# Patient Record
Sex: Male | Born: 1950 | Race: White | Hispanic: No | Marital: Single | Smoking: Former smoker
Health system: Southern US, Community
[De-identification: ages and names within clinical notes are randomized; demographics above are authoritative.]

## PROBLEM LIST (undated history)

## (undated) DIAGNOSIS — K449 Diaphragmatic hernia without obstruction or gangrene: Secondary | ICD-10-CM

## (undated) DIAGNOSIS — I639 Cerebral infarction, unspecified: Secondary | ICD-10-CM

## (undated) DIAGNOSIS — K219 Gastro-esophageal reflux disease without esophagitis: Secondary | ICD-10-CM

## (undated) HISTORY — PX: OTHER SURGICAL HISTORY: SHX169

## (undated) HISTORY — DX: Cerebral infarction, unspecified: I63.9

---

## 2009-03-26 ENCOUNTER — Emergency Department (HOSPITAL_COMMUNITY): Admission: EM | Admit: 2009-03-26 | Discharge: 2009-03-26 | Payer: Self-pay | Admitting: Emergency Medicine

## 2020-10-17 ENCOUNTER — Other Ambulatory Visit: Payer: Self-pay

## 2020-10-17 ENCOUNTER — Inpatient Hospital Stay: Payer: Medicare Other | Admitting: Anesthesiology

## 2020-10-17 ENCOUNTER — Inpatient Hospital Stay: Payer: Medicare Other

## 2020-10-17 ENCOUNTER — Encounter
Admission: EM | Disposition: A | Discharge status: Skilled Nursing Facility | Payer: Self-pay | Source: Home / Self Care | Attending: Student

## 2020-10-17 ENCOUNTER — Emergency Department: Payer: Medicare Other

## 2020-10-17 ENCOUNTER — Inpatient Hospital Stay
Admission: EM | Admit: 2020-10-17 | Discharge: 2020-10-22 | DRG: 481 | Disposition: A | Discharge status: Skilled Nursing Facility | Payer: Medicare Other | Attending: Student | Admitting: Student

## 2020-10-17 DIAGNOSIS — I248 Other forms of acute ischemic heart disease: Secondary | ICD-10-CM | POA: Diagnosis present

## 2020-10-17 DIAGNOSIS — M25552 Pain in left hip: Secondary | ICD-10-CM | POA: Diagnosis present

## 2020-10-17 DIAGNOSIS — D649 Anemia, unspecified: Secondary | ICD-10-CM | POA: Diagnosis present

## 2020-10-17 DIAGNOSIS — R7989 Other specified abnormal findings of blood chemistry: Secondary | ICD-10-CM | POA: Diagnosis present

## 2020-10-17 DIAGNOSIS — Z6828 Body mass index (BMI) 28.0-28.9, adult: Secondary | ICD-10-CM | POA: Diagnosis not present

## 2020-10-17 DIAGNOSIS — S72002A Fracture of unspecified part of neck of left femur, initial encounter for closed fracture: Secondary | ICD-10-CM | POA: Diagnosis present

## 2020-10-17 DIAGNOSIS — Y92524 Gas station as the place of occurrence of the external cause: Secondary | ICD-10-CM

## 2020-10-17 DIAGNOSIS — R778 Other specified abnormalities of plasma proteins: Secondary | ICD-10-CM | POA: Diagnosis present

## 2020-10-17 DIAGNOSIS — E663 Overweight: Secondary | ICD-10-CM | POA: Diagnosis present

## 2020-10-17 DIAGNOSIS — Z20822 Contact with and (suspected) exposure to covid-19: Secondary | ICD-10-CM | POA: Diagnosis present

## 2020-10-17 DIAGNOSIS — R030 Elevated blood-pressure reading, without diagnosis of hypertension: Secondary | ICD-10-CM | POA: Diagnosis present

## 2020-10-17 DIAGNOSIS — Z87891 Personal history of nicotine dependence: Secondary | ICD-10-CM

## 2020-10-17 DIAGNOSIS — Z419 Encounter for procedure for purposes other than remedying health state, unspecified: Secondary | ICD-10-CM

## 2020-10-17 DIAGNOSIS — R9431 Abnormal electrocardiogram [ECG] [EKG]: Secondary | ICD-10-CM | POA: Diagnosis present

## 2020-10-17 DIAGNOSIS — E559 Vitamin D deficiency, unspecified: Secondary | ICD-10-CM | POA: Diagnosis present

## 2020-10-17 DIAGNOSIS — W010XXA Fall on same level from slipping, tripping and stumbling without subsequent striking against object, initial encounter: Secondary | ICD-10-CM | POA: Diagnosis present

## 2020-10-17 DIAGNOSIS — S72142A Displaced intertrochanteric fracture of left femur, initial encounter for closed fracture: Principal | ICD-10-CM | POA: Diagnosis present

## 2020-10-17 DIAGNOSIS — K449 Diaphragmatic hernia without obstruction or gangrene: Secondary | ICD-10-CM | POA: Diagnosis present

## 2020-10-17 DIAGNOSIS — G4700 Insomnia, unspecified: Secondary | ICD-10-CM | POA: Diagnosis not present

## 2020-10-17 DIAGNOSIS — K219 Gastro-esophageal reflux disease without esophagitis: Secondary | ICD-10-CM | POA: Diagnosis present

## 2020-10-17 DIAGNOSIS — W19XXXA Unspecified fall, initial encounter: Secondary | ICD-10-CM | POA: Diagnosis present

## 2020-10-17 HISTORY — PX: INTRAMEDULLARY (IM) NAIL INTERTROCHANTERIC: SHX5875

## 2020-10-17 HISTORY — DX: Diaphragmatic hernia without obstruction or gangrene: K44.9

## 2020-10-17 HISTORY — DX: Gastro-esophageal reflux disease without esophagitis: K21.9

## 2020-10-17 LAB — COMPREHENSIVE METABOLIC PANEL
ALT: 13 U/L (ref 0–44)
AST: 19 U/L (ref 15–41)
Albumin: 3.8 g/dL (ref 3.5–5.0)
Alkaline Phosphatase: 54 U/L (ref 38–126)
Anion gap: 7 (ref 5–15)
BUN: 18 mg/dL (ref 8–23)
CO2: 23 mmol/L (ref 22–32)
Calcium: 8.8 mg/dL — ABNORMAL LOW (ref 8.9–10.3)
Chloride: 109 mmol/L (ref 98–111)
Creatinine, Ser: 1.02 mg/dL (ref 0.61–1.24)
GFR, Estimated: >60 mL/min (ref >60)
Glucose, Bld: 147 mg/dL — ABNORMAL HIGH (ref 70–99)
Potassium: 3.6 mmol/L (ref 3.5–5.1)
Sodium: 139 mmol/L (ref 135–145)
Total Bilirubin: 0.9 mg/dL (ref 0.3–1.2)
Total Protein: 7.1 g/dL (ref 6.5–8.1)

## 2020-10-17 LAB — CBC WITH DIFFERENTIAL/PLATELET
Abs Immature Granulocytes: 0.02 10*3/uL (ref 0.00–0.07)
Basophils Absolute: 0 10*3/uL (ref 0.0–0.1)
Basophils Relative: 0 %
Eosinophils Absolute: 0 10*3/uL (ref 0.0–0.5)
Eosinophils Relative: 1 %
HCT: 33.7 % — ABNORMAL LOW (ref 39.0–52.0)
Hemoglobin: 10.2 g/dL — ABNORMAL LOW (ref 13.0–17.0)
Immature Granulocytes: 0 %
Lymphocytes Relative: 22 %
Lymphs Abs: 1.4 10*3/uL (ref 0.7–4.0)
MCH: 24.1 pg — ABNORMAL LOW (ref 26.0–34.0)
MCHC: 30.3 g/dL (ref 30.0–36.0)
MCV: 79.7 fL — ABNORMAL LOW (ref 80.0–100.0)
Monocytes Absolute: 0.4 10*3/uL (ref 0.1–1.0)
Monocytes Relative: 5 %
Neutro Abs: 4.8 10*3/uL (ref 1.7–7.7)
Neutrophils Relative %: 72 %
Platelets: 378 10*3/uL (ref 150–400)
RBC: 4.23 MIL/uL (ref 4.22–5.81)
RDW: 16.5 % — ABNORMAL HIGH (ref 11.5–15.5)
WBC: 6.6 10*3/uL (ref 4.0–10.5)
nRBC: 0 % (ref 0.0–0.2)

## 2020-10-17 LAB — URINALYSIS, COMPLETE (UACMP) WITH MICROSCOPIC
Bacteria, UA: NONE SEEN
Bilirubin Urine: NEGATIVE
Glucose, UA: NEGATIVE mg/dL
Hgb urine dipstick: NEGATIVE
Ketones, ur: NEGATIVE mg/dL
Leukocytes,Ua: NEGATIVE
Nitrite: NEGATIVE
Protein, ur: NEGATIVE mg/dL
Specific Gravity, Urine: 1.023 (ref 1.005–1.030)
pH: 5 (ref 5.0–8.0)

## 2020-10-17 LAB — RETICULOCYTES
Immature Retic Fract: 9.9 % (ref 2.3–15.9)
RBC.: 5.1 MIL/uL (ref 4.22–5.81)
Retic Count, Absolute: 78.3 10*3/uL (ref 19.0–186.0)
Retic Ct Pct: 1.5 % (ref 0.4–3.1)

## 2020-10-17 LAB — TROPONIN I (HIGH SENSITIVITY)
Troponin I (High Sensitivity): 32 ng/L — ABNORMAL HIGH (ref <18)
Troponin I (High Sensitivity): 33 ng/L — ABNORMAL HIGH (ref <18)

## 2020-10-17 LAB — IRON AND TIBC
Iron: 45 ug/dL (ref 45–182)
Saturation Ratios: 16 % — ABNORMAL LOW (ref 17.9–39.5)
TIBC: 281 ug/dL (ref 250–450)
UIBC: 236 ug/dL

## 2020-10-17 LAB — FOLATE: Folate: 10.4 ng/mL (ref >5.9)

## 2020-10-17 LAB — RESP PANEL BY RT-PCR (FLU A&B, COVID) ARPGX2
Influenza A by PCR: NEGATIVE
Influenza B by PCR: NEGATIVE
SARS Coronavirus 2 by RT PCR: NEGATIVE

## 2020-10-17 LAB — SAMPLE TO BLOOD BANK

## 2020-10-17 LAB — PROTIME-INR
INR: 1.1 (ref 0.8–1.2)
Prothrombin Time: 13.3 seconds (ref 11.4–15.2)

## 2020-10-17 LAB — CREATININE, SERUM
Creatinine, Ser: 1 mg/dL (ref 0.61–1.24)
GFR, Estimated: >60 mL/min

## 2020-10-17 LAB — FERRITIN: Ferritin: 168 ng/mL (ref 24–336)

## 2020-10-17 LAB — MAGNESIUM: Magnesium: 2.3 mg/dL (ref 1.7–2.4)

## 2020-10-17 IMAGING — CR DG HIP (WITH OR WITHOUT PELVIS) 2-3V*L*
1 series · 3 of 3 positions shown · non-contrast
Comparison: None.

CLINICAL DATA: Fall, left hip pain

EXAM:
DG HIP (WITH OR WITHOUT PELVIS) 2-3V LEFT

[Series 1: dg hip unilat w or w/o pelvis 2-3 views  · non-contrast · 0.14mm/px · 3 of 3 slices shown]
[im 1/3]
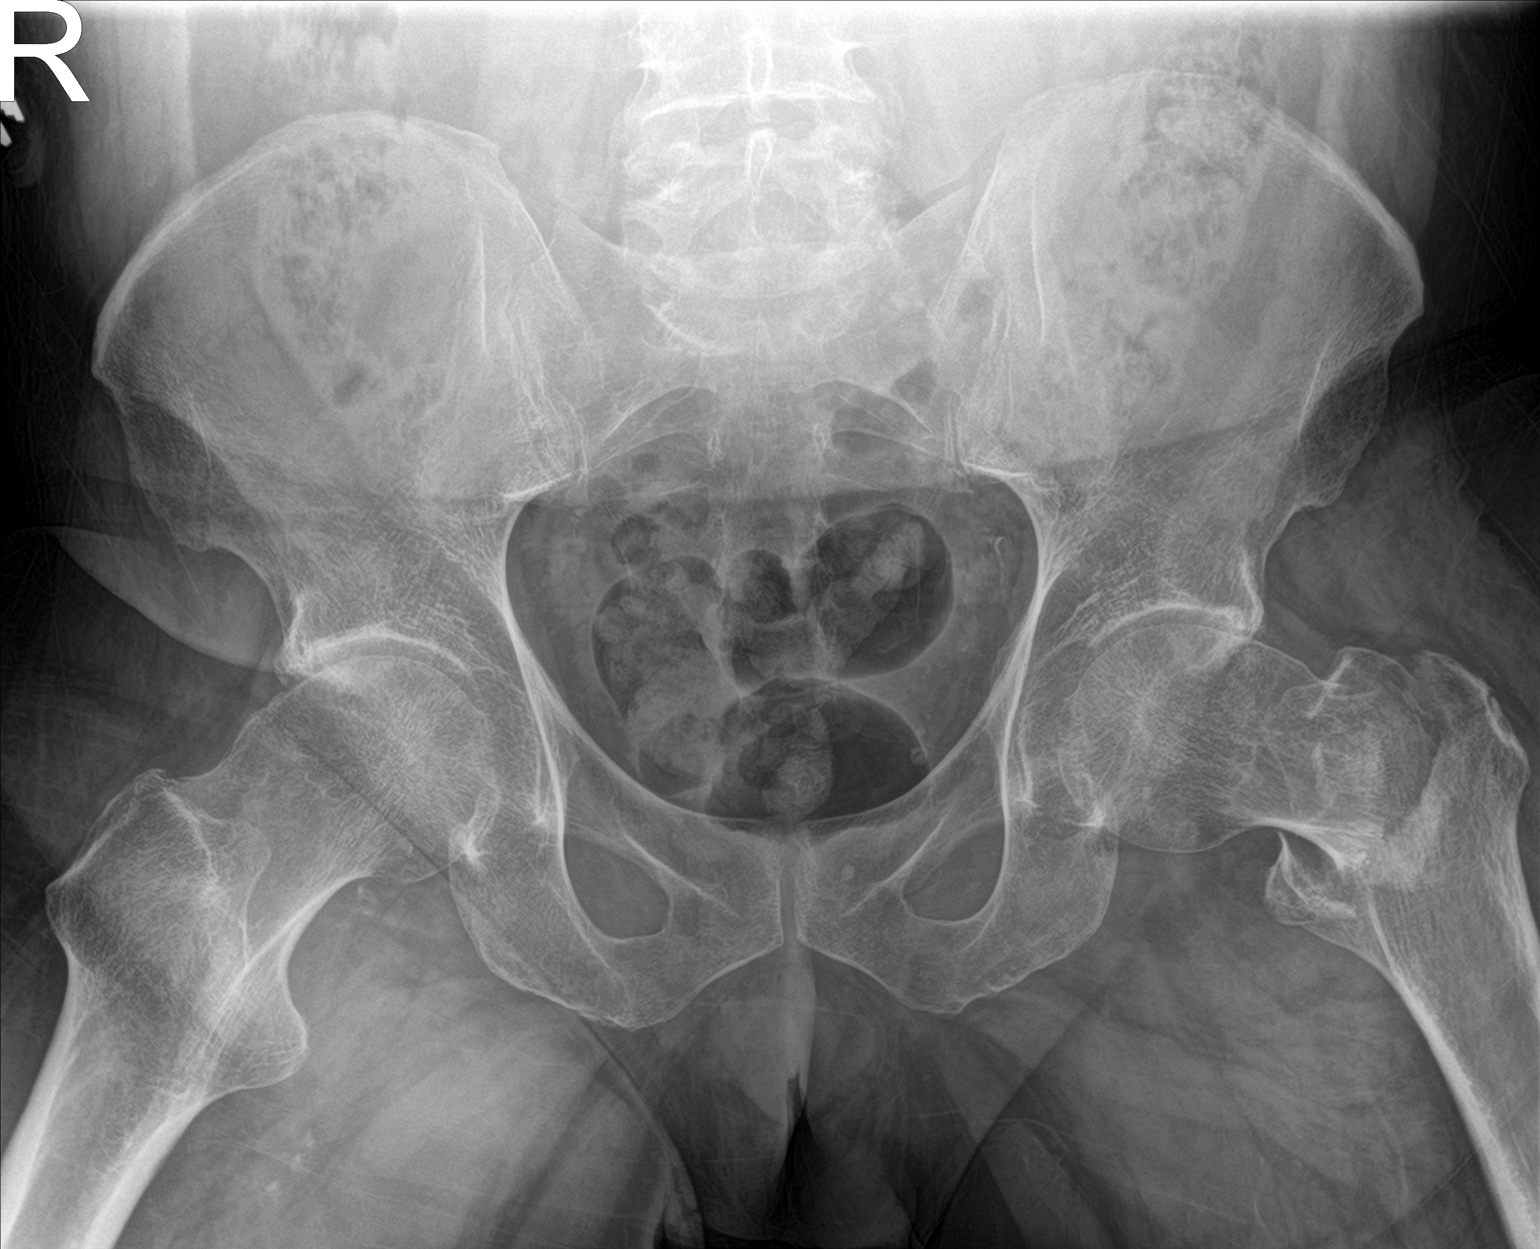
[im 2/3]
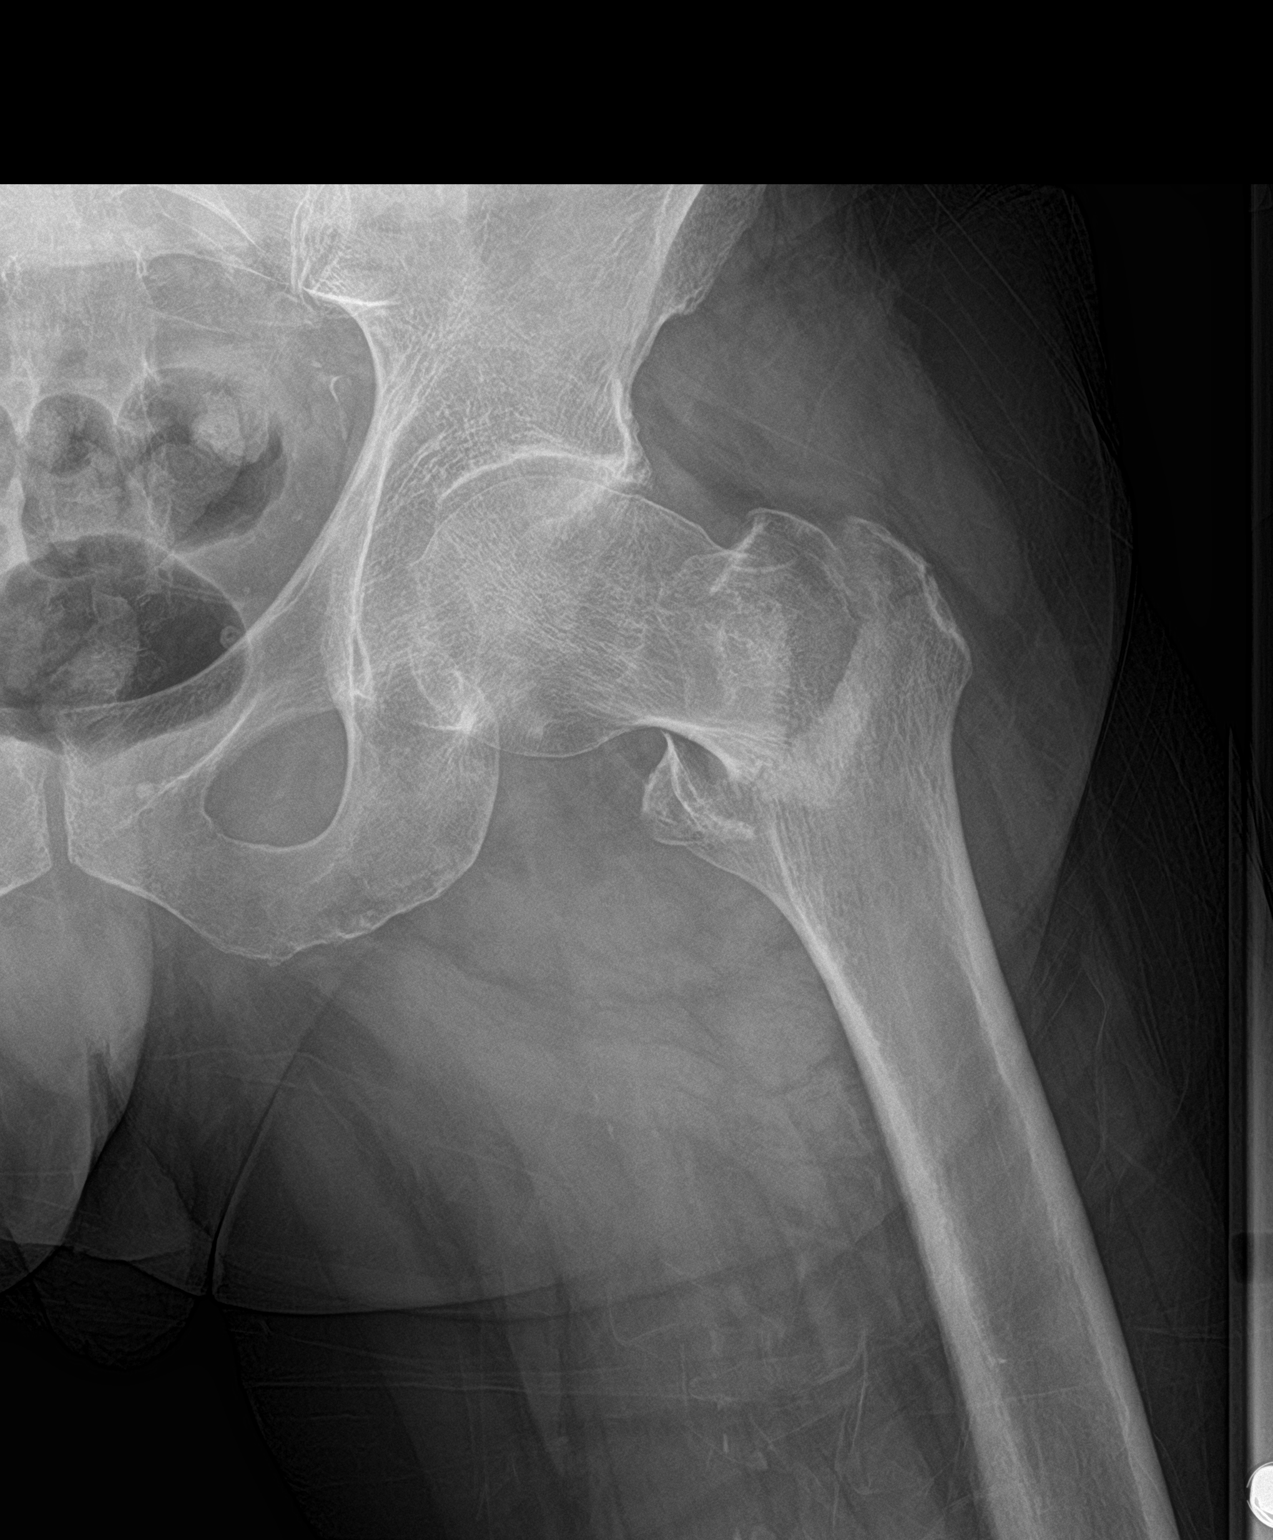
[im 3/3]
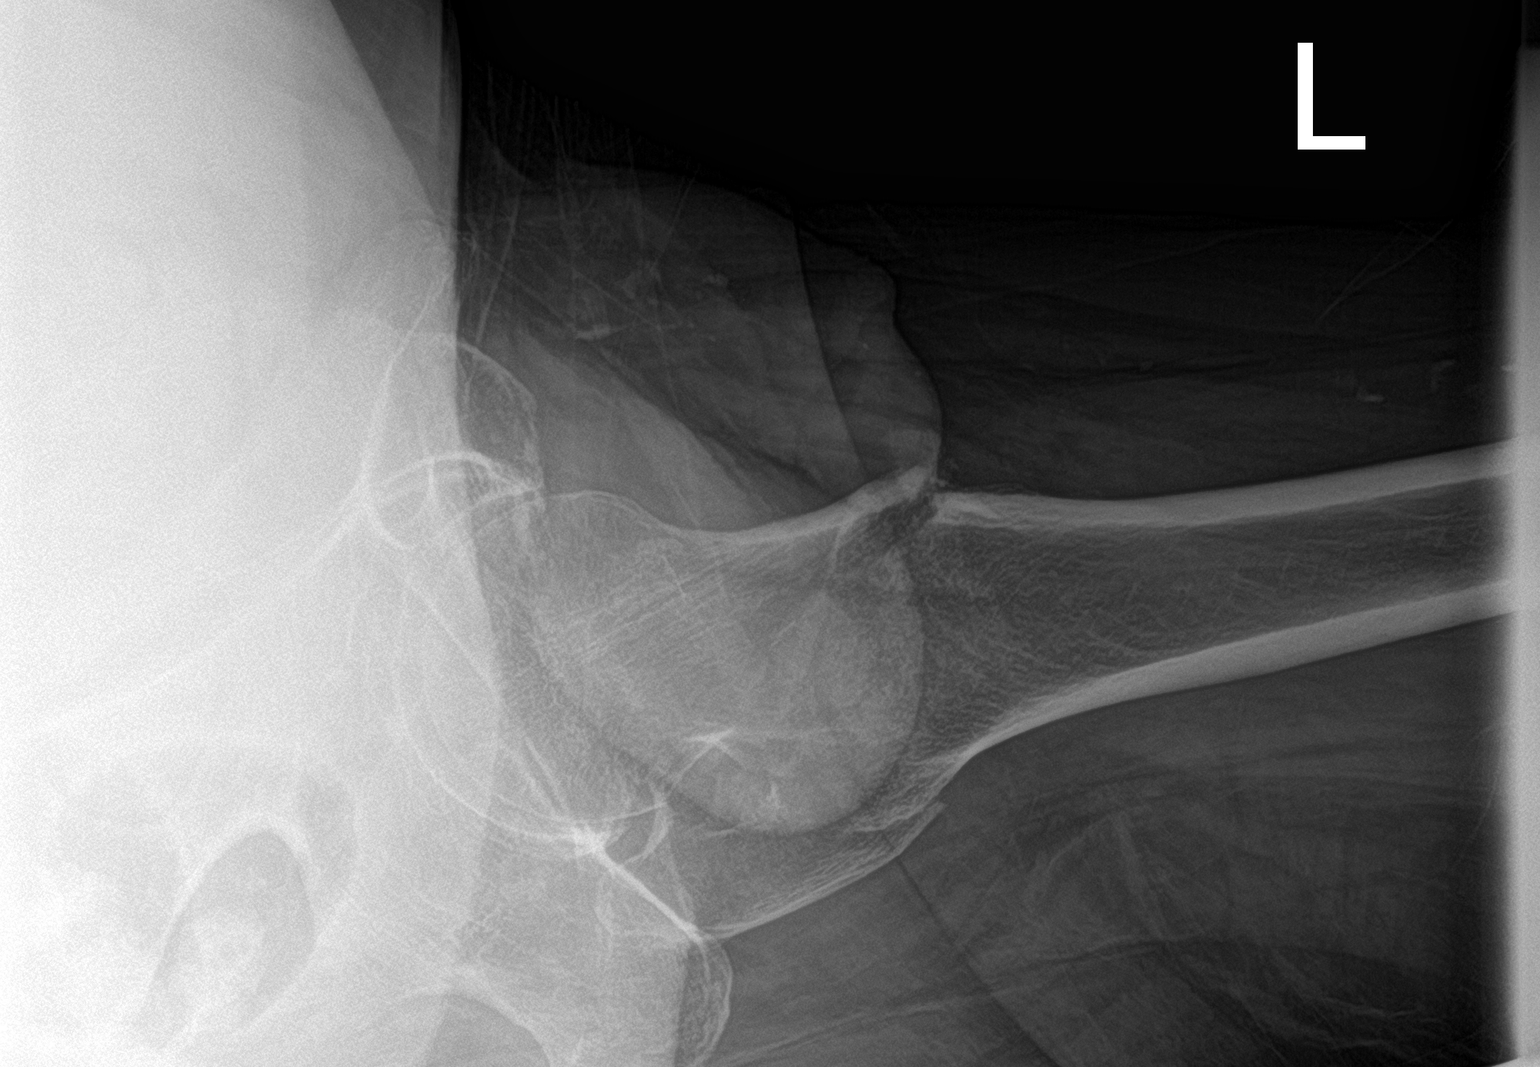

[3 of 3 positions shown; findings below may reference images not displayed]

FINDINGS: There is a left femoral intertrochanteric fracture with varus
angulation. No subluxation or dislocation. Mild degenerative changes
in the hip joints bilaterally.
IMPRESSION: Mildly angulated left femoral intertrochanteric fracture.

## 2020-10-17 IMAGING — RF DG C-ARM 1-60 MIN
1 series · 3 of 3 positions shown · non-contrast
Comparison: None.

CLINICAL DATA: LEFT IM nail

EXAM:
LEFT FEMUR 2 VIEWS; DG C-ARM 1-60 MIN

[Series 1: unknown protocol · 0.14mm/px · 3 of 3 slices shown]
[im 1/3]
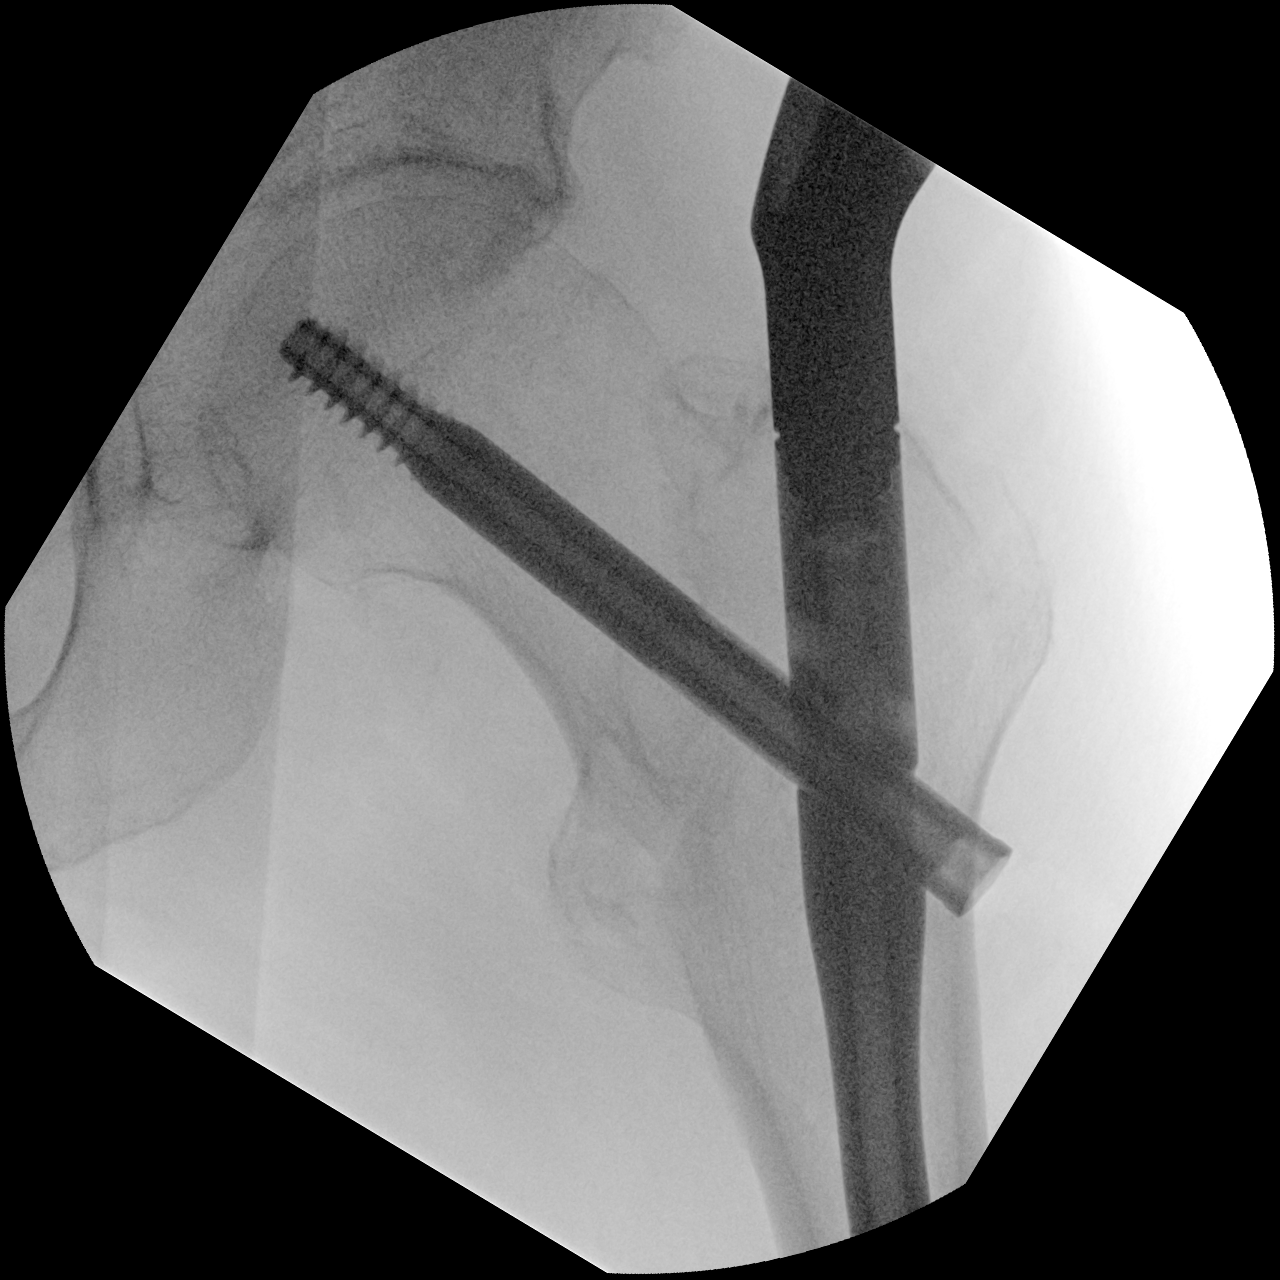
[im 2/3]
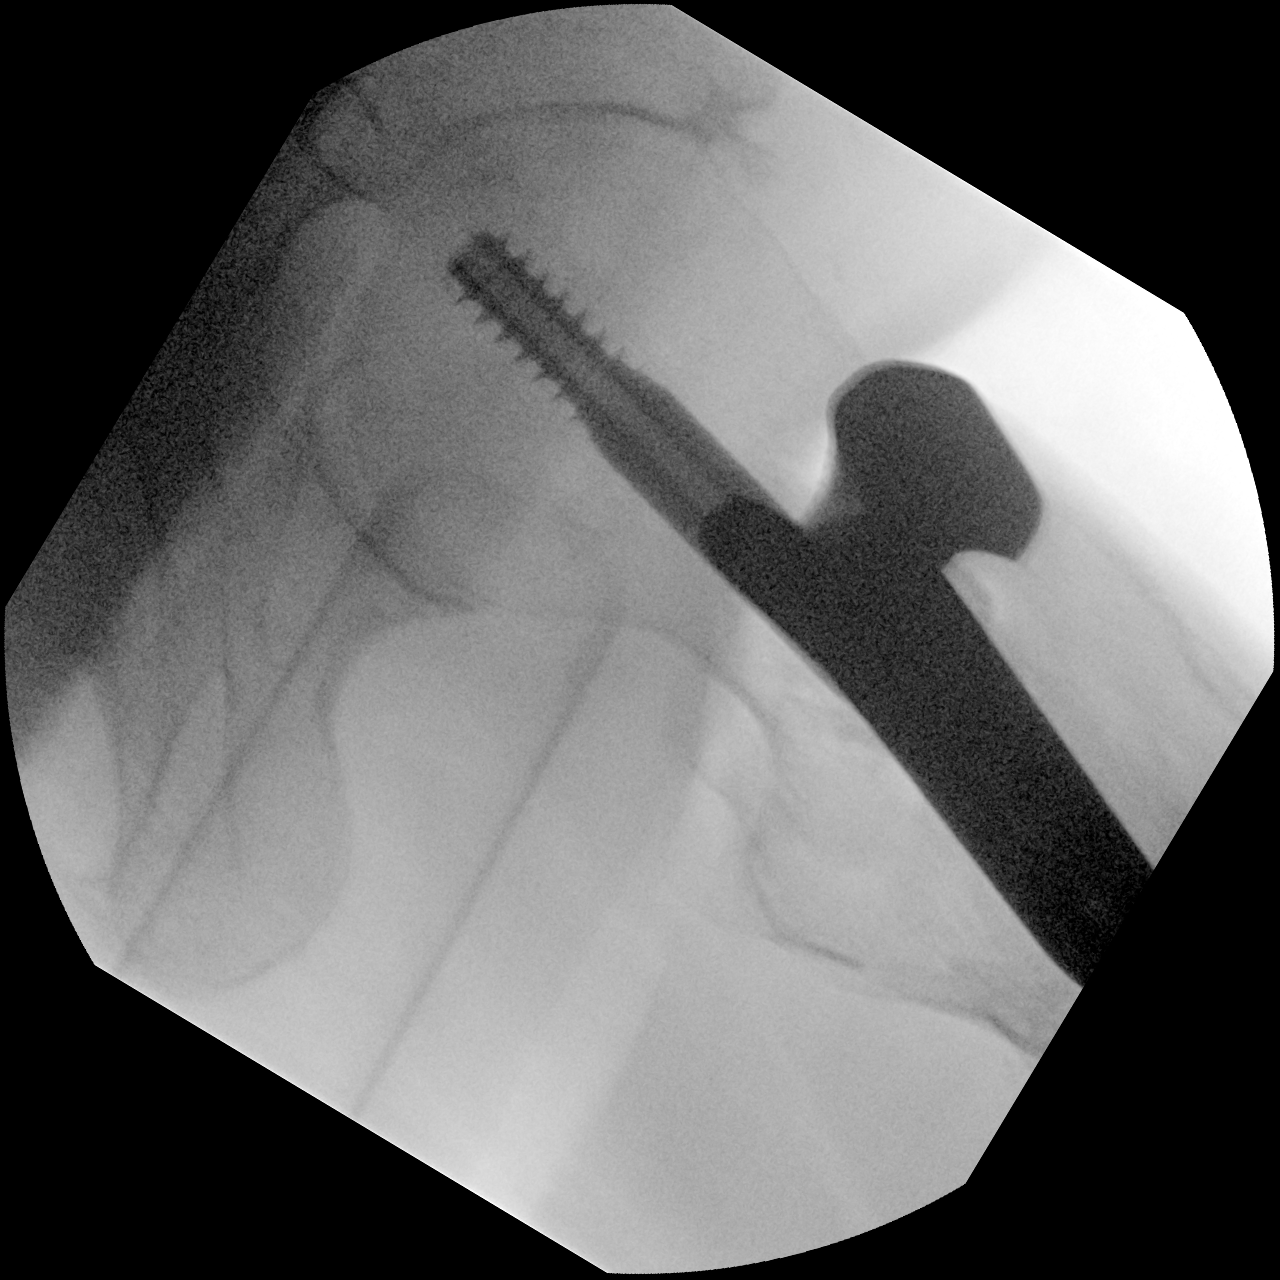
[im 3/3]
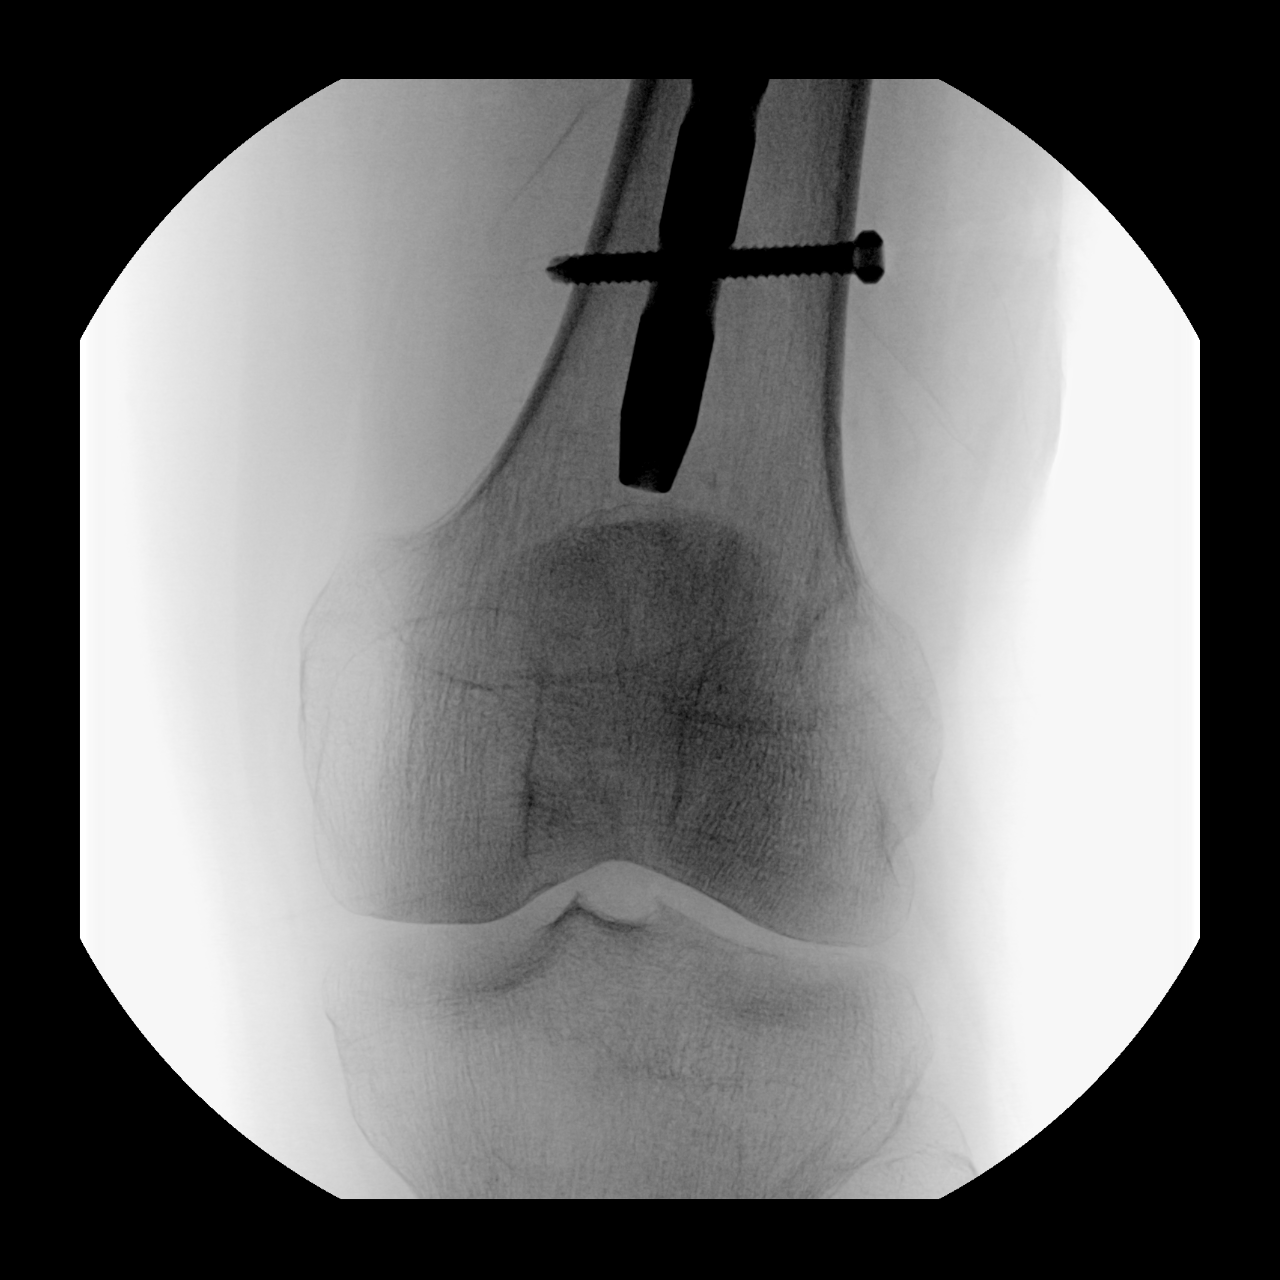

[3 of 3 positions shown; findings below may reference images not displayed]

FINDINGS: Intraoperative fluoroscopic spot images of the LEFT femur are
provided. IM nail appears appropriately positioned. Fluoroscopy
provided for 45 seconds.
IMPRESSION: Intraoperative fluoroscopic spot images demonstrating placement of
an intramedullary nail. No evidence of surgical complicating
feature.

## 2020-10-17 IMAGING — RF DG FEMUR 2+V*L*
1 series · 3 of 3 positions shown · non-contrast
Comparison: None.

CLINICAL DATA: LEFT IM nail

EXAM:
LEFT FEMUR 2 VIEWS; DG C-ARM 1-60 MIN

[Series 1: unknown protocol · 0.14mm/px · 3 of 3 slices shown]
[im 1/3]
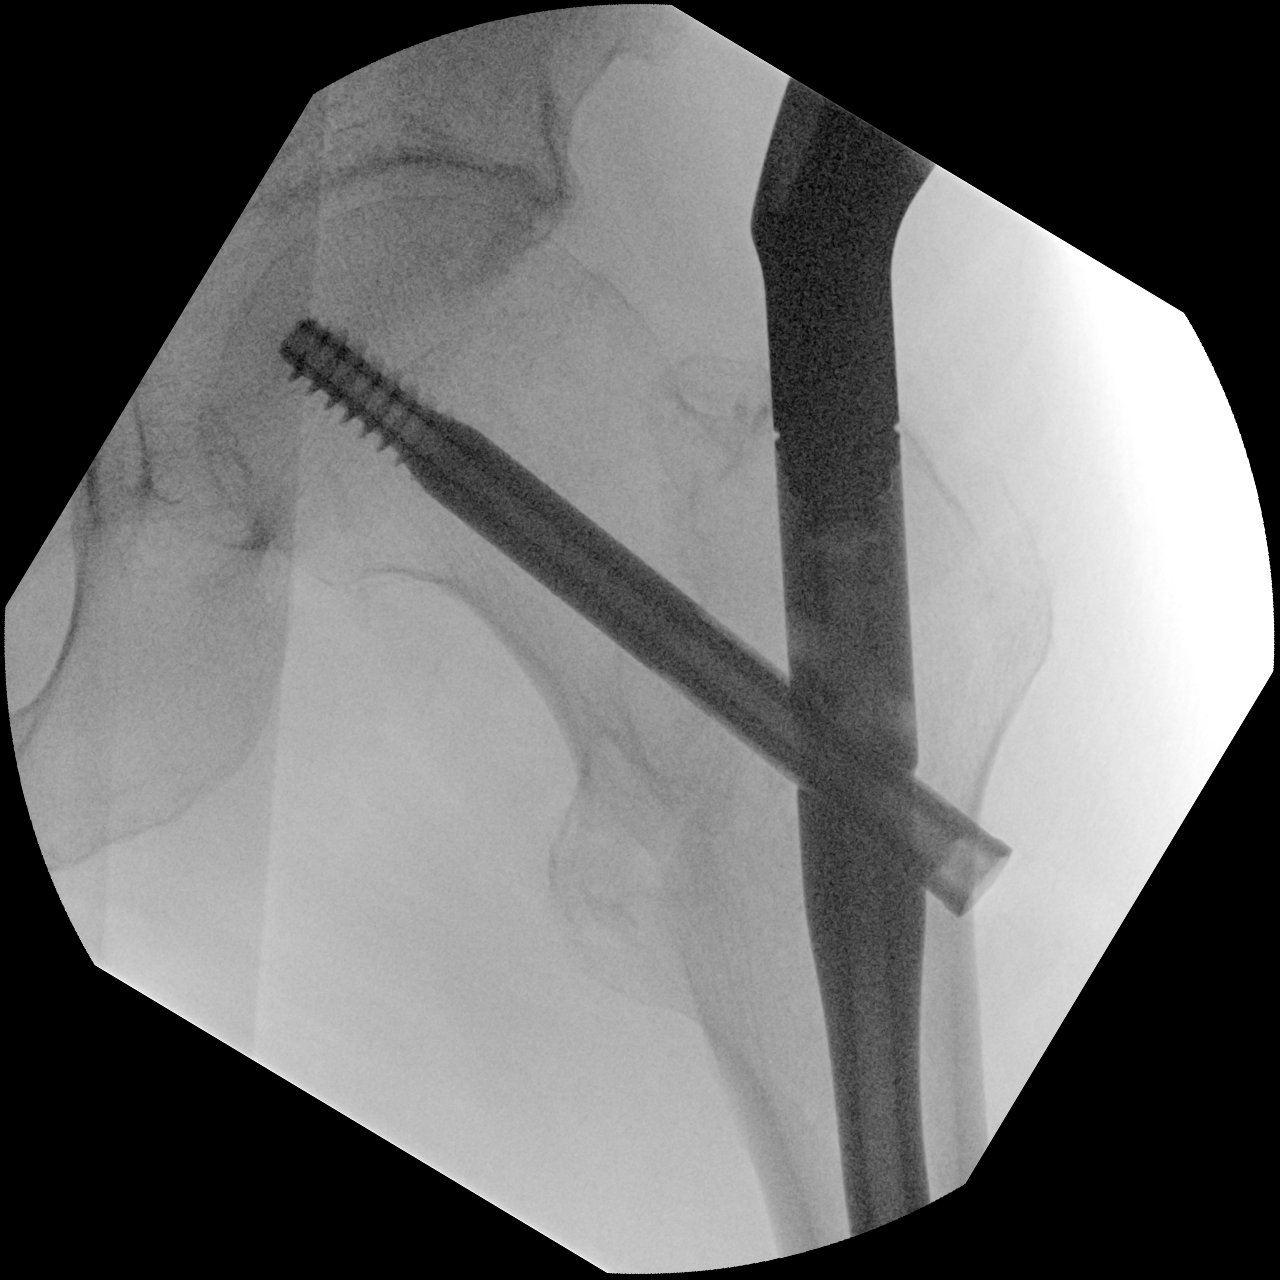
[im 2/3]
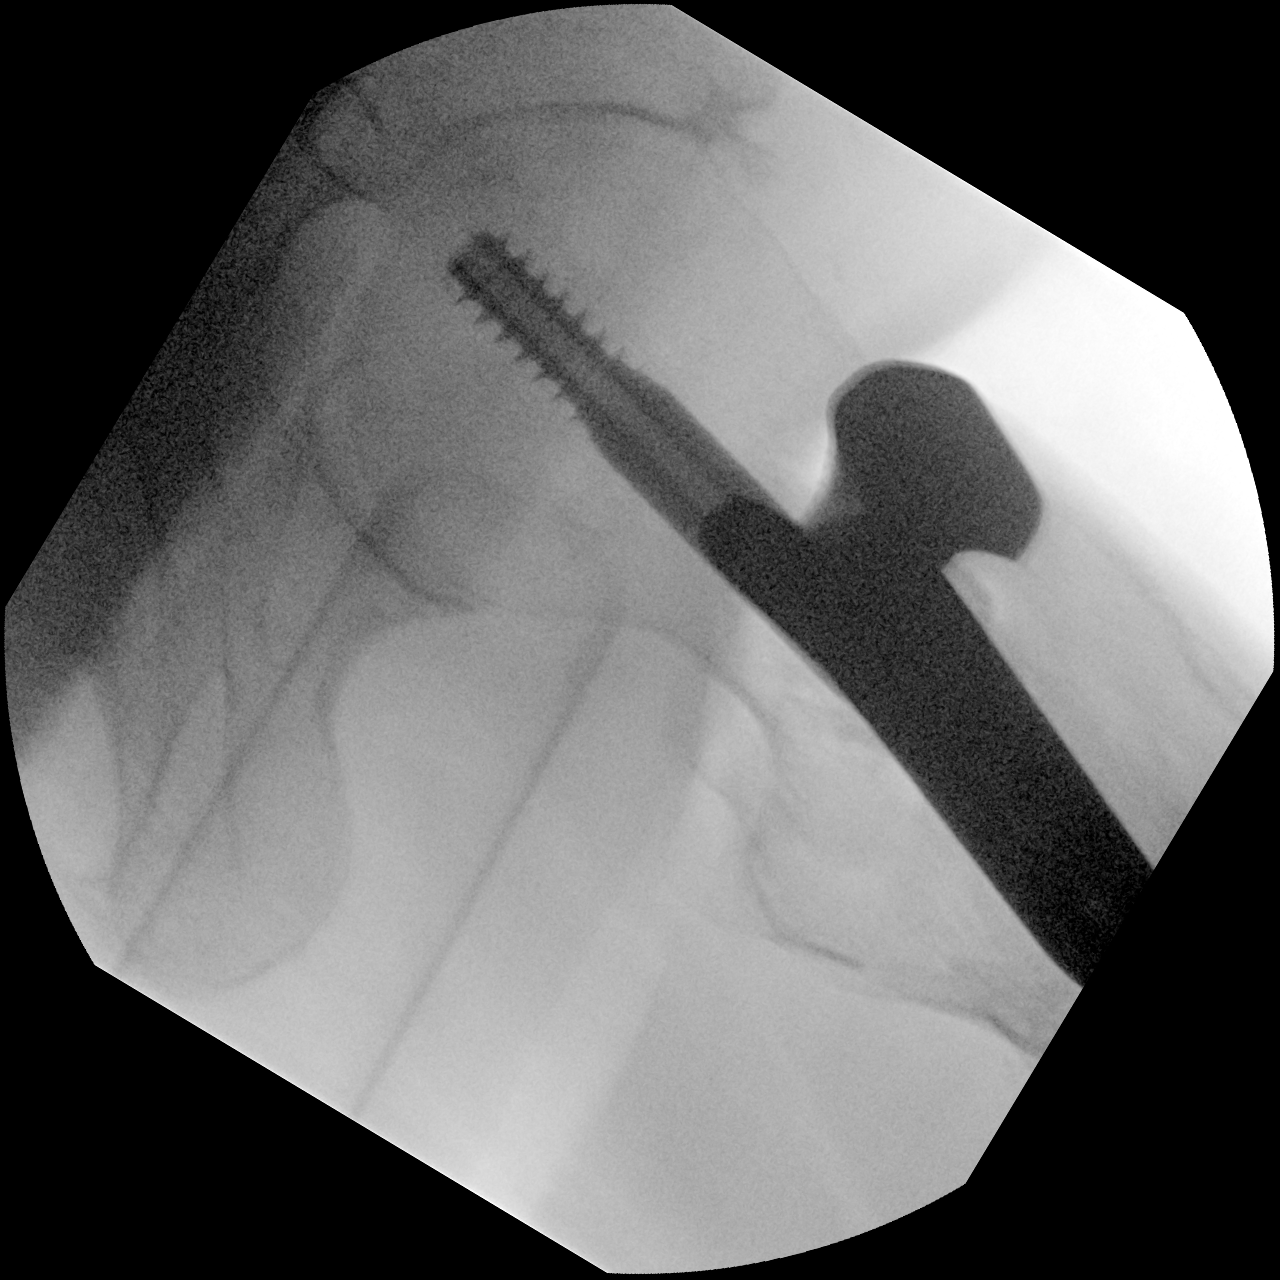
[im 3/3]
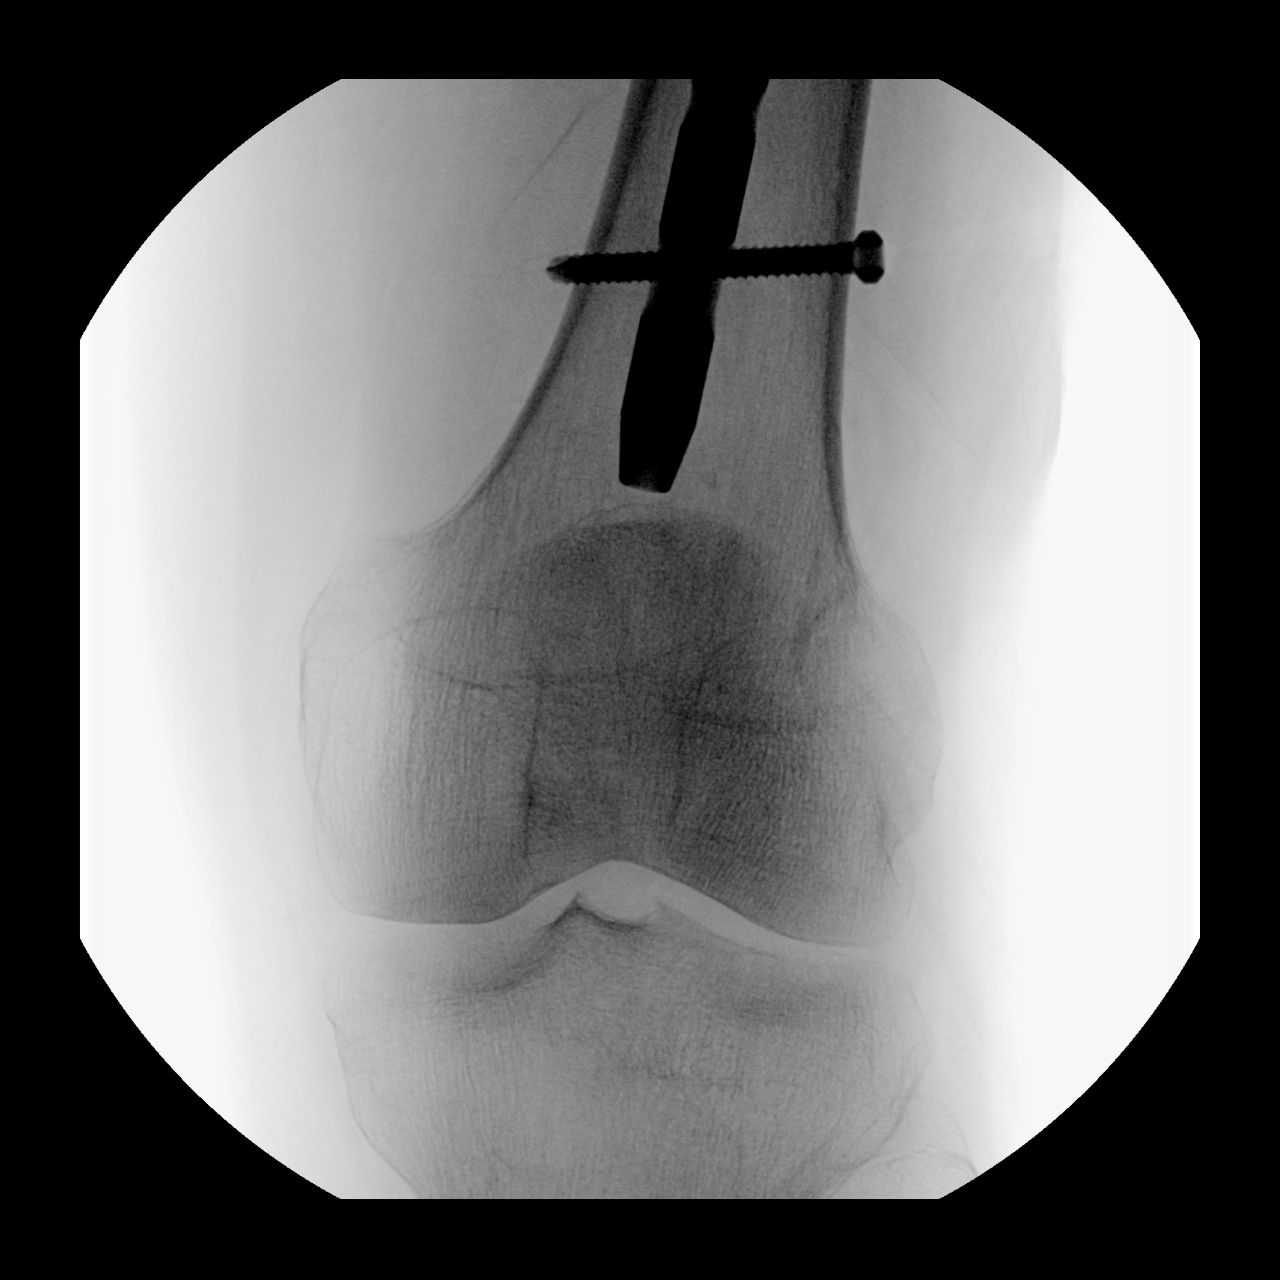

[3 of 3 positions shown; findings below may reference images not displayed]

FINDINGS: Intraoperative fluoroscopic spot images of the LEFT femur are
provided. IM nail appears appropriately positioned. Fluoroscopy
provided for 45 seconds.
IMPRESSION: Intraoperative fluoroscopic spot images demonstrating placement of
an intramedullary nail. No evidence of surgical complicating
feature.

## 2020-10-17 SURGERY — FIXATION, FRACTURE, INTERTROCHANTERIC, WITH INTRAMEDULLARY ROD
Anesthesia: Spinal | Site: Hip | Laterality: Left

## 2020-10-17 MED ORDER — HYDROMORPHONE HCL 1 MG/ML IJ SOLN
0.5000 mg | Freq: Once | INTRAMUSCULAR | Status: AC
  Administered 2020-10-17: 0.5 mg via INTRAVENOUS

## 2020-10-17 MED ORDER — ONDANSETRON HCL 4 MG/2ML IJ SOLN
4.0000 mg | Freq: Once | INTRAMUSCULAR | Status: AC
  Administered 2020-10-17: 4 mg via INTRAVENOUS
  Filled 2020-10-17: qty 2

## 2020-10-17 MED ORDER — ZOLPIDEM TARTRATE 5 MG PO TABS
5.0000 mg | ORAL_TABLET | Freq: Every evening | ORAL | Status: DC | PRN
  Administered 2020-10-18 – 2020-10-21 (×5): 5 mg via ORAL
  Filled 2020-10-17 (×5): qty 1

## 2020-10-17 MED ORDER — METHOCARBAMOL 500 MG PO TABS: 500.0000 mg | ORAL_TABLET | Freq: Four times a day (QID) | ORAL | Status: DC | PRN

## 2020-10-17 MED ORDER — FENTANYL CITRATE (PF) 100 MCG/2ML IJ SOLN: 25.0000 ug | INTRAMUSCULAR | Status: DC | PRN

## 2020-10-17 MED ORDER — PROPOFOL 500 MG/50ML IV EMUL
INTRAVENOUS | Status: DC | PRN
  Administered 2020-10-17: 50 ug/kg/min via INTRAVENOUS

## 2020-10-17 MED ORDER — MAGNESIUM CITRATE PO SOLN
1.0000 | Freq: Once | ORAL | Status: DC | PRN
  Filled 2020-10-17: qty 296

## 2020-10-17 MED ORDER — MIDAZOLAM HCL 2 MG/2ML IJ SOLN
INTRAMUSCULAR | Status: AC
  Filled 2020-10-17: qty 2

## 2020-10-17 MED ORDER — BISACODYL 10 MG RE SUPP: 10.0000 mg | Freq: Every day | RECTAL | Status: DC | PRN

## 2020-10-17 MED ORDER — MENTHOL 3 MG MT LOZG
1.0000 | LOZENGE | OROMUCOSAL | Status: DC | PRN
  Filled 2020-10-17: qty 9

## 2020-10-17 MED ORDER — DOCUSATE SODIUM 100 MG PO CAPS
100.0000 mg | ORAL_CAPSULE | Freq: Two times a day (BID) | ORAL | Status: DC
  Administered 2020-10-17 – 2020-10-20 (×7): 100 mg via ORAL
  Filled 2020-10-17 (×8): qty 1

## 2020-10-17 MED ORDER — MORPHINE SULFATE (PF) 2 MG/ML IV SOLN: 0.5000 mg | INTRAVENOUS | Status: DC | PRN

## 2020-10-17 MED ORDER — BUPIVACAINE HCL (PF) 0.5 % IJ SOLN
INTRAMUSCULAR | Status: DC | PRN
  Administered 2020-10-17: 2.5 mL

## 2020-10-17 MED ORDER — FENTANYL CITRATE (PF) 100 MCG/2ML IJ SOLN
25.0000 ug | INTRAMUSCULAR | Status: DC | PRN
  Administered 2020-10-17: 25 ug via INTRAVENOUS
  Filled 2020-10-17: qty 2

## 2020-10-17 MED ORDER — CEFAZOLIN SODIUM-DEXTROSE 2-4 GM/100ML-% IV SOLN
INTRAVENOUS | Status: AC
  Filled 2020-10-17: qty 100

## 2020-10-17 MED ORDER — ALUM & MAG HYDROXIDE-SIMETH 200-200-20 MG/5ML PO SUSP: 30.0000 mL | ORAL | Status: DC | PRN

## 2020-10-17 MED ORDER — SODIUM CHLORIDE 0.9 % IV SOLN: 12.5000 mg/h | INTRAVENOUS | Status: DC | PRN

## 2020-10-17 MED ORDER — PANTOPRAZOLE SODIUM 40 MG IV SOLR
40.0000 mg | INTRAVENOUS | Status: DC
  Administered 2020-10-18: 40 mg via INTRAVENOUS
  Filled 2020-10-17: qty 40

## 2020-10-17 MED ORDER — HYDROMORPHONE HCL 1 MG/ML IJ SOLN
0.5000 mg | Freq: Once | INTRAMUSCULAR | Status: DC
  Filled 2020-10-17: qty 1

## 2020-10-17 MED ORDER — LACTATED RINGERS IV SOLN: INTRAVENOUS | Status: DC | PRN

## 2020-10-17 MED ORDER — METHOCARBAMOL 1000 MG/10ML IJ SOLN
500.0000 mg | Freq: Four times a day (QID) | INTRAVENOUS | Status: DC | PRN
  Filled 2020-10-17: qty 5

## 2020-10-17 MED ORDER — HYDROCODONE-ACETAMINOPHEN 5-325 MG PO TABS
1.0000 | ORAL_TABLET | ORAL | Status: DC | PRN
  Administered 2020-10-18 (×3): 1 via ORAL
  Filled 2020-10-17 (×4): qty 1

## 2020-10-17 MED ORDER — PHENOL 1.4 % MT LIQD
1.0000 | OROMUCOSAL | Status: DC | PRN
  Filled 2020-10-17: qty 177

## 2020-10-17 MED ORDER — SODIUM CHLORIDE 0.9 % IV SOLN: INTRAVENOUS | Status: DC

## 2020-10-17 MED ORDER — SODIUM CHLORIDE 0.9 % IV SOLN: INTRAVENOUS | Status: AC

## 2020-10-17 MED ORDER — ENOXAPARIN SODIUM 40 MG/0.4ML ~~LOC~~ SOLN
40.0000 mg | SUBCUTANEOUS | Status: DC
  Administered 2020-10-18 – 2020-10-22 (×5): 40 mg via SUBCUTANEOUS
  Filled 2020-10-17 (×5): qty 0.4

## 2020-10-17 MED ORDER — HYDROCODONE-ACETAMINOPHEN 7.5-325 MG PO TABS
1.0000 | ORAL_TABLET | ORAL | Status: DC | PRN
  Administered 2020-10-21 – 2020-10-22 (×2): 1 via ORAL
  Filled 2020-10-17 (×2): qty 1

## 2020-10-17 MED ORDER — NEOMYCIN-POLYMYXIN B GU 40-200000 IR SOLN
Status: DC | PRN
  Administered 2020-10-17: 2 mL

## 2020-10-17 MED ORDER — MAGNESIUM HYDROXIDE 400 MG/5ML PO SUSP: 30.0000 mL | Freq: Every day | ORAL | Status: DC | PRN

## 2020-10-17 MED ORDER — LORAZEPAM 2 MG/ML IJ SOLN: 0.5000 mg | Freq: Four times a day (QID) | INTRAMUSCULAR | Status: DC | PRN

## 2020-10-17 MED ORDER — PROPOFOL 10 MG/ML IV BOLUS
INTRAVENOUS | Status: DC | PRN
  Administered 2020-10-17: 20 mg via INTRAVENOUS

## 2020-10-17 MED ORDER — CEFAZOLIN SODIUM-DEXTROSE 2-4 GM/100ML-% IV SOLN
2.0000 g | Freq: Once | INTRAVENOUS | Status: AC
  Administered 2020-10-17: 2 g via INTRAVENOUS
  Filled 2020-10-17: qty 100

## 2020-10-17 MED ORDER — PROPOFOL 500 MG/50ML IV EMUL
INTRAVENOUS | Status: AC
  Filled 2020-10-17: qty 50

## 2020-10-17 MED ORDER — SODIUM CHLORIDE 0.9 % IV SOLN
INTRAVENOUS | Status: DC | PRN
  Administered 2020-10-17: 50 ug/min via INTRAVENOUS

## 2020-10-17 MED ORDER — CEFAZOLIN SODIUM-DEXTROSE 2-4 GM/100ML-% IV SOLN
2.0000 g | Freq: Four times a day (QID) | INTRAVENOUS | Status: AC
Start: 2020-10-17 — End: 2020-10-18
  Administered 2020-10-17 – 2020-10-18 (×3): 2 g via INTRAVENOUS
  Filled 2020-10-17 (×3): qty 100

## 2020-10-17 MED ORDER — ACETAMINOPHEN 325 MG PO TABS
325.0000 mg | ORAL_TABLET | Freq: Four times a day (QID) | ORAL | Status: DC | PRN
  Administered 2020-10-20: 650 mg via ORAL
  Filled 2020-10-17 (×2): qty 2

## 2020-10-17 MED ORDER — MIDAZOLAM HCL 5 MG/5ML IJ SOLN
INTRAMUSCULAR | Status: DC | PRN
  Administered 2020-10-17: 2 mg via INTRAVENOUS

## 2020-10-17 SURGICAL SUPPLY — 33 items
BIT DRILL 4.3MMS DISTAL GRDTED (BIT) ×1 IMPLANT
BNDG COHESIVE 4X5 TAN STRL (GAUZE/BANDAGES/DRESSINGS) ×4 IMPLANT
CHLORAPREP W/TINT 26 (MISCELLANEOUS) ×2 IMPLANT
COVER WAND RF STERILE (DRAPES) ×2 IMPLANT
DRAPE 3/4 80X56 (DRAPES) ×2 IMPLANT
DRAPE U-SHAPE 47X51 STRL (DRAPES) ×2 IMPLANT
DRILL 4.3MMS DISTAL GRADUATED (BIT) ×2
DRSG OPSITE POSTOP 3X4 (GAUZE/BANDAGES/DRESSINGS) ×4 IMPLANT
DRSG OPSITE POSTOP 4X6 (GAUZE/BANDAGES/DRESSINGS) ×2 IMPLANT
GLOVE SURG SYN 9.0  PF PI (GLOVE) ×1
GLOVE SURG SYN 9.0 PF PI (GLOVE) ×1 IMPLANT
GLOVE SURG UNDER POLY LF SZ9 (GLOVE) ×2 IMPLANT
GOWN SRG 2XL LVL 4 RGLN SLV (GOWNS) ×1 IMPLANT
GOWN STRL NON-REIN 2XL LVL4 (GOWNS) ×1
GOWN STRL REUS W/ TWL LRG LVL3 (GOWN DISPOSABLE) ×1 IMPLANT
GOWN STRL REUS W/TWL LRG LVL3 (GOWN DISPOSABLE) ×1
GUIDEPIN VERSANAIL DSP 3.2X444 (ORTHOPEDIC DISPOSABLE SUPPLIES) ×2 IMPLANT
GUIDEWIRE BALL NOSE 100CM (WIRE) ×2 IMPLANT
HFN LH 130 DEG 11MM X 380MM (Orthopedic Implant) ×2 IMPLANT
KIT TURNOVER KIT A (KITS) ×2 IMPLANT
MANIFOLD NEPTUNE II (INSTRUMENTS) ×2 IMPLANT
MAT ABSORB  FLUID 56X50 GRAY (MISCELLANEOUS) ×1
MAT ABSORB FLUID 56X50 GRAY (MISCELLANEOUS) ×1 IMPLANT
NEEDLE FILTER BLUNT 18X 1/2SAF (NEEDLE) ×1
NEEDLE FILTER BLUNT 18X1 1/2 (NEEDLE) ×1 IMPLANT
NS IRRIG 500ML POUR BTL (IV SOLUTION) ×2 IMPLANT
PACK HIP COMPR (MISCELLANEOUS) ×2 IMPLANT
SCALPEL PROTECTED #15 DISP (BLADE) ×4 IMPLANT
STAPLER SKIN PROX 35W (STAPLE) ×2 IMPLANT
SUT VIC AB 1 CT1 36 (SUTURE) ×2 IMPLANT
SUT VIC AB 2-0 CT1 (SUTURE) ×2 IMPLANT
SYR 10ML LL (SYRINGE) ×2 IMPLANT
SYR BULB IRRIG 60ML STRL (SYRINGE) ×2 IMPLANT

## 2020-10-17 NOTE — Progress Notes (Signed)
Spinal is slow to wear off. Has moved down to L5 with sensation to upper thigh. Has been in recovery over 2 hours. Dr Pernell Dupre notified and is okay with transfer back to floor room.

## 2020-10-17 NOTE — Anesthesia Procedure Notes (Signed)
Spinal  Patient location during procedure: OR Start time: 10/17/2020 5:06 PM End time: 10/17/2020 5:14 PM Staffing Performed: resident/CRNA  Resident/CRNA: Nelda Marseille, CRNA Preanesthetic Checklist Completed: patient identified, IV checked, site marked, risks and benefits discussed, surgical consent, monitors and equipment checked, pre-op evaluation and timeout performed Spinal Block Patient position: sitting Prep: Betadine Patient monitoring: heart rate, continuous pulse ox, blood pressure and cardiac monitor Approach: midline Location: L3-4 Injection technique: single-shot Needle Needle type: Whitacre and Introducer  Needle gauge: 25 G Needle length: 9 cm Assessment Sensory level: T10 Additional Notes Negative paresthesia. Negative blood return. Positive free-flowing CSF. Expiration date of kit checked and confirmed. Patient tolerated procedure well, without complications.

## 2020-10-17 NOTE — ED Provider Notes (Signed)
[facility] Emergency Department Provider Note   ____________________________________________   Event Date/Time   First MD Initiated Contact with Patient 10/17/20 1007     (approximate)  I have reviewed the triage vital signs and the nursing notes.   HISTORY  Chief Complaint Fall    HPI Casey Rangel is a 70 y.o. male patient presents with left hip pain secondary to a trip and fall at [employer].  Patient denies LOC or head injury.  Patient state unable to bear weight on the left lower extremity.  Patient denies loss of sensation.  Rates pain as a 10/10.  No palliative measure prior to arrival.  Patient refused pain medication at this time.         History reviewed. No pertinent past medical history.  There are no problems to display for this patient.   History reviewed. No pertinent surgical history.  Prior to Admission medications   Not on File    Allergies Patient has no allergy information on record.  No family history on file.  Social History    Review of Systems Constitutional: No fever/chills Eyes: No visual changes. ENT: No sore throat. Cardiovascular: Denies chest pain. Respiratory: Denies shortness of breath. Gastrointestinal: No abdominal pain.  No nausea, no vomiting.  No diarrhea.  No constipation. Genitourinary: Negative for dysuria. Musculoskeletal: Left hip pain. Skin: Negative for rash. Neurological: Negative for headaches, focal weakness or numbness.  ____________________________________________   PHYSICAL EXAM:  VITAL SIGNS: ED Triage Vitals  Enc Vitals Group     BP 10/17/20 0957 (!) 146/100     Pulse Rate 10/17/20 0957 85     Resp 10/17/20 0957 18     Temp 10/17/20 0957 98 F (36.7 C)     Temp src --      SpO2 10/17/20 0957 100 %     Weight --      Height --      Head Circumference --      Peak Flow --      Pain Score 10/17/20 0955 10     Pain Loc --      Pain Edu? --      Excl. in GC? --      Constitutional: Alert and oriented. Well appearing and in no acute distress. Eyes: Conjunctivae are normal. PERRL. EOMI. Head: Atraumatic. Nose: No congestion/rhinnorhea. Mouth/Throat: Mucous membranes are moist.  Oropharynx non-erythematous. Neck: No stridor.  No cervical spine tenderness to palpation. Hematological/Lymphatic/Immunilogical: No cervical lymphadenopathy. Cardiovascular: Normal rate, regular rhythm. Grossly normal heart sounds.  Good peripheral circulation.  Abated blood pressure. Respiratory: Normal respiratory effort.  No retractions. Lungs CTAB. Gastrointestinal: Soft and nontender. No distention. No abdominal bruits. No CVA tenderness. Genitourinary: Deferred Musculoskeletal: No obvious deformity.  Moderate guarding palpation of the greater trochanter.  Range of motion deferred secondary to complaint of pain.  Patient is able to move his ankle foot and toes. Neurologic:  Normal speech and language. No gross focal neurologic deficits are appreciated. No gait instability. Skin:  Skin is warm, dry and intact. No rash noted.  No abrasion or ecchymosis. Psychiatric: Mood and affect are normal. Speech and behavior are normal.  ____________________________________________   LABS (all labs ordered are listed, but only abnormal results are displayed)  Labs Reviewed  COMPREHENSIVE METABOLIC PANEL - Abnormal; Notable for the following components:      Result Value   Glucose, Bld 147 (*)    Calcium 8.8 (*)    All other components  within normal limits  CBC WITH DIFFERENTIAL/PLATELET - Abnormal; Notable for the following components:   Hemoglobin 10.2 (*)    HCT 33.7 (*)    MCV 79.7 (*)    MCH 24.1 (*)    RDW 16.5 (*)    All other components within normal limits  TROPONIN I (HIGH SENSITIVITY) - Abnormal; Notable for the following components:   Troponin I (High Sensitivity) 32 (*)    All other components within normal limits  PROTIME-INR  URINALYSIS, COMPLETE (UACMP)  WITH MICROSCOPIC  POC SARS CORONAVIRUS 2 AG -  ED  SAMPLE TO BLOOD BANK   ____________________________________________  EKG  Read by heart station Dr. ____________________________________________  RADIOLOGY I, Joni Reining, personally viewed and evaluated these images (plain radiographs) as part of my medical decision making, as well as reviewing the written report by the radiologist.  ED MD interpretation: Left intertrochanter fracture with mild displacement.  Official radiology report(s): DG Chest 1 View  Result Date: 10/17/2020 CLINICAL DATA:  Fall.  Left hip pain EXAM: CHEST  1 VIEW COMPARISON:  None. FINDINGS: The heart size and mediastinal contours are within normal limits. Both lungs are clear. The visualized skeletal structures are unremarkable. IMPRESSION: No active disease. Electronically Signed   By: Marlan Palau M.D.   On: 10/17/2020 11:04   DG Hip Unilat With Pelvis 2-3 Views Left  Result Date: 10/17/2020 CLINICAL DATA:  Fall, left hip pain EXAM: DG HIP (WITH OR WITHOUT PELVIS) 2-3V LEFT COMPARISON:  None. FINDINGS: There is a left femoral intertrochanteric fracture with varus angulation. No subluxation or dislocation. Mild degenerative changes in the hip joints bilaterally. IMPRESSION: Mildly angulated left femoral intertrochanteric fracture. Electronically Signed   By: Charlett Nose M.D.   On: 10/17/2020 11:03    ____________________________________________   PROCEDURES  Procedure(s) performed (including Critical Care):  Procedures   ____________________________________________   INITIAL IMPRESSION / ASSESSMENT AND PLAN / ED COURSE  As part of my medical decision making, I reviewed the following data within the electronic MEDICAL RECORD NUMBER         Patient presents with left hip pain and nonweightbearing secondary to a trip and fall.  Discussed x-ray findings with patient showing at trochanter fracture of the left hip.  Discussed patient with on-call  orthopedic who will admit and assume care.   ____________________________________________   FINAL CLINICAL IMPRESSION(S) / ED DIAGNOSES  Final diagnoses:  Closed left hip fracture, initial encounter [facility])     ED Discharge Orders    None      *Please note:  Casey Mabe was evaluated in Emergency Department on 10/17/2020 for the symptoms described in the history of present illness. He was evaluated in the context of the global COVID-19 pandemic, which necessitated consideration that the patient might be at risk for infection with the SARS-CoV-2 virus that causes COVID-19. Institutional protocols and algorithms that pertain to the evaluation of patients at risk for COVID-19 are in a state of rapid change based on information released by regulatory bodies including the CDC and federal and state organizations. These policies and algorithms were followed during the patient's care in the ED.  Some ED evaluations and interventions may be delayed as a result of limited staffing during and the pandemic.*   Note:  This document was prepared using Dragon voice recognition software and may include unintentional dictation errors.    Joni Reining, PA-C 10/17/20 1140    Willy Eddy, MD 10/17/20 [phone]

## 2020-10-17 NOTE — H&P (Signed)
History and Physical    PLEASE NOTE THAT DRAGON DICTATION SOFTWARE WAS USED IN THE CONSTRUCTION OF THIS NOTE.   Casey Rangel ZOX:[id number] DOB: [birth year] DOA: 10/17/2020  PCP: Patient, No Pcp Per Patient coming from: home   I have personally briefly reviewed patient's old medical records in [facility]  Chief Complaint: left hip pain  HPI: Casey Rangel is a 70 y.o. male with medical history significant for GERD, hiatal hernia, who is admitted to [facility] on 10/17/2020 with acute left intertrochanteric hip fracture after presenting to [facility] ED via EMS complaining of left pain.   The following history is obtained via my discussions with the patient, my discussions with the ED physician, and via chart review.   The patient reports that he tripped as he attempted to step off the curb at the [employer] gas station early today, resulting in a fall of approximately 8 inches to the adjoining concrete surface, to which his left hip was the principal point of contact. He reports immediate onset of sharp left hip pain, with radiation into the left groin.  States that this pain has been constant since onset with exacerbation when attempting to move his left lower extremity.  As a consequence of the associated intensity of his discomfort, he reports that he is unable to bear weight on the LLE at this time.  This is relative to his baseline ambulatory status in which she reports is ability to independently ambulate without any need for support devices.  Otherwise, he denies any acute arthralgias or myalgias as a result of the above fall.  Denies any associated numbness or paresthesias in bilateral lower extremities. Patient confirms that left hip representations a native joint for him.  Did not hit head as a component of this fall, and denies any associated loss of consciousness.  Denies any preceding or associated chest pain, shortness of breath, diaphoresis, palpitations, nausea,  vomiting, dizziness, presyncope, or syncope.  Denies any subsequent headache, neck pain, blurry vision, or diplopia.  Not on any blood thinners as an outpatient, including no aspirin.  Denies any known history of coronary artery disease or CHF. He denies any recent orthopnea, PND, or peripheral edema.   The patient's fall was reportedly witnessed by a patron at the gas station, who reportedly contacted EMS, and the patient was subsequently brought to [facility] ED for further evaluation of acute left hip pain in the setting of the above fall.    Denies any recent subjective fever, chills, rigors, or generalized myalgias. Denies any recent neck stiffness, rhinitis, rhinorrhea, sore throat, sob, wheezing, cough, abdominal pain, diarrhea, or rash. No recent traveling or known COVID-19 exposures. Denies dysuria, gross hematuria, or change in urinary urgency/frequency.      ED Course:  Vital signs in the ED were notable for the following: Temperature max 98.0, heart rate 85-94; initial blood pressure to be 146/100, which decreased to 129/81 following administration single dose of IV Dilaudid, as further described below; respiratory rate 18, oxygen saturation 91 to 100% room air.  Labs were notable for the following: CMP was notable for the following: Sodium 139, bicarbonate 23, BUN 18, creatinine 1.02, glucose 147.  High-sensitivity troponin I x1 was found to be 32, with no prior troponin data point available for point comparison.  Repeat high-sensitivity troponin I has been ordered, with result currently pending.  Additionally, urinalysis was ordered, with result currently pending.  CBC notable for the following: White blood cell count 6600, hemoglobin 10.2, with  no prior hemoglobin to 8 point available for point comparison, and associated with MCV 79.2, normochromic finding, RDW 16.5, while platelet count was noted to be 378.  INR 1.1.  Screening COVID-19 antigen was performed in the ED today, and found to be  negative.  Plain films of the left hip and pelvis showed mildly angulated left femoral intertrochanteric fracture without evidence of subluxation or dislocation.  Chest x-ray showed no evidence of acute cardiopulmonary process.  EKG showed sinus rhythm with heart rate 95, QTc 490 ms, nonspecific T wave inversion in leads I and aVL, with no prior EKG available for point comparison, and no evidence of ST changes, including no evidence of ST elevation.  The patient's case was discussed with the on-call orthopedic surgeon, Dr. Rosita Kea, who will consult regarding the patient's presenting acute left intertrochanteric hip fracture, with plan for definitive surgical intervention in the form of ORIF to occur either later today or tomorrow (10/18/20). He requested that patient be kept n.p.o. for now until more specific timeframe for gross left hip surgery is determined.  While in the ED, the following were administered: Dilaudid 0.5 mg IV x1, Zofran 4 mg IV x1.     Review of Systems: As per HPI otherwise 10 point review of systems negative.   Past Medical History:  Diagnosis Date  . GERD (gastroesophageal reflux disease)   . Hiatal hernia     Past Surgical History:  Procedure Laterality Date  . T9-T11 fusion in 1990      Social History:  reports that he quit smoking about 21 years ago. His smoking use included cigarettes. He has a 45.00 pack-year smoking history. He has never used smokeless tobacco. He reports previous alcohol use. He reports that he does not use drugs.   History reviewed. No pertinent family history.    Prior to Admission medications   Medication Sig Start Date End Date Taking? Authorizing Provider  omeprazole (PRILOSEC OTC) 20 MG tablet Take 20 mg by mouth daily.   Yes [provider]     Objective    Physical Exam: Vitals:   10/17/20 0957 10/17/20 1104 10/17/20 1119  BP: (!) 146/100 129/81   Pulse: 85 94 89  Resp: 18    Temp: 98 F (36.7 C)    SpO2:  100% (!) 85% 91%    General: appears to be stated age; alert, oriented Skin: warm, dry; small abrasion over anterior surface of left knee without evidence of active bleeding Head:  AT/[place] Mouth:  Oral mucosa membranes appear moist, normal dentition Neck: supple; trachea midline Heart:  RRR; did not appreciate any M/R/G Lungs: CTAB, did not appreciate any wheezes, rales, or rhonchi Abdomen: + BS; soft, ND, NT Vascular: 2+ pedal pulses b/l; 2+ radial pulses b/l Extremities: no peripheral edema, no muscle wasting; more abrasion over anterior surface of left knee, as above; LLE appears externally rotated and slightly shorter than RLE.  Neuro: sensation intact in upper and lower extremities b/l; strength intact in RLE; unable to fully assess current strength associated with left lower extremity in context of current degree of pain control.    Labs on Admission: I have personally reviewed following labs and imaging studies  CBC: Recent Labs  Lab 10/17/20 1049  WBC 6.6  NEUTROABS 4.8  HGB 10.2*  HCT 33.7*  MCV 79.7*  PLT 378   Basic Metabolic Panel: Recent Labs  Lab 10/17/20 1049  NA 139  K 3.6  CL 109  CO2 23  GLUCOSE 147*  BUN 18  CREATININE 1.02  CALCIUM 8.8*   GFR: CrCl cannot be calculated (Unknown ideal weight.). Liver Function Tests: Recent Labs  Lab 10/17/20 1049  AST 19  ALT 13  ALKPHOS 54  BILITOT 0.9  PROT 7.1  ALBUMIN 3.8   No results for input(s): LIPASE, AMYLASE in the last 168 hours. No results for input(s): AMMONIA in the last 168 hours. Coagulation Profile: Recent Labs  Lab 10/17/20 1049  INR 1.1   Cardiac Enzymes: No results for input(s): CKTOTAL, CKMB, CKMBINDEX, TROPONINI in the last 168 hours. BNP (last 3 results) No results for input(s): PROBNP in the last 8760 hours. HbA1C: No results for input(s): HGBA1C in the last 72 hours. CBG: No results for input(s): GLUCAP in the last 168 hours. Lipid Profile: No results for input(s):  CHOL, HDL, LDLCALC, TRIG, CHOLHDL, LDLDIRECT in the last 72 hours. Thyroid Function Tests: No results for input(s): TSH, T4TOTAL, FREET4, T3FREE, THYROIDAB in the last 72 hours. Anemia Panel: No results for input(s): VITAMINB12, FOLATE, FERRITIN, TIBC, IRON, RETICCTPCT in the last 72 hours. Urine analysis: No results found for: COLORURINE, APPEARANCEUR, LABSPEC, PHURINE, GLUCOSEU, HGBUR, BILIRUBINUR, KETONESUR, PROTEINUR, UROBILINOGEN, NITRITE, LEUKOCYTESUR  Radiological Exams on Admission: DG Chest 1 View  Result Date: 10/17/2020 CLINICAL DATA:  Fall.  Left hip pain EXAM: CHEST  1 VIEW COMPARISON:  None. FINDINGS: The heart size and mediastinal contours are within normal limits. Both lungs are clear. The visualized skeletal structures are unremarkable. IMPRESSION: No active disease. Electronically Signed   By: Marlan Palau M.D.   On: 10/17/2020 11:04   DG Hip Unilat With Pelvis 2-3 Views Left  Result Date: 10/17/2020 CLINICAL DATA:  Fall, left hip pain EXAM: DG HIP (WITH OR WITHOUT PELVIS) 2-3V LEFT COMPARISON:  None. FINDINGS: There is a left femoral intertrochanteric fracture with varus angulation. No subluxation or dislocation. Mild degenerative changes in the hip joints bilaterally. IMPRESSION: Mildly angulated left femoral intertrochanteric fracture. Electronically Signed   By: Charlett Nose M.D.   On: 10/17/2020 11:03     EKG: Independently reviewed, with result as described above.    Assessment/Plan   Casey Rangel is a 70 y.o. male with medical history significant for GERD, hiatal hernia, who is admitted to [facility] on 10/17/2020 with acute left intertrochanteric hip fracture after presenting to [facility] ED via EMS complaining of left pain.    Principal Problem:   Closed left hip fracture (HCC) Active Problems:   Left hip pain   GERD (gastroesophageal reflux disease)   Prolonged QT interval   Elevated troponin   Anemia   Fall     #) Acute left  intertrochanteric hip fracture: confirmed via presenting plain films of the left hip and stemming from a ground level mechanical fall without associated loss of consciousness that occurred earlier on the day of admission, as further described above, resulting in immediate development of acute left hip pain.  The patient's case/imaging were discussed with the on-call orthopedic surgeon, Dr. Rosita Kea,  who recommended admission to the hospitalist service for further evaluation and management of acute left hip fracture, including preoperative medical optimization, and plan to take pt to the OR for efinitive surgical management either later today or tomorrow (10/18/20). Until further clarification regarding timing of surgical intervention to occur later today vs tomorrow, orthopedic surgery requests that patient be kept NPO. At this time, the left lower extremity is neurovascularly intact, and the patient reports adequate pain control. Not on any blood thinners at home,  including no aspirin.   Gupta Score for this patient in the context of anticipated aforementioned orthopedic surgery conveys a 0.17% perioperative risk for significant cardiac event. No evidence to suggest acutely decompensated heart failure or acute MI. Consequently, no absolute contraindications to proceeding with proposed orthopedic surgery at this time. INR non-elevated at 1.1.    Plan: Formal orthopedic surgery consult for definitive surgical management, with surgery to occur later today or tomorrow, as above. NPO. No pharmacologic anticoagulation leading up to this anticipated surgery. SCD's. Prn IV fentanyl. Prn Ativan for nausea, chosen as a result of mildly prolonged QTc at presentation, as further noted below. Anticipate postoperative PT consult. Check 25-hydroxy vit D level.       #) Ground level mechanical fall: The patient reports a ground level mechanical fall earlier today in which he tripped while stepping off curb at gas station  without any associated loss of consciousness.  Does not appear to have hit head as component of this fall. presentation does not appear to be associated any acute neurologic deficits, although evaluation strength of the left lower extremity is currently limited by the patient current degree of pain control. While this fall appears to be purely mechanical in nature, will also check urinalysis to rule out any underlying infectious contribution.   Plan: Check urinalysis, as above.  Repeat BMP and CBC with differential in the morning.       #) Microcytic Anemia: Labs performed in the ED today reflected hemoglobin of 10.2 associated with mildly microcytic, normochromic findings as well as a mildly elevated RDW, all the context no prior available hemoglobin data point for point of comparison to establish degree of chronicity.  No obvious associated evidence of acute bleed, although the patient certainly at risk for development of such, including at the site of the presenting acute left hip fracture, although no clinical evidence to suggest hematoma at this time.  No clinical evidence to suggest acute gastrointestinal bleeding, the patient denies any recent melena or hematochezia.  Not on any blood thinners as an outpatient, including no aspirin.  He appears asymptomatic as relates to this finding of anemia, including no recent shortness of breath, chest pain, dizziness, presyncope, or syncope.  Additionally, the patient appears hemodynamically stable. Will further evaluate with additional laboratory studies, including iron studies, with the caveat that ferritin is an acute phase reactant, may be artificially elevated in the setting of presenting, mechanical fall with associated acute left hip fracture.  Presenting INR nonelevated. No indication for pre-op transfusion at this time.   Plan: will add on the following labs: Total iron, TIBC, ferritin, MMA, folic acid, and reticulocyte count.  SCDs.  Repeat CBC in  the morning. Type and screen. Will further evaluate for any prior screening colonoscopy results. Monitor on tele. Follow-up on UA.      #) prolonged QTc: Presenting QT mildly prolonged QTC of 490 ms, with no prior EKG available for body comparison, also showing no evidence of over acute ischemic change, including no evidence of ST elevation.  Does not appear to be on any medications associated with QTC prolongation.  In context of borderline QTC prolongation, will select Ativan as prn anti-emetic.   Plan: Add on a serum magnesium level.  Monitor on telemetry.  Ordered a EKG for tomorrow morning to trend interval degree of QTC prolongation.  Attempt avoid agents that are associated with QTC prolongation.  Correspondingly, have elected to proceed with Ativan as prn anti-emetic.  Evaluation and management of presenting  mildly elevated troponin, as further described below.         #) Elevated troponin: mildly elevated initial troponin of 32, with no prior high sensitivity troponin I value available for point of comparison.  Suspect that this mildly elevated troponin is on the basis of supply demand mismatch in the setting of presenting microcytic anemia as a consequence of associated diminished oxygen carrying capacity and correspondingly diminished oxygen delivery capacity as opposed to representing a type I process due to acute plaque rupture.  EKG without evidence of acute ischemic changes, including no evidence of STEMI, chest x-ray shows no evidence of acute cardiopulmonary process, including no evidence of pneumothorax.  Additionally, presentation is not associated with any CP.  Overall, ACS is felt to be less likely relative to type 2 supply demand mismatch, as above, but will closely monitor on telemetry overnight while further evaluating suspected contributory presenting anemia, as further described above. Presentation is clinically less suggestive of acute PE at this time. No known h/o CAD.  Will refrain from aspirin at this time given plan for definitive surgical intention from an orthopedic standpoint in the setting of presenting acute left hip fracture, as above.  Repeat high-sensitivity troponin I has been ordered, with result currently pending.   Plan: will follow for result of repeat troponin, as above. Monitor on telemetry.  Add on serum magnesium level.  Further evaluation and management of anemia noted at presentation, including repeat CBC in the morning, as further described above.      #) GERD: On daily omeprazole as an outpatient.  In the setting of current n.p.o. status in the context of anticipated orthopedic surgery over the next day, will hold home omeprazole for now and proceed with daily IV Protonix.   Plan: Hold home oral PPI for now.  Protonix 40 mg IV daily while n.p.o., as above.     DVT prophylaxis: scd's   Code Status: Full code Family Communication: none Disposition Plan: Per Rounding Team Consults called: Dr. Rosita Kea of orthopedic surgery formally consulted with plan for definitive surgical intervention regarding presentation acute left hip fx, as further described above.   Admission status: inpatient    Of note, this patient was added by me to the following Admit List/Treatment Team: armcadmits.      PLEASE NOTE THAT DRAGON DICTATION SOFTWARE WAS USED IN THE CONSTRUCTION OF THIS NOTE.   Angie Fava DO [facility] Pager [phone] From 12PM - 12AM  Otherwise, please contact night-coverage  [email or web address] Password [email or web address]   10/17/2020, 1:30 PM

## 2020-10-17 NOTE — Anesthesia Preprocedure Evaluation (Signed)
Anesthesia Evaluation  Patient identified by MRN, date of birth, ID band Patient awake    Reviewed: Allergy & Precautions, H&P , NPO status , Patient's Chart, lab work & pertinent test results, reviewed documented beta blocker date and time   Airway Mallampati: II   Neck ROM: full    Dental  (+) Teeth Intact   Pulmonary neg pulmonary ROS, former smoker,    Pulmonary exam normal        Cardiovascular Exercise Tolerance: Poor (-) anginanegative cardio ROS Normal cardiovascular exam Rhythm:regular Rate:Normal     Neuro/Psych negative neurological ROS  negative psych ROS   GI/Hepatic Neg liver ROS, hiatal hernia, GERD  Medicated,  Endo/Other  negative endocrine ROS  Renal/GU negative Renal ROS  negative genitourinary   Musculoskeletal   Abdominal   Peds  Hematology  (+) Blood dyscrasia, anemia ,   Anesthesia Other Findings Past Medical History: No date: GERD (gastroesophageal reflux disease) No date: Hiatal hernia Past Surgical History: No date: T9-T11 fusion in 1990   Reproductive/Obstetrics negative OB ROS                            Anesthesia Physical Anesthesia Plan  ASA: III and emergent  Anesthesia Plan: Spinal   Post-op Pain Management:    Induction:   PONV Risk Score and Plan: 2  Airway Management Planned:   Additional Equipment:   Intra-op Plan:   Post-operative Plan:   Informed Consent: I have reviewed the patients History and Physical, chart, labs and discussed the procedure including the risks, benefits and alternatives for the proposed anesthesia with the patient or authorized representative who has indicated his/her understanding and acceptance.     Dental Advisory Given  Plan Discussed with: CRNA  Anesthesia Plan Comments:        Anesthesia Quick Evaluation

## 2020-10-17 NOTE — Transfer of Care (Signed)
Immediate Anesthesia Transfer of Care Note  Patient: Casey Rangel  Procedure(s) Performed: INTRAMEDULLARY (IM) NAIL INTERTROCHANTRIC (Left Hip)  Patient Location: PACU  Anesthesia Type:Spinal  Level of Consciousness: sedated  Airway & Oxygen Therapy: Patient Spontanous Breathing and Patient connected to face mask oxygen  Post-op Assessment: Report given to RN and Post -op Vital signs reviewed and stable  Post vital signs: Reviewed and stable  Last Vitals:  Vitals Value Taken Time  BP 108/80 10/17/20 1815  Temp 36.7 C 10/17/20 1812  Pulse 92 10/17/20 1818  Resp 20 10/17/20 1818  SpO2 99 % 10/17/20 1818  Vitals shown include unvalidated device data.  Last Pain:  Vitals:   10/17/20 1650  TempSrc: Tympanic  PainSc:          Complications: No complications documented.

## 2020-10-17 NOTE — Anesthesia Procedure Notes (Signed)
Date/Time: 10/17/2020 5:40 PM Performed by: Junious Silk, CRNA Pre-anesthesia Checklist: Patient identified, Emergency Drugs available, Suction available, Patient being monitored and Timeout performed Oxygen Delivery Method: Simple face mask

## 2020-10-17 NOTE — Op Note (Signed)
10/17/2020  6:14 PM  PATIENT:  Casey Rangel  70 y.o. male  PRE-OPERATIVE DIAGNOSIS:  Left Hip Fracture intertrochanteric  POST-OPERATIVE DIAGNOSIS:  Left Hip Fracture intertrochanteric  PROCEDURE:  Procedure(s): INTRAMEDULLARY (IM) NAIL INTERTROCHANTRIC (Left)  SURGEON: Leitha Schuller, MD  ASSISTANTS: None  ANESTHESIA:   spinal  EBL:  Total I/O In: 800 [I.V.:700; IV Piggyback:100] Out: 300 [Urine:300]  BLOOD ADMINISTERED:none  DRAINS: none   LOCAL MEDICATIONS USED:  NONE  SPECIMEN:  No Specimen  DISPOSITION OF SPECIMEN:  N/A  COUNTS:  YES  TOURNIQUET:  * No tourniquets in log *  IMPLANTS: Biomet affixes left 130 degrees 180 x 11 mm with a 115 mm leg screw 48 mm distal interlocking screw  DICTATION: .Dragon Dictation patient was brought to the operating room and with adequate anesthesia obtained the patient was placed on the fracture table with the right leg in the well-leg holder left foot in the traction boot.  C arm was brought in and good visualization of the reduced fracture was obtained with traction having been applied.  After prepping and draping using the barrier drape method appropriate patient identification and timeout procedures were completed.  A small proximal incision was made and the trochanter was identified and a guidewire inserted into the tip followed by proximal reaming and placement of the long guidewire.  Measurements were made off of this based on x-ray of the knee rod length determined.  Reaming was carried out to 13 mm and 11 x 180 rod was inserted down the canal to the appropriate depth.  A lateral incision made in the IT band and quad split to allow for passage of a guidewire up into the center of the femoral head and AP and lateral projections.  Measurements made off of this and drilling carried out followed by placement of the lag screw.  Traction was released at this point and the compression device applied to get good compression at the fracture  site.  Next the setscrew was placed proximally tightening and a quarter turn loosening to allow for any further complete impaction.  Next a perfect circle technique was used distally through the oblique screw hole with a single screw drilling measuring and placing the 5.0 screw with permanent C arm view obtained.  Following this the wounds were irrigated and closed with #1 Vicryl for the deep fascia and the proximal incision 2-0 Vicryl subcutaneously and skin staples followed by Xeroform and honeycomb dressings.  PLAN OF CARE: Admit to inpatient   PATIENT DISPOSITION:  PACU - hemodynamically stable.

## 2020-10-17 NOTE — Consult Note (Signed)
Reason for Consult: Left intertrochanteric hip fracture Referring Physician: Emergency room  Casey Rangel is an 70 y.o. male.  HPI: Patient is a 70 year old who was pumping gas and stepped off the curb and fell onto his left side and had immediate pain to the left side and was unable to bear weight.  Is brought to the emergency room was found to have a hip fracture.  He denies prodromal symptoms loss of consciousness.  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  He lives with his sister.  History reviewed. No pertinent past medical history.  History reviewed. No pertinent surgical history.  No family history on file.  Social History:  has no history on file for tobacco use, alcohol use, and drug use.  Allergies: Not on File  Medications: I have reviewed the patient's current medications.  Results for orders placed or performed during the hospital encounter of 10/17/20 (from the past 48 hour(s))  Comprehensive metabolic panel     Status: Abnormal   Collection Time: 10/17/20 10:49 AM  Result Value Ref Range   Sodium 139 135 - 145 mmol/L   Potassium 3.6 3.5 - 5.1 mmol/L   Chloride 109 98 - 111 mmol/L   CO2 23 22 - 32 mmol/L   Glucose, Bld 147 (H) 70 - 99 mg/dL    Comment: Glucose reference range applies only to samples taken after fasting for at least 8 hours.   BUN 18 8 - 23 mg/dL   Creatinine, Ser 7.84 0.61 - 1.24 mg/dL   Calcium 8.8 (L) 8.9 - 10.3 mg/dL   Total Protein 7.1 6.5 - 8.1 g/dL   Albumin 3.8 3.5 - 5.0 g/dL   AST 19 15 - 41 U/L   ALT 13 0 - 44 U/L   Alkaline Phosphatase 54 38 - 126 U/L   Total Bilirubin 0.9 0.3 - 1.2 mg/dL   GFR, Estimated >69 >62 mL/min    Comment: (NOTE) Calculated using the CKD-EPI Creatinine Equation (2021)    Anion gap 7 5 - 15    Comment: Performed at [facility], [address] [place], [place] [address]  CBC with Differential     Status: Abnormal   Collection Time: 10/17/20 10:49 AM  Result Value Ref Range    WBC 6.6 4.0 - 10.5 K/uL   RBC 4.23 4.22 - 5.81 MIL/uL   Hemoglobin 10.2 (L) 13.0 - 17.0 g/dL   HCT 13.2 (L) 44.0 - 10.2 %   MCV 79.7 (L) 80.0 - 100.0 fL   MCH 24.1 (L) 26.0 - 34.0 pg   MCHC 30.3 30.0 - 36.0 g/dL   RDW 72.5 (H) 36.6 - 44.0 %   Platelets 378 150 - 400 K/uL   nRBC 0.0 0.0 - 0.2 %   Neutrophils Relative % 72 %   Neutro Abs 4.8 1.7 - 7.7 K/uL   Lymphocytes Relative 22 %   Lymphs Abs 1.4 0.7 - 4.0 K/uL   Monocytes Relative 5 %   Monocytes Absolute 0.4 0.1 - 1.0 K/uL   Eosinophils Relative 1 %   Eosinophils Absolute 0.0 0.0 - 0.5 K/uL   Basophils Relative 0 %   Basophils Absolute 0.0 0.0 - 0.1 K/uL   Immature Granulocytes 0 %   Abs Immature Granulocytes 0.02 0.00 - 0.07 K/uL    Comment: Performed at [facility], [address] [place], [place] [address]  Protime-INR     Status: None   Collection Time: 10/17/20 10:49 AM  Result Value Ref  Range   Prothrombin Time 13.3 11.4 - 15.2 seconds   INR 1.1 0.8 - 1.2    Comment: (NOTE) INR goal varies based on device and disease states. Performed at [facility], [address] [place], [place] [address]   Troponin I (High Sensitivity)     Status: Abnormal   Collection Time: 10/17/20 10:49 AM  Result Value Ref Range   Troponin I (High Sensitivity) 32 (H) <18 ng/L    Comment: (NOTE) Elevated high sensitivity troponin I (hsTnI) values and significant  changes across serial measurements may suggest ACS but many other  chronic and acute conditions are known to elevate hsTnI results.  Refer to the "Links" section for chest pain algorithms and additional  guidance. Performed at [facility], [address], [place], [place] [address]   Sample to Blood Bank     Status: None   Collection Time: 10/17/20 10:49 AM  Result Value Ref Range   Blood Bank Specimen SAMPLE HEMOLYZED    Sample Expiration      10/20/2020,2359 Performed at [facility], [address] [place] [place], [place]  [address]     DG Chest 1 View  Result Date: 10/17/2020 CLINICAL DATA:  Fall.  Left hip pain EXAM: CHEST  1 VIEW COMPARISON:  None. FINDINGS: The heart size and mediastinal contours are within normal limits. Both lungs are clear. The visualized skeletal structures are unremarkable. IMPRESSION: No active disease. Electronically Signed   By: Marlan Palau M.D.   On: 10/17/2020 11:04   DG Hip Unilat With Pelvis 2-3 Views Left  Result Date: 10/17/2020 CLINICAL DATA:  Fall, left hip pain EXAM: DG HIP (WITH OR WITHOUT PELVIS) 2-3V LEFT COMPARISON:  None. FINDINGS: There is a left femoral intertrochanteric fracture with varus angulation. No subluxation or dislocation. Mild degenerative changes in the hip joints bilaterally. IMPRESSION: Mildly angulated left femoral intertrochanteric fracture. Electronically Signed   By: Charlett Nose M.D.   On: 10/17/2020 11:03    Review of Systems Blood pressure 129/81, pulse 89, temperature 98 F (36.7 C), resp. rate 18, SpO2 91 %. Physical Exam The left leg is flexed externally rotated and shortened.  There is trace dorsalis pedis posterior tib pulses stable flex extend the toes.  He has mild swelling to the proximal thigh with skin intact. Assessment/Plan: Left intertrochanteric hip fracture Plan is for open reduction internal fixation, possibly later today  Kennedy Bucker 10/17/2020, 12:51 PM

## 2020-10-17 NOTE — Progress Notes (Signed)
Moderate amount of bleeding on l hip dressing. Dr Rosita Kea notified and orders for ABD pads and tape for reinforcement.

## 2020-10-17 NOTE — ED Triage Notes (Signed)
Pt comes via [facility] from [employer] store getting gas. Pt states he tripped over the curb and fell. EMS reports no LOC or blood thinners. VSS  Pt states pain to left hip. Pt unable to bear weight.

## 2020-10-17 NOTE — ED Notes (Signed)
Patients pulse ox was 88-90 after dilaudid, so I put him on 1 liter which has kept it above 90%.  He is alert and says pain is better except when he moves at all.

## 2020-10-18 ENCOUNTER — Encounter: Payer: Self-pay | Admitting: Orthopedic Surgery

## 2020-10-18 DIAGNOSIS — W19XXXA Unspecified fall, initial encounter: Secondary | ICD-10-CM

## 2020-10-18 DIAGNOSIS — R030 Elevated blood-pressure reading, without diagnosis of hypertension: Secondary | ICD-10-CM

## 2020-10-18 DIAGNOSIS — R778 Other specified abnormalities of plasma proteins: Secondary | ICD-10-CM

## 2020-10-18 DIAGNOSIS — S72002A Fracture of unspecified part of neck of left femur, initial encounter for closed fracture: Secondary | ICD-10-CM

## 2020-10-18 DIAGNOSIS — K219 Gastro-esophageal reflux disease without esophagitis: Secondary | ICD-10-CM

## 2020-10-18 LAB — BASIC METABOLIC PANEL
Anion gap: 7 (ref 5–15)
BUN: 14 mg/dL (ref 8–23)
CO2: 24 mmol/L (ref 22–32)
Calcium: 8 mg/dL — ABNORMAL LOW (ref 8.9–10.3)
Chloride: 105 mmol/L (ref 98–111)
Creatinine, Ser: 0.94 mg/dL (ref 0.61–1.24)
GFR, Estimated: >60 mL/min (ref >60)
Glucose, Bld: 121 mg/dL — ABNORMAL HIGH (ref 70–99)
Potassium: 3.7 mmol/L (ref 3.5–5.1)
Sodium: 136 mmol/L (ref 135–145)

## 2020-10-18 LAB — MAGNESIUM: Magnesium: 1.9 mg/dL (ref 1.7–2.4)

## 2020-10-18 LAB — CBC
HCT: 43.1 % (ref 39.0–52.0)
HCT: 40.1 % (ref 39.0–52.0)
Hemoglobin: 14.9 g/dL (ref 13.0–17.0)
Hemoglobin: 13.7 g/dL (ref 13.0–17.0)
MCH: 31.2 pg (ref 26.0–34.0)
MCH: 30.7 pg (ref 26.0–34.0)
MCHC: 34.6 g/dL (ref 30.0–36.0)
MCHC: 34.2 g/dL (ref 30.0–36.0)
MCV: 90.2 fL (ref 80.0–100.0)
MCV: 89.9 fL (ref 80.0–100.0)
Platelets: 315 10*3/uL (ref 150–400)
Platelets: 287 10*3/uL (ref 150–400)
RBC: 4.78 MIL/uL (ref 4.22–5.81)
RBC: 4.46 MIL/uL (ref 4.22–5.81)
RDW: 12.7 % (ref 11.5–15.5)
RDW: 12.7 % (ref 11.5–15.5)
WBC: 13.1 10*3/uL — ABNORMAL HIGH (ref 4.0–10.5)
WBC: 10.6 10*3/uL — ABNORMAL HIGH (ref 4.0–10.5)
nRBC: 0 % (ref 0.0–0.2)
nRBC: 0 % (ref 0.0–0.2)

## 2020-10-18 LAB — TROPONIN I (HIGH SENSITIVITY): Troponin I (High Sensitivity): 47 ng/L — ABNORMAL HIGH (ref <18)

## 2020-10-18 LAB — HIV ANTIBODY (ROUTINE TESTING W REFLEX): HIV Screen 4th Generation wRfx: NONREACTIVE

## 2020-10-18 LAB — VITAMIN D 25 HYDROXY (VIT D DEFICIENCY, FRACTURES): Vit D, 25-Hydroxy: 15.6 ng/mL — ABNORMAL LOW (ref 30–100)

## 2020-10-18 MED ORDER — PANTOPRAZOLE SODIUM 40 MG PO TBEC
40.0000 mg | DELAYED_RELEASE_TABLET | Freq: Every day | ORAL | Status: DC
  Administered 2020-10-19 – 2020-10-22 (×4): 40 mg via ORAL
  Filled 2020-10-18 (×4): qty 1

## 2020-10-18 MED ORDER — HYDRALAZINE HCL 25 MG PO TABS: 25.0000 mg | ORAL_TABLET | Freq: Four times a day (QID) | ORAL | Status: DC | PRN

## 2020-10-18 NOTE — Progress Notes (Signed)
PROGRESS NOTE  Casey Rangel XLK:[id number] DOB: 1951/08/01   PCP: Patient, No Pcp Per  Patient is from: Home.  Independently ambulates at baseline.  DOA: 10/17/2020 LOS: 1  Chief complaints: Left hip pain  Brief Narrative / Interim history: 70 year old M with no significant PMH other than GERD and hiatal hernia brought to ED by EMS after he had accidental fall and left hip fracture.  He underwent intramedullary nailing by Dr. Rosita Kea on 10/18/2020.   Subjective: Seen and examined earlier this morning.  No major events overnight or this morning.  No complaints.  Pain fairly controlled.  He rates his pain as "a little sore".  Denies chest pain, dyspnea, GI or UTI symptoms. Objective: Vitals:   10/18/20 0324 10/18/20 0443 10/18/20 0811 10/18/20 1106  BP: (!) 147/91 (!) 146/86 (!) 165/108 (!) 122/94  Pulse: 93 93 90 97  Resp: 16 20 20 20   Temp: 98 F (36.7 C) 98.7 F (37.1 C) (!) 97.3 F (36.3 C) 97.6 F (36.4 C)  TempSrc: Oral Oral Oral Oral  SpO2: 96% 95% 96% 94%  Weight: 74.8 kg     Height:        Intake/Output Summary (Last 24 hours) at 10/18/2020 1112 Last data filed at 10/18/2020 1013 Gross per 24 hour  Intake 2284.12 ml  Output 725 ml  Net 1559.12 ml   Filed Weights   10/17/20 1700 10/18/20 0324  Weight: 74.8 kg 74.8 kg    Examination:  GENERAL: No apparent distress.  Nontoxic. HEENT: MMM.  Vision and hearing grossly intact.  NECK: Supple.  No apparent JVD.  RESP:  No IWOB.  Fair aeration bilaterally. CVS:  RRR. Heart sounds normal.  ABD/GI/GU: BS+. Abd soft, NTND.  MSK/EXT:  Moves extremities. No apparent deformity. No edema.  SKIN: Dressing over left hip and DCI. NEURO: Awake, alert and oriented appropriately.  No apparent focal neuro deficit. PSYCH: Calm. Normal affect.   Procedures:  10/18/2020-intramedullary nailing of left intertrochanteric fracture  Microbiology summarized: COVID-19 and influenza PCR nonreactive.  Assessment & Plan: Accidental fall at  home-stepped off the curb and fell on concrete surface Acute left intertrochanteric hip fracture-s/p intramedullary nailing by Dr. Rosita Kea on 10/18/2020 Vitamin D insufficiency -Pain control and VTE prophylaxis per surgery -Vitamin D supplementation -PT/OT  Prolonged QT-490 on initial EKG and improved to 469. -Avoid QT prolonging drugs  Elevated blood pressure: No history of HTN. -Discontinue IV fluids -As needed hydralazine with parameters  Elevated troponin-likely demand ischemia.  Noted some T wave flattening on his EKG but patient without chest pain or cardiopulmonary symptoms.  No risk factors other than his age.  GERD/hiatal hernia -PPI     Body mass index is 23.67 kg/m.         DVT prophylaxis:  enoxaparin (LOVENOX) injection 40 mg Start: 10/18/20 0800 SCDs Start: 10/17/20 1836 Place TED hose Start: 10/17/20 1836 SCDs Start: 10/17/20 1341  Code Status: Full code Family Communication: Patient and/or RN. Available if any question.  Level of care: Med-Surg Status is: Inpatient  Remains inpatient appropriate because:Unsafe d/c plan and Inpatient level of care appropriate due to severity of illness   Dispo: The patient is from: Home              Anticipated d/c is to: Home              Patient currently is not medically stable to d/c.   Difficult to place patient No       Consultants:  Orthopedic surgery  Sch Meds:  Scheduled Meds:  docusate sodium  100 mg Oral BID   enoxaparin (LOVENOX) injection  40 mg Subcutaneous Q24H   pantoprazole (PROTONIX) IV  40 mg Intravenous Q24H   Continuous Infusions:  sodium chloride Stopped (10/18/20 0620)    ceFAZolin (ANCEF) IV 2 g (10/18/20 1048)   methocarbamol (ROBAXIN) IV     PRN Meds:.acetaminophen, alum & mag hydroxide-simeth, bisacodyl, fentaNYL (SUBLIMAZE) injection, HYDROcodone-acetaminophen, HYDROcodone-acetaminophen, LORazepam, magnesium citrate, magnesium hydroxide, menthol-cetylpyridinium **OR**  phenol, methocarbamol **OR** methocarbamol (ROBAXIN) IV, morphine injection, zolpidem  Antimicrobials: Anti-infectives (From admission, onward)   Start     Dose/Rate Route Frequency Ordered Stop   10/17/20 2300  ceFAZolin (ANCEF) IVPB 2g/100 mL premix        2 g 200 mL/hr over 30 Minutes Intravenous Every 6 hours 10/17/20 2104 10/18/20 1659   10/17/20 1800  ceFAZolin (ANCEF) IVPB 2g/100 mL premix        2 g 200 mL/hr over 30 Minutes Intravenous  Once 10/17/20 1250 10/17/20 1722   10/17/20 1654  ceFAZolin (ANCEF) 2-4 GM/100ML-% IVPB       Note to Pharmacy: Register, Karen   : cabinet override      10/17/20 1654 10/17/20 1742       I have personally reviewed the following labs and images: CBC: Recent Labs  Lab 10/17/20 1049 10/17/20 2114 10/18/20 0609  WBC 6.6 13.1* 10.6*  NEUTROABS 4.8  --   --   HGB 10.2* 14.9 13.7  HCT 33.7* 43.1 40.1  MCV 79.7* 90.2 89.9  PLT 378 315 287   BMP &GFR Recent Labs  Lab 10/17/20 1049 10/17/20 1333 10/17/20 2114 10/18/20 0609  NA 139  --   --  136  K 3.6  --   --  3.7  CL 109  --   --  105  CO2 23  --   --  24  GLUCOSE 147*  --   --  121*  BUN 18  --   --  14  CREATININE 1.02  --  1.00 0.94  CALCIUM 8.8*  --   --  8.0*  MG  --  2.3  --  1.9   Estimated Creatinine Clearance: 76.6 mL/min (by C-G formula based on SCr of 0.94 mg/dL). Liver & Pancreas: Recent Labs  Lab 10/17/20 1049  AST 19  ALT 13  ALKPHOS 54  BILITOT 0.9  PROT 7.1  ALBUMIN 3.8   No results for input(s): LIPASE, AMYLASE in the last 168 hours. No results for input(s): AMMONIA in the last 168 hours. Diabetic: No results for input(s): HGBA1C in the last 72 hours. No results for input(s): GLUCAP in the last 168 hours. Cardiac Enzymes: No results for input(s): CKTOTAL, CKMB, CKMBINDEX, TROPONINI in the last 168 hours. No results for input(s): PROBNP in the last 8760 hours. Coagulation Profile: Recent Labs  Lab 10/17/20 1049  INR 1.1   Thyroid Function  Tests: No results for input(s): TSH, T4TOTAL, FREET4, T3FREE, THYROIDAB in the last 72 hours. Lipid Profile: No results for input(s): CHOL, HDL, LDLCALC, TRIG, CHOLHDL, LDLDIRECT in the last 72 hours. Anemia Panel: Recent Labs    10/17/20 1049  FOLATE 10.4  FERRITIN 168  TIBC 281  IRON 45  RETICCTPCT 1.5   Urine analysis:    Component Value Date/Time   COLORURINE YELLOW (A) 10/17/2020 1134   APPEARANCEUR HAZY (A) 10/17/2020 1134   LABSPEC 1.023 10/17/2020 1134   PHURINE 5.0 10/17/2020 1134   GLUCOSEU NEGATIVE 10/17/2020 1134  HGBUR NEGATIVE 10/17/2020 1134   BILIRUBINUR NEGATIVE 10/17/2020 1134   KETONESUR NEGATIVE 10/17/2020 1134   PROTEINUR NEGATIVE 10/17/2020 1134   NITRITE NEGATIVE 10/17/2020 1134   LEUKOCYTESUR NEGATIVE 10/17/2020 1134   Sepsis Labs: Invalid input(s): PROCALCITONIN, LACTICIDVEN  Microbiology: Recent Results (from the past 240 hour(s))  Resp Panel by RT-PCR (Flu A&B, Covid) Nasopharyngeal Swab     Status: None   Collection Time: 10/17/20  3:39 PM   Specimen: Nasopharyngeal Swab; Nasopharyngeal(NP) swabs in vial transport medium  Result Value Ref Range Status   SARS Coronavirus 2 by RT PCR NEGATIVE NEGATIVE Final    Comment: (NOTE) SARS-CoV-2 target nucleic acids are NOT DETECTED.  The SARS-CoV-2 RNA is generally detectable in upper respiratory specimens during the acute phase of infection. The lowest concentration of SARS-CoV-2 viral copies this assay can detect is 138 copies/mL. A negative result does not preclude SARS-Cov-2 infection and should not be used as the sole basis for treatment or other patient management decisions. A negative result may occur with  improper specimen collection/handling, submission of specimen other than nasopharyngeal swab, presence of viral mutation(s) within the areas targeted by this assay, and inadequate number of viral copies(<138 copies/mL). A negative result must be combined with clinical observations,  patient history, and epidemiological information. The expected result is Negative.  Fact Sheet for Patients:  BloggerCourse.com  Fact Sheet for Healthcare Providers:  SeriousBroker.it  This test is no t yet approved or cleared by the [place] FDA and  has been authorized for detection and/or diagnosis of SARS-CoV-2 by FDA under an Emergency Use Authorization (EUA). This EUA will remain  in effect (meaning this test can be used) for the duration of the COVID-19 declaration under Section 564(b)(1) of the Act, 21 U.S.C.section 360bbb-3(b)(1), unless the authorization is terminated  or revoked sooner.       Influenza A by PCR NEGATIVE NEGATIVE Final   Influenza B by PCR NEGATIVE NEGATIVE Final    Comment: (NOTE) The Xpert Xpress SARS-CoV-2/FLU/RSV plus assay is intended as an aid in the diagnosis of influenza from Nasopharyngeal swab specimens and should not be used as a sole basis for treatment. Nasal washings and aspirates are unacceptable for Xpert Xpress SARS-CoV-2/FLU/RSV testing.  Fact Sheet for Patients: BloggerCourse.com  Fact Sheet for Healthcare Providers: SeriousBroker.it  This test is not yet approved or cleared by the [place] FDA and has been authorized for detection and/or diagnosis of SARS-CoV-2 by FDA under an Emergency Use Authorization (EUA). This EUA will remain in effect (meaning this test can be used) for the duration of the COVID-19 declaration under Section 564(b)(1) of the Act, 21 U.S.C. section 360bbb-3(b)(1), unless the authorization is terminated or revoked.  Performed at [facility], [address] [place], [place] [address]     Radiology Studies: DG C-Arm 1-60 Min  Result Date: 10/17/2020 CLINICAL DATA:  LEFT IM nail EXAM: LEFT FEMUR 2 VIEWS; DG C-ARM 1-60 MIN COMPARISON:  None. FINDINGS: Intraoperative fluoroscopic spot  images of the LEFT femur are provided. IM nail appears appropriately positioned. Fluoroscopy provided for 45 seconds. IMPRESSION: Intraoperative fluoroscopic spot images demonstrating placement of an intramedullary nail. No evidence of surgical complicating feature. Electronically Signed   By: Bary Richard M.D.   On: 10/17/2020 18:47   DG FEMUR MIN 2 VIEWS LEFT  Result Date: 10/17/2020 CLINICAL DATA:  LEFT IM nail EXAM: LEFT FEMUR 2 VIEWS; DG C-ARM 1-60 MIN COMPARISON:  None. FINDINGS: Intraoperative fluoroscopic spot images of the LEFT femur are  provided. IM nail appears appropriately positioned. Fluoroscopy provided for 45 seconds. IMPRESSION: Intraoperative fluoroscopic spot images demonstrating placement of an intramedullary nail. No evidence of surgical complicating feature. Electronically Signed   By: Bary Richard M.D.   On: 10/17/2020 18:47      Laurianne Floresca T. Almee Pelphrey [facility]  If 7PM-7AM, please contact night-coverage [email or web address] 10/18/2020, 11:12 AM

## 2020-10-18 NOTE — Progress Notes (Signed)
   Subjective: 1 Day Post-Op Procedure(s) (LRB): INTRAMEDULLARY (IM) NAIL INTERTROCHANTRIC (Left) Patient reports pain as 4 on 0-10 scale.   Patient is well, and has had no acute complaints or problems Denies any CP, SOB, ABD pain. We will continue therapy today.   Objective: Vital signs in last 24 hours: Temp:  [97.3 F (36.3 C)-99.3 F (37.4 C)] 97.6 F (36.4 C) (03/03 1106) Pulse Rate:  [87-111] 97 (03/03 1106) Resp:  [16-24] 20 (03/03 1106) BP: (85-165)/(59-108) 122/94 (03/03 1106) SpO2:  [93 %-100 %] 94 % (03/03 1106) Weight:  [74.8 kg] 74.8 kg (03/03 0324)  Intake/Output from previous day: 03/02 0701 - 03/03 0700 In: 2044.1 [I.V.:1744.1; IV Piggyback:300] Out: 725 [Urine:625; Blood:100] Intake/Output this shift: Total I/O In: 240 [P.O.:240] Out: -   Recent Labs    10/17/20 1049 10/17/20 2114 10/18/20 0609  HGB 10.2* 14.9 13.7   Recent Labs    10/17/20 2114 10/18/20 0609  WBC 13.1* 10.6*  RBC 4.78 4.46  HCT 43.1 40.1  PLT 315 287   Recent Labs    10/17/20 1049 10/17/20 2114 10/18/20 0609  NA 139  --  136  K 3.6  --  3.7  CL 109  --  105  CO2 23  --  24  BUN 18  --  14  CREATININE 1.02 1.00 0.94  GLUCOSE 147*  --  121*  CALCIUM 8.8*  --  8.0*   Recent Labs    10/17/20 1049  INR 1.1    EXAM General - Patient is Alert, Appropriate and Oriented Extremity - Neurovascular intact Sensation intact distally Intact pulses distally Dorsiflexion/Plantar flexion intact No cellulitis present Compartment soft Dressing - dressing C/D/I and no drainage Motor Function - intact, moving foot and toes well on exam.   Past Medical History:  Diagnosis Date  . GERD (gastroesophageal reflux disease)   . Hiatal hernia     Assessment/Plan:   1 Day Post-Op Procedure(s) (LRB): INTRAMEDULLARY (IM) NAIL INTERTROCHANTRIC (Left) Principal Problem:   Closed left hip fracture (HCC) Active Problems:   Left hip pain   GERD (gastroesophageal reflux disease)    Prolonged QT interval   Elevated troponin   Anemia   Fall  Estimated body mass index is 23.67 kg/m as calculated from the following:   Height as of this encounter: 5\' 10"  (1.778 m).   Weight as of this encounter: 74.8 kg. Advance diet Up with therapy, WBAT LLE Work on BM VSS Labs stable Pain well controlled CM To assist with discharge  DVT Prophylaxis - Lovenox, TED hose and SCDs Weight-Bearing as tolerated to left leg   T. , PA-C [facility] 10/18/2020, 12:18 PM

## 2020-10-18 NOTE — TOC Initial Note (Signed)
Transition of Care [facility]) - Initial/Assessment Note    Patient Details  Name: Casey Rangel MRN: [id number] Date of Birth: Dec 06, 1950  [facility] CM/SW Contact:    Liliana Cline, LCSW Phone Number: 10/18/2020, 2:59 PM  Clinical Narrative:                CSW met with patient at bedside. Patient lives with his sister who is with him 24/7. Patient does not have a PCP and said he doesn't need/want one. Patient orders over the counter meds from [employer]. Patient has a cane. No HH or SNF history. Patient typically drives himself to appointments. Patient has not had his COVID vaccines. Patient declines SNF, but is agreeable to [facility] and has no agency preference. Referral made to Advanced Representative Barbara Cower. Patient agreeable to RW recommendation, ordered RW through [employer]. Patient denied additional needs at this time.   Expected Discharge Plan: Home w Home Health Services Barriers to Discharge: Continued Medical Work up   Patient Goals and CMS Choice Patient states their goals for this hospitalization and ongoing recovery are:: home with home health CMS Medicare.gov Compare Post Acute Care list provided to:: Patient Choice offered to / list presented to : Patient  Expected Discharge Plan and Services Expected Discharge Plan: Home w Home Health Services       Living arrangements for the past 2 months: Single Family Home                 DME Arranged: Walker rolling DME Agency: [employer] Date DME Agency Contacted: 10/18/20   Representative spoke with at DME Agency: Santina Evans HH Arranged: PT,OT HH Agency: [facility] Date HH Agency Contacted: 10/18/20   Representative spoke with at [facility] Agency: Feliberto Gottron  Prior Living Arrangements/Services Living arrangements for the past 2 months: Single Family Home Lives with:: Siblings Patient language and need for interpreter reviewed:: Yes Do you feel safe going back to  the place where you live?: Yes      Need for Family Participation in Patient Care: Yes (Comment) Care giver support system in place?: Yes (comment)   Criminal Activity/Legal Involvement Pertinent to Current Situation/Hospitalization: No - Comment as needed  Activities of Daily Living Home Assistive Devices/Equipment: None ADL Screening (condition at time of admission) Patient's cognitive ability adequate to safely complete daily activities?: Yes Is the patient deaf or have difficulty hearing?: No Does the patient have difficulty seeing, even when wearing glasses/contacts?: No Does the patient have difficulty concentrating, remembering, or making decisions?: No Patient able to express need for assistance with ADLs?: Yes Does the patient have difficulty dressing or bathing?: No Independently performs ADLs?: Yes (appropriate for developmental age) Does the patient have difficulty walking or climbing stairs?: No Weakness of Legs: Left Weakness of Arms/Hands: None  Permission Sought/Granted Permission sought to share information with : Facility Industrial/product designer granted to share information with : Yes, Verbal Permission Granted     Permission granted to share info w AGENCY: HH, DME agencies        Emotional Assessment       Orientation: : Oriented to Self,Oriented to Place,Oriented to  Time,Oriented to Situation Alcohol / Substance Use: Not Applicable Psych Involvement: No (comment)  Admission diagnosis:  Fall [W19.XXXA] Closed left hip fracture (HCC) [S72.002A] Closed left hip fracture, initial encounter [facility]) [S72.002A] Patient Active Problem List   Diagnosis Date Noted  . Closed left hip fracture (HCC) 10/17/2020  . Left hip pain 10/17/2020  .  GERD (gastroesophageal reflux disease)   . Prolonged QT interval   . Elevated troponin   . Anemia   . Fall    PCP:  Patient, No Pcp Per Pharmacy:  No Pharmacies Listed    Social Determinants of Health (SDOH)  Interventions    Readmission Risk Interventions No flowsheet data found.

## 2020-10-18 NOTE — Care Management Important Message (Signed)
Important Message  Patient Details  Name: Casey Rangel MRN: [id number] Date of Birth: 09-Dec-1950   Medicare Important Message Given:  N/A - LOS <3 / Initial given by admissions     Johnell Comings 10/18/2020, 7:16 PM

## 2020-10-18 NOTE — Progress Notes (Signed)
Physical Therapy Treatment Patient Details Name: Casey Rangel MRN: [id number] DOB: Apr 25, 1951 Today's Date: 10/18/2020    History of Present Illness 70 y.o. male with history significant for GERD, hiatal hernia, who is admitted to [facility] on 10/17/2020 with acute left intertrochanteric hip fracture after fall.  IM nailing 10/17/20.    PT Comments    Pt still reporting minimal pain at rest, but with even minimal range light exercises has significant grimacing and pain. He was much more tolerant of WBing during standing/gait training and did mange to go ~5 ft, but remains very reliant on UEs and quick to unweight L LE.    Follow Up Recommendations  SNF     Equipment Recommendations  Rolling walker with 5" wheels (TBD at next venue of care)    Recommendations for Other Services       Precautions / Restrictions Precautions Precautions: Fall Restrictions Weight Bearing Restrictions: Yes LLE Weight Bearing: Weight bearing as tolerated    Mobility  Bed Mobility Overal bed mobility: Needs Assistance Bed Mobility: Sit to Supine     Supine to sit: Mod assist Sit to supine: Mod assist   General bed mobility comments: Pt able to give some light assist, but ultimately PT did bulk of the work to/from supine    Transfers Overall transfer level: Needs assistance Equipment used: Rolling walker (2 wheeled) Transfers: Sit to/from Stand Sit to Stand: From elevated surface;Min assist Stand pivot transfers: Mod assist       General transfer comment: Pt showed effort but still needing physical assist to attain standing, elevated surface did help  Ambulation/Gait Ambulation/Gait assistance: Mod assist Gait Distance (Feet): 5 Feet Assistive device: Rolling walker (2 wheeled)       General Gait Details: Pt still very jesitant with WBing on the L but did manage to take multiple side and forward steps with much better safety than this AM.  Still extremely reliant  on UEs during stance phase but did not need PT to assist with body weight assistance to allow weight shift.   Stairs             Wheelchair Mobility    Modified Rankin (Stroke Patients Only)       Balance Overall balance assessment: Needs assistance Sitting-balance support: Bilateral upper extremity supported Sitting balance-Leahy Scale: Fair     Standing balance support: Bilateral upper extremity supported Standing balance-Leahy Scale: Fair Standing balance comment: highly reliant on walker, hesitant to weigth shift L and lacking confidence t/o standing                            Cognition Arousal/Alertness: Awake/alert Behavior During Therapy: WFL for tasks assessed/performed Overall Cognitive Status: No family/caregiver present to determine baseline cognitive functioning                                 General Comments: Cues t/o and poor insight into deficits      Exercises General Exercises - Lower Extremity Ankle Circles/Pumps: AROM;10 reps Quad Sets: Strengthening;10 reps Short Arc Quad: AAROM;AROM;10 reps Heel Slides: AAROM;10 reps Hip ABduction/ADduction: AAROM;AROM;10 reps (improved tolerance this afternoon) Straight Leg Raises: AAROM;5 reps Other Exercises Other Exercises: Pt educated re: OT role, DME recs, d/c recs, falls prevention, ECS, HEP (IS) Other Exercises: LBD, toileting, sit<>stand, sitting/standing balance/tolerance, SPT, sit>sup    General Comments  Pertinent Vitals/Pain Pain Assessment: 0-10 Pain Score: 6  Faces Pain Scale: Hurts little more Pain Location: L hip Pain Descriptors / Indicators: Dull;Discomfort;Grimacing;Operative site guarding Pain Intervention(s): Limited activity within patient's tolerance;Repositioned    Home Living Family/patient expects to be discharged to:: Skilled nursing facility Living Arrangements: Other relatives (sister)                  Prior Function Level of  Independence: Independent      Comments: Pt reports that he was able to be out driving, running errands and generally staying active   PT Goals (current goals can now be found in the care plan section) Acute Rehab PT Goals Patient Stated Goal: get back to walking PT Goal Formulation: With patient Time For Goal Achievement: 11/01/20 Potential to Achieve Goals: Fair Progress towards PT goals: Progressing toward goals    Frequency    BID      PT Plan Current plan remains appropriate    Co-evaluation              AM-PAC PT "6 Clicks" Mobility   Outcome Measure  Help needed turning from your back to your side while in a flat bed without using bedrails?: A Lot Help needed moving from lying on your back to sitting on the side of a flat bed without using bedrails?: A Lot Help needed moving to and from a bed to a chair (including a wheelchair)?: A Lot Help needed standing up from a chair using your arms (e.g., wheelchair or bedside chair)?: A Lot Help needed to walk in hospital room?: Total Help needed climbing 3-5 steps with a railing? : Total 6 Click Score: 10    End of Session Equipment Utilized During Treatment: Gait belt Activity Tolerance: Patient limited by pain Patient left: with chair alarm set;with call bell/phone within reach Nurse Communication: Mobility status PT Visit Diagnosis: Muscle weakness (generalized) (M62.81);Difficulty in walking, not elsewhere classified (R26.2)     Time: 1610-9604 PT Time Calculation (min) (ACUTE ONLY): 28 min  Charges:  $Gait Training: 8-22 mins $Therapeutic Exercise: 8-22 mins $Therapeutic Activity: 8-22 mins                     Malachi Pro, DPT 10/18/2020, 3:15 PM

## 2020-10-18 NOTE — Evaluation (Signed)
Physical Therapy Evaluation Patient Details Name: Casey Rangel MRN: [id number] DOB: [birth year] Today's Date: 10/18/2020   History of Present Illness  70 y.o. male with history significant for GERD, hiatal hernia, who is admitted to [facility] on 10/17/2020 with acute left intertrochanteric hip fracture after fall.  IM nailing 10/17/20.  Clinical Impression  Pt was willing to work with PT and did show good effort but he was pain limited t/o the session (despite reporting minimal pain, "more soreness").  He struggled with exercises involving all L hip movement, showed little ability to initiate transitions to sitting or standing and tolerated only very little standing/WBing/ambulation before reporting he can't do more and needed to sit.  O2 in the 90s on room air t/o session, HR generally staying below 100 with activity.    Follow Up Recommendations SNF    Equipment Recommendations  Rolling walker with 5" wheels (TBD at next venue of care)    Recommendations for Other Services       Precautions / Restrictions Precautions Precautions: Fall Restrictions Weight Bearing Restrictions: Yes LLE Weight Bearing: Weight bearing as tolerated      Mobility  Bed Mobility Overal bed mobility: Needs Assistance Bed Mobility: Supine to Sit     Supine to sit: Mod assist     General bed mobility comments: Pt able to initiate some movement toward EOB, but ultimately needed considerable assist and effort to attain sitting    Transfers Overall transfer level: Needs assistance Equipment used: Rolling walker (2 wheeled) Transfers: Sit to/from Stand Sit to Stand: From elevated surface;Mod assist         General transfer comment: Pt unable to get hips off of bed at all, even from elevated surface.  He did show good effort but needed heavy assist to initiate and complete the transition to standing  Ambulation/Gait Ambulation/Gait assistance: Mod assist Gait Distance (Feet):  3 Feet Assistive device: Rolling walker (2 wheeled)       General Gait Details: Pt very hesitant to take much weight through L and even with some assist from PT to [facility] during L stance phase he only tolerated a few very guarded small steps before needing to sit down.  Stairs            Wheelchair Mobility    Modified Rankin (Stroke Patients Only)       Balance Overall balance assessment: Needs assistance Sitting-balance support: Bilateral upper extremity supported Sitting balance-Leahy Scale: Fair     Standing balance support: Bilateral upper extremity supported Standing balance-Leahy Scale: Poor Standing balance comment: highly reliant on walker, hesitant to weigth shift L and lacking confidence t/o standing                             Pertinent Vitals/Pain Pain Assessment: 0-10 Pain Score: 4  (reports increased to 7-8/10 with activity)    Home Living Family/patient expects to be discharged to:: Skilled nursing facility Living Arrangements: Other relatives (sister)                    Prior Function Level of Independence: Independent         Comments: Pt reports that he was able to be out driving, running errands and generally staying active     Hand Dominance        Extremity/Trunk Assessment   Upper Extremity Assessment Upper Extremity Assessment: Overall WFL for tasks assessed    Lower Extremity  Assessment Lower Extremity Assessment:  (expected L LE post-op weakness, unable to SLR)       Communication   Communication: No difficulties  Cognition Arousal/Alertness: Awake/alert Behavior During Therapy: WFL for tasks assessed/performed Overall Cognitive Status: Difficult to assess                                 General Comments: Pt oriented to situation, etc but struggled to follow some simple instructions or needing extra cuing to stay on task      General Comments      Exercises General Exercises -  Lower Extremity Ankle Circles/Pumps: AROM;10 reps Quad Sets: Strengthening;10 reps Short Arc Quad: AAROM;AROM;10 reps Heel Slides: AAROM;10 reps (unable to initiate movement w/o assist) Hip ABduction/ADduction: AAROM;AROM;10 reps Straight Leg Raises: AAROM;5 reps   Assessment/Plan    PT Assessment Patient needs continued PT services  PT Problem List Decreased strength;Decreased range of motion;Decreased activity tolerance;Decreased balance;Decreased mobility;Decreased cognition;Decreased knowledge of use of DME;Decreased safety awareness;Pain       PT Treatment Interventions DME instruction;Gait training;Functional mobility training;Therapeutic activities;Neuromuscular re-education;Therapeutic exercise;Balance training;Cognitive remediation;Patient/family education    PT Goals (Current goals can be found in the Care Plan section)  Acute Rehab PT Goals Patient Stated Goal: get back to walking PT Goal Formulation: With patient Time For Goal Achievement: 11/01/20 Potential to Achieve Goals: Fair    Frequency BID   Barriers to discharge        Co-evaluation               AM-PAC PT "6 Clicks" Mobility  Outcome Measure Help needed turning from your back to your side while in a flat bed without using bedrails?: A Lot Help needed moving from lying on your back to sitting on the side of a flat bed without using bedrails?: A Lot Help needed moving to and from a bed to a chair (including a wheelchair)?: A Lot Help needed standing up from a chair using your arms (e.g., wheelchair or bedside chair)?: A Lot Help needed to walk in hospital room?: Total Help needed climbing 3-5 steps with a railing? : Total 6 Click Score: 10    End of Session Equipment Utilized During Treatment: Gait belt Activity Tolerance: Patient limited by pain Patient left: with chair alarm set;with call bell/phone within reach Nurse Communication: Mobility status PT Visit Diagnosis: Muscle weakness  (generalized) (M62.81);Difficulty in walking, not elsewhere classified (R26.2)    Time: 1005-1040 PT Time Calculation (min) (ACUTE ONLY): 35 min   Charges:   PT Evaluation $PT Eval Low Complexity: 1 Low PT Treatments $Therapeutic Exercise: 8-22 mins $Therapeutic Activity: 8-22 mins        Malachi Pro, DPT 10/18/2020, 1:46 PM

## 2020-10-18 NOTE — Evaluation (Signed)
Occupational Therapy Evaluation Patient Details Name: Casey Rangel MRN: [id number] DOB: Dec 25, 1950 Today's Date: 10/18/2020    History of Present Illness 70 y.o. male with history significant for GERD, hiatal hernia, who is admitted to [facility] on 10/17/2020 with acute left intertrochanteric hip fracture after fall.  IM nailing 10/17/20.   Clinical Impression   Casey Rangel was seen for OT evaluation this date. Prior to hospital admission, Casey Rangel was Independent for mobility and ADLs including driving. Casey Rangel lives c sister who is available PRN. Casey Rangel presents to acute OT demonstrating impaired ADL performance and functional mobility 2/2 decreased activity tolerance, poor insight into deficits, and functional strength/ROM/balance deficits. Casey Rangel currently requires SUPERVISION urinal use seated EOC. MAX A for LBD reclined in chair. MOD A + RW for BSC t/f. Casey Rangel would benefit from skilled OT to address noted impairments and functional limitations (see below for any additional details) in order to maximize safety and independence while minimizing falls risk and caregiver burden. Upon hospital discharge, recommend STR to maximize Casey Rangel safety and return to PLOF.     Follow Up Recommendations  SNF    Equipment Recommendations  Other (comment) (TBD)    Recommendations for Other Services       Precautions / Restrictions Precautions Precautions: Fall Restrictions Weight Bearing Restrictions: Yes LLE Weight Bearing: Weight bearing as tolerated      Mobility Bed Mobility Overal bed mobility: Needs Assistance Bed Mobility: Sit to Supine     Sit to supine: Mod assist   General bed mobility comments: assist for LLE mgmt    Transfers Overall transfer level: Needs assistance Equipment used: Rolling walker (2 wheeled) Transfers: Sit to/from [employer] Sit to Stand: Mod assist Stand pivot transfers: Mod assist          Balance Overall balance assessment: Needs  assistance Sitting-balance support: Bilateral upper extremity supported Sitting balance-Leahy Scale: Fair     Standing balance support: Bilateral upper extremity supported Standing balance-Leahy Scale: Poor Standing balance comment: highly reliant on walker, hesitant to weigth shift L and lacking confidence t/o standing                           ADL either performed or assessed with clinical judgement   ADL Overall ADL's : Needs assistance/impaired                                       General ADL Comments: SUPERVISION urinal use seated EOC. MAX A for LBD reclined in chair. MOD A + RW for BSC t/f                  Pertinent Vitals/Pain Pain Assessment: Faces Faces Pain Scale: Hurts little more Pain Location: L hip Pain Descriptors / Indicators: Dull;Discomfort;Grimacing;Operative site guarding Pain Intervention(s): Limited activity within patient's tolerance;Repositioned     Hand Dominance     Extremity/Trunk Assessment Upper Extremity Assessment Upper Extremity Assessment: Overall WFL for tasks assessed   Lower Extremity Assessment Lower Extremity Assessment: Generalized weakness       Communication Communication Communication: No difficulties   Cognition Arousal/Alertness: Awake/alert Behavior During Therapy: WFL for tasks assessed/performed Overall Cognitive Status: No family/caregiver present to determine baseline cognitive functioning  General Comments: Cues t/o and poor insight into deficits   General Comments       Exercises Exercises: Other exercises Other Exercises Other Exercises: Casey Rangel educated re: OT role, DME recs, d/c recs, falls prevention, ECS, HEP (IS) Other Exercises: LBD, toileting, sit<>stand, sitting/standing balance/tolerance, SPT, sit>sup   Shoulder Instructions      Home Living Family/patient expects to be discharged to:: Skilled nursing facility Living  Arrangements: Other relatives (sister)                                      Prior Functioning/Environment Level of Independence: Independent        Comments: Casey Rangel reports that he was able to be out driving, running errands and generally staying active        OT Problem List: Decreased strength;Decreased range of motion;Decreased activity tolerance;Impaired balance (sitting and/or standing);Decreased safety awareness;Pain      OT Treatment/Interventions: Self-care/ADL training;Therapeutic exercise;Energy conservation;DME and/or AE instruction;Therapeutic activities;Balance training;Patient/family education    OT Goals(Current goals can be found in the care plan section) Acute Rehab OT Goals Patient Stated Goal: get back to walking OT Goal Formulation: With patient Time For Goal Achievement: 11/01/20 Potential to Achieve Goals: Good ADL Goals Casey Rangel Will Perform Grooming: with supervision;standing (c LRAD PRN) Casey Rangel Will Perform Lower Body Dressing: with min assist;sitting/lateral leans Casey Rangel Will Transfer to Toilet: with min guard assist;ambulating;bedside commode (c LRAD PRN)  OT Frequency: Min 1X/week   Barriers to D/C: Inaccessible home environment;Decreased caregiver support             AM-PAC OT "6 Clicks" Daily Activity     Outcome Measure Help from another person eating meals?: None Help from another person taking care of personal grooming?: A Little Help from another person toileting, which includes using toliet, bedpan, or urinal?: A Lot Help from another person bathing (including washing, rinsing, drying)?: A Little Help from another person to put on and taking off regular upper body clothing?: A Little Help from another person to put on and taking off regular lower body clothing?: A Lot 6 Click Score: 17   End of Session Equipment Utilized During Treatment: Rolling walker  Activity Tolerance: Patient tolerated treatment well Patient left: in bed;with call  bell/phone within reach;with bed alarm set  OT Visit Diagnosis: Unsteadiness on feet (R26.81);Muscle weakness (generalized) (M62.81)                Time: 1610-9604 OT Time Calculation (min): 30 min Charges:  OT General Charges $OT Visit: 1 Visit OT Evaluation $OT Eval Low Complexity: 1 Low OT Treatments $Self Care/Home Management : 23-37 mins  Kathie Dike, M.S. OTR/L  10/18/20, 2:16 PM  ascom [phone]

## 2020-10-18 NOTE — Plan of Care (Signed)

## 2020-10-19 DIAGNOSIS — Z2821 Immunization not carried out because of patient refusal: Secondary | ICD-10-CM

## 2020-10-19 LAB — CBC
HCT: 38.5 % — ABNORMAL LOW (ref 39.0–52.0)
Hemoglobin: 13.6 g/dL (ref 13.0–17.0)
MCH: 31.3 pg (ref 26.0–34.0)
MCHC: 35.3 g/dL (ref 30.0–36.0)
MCV: 88.5 fL (ref 80.0–100.0)
Platelets: 269 10*3/uL (ref 150–400)
RBC: 4.35 MIL/uL (ref 4.22–5.81)
RDW: 12.8 % (ref 11.5–15.5)
WBC: 12.1 10*3/uL — ABNORMAL HIGH (ref 4.0–10.5)
nRBC: 0 % (ref 0.0–0.2)

## 2020-10-19 LAB — RENAL FUNCTION PANEL
Albumin: 3.2 g/dL — ABNORMAL LOW (ref 3.5–5.0)
Anion gap: 8 (ref 5–15)
BUN: 11 mg/dL (ref 8–23)
CO2: 25 mmol/L (ref 22–32)
Calcium: 8.1 mg/dL — ABNORMAL LOW (ref 8.9–10.3)
Chloride: 104 mmol/L (ref 98–111)
Creatinine, Ser: 0.8 mg/dL (ref 0.61–1.24)
GFR, Estimated: >60 mL/min (ref >60)
Glucose, Bld: 111 mg/dL — ABNORMAL HIGH (ref 70–99)
Phosphorus: 2.5 mg/dL (ref 2.5–4.6)
Potassium: 3.5 mmol/L (ref 3.5–5.1)
Sodium: 137 mmol/L (ref 135–145)

## 2020-10-19 LAB — MAGNESIUM: Magnesium: 2 mg/dL (ref 1.7–2.4)

## 2020-10-19 MED ORDER — DOCUSATE SODIUM 100 MG PO CAPS: 100.0000 mg | ORAL_CAPSULE | Freq: Two times a day (BID) | ORAL | 0 refills | Status: DC

## 2020-10-19 MED ORDER — HYDROCODONE-ACETAMINOPHEN 5-325 MG PO TABS: 1.0000 | ORAL_TABLET | ORAL | 0 refills | Status: DC | PRN

## 2020-10-19 MED ORDER — COVID-19 MRNA VAC-TRIS(PFIZER) 30 MCG/0.3ML IM SUSP
0.3000 mL | Freq: Once | INTRAMUSCULAR | Status: DC
  Filled 2020-10-19: qty 0.3

## 2020-10-19 MED ORDER — ENOXAPARIN SODIUM 40 MG/0.4ML ~~LOC~~ SOLN: 40.0000 mg | SUBCUTANEOUS | 0 refills | Status: DC

## 2020-10-19 MED ORDER — VITAMIN D (ERGOCALCIFEROL) 1.25 MG (50000 UNIT) PO CAPS
50000.0000 [IU] | ORAL_CAPSULE | ORAL | Status: DC
  Administered 2020-10-19: 50000 [IU] via ORAL
  Filled 2020-10-19: qty 1

## 2020-10-19 NOTE — Anesthesia Postprocedure Evaluation (Signed)
Anesthesia Post Note  Patient: Casey Rangel  Procedure(s) Performed: INTRAMEDULLARY (IM) NAIL INTERTROCHANTRIC (Left Hip)  Patient location during evaluation: Nursing Unit Anesthesia Type: Spinal Level of consciousness: awake and alert Pain management: pain level controlled Vital Signs Assessment: post-procedure vital signs reviewed and stable Respiratory status: spontaneous breathing and respiratory function stable Cardiovascular status: blood pressure returned to baseline and stable Postop Assessment: no headache, no backache, no apparent nausea or vomiting and patient able to bend at knees Anesthetic complications: no   No complications documented.   Last Vitals:  Vitals:   10/18/20 2326 10/19/20 0429  BP: (!) 156/93 (!) 147/96  Pulse: 99 95  Resp: 18 17  Temp: (!) 36.3 C 36.4 C  SpO2: 94% 95%    Last Pain:  Vitals:   10/18/20 2049  TempSrc:   PainSc: 0-No pain                 Lynden Oxford

## 2020-10-19 NOTE — NC FL2 (Signed)
[place] MEDICAID FL2 LEVEL OF CARE SCREENING TOOL     IDENTIFICATION  Patient Name: Casey Rangel Birthdate: Sep 02, 1950 Sex: male Admission Date (Current Location): 10/17/2020  [place] and [place] Number:  Chiropodist and Address:  [facility] [address], [address], [place], [place] [address]      Provider Number: [id number]  Attending Physician Name and Address:  Almon Hercules, MD  Relative Name and Phone Number:  Hazle Quant   [phone]    Current Level of Care: Hospital Recommended Level of Care: Skilled Nursing Facility Prior Approval Number:    Date Approved/Denied:   PASRR Number: [id number]  Discharge Plan:      Current Diagnoses: Patient Active Problem List   Diagnosis Date Noted  . Closed left hip fracture (HCC) 10/17/2020  . Left hip pain 10/17/2020  . GERD (gastroesophageal reflux disease)   . Prolonged QT interval   . Elevated troponin   . Anemia   . Fall     Orientation RESPIRATION BLADDER Height & Weight     Self,Time,Situation,Place  Normal Continent Weight: 201 lb 14.4 oz (91.6 kg) Height:  5\' 10"  (177.8 cm)  BEHAVIORAL SYMPTOMS/MOOD NEUROLOGICAL BOWEL NUTRITION STATUS      Continent Diet (regular diet, thin liquids)  AMBULATORY STATUS COMMUNICATION OF NEEDS Skin   Extensive Assist Verbally  (closed incisions L leg, hip, and knee)                       Personal Care Assistance Level of Assistance  Bathing,Feeding,Dressing Bathing Assistance: Maximum assistance Feeding assistance: Limited assistance Dressing Assistance: Maximum assistance     Functional Limitations Info             SPECIAL CARE FACTORS FREQUENCY  PT (By licensed PT),OT (By licensed OT)     PT Frequency: 5 x/week OT Frequency: 5 x/week            Contractures      Additional Factors Info  Code Status,Allergies Code Status Info: full code Allergies Info: penicillins           Current Medications (10/19/2020):   This is the current hospital active medication list Current Facility-Administered Medications  Medication Dose Route Frequency Provider Last Rate Last Admin  . acetaminophen (TYLENOL) tablet 325-650 mg  325-650 mg Oral Q6H PRN Kennedy Bucker, MD      . alum & mag hydroxide-simeth (MAALOX/MYLANTA) 200-200-20 MG/5ML suspension 30 mL  30 mL Oral Q4H PRN Kennedy Bucker, MD      . bisacodyl (DULCOLAX) suppository 10 mg  10 mg Rectal Daily PRN Kennedy Bucker, MD      . COVID-19 mRNA Vac-TriS ([employer]) injection 0.3 mL  0.3 mL Intramuscular Once Candelaria Stagers T, MD      . docusate sodium (COLACE) capsule 100 mg  100 mg Oral BID Kennedy Bucker, MD   100 mg at 10/19/20 6433  . enoxaparin (LOVENOX) injection 40 mg  40 mg Subcutaneous Q24H Kennedy Bucker, MD   40 mg at 10/19/20 2951  . fentaNYL (SUBLIMAZE) injection 25 mcg  25 mcg Intravenous Q2H PRN Howerter, Justin B, DO   25 mcg at 10/17/20 1633  . hydrALAZINE (APRESOLINE) tablet 25 mg  25 mg Oral Q6H PRN Almon Hercules, MD      . HYDROcodone-acetaminophen (NORCO) 7.5-325 MG per tablet 1-2 tablet  1-2 tablet Oral Q4H PRN Kennedy Bucker, MD      . HYDROcodone-acetaminophen (NORCO/VICODIN) 5-325 MG per tablet 1-2 tablet  1-2 tablet Oral Q4H PRN Kennedy Bucker, MD   1 tablet at 10/18/20 1046  . LORazepam (ATIVAN) injection 0.5 mg  0.5 mg Intravenous Q6H PRN Howerter, Justin B, DO      . magnesium citrate solution 1 Bottle  1 Bottle Oral Once PRN Kennedy Bucker, MD      . magnesium hydroxide (MILK OF MAGNESIA) suspension 30 mL  30 mL Oral Daily PRN Kennedy Bucker, MD      . menthol-cetylpyridinium (CEPACOL) lozenge 3 mg  1 lozenge Oral PRN Kennedy Bucker, MD       Or  . phenol (CHLORASEPTIC) mouth spray 1 spray  1 spray Mouth/Throat PRN Kennedy Bucker, MD      . methocarbamol (ROBAXIN) tablet 500 mg  500 mg Oral Q6H PRN Kennedy Bucker, MD       Or  . methocarbamol (ROBAXIN) 500 mg in dextrose 5 % 50 mL IVPB  500 mg Intravenous Q6H PRN Kennedy Bucker, MD      . morphine 2  MG/ML injection 0.5-1 mg  0.5-1 mg Intravenous Q2H PRN Kennedy Bucker, MD      . pantoprazole (PROTONIX) EC tablet 40 mg  40 mg Oral Daily Candelaria Stagers T, MD   40 mg at 10/19/20 0952  . Vitamin D (Ergocalciferol) (DRISDOL) capsule 50,000 Units  50,000 Units Oral Q7 days Candelaria Stagers T, MD      . zolpidem (AMBIEN) tablet 5 mg  5 mg Oral QHS PRN Kennedy Bucker, MD   5 mg at 10/18/20 2048     Discharge Medications: Please see discharge summary for a list of discharge medications.  Relevant Imaging Results:  Relevant Lab Results:   Additional Information SS #: [id number]  Meagan E Hagwood, LCSW

## 2020-10-19 NOTE — Progress Notes (Signed)
PROGRESS NOTE  Carlson Phoebus ONG:[id number] DOB: 1950-12-09   PCP: Patient, No Pcp Per  Patient is from: Home.  Independently ambulates at baseline.  DOA: 10/17/2020 LOS: 2  Chief complaints: Left hip pain  Brief Narrative / Interim history: 70 year old M with no significant PMH other than GERD and hiatal hernia brought to ED by EMS after he had accidental fall and left hip fracture.  He underwent intramedullary nailing by Dr. Rosita Kea on 10/18/2020.  Therapy recommended SNF.  Subjective: Seen and examined earlier this morning.  No major events overnight of this morning.  Feels sore after working with therapy this morning.  Denies chest pain, dyspnea, GI or UTI symptoms.  He is not vaccinated against COVID-19 but willing to have one here.  Objective: Vitals:   10/19/20 0429 10/19/20 0512 10/19/20 0748 10/19/20 1213  BP: (!) 147/96  (!) 140/96 (!) 125/93  Pulse: 95  96 (!) 110  Resp: 17  16 16   Temp: 97.6 F (36.4 C)  98.1 F (36.7 C) 97.7 F (36.5 C)  TempSrc:    Oral  SpO2: 95%  95% 100%  Weight:  91.6 kg    Height:        Intake/Output Summary (Last 24 hours) at 10/19/2020 1342 Last data filed at 10/19/2020 0515 Gross per 24 hour  Intake --  Output 950 ml  Net -950 ml   Filed Weights   10/17/20 1700 10/18/20 0324 10/19/20 0512  Weight: 74.8 kg 74.8 kg 91.6 kg    Examination:  GENERAL: No apparent distress.  Nontoxic. HEENT: MMM.  Vision and hearing grossly intact.  NECK: Supple.  No apparent JVD.  RESP: On RA.  No IWOB.  Fair aeration bilaterally. CVS:  RRR. Heart sounds normal.  ABD/GI/GU: BS+. Abd soft, NTND.  MSK/EXT:  Moves extremities. No apparent deformity. No edema.  SKIN: Dressing over left hip and DCI. NEURO: Awake, alert and oriented appropriately.  No apparent focal neuro deficit. PSYCH: Calm. Normal affect.   Procedures:  10/18/2020-intramedullary nailing of left intertrochanteric fracture  Microbiology summarized: COVID-19 and influenza PCR  nonreactive.  Assessment & Plan: Accidental fall at home-stepped off the curb and fell on concrete surface Acute left intertrochanteric hip fracture-s/p intramedullary nailing by Dr. Rosita Kea on 10/18/2020 Vitamin D insufficiency -Pain control per orthopedic surgery. -Ortho recommends subcu Lovenox for 2 weeks on discharge for VTE prophylaxis. -Vitamin D supplementation -PT/OT-recommended SNF.  Prolonged QT-490 on initial EKG and improved to 469. -Avoid or minimize QT prolonging drugs  Elevated blood pressure: No history of HTN.  Could be due to pain.  Improved. -As needed hydralazine with parameters  Elevated troponin-likely demand ischemia.  Noted some T wave flattening on his EKG but patient without chest pain or cardiopulmonary symptoms.  No risk factors other than his age.  GERD/hiatal hernia -PPI  COVID-19 vaccination-unvaccinated, and will to have one while here -Ordered first vaccine.  Needs repeat in 3-4 weeks   Body mass index is 28.97 kg/m.         DVT prophylaxis:  enoxaparin (LOVENOX) injection 40 mg Start: 10/18/20 0800 SCDs Start: 10/17/20 1836 Place TED hose Start: 10/17/20 1836 SCDs Start: 10/17/20 1341  Code Status: Full code Family Communication: Patient and/or RN. Available if any question.  Level of care: Med-Surg Status is: Inpatient  Remains inpatient appropriate because:Unsafe d/c plan and Inpatient level of care appropriate due to severity of illness   Dispo: The patient is from: Home  Anticipated d/c is to: SNF              Patient currently is not medically stable to d/c.   Difficult to place patient No       Consultants:  Orthopedic surgery   Sch Meds:  Scheduled Meds: . COVID-19 mRNA Vac-TriS (Pfizer)  0.3 mL Intramuscular Once  . docusate sodium  100 mg Oral BID  . enoxaparin (LOVENOX) injection  40 mg Subcutaneous Q24H  . pantoprazole  40 mg Oral Daily  . Vitamin D (Ergocalciferol)  50,000 Units Oral Q7 days    Continuous Infusions: . methocarbamol (ROBAXIN) IV     PRN Meds:.acetaminophen, alum & mag hydroxide-simeth, bisacodyl, fentaNYL (SUBLIMAZE) injection, hydrALAZINE, HYDROcodone-acetaminophen, HYDROcodone-acetaminophen, LORazepam, magnesium citrate, magnesium hydroxide, menthol-cetylpyridinium **OR** phenol, methocarbamol **OR** methocarbamol (ROBAXIN) IV, morphine injection, zolpidem  Antimicrobials: Anti-infectives (From admission, onward)   Start     Dose/Rate Route Frequency Ordered Stop   10/17/20 2300  ceFAZolin (ANCEF) IVPB 2g/100 mL premix        2 g 200 mL/hr over 30 Minutes Intravenous Every 6 hours 10/17/20 2104 10/18/20 1118   10/17/20 1800  ceFAZolin (ANCEF) IVPB 2g/100 mL premix        2 g 200 mL/hr over 30 Minutes Intravenous  Once 10/17/20 1250 10/17/20 1722   10/17/20 1654  ceFAZolin (ANCEF) 2-4 GM/100ML-% IVPB       Note to Pharmacy: Register, Karen   : cabinet override      10/17/20 1654 10/17/20 1742       I have personally reviewed the following labs and images: CBC: Recent Labs  Lab 10/17/20 1049 10/17/20 2114 10/18/20 0609 10/19/20 0728  WBC 6.6 13.1* 10.6* 12.1*  NEUTROABS 4.8  --   --   --   HGB 10.2* 14.9 13.7 13.6  HCT 33.7* 43.1 40.1 38.5*  MCV 79.7* 90.2 89.9 88.5  PLT 378 315 287 269   BMP &GFR Recent Labs  Lab 10/17/20 1049 10/17/20 1333 10/17/20 2114 10/18/20 0609 10/19/20 0728  NA 139  --   --  136 137  K 3.6  --   --  3.7 3.5  CL 109  --   --  105 104  CO2 23  --   --  24 25  GLUCOSE 147*  --   --  121* 111*  BUN 18  --   --  14 11  CREATININE 1.02  --  1.00 0.94 0.80  CALCIUM 8.8*  --   --  8.0* 8.1*  MG  --  2.3  --  1.9 2.0  PHOS  --   --   --   --  2.5   Estimated Creatinine Clearance: 99.1 mL/min (by C-G formula based on SCr of 0.8 mg/dL). Liver & Pancreas: Recent Labs  Lab 10/17/20 1049 10/19/20 0728  AST 19  --   ALT 13  --   ALKPHOS 54  --   BILITOT 0.9  --   PROT 7.1  --   ALBUMIN 3.8 3.2*   No results  for input(s): LIPASE, AMYLASE in the last 168 hours. No results for input(s): AMMONIA in the last 168 hours. Diabetic: No results for input(s): HGBA1C in the last 72 hours. No results for input(s): GLUCAP in the last 168 hours. Cardiac Enzymes: No results for input(s): CKTOTAL, CKMB, CKMBINDEX, TROPONINI in the last 168 hours. No results for input(s): PROBNP in the last 8760 hours. Coagulation Profile: Recent Labs  Lab 10/17/20 1049  INR 1.1  Thyroid Function Tests: No results for input(s): TSH, T4TOTAL, FREET4, T3FREE, THYROIDAB in the last 72 hours. Lipid Profile: No results for input(s): CHOL, HDL, LDLCALC, TRIG, CHOLHDL, LDLDIRECT in the last 72 hours. Anemia Panel: Recent Labs    10/17/20 1049  FOLATE 10.4  FERRITIN 168  TIBC 281  IRON 45  RETICCTPCT 1.5   Urine analysis:    Component Value Date/Time   COLORURINE YELLOW (A) 10/17/2020 1134   APPEARANCEUR HAZY (A) 10/17/2020 1134   LABSPEC 1.023 10/17/2020 1134   PHURINE 5.0 10/17/2020 1134   GLUCOSEU NEGATIVE 10/17/2020 1134   HGBUR NEGATIVE 10/17/2020 1134   BILIRUBINUR NEGATIVE 10/17/2020 1134   KETONESUR NEGATIVE 10/17/2020 1134   PROTEINUR NEGATIVE 10/17/2020 1134   NITRITE NEGATIVE 10/17/2020 1134   LEUKOCYTESUR NEGATIVE 10/17/2020 1134   Sepsis Labs: Invalid input(s): PROCALCITONIN, LACTICIDVEN  Microbiology: Recent Results (from the past 240 hour(s))  Resp Panel by RT-PCR (Flu A&B, Covid) Nasopharyngeal Swab     Status: None   Collection Time: 10/17/20  3:39 PM   Specimen: Nasopharyngeal Swab; Nasopharyngeal(NP) swabs in vial transport medium  Result Value Ref Range Status   SARS Coronavirus 2 by RT PCR NEGATIVE NEGATIVE Final    Comment: (NOTE) SARS-CoV-2 target nucleic acids are NOT DETECTED.  The SARS-CoV-2 RNA is generally detectable in upper respiratory specimens during the acute phase of infection. The lowest concentration of SARS-CoV-2 viral copies this assay can detect is 138 copies/mL.  A negative result does not preclude SARS-Cov-2 infection and should not be used as the sole basis for treatment or other patient management decisions. A negative result may occur with  improper specimen collection/handling, submission of specimen other than nasopharyngeal swab, presence of viral mutation(s) within the areas targeted by this assay, and inadequate number of viral copies(<138 copies/mL). A negative result must be combined with clinical observations, patient history, and epidemiological information. The expected result is Negative.  Fact Sheet for Patients:  BloggerCourse.com  Fact Sheet for Healthcare Providers:  SeriousBroker.it  This test is no t yet approved or cleared by the [place] FDA and  has been authorized for detection and/or diagnosis of SARS-CoV-2 by FDA under an Emergency Use Authorization (EUA). This EUA will remain  in effect (meaning this test can be used) for the duration of the COVID-19 declaration under Section 564(b)(1) of the Act, 21 U.S.C.section 360bbb-3(b)(1), unless the authorization is terminated  or revoked sooner.       Influenza A by PCR NEGATIVE NEGATIVE Final   Influenza B by PCR NEGATIVE NEGATIVE Final    Comment: (NOTE) The Xpert Xpress SARS-CoV-2/FLU/RSV plus assay is intended as an aid in the diagnosis of influenza from Nasopharyngeal swab specimens and should not be used as a sole basis for treatment. Nasal washings and aspirates are unacceptable for Xpert Xpress SARS-CoV-2/FLU/RSV testing.  Fact Sheet for Patients: BloggerCourse.com  Fact Sheet for Healthcare Providers: SeriousBroker.it  This test is not yet approved or cleared by the [place] FDA and has been authorized for detection and/or diagnosis of SARS-CoV-2 by FDA under an Emergency Use Authorization (EUA). This EUA will remain in effect (meaning this test  can be used) for the duration of the COVID-19 declaration under Section 564(b)(1) of the Act, 21 U.S.C. section 360bbb-3(b)(1), unless the authorization is terminated or revoked.  Performed at [facility], [address], [place], [place] [address]     Radiology Studies: No results found.    Johni Narine T. Saron Tweed [facility]  If 7PM-7AM, please contact  night-coverage [email or web address] 10/19/2020, 1:42 PM

## 2020-10-19 NOTE — Progress Notes (Signed)
Physical Therapy Treatment Patient Details Name: Casey Rangel MRN: [id number] DOB: Oct 16, 1950 Today's Date: 10/19/2020    History of Present Illness 70 y.o. male with history significant for GERD, hiatal hernia, who is admitted to [facility] on 10/17/2020 with acute left intertrochanteric hip fracture after fall.  IM nailing 10/17/20.    PT Comments    Pt continues to require significant assistance for all mobility, and is only able to ambulate 3 ft x 2 trials with RW & min assist but max cuing for sequencing & PT following along with recliner. Pt with pain in LLE & requires AAROM to complete LAQ. Continue to recommend STR upon d/c to maximize independence with functional mobility & reduce fall risk prior to return home.    Follow Up Recommendations  SNF     Equipment Recommendations   (TBD in next venue)    Recommendations for Other Services       Precautions / Restrictions Precautions Precautions: Fall Restrictions Weight Bearing Restrictions: Yes LLE Weight Bearing: Weight bearing as tolerated    Mobility  Bed Mobility Overal bed mobility: Needs Assistance Bed Mobility: Supine to Sit     Supine to sit: HOB elevated;Max assist     General bed mobility comments: Pt able to initiate supine>sit but unable to sit trunk fully upright. Despite PT providing cuing for technique & use of bed rails with HOB elevated pt still requries max assist to complete movement.    Transfers Overall transfer level: Needs assistance Equipment used: Rolling walker (2 wheeled) Transfers: Sit to/from [employer] Sit to Stand: From elevated surface;Mod assist;Min assist Stand pivot transfers: Min assist       General transfer comment: Max cuing for hand placement, extra time to complete transitional movement.  Ambulation/Gait Ambulation/Gait assistance: Min assist Gait Distance (Feet):  (3 ft to recliner then 3 ft forwards) Assistive device: Rolling  walker (2 wheeled) Gait Pattern/deviations: Decreased step length - right;Decreased step length - left;Step-to pattern;Decreased stance time - left;Decreased stride length;Decreased dorsiflexion - left;Decreased dorsiflexion - right Gait velocity: significantly decreased   General Gait Details: Relies heavily on BUE/RLE with decreased weight shift & weight bearing through LLE. Minimal RLE foot clearance when stepping forwards.   Stairs             Wheelchair Mobility    Modified Rankin (Stroke Patients Only)       Balance Overall balance assessment: Needs assistance Sitting-balance support: Bilateral upper extremity supported Sitting balance-Leahy Scale: Fair     Standing balance support: Bilateral upper extremity supported Standing balance-Leahy Scale: Poor Standing balance comment: UE support on RW                            Cognition Arousal/Alertness: Awake/alert Behavior During Therapy: WFL for tasks assessed/performed Overall Cognitive Status: No family/caregiver present to determine baseline cognitive functioning                                 General Comments: Cues t/o and poor insight into deficits, appears HOH but pt denies (pt asks PT to repeat herself multiple times)      Exercises General Exercises - Lower Extremity Long Arc Quad: AAROM;Strengthening;Left;10 reps;Seated    General Comments General comments (skin integrity, edema, etc.): HR 91-104  bpm during session      Pertinent Vitals/Pain Pain Assessment: Faces Faces Pain Scale: Hurts even  more Pain Location: L hip Pain Descriptors / Indicators: Grimacing;Operative site guarding Pain Intervention(s): Monitored during session;Repositioned    Home Living                      Prior Function            PT Goals (current goals can now be found in the care plan section) Acute Rehab PT Goals Patient Stated Goal: get back to walking PT Goal Formulation: With  patient Time For Goal Achievement: 11/01/20 Potential to Achieve Goals: Fair Progress towards PT goals: Progressing toward goals    Frequency    BID      PT Plan Current plan remains appropriate    Co-evaluation              AM-PAC PT "6 Clicks" Mobility   Outcome Measure  Help needed turning from your back to your side while in a flat bed without using bedrails?: A Lot Help needed moving from lying on your back to sitting on the side of a flat bed without using bedrails?: A Lot Help needed moving to and from a bed to a chair (including a wheelchair)?: A Lot Help needed standing up from a chair using your arms (e.g., wheelchair or bedside chair)?: A Lot Help needed to walk in hospital room?: A Lot Help needed climbing 3-5 steps with a railing? : Total 6 Click Score: 11    End of Session Equipment Utilized During Treatment: Gait belt Activity Tolerance: Patient limited by pain;Patient limited by fatigue Patient left: with chair alarm set;with call bell/phone within reach;in chair;with SCD's reapplied Nurse Communication: Mobility status PT Visit Diagnosis: Muscle weakness (generalized) (M62.81);Difficulty in walking, not elsewhere classified (R26.2);Pain Pain - Right/Left: Left Pain - part of body: Hip     Time: 0911-0935 PT Time Calculation (min) (ACUTE ONLY): 24 min  Charges:  $Therapeutic Activity: 23-37 mins                     [name], PT, DPT 10/19/20, 10:36 AM    [name] 10/19/2020, 10:35 AM

## 2020-10-19 NOTE — TOC Progression Note (Signed)
Transition of Care [facility]) - Progression Note    Patient Details  Name: [name] MRN: [id number] Date of Birth: 11/29/1950  Transition of Care [employer]) CM/SW Contact  Liliana Cline, LCSW Phone Number: 10/19/2020, 2:07 PM  Clinical Narrative:   Patient now agreeable to SNF which is covered by Medicare Part A. CSW started SNF work up.    Expected Discharge Plan: Home w Home Health Services Barriers to Discharge: Continued Medical Work up  Expected Discharge Plan and Services Expected Discharge Plan: Home w Home Health Services       Living arrangements for the past 2 months: Single Family Home                 DME Arranged: Walker rolling DME Agency: [employer] Date DME Agency Contacted: 10/18/20   Representative spoke with at DME Agency: Santina Evans HH Arranged: PT,OT HH Agency: [facility] Date HH Agency Contacted: 10/18/20   Representative spoke with at [facility] Agency: Feliberto Gottron   Social Determinants of Health (SDOH) Interventions    Readmission Risk Interventions No flowsheet data found.

## 2020-10-19 NOTE — Progress Notes (Signed)
Physical Therapy Treatment Patient Details Name: Casey Rangel MRN: [id number] DOB: [birth year] Today's Date: 10/19/2020    History of Present Illness 70 y.o. male with history significant for GERD, hiatal hernia, who is admitted to [facility] on 10/17/2020 with acute left intertrochanteric hip fracture after fall.  IM nailing 10/17/20.    PT Comments    Pt received in recliner, reporting attempts to get back to bed without assistance, demonstrating impaired safety awareness. Pt ultimately requires +2 assist for stand pivot recliner>bed as pt with significantly decreased ability to advance BLE nor weight bear through LLE & requires max cuing for sequencing stand pivot transfer. Pt performs LLE strengthening exercises as noted below with instructional cuing for technique. At this time pt is not safe to d/c home and would benefit from STR upon d/c to maximize independence with functional mobility & reduce fall risk prior to return home.   Follow Up Recommendations  SNF     Equipment Recommendations  None recommended by PT    Recommendations for Other Services       Precautions / Restrictions Precautions Precautions: Fall Restrictions Weight Bearing Restrictions: Yes LLE Weight Bearing: Weight bearing as tolerated    Mobility  Bed Mobility Overal bed mobility: Needs Assistance Bed Mobility: Sit to Supine     Supine to sit: HOB elevated;Max assist Sit to supine: Min assist;HOB elevated   General bed mobility comments: assistance to elevate LLE onto bed, max cuing to position himself in center of bed & scoot to [facility] with use of bed rails    Transfers Overall transfer level: Needs assistance Equipment used: Rolling walker (2 wheeled) Transfers: Sit to/from Stand Sit to Stand: Min assist (significantly extra time to complete movement, cuing for hand placement) Stand pivot transfers: Min assist;+2 physical assistance (Pt with poor ability to weight bear through  LLE to advance RLE, even with great difficulty scooting R foot across floor, required +2 assist for safety as pt at times just stands and doesn't attempt to advance LE)       General transfer comment: Max cuing for hand placement, extra time to complete transitional movement.  Ambulation/Gait Ambulation/Gait assistance: Min assist Gait Distance (Feet):  (3 ft to recliner then 3 ft forwards) Assistive device: Rolling walker (2 wheeled) Gait Pattern/deviations: Decreased step length - right;Decreased step length - left;Step-to pattern;Decreased stance time - left;Decreased stride length;Decreased dorsiflexion - left;Decreased dorsiflexion - right Gait velocity: significantly decreased   General Gait Details: Relies heavily on BUE/RLE with decreased weight shift & weight bearing through LLE. Minimal RLE foot clearance when stepping forwards.   Stairs             Wheelchair Mobility    Modified Rankin (Stroke Patients Only)       Balance Overall balance assessment: Needs assistance Sitting-balance support: Bilateral upper extremity supported Sitting balance-Leahy Scale: Fair     Standing balance support: Bilateral upper extremity supported Standing balance-Leahy Scale: Poor Standing balance comment: UE support on RW                            Cognition Arousal/Alertness: Awake/alert Behavior During Therapy: WFL for tasks assessed/performed Overall Cognitive Status: No family/caregiver present to determine baseline cognitive functioning                                 General Comments: Slow processing, follows one step  commands with extra time/cuing; poor safety awareness as pt received in room reporting he was attempting to get out of recliner to bed without assistance (still with BLE SCDs donned)      Exercises General Exercises - Lower Extremity Short Arc Quad: AROM;Strengthening;Left;10 reps;Supine Long Arc Quad:  AAROM;Strengthening;Left;10 reps;Seated Heel Slides: AAROM;Strengthening;Left;10 reps;Supine Hip ABduction/ADduction: AAROM;Strengthening;Left;10 reps;Supine (hip abduction slides)    General Comments General comments (skin integrity, edema, etc.): SpO2 >90% on room air during session but pt breathing heavily at end of session noting fatigue      Pertinent Vitals/Pain Pain Assessment: Faces Faces Pain Scale: Hurts whole lot Pain Location: L hip Pain Descriptors / Indicators: Grimacing;Operative site guarding Pain Intervention(s): Monitored during session    Home Living                      Prior Function            PT Goals (current goals can now be found in the care plan section) Acute Rehab PT Goals Patient Stated Goal: get back to walking PT Goal Formulation: With patient Time For Goal Achievement: 11/01/20 Potential to Achieve Goals: Fair Progress towards PT goals: PT to reassess next treatment    Frequency    BID      PT Plan Current plan remains appropriate    Co-evaluation              AM-PAC PT "6 Clicks" Mobility   Outcome Measure  Help needed turning from your back to your side while in a flat bed without using bedrails?: A Lot Help needed moving from lying on your back to sitting on the side of a flat bed without using bedrails?: A Lot Help needed moving to and from a bed to a chair (including a wheelchair)?: A Lot Help needed standing up from a chair using your arms (e.g., wheelchair or bedside chair)?: A Little Help needed to walk in hospital room?: Total Help needed climbing 3-5 steps with a railing? : Total 6 Click Score: 11    End of Session Equipment Utilized During Treatment: Gait belt Activity Tolerance: Patient limited by pain;Patient limited by fatigue Patient left: in bed;with call bell/phone within reach;with bed alarm set;with SCD's reapplied Nurse Communication: Other (comment) (attempts to get out of recliner without  assistance) PT Visit Diagnosis: Muscle weakness (generalized) (M62.81);Difficulty in walking, not elsewhere classified (R26.2);Pain Pain - Right/Left: Left Pain - part of body: Hip     Time: 1201-1225 PT Time Calculation (min) (ACUTE ONLY): 24 min  Charges:  $Therapeutic Exercise: 8-22 mins $Therapeutic Activity: 8-22 mins                     [name], PT, DPT 10/19/20, 12:34 PM    [name] 10/19/2020, 12:32 PM

## 2020-10-19 NOTE — Progress Notes (Signed)
   Subjective: 2 Days Post-Op Procedure(s) (LRB): INTRAMEDULLARY (IM) NAIL INTERTROCHANTRIC (Left) Patient reports pain as mild.   Patient is well, and has had no acute complaints or problems Denies any CP, SOB, ABD pain. We will continue therapy today.   Objective: Vital signs in last 24 hours: Temp:  [97.3 F (36.3 C)-98.6 F (37 C)] 98.1 F (36.7 C) (03/04 0748) Pulse Rate:  [90-101] 96 (03/04 0748) Resp:  [16-20] 16 (03/04 0748) BP: (122-165)/(80-108) 140/96 (03/04 0748) SpO2:  [94 %-97 %] 95 % (03/04 0748) Weight:  [91.6 kg] 91.6 kg (03/04 0512)  Intake/Output from previous day: 03/03 0701 - 03/04 0700 In: 240 [P.O.:240] Out: 950 [Urine:950] Intake/Output this shift: No intake/output data recorded.  Recent Labs    10/17/20 1049 10/17/20 2114 10/18/20 0609  HGB 10.2* 14.9 13.7   Recent Labs    10/17/20 2114 10/18/20 0609  WBC 13.1* 10.6*  RBC 4.78 4.46  HCT 43.1 40.1  PLT 315 287   Recent Labs    10/17/20 1049 10/17/20 2114 10/18/20 0609  NA 139  --  136  K 3.6  --  3.7  CL 109  --  105  CO2 23  --  24  BUN 18  --  14  CREATININE 1.02 1.00 0.94  GLUCOSE 147*  --  121*  CALCIUM 8.8*  --  8.0*   Recent Labs    10/17/20 1049  INR 1.1    EXAM General - Patient is Alert, Appropriate and Oriented Extremity - Neurovascular intact Sensation intact distally Intact pulses distally Dorsiflexion/Plantar flexion intact No cellulitis present Compartment soft Dressing - dressing C/D/I and no drainage Motor Function - intact, moving foot and toes well on exam.   Past Medical History:  Diagnosis Date  . GERD (gastroesophageal reflux disease)   . Hiatal hernia     Assessment/Plan:   2 Days Post-Op Procedure(s) (LRB): INTRAMEDULLARY (IM) NAIL INTERTROCHANTRIC (Left) Principal Problem:   Closed left hip fracture (HCC) Active Problems:   Left hip pain   GERD (gastroesophageal reflux disease)   Prolonged QT interval   Elevated troponin    Anemia   Fall  Estimated body mass index is 28.97 kg/m as calculated from the following:   Height as of this encounter: 5\' 10"  (1.778 m).   Weight as of this encounter: 91.6 kg. Advance diet Up with therapy, WBAT LLE VSS Labs stable Pain well controlled CM To assist with discharge to SNF  Lovenox daily x 14 days at discharge Follow up with [facility] in 2 weeks for staple removal   DVT Prophylaxis - Lovenox, TED hose and SCDs Weight-Bearing as tolerated to left leg   T. , PA-C [facility] 10/19/2020, 8:07 AM

## 2020-10-20 MED ORDER — POTASSIUM CHLORIDE CRYS ER 20 MEQ PO TBCR
40.0000 meq | EXTENDED_RELEASE_TABLET | Freq: Once | ORAL | Status: AC
  Administered 2020-10-20: 40 meq via ORAL
  Filled 2020-10-20: qty 2

## 2020-10-20 MED ORDER — BISACODYL 10 MG RE SUPP
10.0000 mg | Freq: Once | RECTAL | Status: AC
  Administered 2020-10-20: 10 mg via RECTAL
  Filled 2020-10-20: qty 1

## 2020-10-20 NOTE — Progress Notes (Signed)
   10/20/20 0230  Clinical Encounter Type  Visited With Patient  Visit Type Initial;Psychological support;Social support  Referral From Nurse  Consult/Referral To Chaplain   ?Chaplain received a spiritual consult from nurse, stating the prater requested prayer. When Chaplain arrived at PT's room, he said he never did request prayer, and does not believe in God or prayer. He also stated he did not want a Chaplain at this time

## 2020-10-20 NOTE — Progress Notes (Signed)
Physical Therapy Treatment Patient Details Name: Casey Rangel MRN: [id number] DOB: 01-22-1951 Today's Date: 10/20/2020    History of Present Illness 70 y.o. male with history significant for GERD, hiatal hernia, who is admitted to [facility] on 10/17/2020 with acute left intertrochanteric hip fracture after fall.  IM nailing 10/17/20.    PT Comments    Pt was sitting in recliner upon arriving. He agrees to PT session with some encouragement. " It hurts pretty bad." Rated pain 8/10 however was agreeable to session. Stood from recliner with min assist + moderate vcs for technique. Ambulated 20 ft without LOB however has slow antalgic step to gait pattern. Pt did not want to ambulate into hallway. Once return to bed, pt performed HEP handout with assistance. Overall tolerated session well but will benefit from SNF at DC to address deficits prior to returning home. He was in bed at conclusion of session with call bell in reach,bed alarm in place and ice pack applied.    Follow Up Recommendations  SNF     Equipment Recommendations  None recommended by PT    Recommendations for Other Services       Precautions / Restrictions Precautions Precautions: Fall Restrictions Weight Bearing Restrictions: Yes LLE Weight Bearing: Weight bearing as tolerated    Mobility  Bed Mobility Overal bed mobility: Needs Assistance Bed Mobility: Sit to Supine     Supine to sit: HOB elevated;Max assist Sit to supine: Mod assist;HOB elevated   General bed mobility comments: Pt required mod assist to progress BLEs into bed. vcs for improved technique. limited by pain    Transfers Overall transfer level: Needs assistance Equipment used: Rolling walker (2 wheeled) Transfers: Sit to/from Stand Sit to Stand: Min assist         General transfer comment: MIn assist to stand from recliner height. Vcs for tehcnique improvements and improved fwd wt  shift.  Ambulation/Gait Ambulation/Gait assistance: Min guard;Supervision Gait Distance (Feet): 20 Feet Assistive device: Rolling walker (2 wheeled) Gait Pattern/deviations: Antalgic;Step-to pattern Gait velocity: significantly decreased   General Gait Details: Relies heavily on BUE/RLE with decreased weight shift & weight bearing through LLE. Minimal RLE foot clearance when stepping forwards.      Balance Overall balance assessment: Needs assistance Sitting-balance support: Bilateral upper extremity supported Sitting balance-Leahy Scale: Good Sitting balance - Comments: no LOB in sitting once EOB short sitting   Standing balance support: Bilateral upper extremity supported Standing balance-Leahy Scale: Fair Standing balance comment: reliant on BUE support       Cognition Arousal/Alertness: Awake/alert Behavior During Therapy: WFL for tasks assessed/performed Overall Cognitive Status: No family/caregiver present to determine baseline cognitive functioning        General Comments: Pt was alert but flat. Able to follow commands and is cooperative but limited by pain      Exercises General Exercises - Lower Extremity Ankle Circles/Pumps: AROM;10 reps Quad Sets: Strengthening;10 reps Gluteal Sets: 10 reps Short Arc Quad: AROM;Strengthening;Left;10 reps;Supine Heel Slides: AAROM;Strengthening;Left;10 reps;Supine Hip ABduction/ADduction: AAROM;Strengthening;Left;10 reps;Supine Straight Leg Raises: AAROM;5 reps        Pertinent Vitals/Pain Pain Assessment: 0-10 Pain Score: 8  Faces Pain Scale: Hurts whole lot Pain Location: L hip Pain Descriptors / Indicators: Grimacing;Operative site guarding Pain Intervention(s): Limited activity within patient's tolerance;Monitored during session;Repositioned;Patient requesting pain meds-RN notified           PT Goals (current goals can now be found in the care plan section) Acute Rehab PT Goals Patient Stated Goal: rehab  then  home Progress towards PT goals: Progressing toward goals (slowed progress 2/2 to pain)    Frequency    BID      PT Plan Current plan remains appropriate       AM-PAC PT "6 Clicks" Mobility   Outcome Measure  Help needed turning from your back to your side while in a flat bed without using bedrails?: A Lot Help needed moving from lying on your back to sitting on the side of a flat bed without using bedrails?: A Lot Help needed moving to and from a bed to a chair (including a wheelchair)?: A Lot Help needed standing up from a chair using your arms (e.g., wheelchair or bedside chair)?: A Little Help needed to walk in hospital room?: A Little Help needed climbing 3-5 steps with a railing? : A Lot 6 Click Score: 14    End of Session Equipment Utilized During Treatment: Gait belt Activity Tolerance: Patient limited by pain;Patient tolerated treatment well Patient left: in bed;with call bell/phone within reach;with bed alarm set Nurse Communication: Mobility status PT Visit Diagnosis: Muscle weakness (generalized) (M62.81);Difficulty in walking, not elsewhere classified (R26.2);Pain Pain - Right/Left: Left Pain - part of body: Hip     Time: 7829-5621 PT Time Calculation (min) (ACUTE ONLY): 18 min  Charges:  $Gait Training: 8-22 mins $Therapeutic Activity: 8-22 mins                     Jetta Lout PTA 10/20/20, 2:30 PM

## 2020-10-20 NOTE — Progress Notes (Signed)
   Subjective: 3 Days Post-Op Procedure(s) (LRB): INTRAMEDULLARY (IM) NAIL INTERTROCHANTRIC (Left) Patient reports pain as mild.   Patient is well, and has had no acute complaints or problems Denies any CP, SOB, ABD pain. We will continue therapy today. Slow progress. Plan to dc to SNF at discharge  Objective: Vital signs in last 24 hours: Temp:  [97.5 F (36.4 C)-98.9 F (37.2 C)] 97.7 F (36.5 C) (03/05 0335) Pulse Rate:  [88-110] 88 (03/05 0335) Resp:  [16-20] 20 (03/05 0335) BP: (123-149)/(83-95) 123/95 (03/05 0335) SpO2:  [95 %-100 %] 96 % (03/05 0335) Weight:  [91 kg] 91 kg (03/05 0500)  Intake/Output from previous day: 03/04 0701 - 03/05 0700 In: 240 [P.O.:240] Out: 100 [Urine:100] Intake/Output this shift: Total I/O In: -  Out: 400 [Urine:400]  Recent Labs    10/17/20 1049 10/17/20 2114 10/18/20 0609 10/19/20 0728  HGB 10.2* 14.9 13.7 13.6   Recent Labs    10/18/20 0609 10/19/20 0728  WBC 10.6* 12.1*  RBC 4.46 4.35  HCT 40.1 38.5*  PLT 287 269   Recent Labs    10/18/20 0609 10/19/20 0728  NA 136 137  K 3.7 3.5  CL 105 104  CO2 24 25  BUN 14 11  CREATININE 0.94 0.80  GLUCOSE 121* 111*  CALCIUM 8.0* 8.1*   Recent Labs    10/17/20 1049  INR 1.1    EXAM General - Patient is Alert, Appropriate and Oriented Extremity - Neurovascular intact Sensation intact distally Intact pulses distally Dorsiflexion/Plantar flexion intact No cellulitis present Compartment soft Dressing - dressing C/D/I and no drainage, new dressing applied today. No active drainage Motor Function - intact, moving foot and toes well on exam.   Past Medical History:  Diagnosis Date  . GERD (gastroesophageal reflux disease)   . Hiatal hernia     Assessment/Plan:   3 Days Post-Op Procedure(s) (LRB): INTRAMEDULLARY (IM) NAIL INTERTROCHANTRIC (Left) Principal Problem:   Closed left hip fracture (HCC) Active Problems:   Left hip pain   GERD (gastroesophageal reflux  disease)   Prolonged QT interval   Elevated troponin   Anemia   Fall  Estimated body mass index is 28.8 kg/m as calculated from the following:   Height as of this encounter: 5\' 10"  (1.778 m).   Weight as of this encounter: 91 kg. Advance diet Up with therapy, WBAT LLE Work on BM, suppository ordered VSS Pain well controlled Slow progress with PT CM To assist with discharge to SNF. Patient ready for discharge.  Lovenox daily x 14 days at discharge Follow up with [facility] in 2 weeks for staple removal Keep incision site clean and dry Pain RX printed/signed and in patients chart   DVT Prophylaxis - Lovenox, TED hose and SCDs Weight-Bearing as tolerated to left leg   T. , PA-C [facility] 10/20/2020, 8:15 AM

## 2020-10-20 NOTE — Progress Notes (Signed)
Physical Therapy Treatment Patient Details Name: Casey Rangel MRN: [id number] DOB: 02/25/1951 Today's Date: 10/20/2020    History of Present Illness 70 y.o. male with history significant for GERD, hiatal hernia, who is admitted to [facility] on 10/17/2020 with acute left intertrochanteric hip fracture after fall.  IM nailing 10/17/20.    PT Comments    Pt was long sitting in bed upon arriving. He agrees to PT session with encouragement. Does endorse pain but did not give rating. He required max assist to achieve EOB short sit however once in sitting was able to stand to RW with Min assist. Ambulated in room ~ 12 ft with RW + CGA. Pt has slow, antalgic gait but no LOB. Limited distance due to pain. Pt is planning to DC to SNF. He will benefit form continued skilled PT to address deficits while assisting pt to PLOF. Pt was in recliner with chair alarm in place, call bell in reach, and RN aware of pt's abilities.    Follow Up Recommendations  SNF     Equipment Recommendations  None recommended by PT    Recommendations for Other Services       Precautions / Restrictions Precautions Precautions: Fall Restrictions Weight Bearing Restrictions: Yes    Mobility  Bed Mobility Overal bed mobility: Needs Assistance Bed Mobility: Supine to Sit     Supine to sit: HOB elevated;Max assist     General bed mobility comments: Max assist 2/2 to pain. pt was able to progress BLEs to EOB but needs extensive assistance to fully achieve  EOB short sit    Transfers Overall transfer level: Needs assistance Equipment used: Rolling walker (2 wheeled) Transfers: Sit to/from Stand Sit to Stand: Min assist         General transfer comment: Min assist to safely STS from slightly elevated bed height to RW. vcs for technique and sequencing  Ambulation/Gait Ambulation/Gait assistance: Min guard Gait Distance (Feet): 12 Feet Assistive device: Rolling walker (2 wheeled) Gait  Pattern/deviations: Antalgic;Step-to pattern Gait velocity: significantly decreased   General Gait Details: Relies heavily on BUE/RLE with decreased weight shift & weight bearing through LLE. Minimal RLE foot clearance when stepping forwards.      Balance Overall balance assessment: Needs assistance Sitting-balance support: Bilateral upper extremity supported Sitting balance-Leahy Scale: Good Sitting balance - Comments: no LOB in sitting once EOB short sitting   Standing balance support: Bilateral upper extremity supported Standing balance-Leahy Scale: Fair Standing balance comment: reliant on BUE support       Cognition Arousal/Alertness: Awake/alert Behavior During Therapy: WFL for tasks assessed/performed Overall Cognitive Status: No family/caregiver present to determine baseline cognitive functioning        General Comments: Pt was alert but flat. Able to follow commands and is cooperative but limited by pain             Pertinent Vitals/Pain Pain Assessment: 0-10 Pain Score: 7  Faces Pain Scale: Hurts even more Pain Location: L hip Pain Descriptors / Indicators: Grimacing;Operative site guarding Pain Intervention(s): Limited activity within patient's tolerance;Monitored during session;Premedicated before session;Repositioned           PT Goals (current goals can now be found in the care plan section) Acute Rehab PT Goals Patient Stated Goal: rehab then home    Frequency    BID      PT Plan Current plan remains appropriate       AM-PAC PT "6 Clicks" Mobility   Outcome Measure  Help needed  turning from your back to your side while in a flat bed without using bedrails?: A Lot Help needed moving from lying on your back to sitting on the side of a flat bed without using bedrails?: A Lot Help needed moving to and from a bed to a chair (including a wheelchair)?: A Lot Help needed standing up from a chair using your arms (e.g., wheelchair or bedside  chair)?: A Little Help needed to walk in hospital room?: A Little Help needed climbing 3-5 steps with a railing? : A Lot 6 Click Score: 14    End of Session Equipment Utilized During Treatment: Gait belt Activity Tolerance: Patient limited by pain;Patient tolerated treatment well Patient left: in chair;with call bell/phone within reach;with chair alarm set Nurse Communication: Mobility status PT Visit Diagnosis: Muscle weakness (generalized) (M62.81);Difficulty in walking, not elsewhere classified (R26.2);Pain Pain - Right/Left: Left Pain - part of body: Hip     Time: 1050-1106 PT Time Calculation (min) (ACUTE ONLY): 16 min  Charges:  $Therapeutic Activity: 8-22 mins                     Jetta Lout PTA 10/20/20, 11:17 AM

## 2020-10-21 DIAGNOSIS — F5101 Primary insomnia: Secondary | ICD-10-CM

## 2020-10-21 MED ORDER — TRAZODONE HCL 50 MG PO TABS
50.0000 mg | ORAL_TABLET | Freq: Every day | ORAL | Status: DC
  Administered 2020-10-21: 50 mg via ORAL
  Filled 2020-10-21: qty 1

## 2020-10-21 MED ORDER — MELATONIN 5 MG PO TABS
10.0000 mg | ORAL_TABLET | Freq: Every day | ORAL | Status: DC
  Administered 2020-10-21: 10 mg via ORAL
  Filled 2020-10-21: qty 2

## 2020-10-21 NOTE — Progress Notes (Signed)
   Subjective: 4 Days Post-Op Procedure(s) (LRB): INTRAMEDULLARY (IM) NAIL INTERTROCHANTRIC (Left) Patient reports pain as mild.   Patient is well, and has had no acute complaints or problems Denies any CP, SOB, ABD pain. + BM We will continue therapy today. Making progress. Plan to dc to SNF at discharge  Objective: Vital signs in last 24 hours: Temp:  [97.6 F (36.4 C)-98.9 F (37.2 C)] 97.8 F (36.6 C) (03/06 0846) Pulse Rate:  [85-96] 85 (03/06 0846) Resp:  [16-20] 16 (03/06 0846) BP: (125-149)/(80-97) 143/97 (03/06 0846) SpO2:  [94 %-97 %] 94 % (03/06 0846)  Intake/Output from previous day: 03/05 0701 - 03/06 0700 In: 0  Out: 600 [Urine:600] Intake/Output this shift: No intake/output data recorded.  Recent Labs    10/19/20 0728  HGB 13.6   Recent Labs    10/19/20 0728  WBC 12.1*  RBC 4.35  HCT 38.5*  PLT 269   Recent Labs    10/19/20 0728  NA 137  K 3.5  CL 104  CO2 25  BUN 11  CREATININE 0.80  GLUCOSE 111*  CALCIUM 8.1*   No results for input(s): LABPT, INR in the last 72 hours.  EXAM General - Patient is Alert, Appropriate and Oriented Extremity - Neurovascular intact Sensation intact distally Intact pulses distally Dorsiflexion/Plantar flexion intact No cellulitis present Compartment soft Dressing - dressing C/D/I and no drainage Motor Function - intact, moving foot and toes well on exam.   Past Medical History:  Diagnosis Date  . GERD (gastroesophageal reflux disease)   . Hiatal hernia     Assessment/Plan:   4 Days Post-Op Procedure(s) (LRB): INTRAMEDULLARY (IM) NAIL INTERTROCHANTRIC (Left) Principal Problem:   Closed left hip fracture (HCC) Active Problems:   Left hip pain   GERD (gastroesophageal reflux disease)   Prolonged QT interval   Elevated troponin   Anemia   Fall  Estimated body mass index is 28.8 kg/m as calculated from the following:   Height as of this encounter: 5\' 10"  (1.778 m).   Weight as of this  encounter: 91 kg. Advance diet Up with therapy, WBAT LLE VSS Pain well controlled CM To assist with discharge to SNF. Stable from ortho standpoint for discharge to SNF  Lovenox daily x 14 days at discharge Follow up with [facility] in 2 weeks for staple removal Keep incision site clean and dry Pain RX printed/signed and in patients chart   DVT Prophylaxis - Lovenox, TED hose and SCDs Weight-Bearing as tolerated to left leg   T. , PA-C [facility] 10/21/2020, 8:49 AM

## 2020-10-21 NOTE — Progress Notes (Signed)
PROGRESS NOTE  Casey Rangel ZOX:[id number] DOB: 1950-08-30   PCP: Patient, No Pcp Per  Patient is from: Home.  Independently ambulates at baseline.  DOA: 10/17/2020 LOS: 4  Chief complaints: Left hip pain  Brief Narrative / Interim history: 70 year old M with no significant PMH other than GERD and hiatal hernia brought to ED by EMS after he had accidental fall and left hip fracture.  He underwent intramedullary nailing by Dr. Rosita Kea on 10/18/2020.  Therapy recommended SNF.  Subjective: Seen and examined earlier this morning.  No major events overnight of this morning.  He says he had only 2 hours of sleep after Ambien last night.  Otherwise, no complaints.  Pain fairly controlled.  Denies chest pain, problems breathing, GI or UTI symptoms.  Objective: Vitals:   10/20/20 2045 10/20/20 2352 10/21/20 0545 10/21/20 0846  BP: 125/86 (!) 149/88 (!) 146/93 (!) 143/97  Pulse: 96 87 89 85  Resp: 17 17 17 16   Temp: 97.6 F (36.4 C) 98.9 F (37.2 C) 98 F (36.7 C) 97.8 F (36.6 C)  TempSrc:      SpO2: 96% 96% 96% 94%  Weight:      Height:        Intake/Output Summary (Last 24 hours) at 10/21/2020 1036 Last data filed at 10/21/2020 1005 Gross per 24 hour  Intake 240 ml  Output 200 ml  Net 40 ml   Filed Weights   10/18/20 0324 10/19/20 0512 10/20/20 0500  Weight: 74.8 kg 91.6 kg 91 kg    Examination: GENERAL: No apparent distress.  Nontoxic.  Sitting on the edge of the bed getting ready to work with therapy. HEENT: MMM.  Vision and hearing grossly intact.  NECK: Supple.  No apparent JVD.  RESP:  No IWOB.  Fair aeration bilaterally. CVS:  RRR. Heart sounds normal.  ABD/GI/GU: BS+. Abd soft, NTND.  MSK/EXT:  Moves extremities. No apparent deformity. No edema.  SKIN: Honeycomb dressing over left hip and DCI. NEURO: Awake, alert and oriented appropriately.  No apparent focal neuro deficit. PSYCH: Calm. Normal affect.  Procedures:  10/18/2020-intramedullary nailing of left  intertrochanteric fracture  Microbiology summarized: COVID-19 and influenza PCR nonreactive.  Assessment & Plan: Accidental fall at home-stepped off the curb and fell on concrete surface Acute left intertrochanteric hip fracture-s/p intramedullary nailing by Dr. Rosita Kea on 10/18/2020 Vitamin D insufficiency -Pain control per orthopedic surgery. -Ortho recs:  WBAT Lovenox daily x 14 days at discharge Follow up with [facility] in 2 weeks for staple removal Keep incision site clean and dry Pain RX printed/signed and in patients chart -Vitamin D supplementation -PT/OT-recommended SNF.  Prolonged QT-490 on initial EKG and improved to 469. -Avoid or minimize QT prolonging drugs  Elevated blood pressure: No history of HTN.  Could be due to pain.  Improved. -As needed hydralazine with parameters  Elevated troponin-likely demand ischemia.  Noted some T wave flattening on his EKG but patient without chest pain or cardiopulmonary symptoms.  No risk factors other than his age.  GERD/hiatal hernia -PPI  Insomnia -Continue Ambien 5 mg nightly -Add trazodone and melatonin  COVID-19 vaccination-refused  Body mass index is 28.8 kg/m.         DVT prophylaxis:  enoxaparin (LOVENOX) injection 40 mg Start: 10/18/20 0800 SCDs Start: 10/17/20 1836 Place TED hose Start: 10/17/20 1836 SCDs Start: 10/17/20 1341  Code Status: Full code Family Communication: Patient and/or RN. Available if any question.  Level of care: Med-Surg Status is: Inpatient  Remains inpatient appropriate because:Unsafe  d/c plan and Inpatient level of care appropriate due to severity of illness   Dispo: The patient is from: Home              Anticipated d/c is to: SNF              Patient currently is not medically stable to d/c.   Difficult to place patient No       Consultants:  Orthopedic surgery   Sch Meds:  Scheduled Meds: . COVID-19 mRNA Vac-TriS (Pfizer)  0.3 mL Intramuscular Once  . docusate  sodium  100 mg Oral BID  . enoxaparin (LOVENOX) injection  40 mg Subcutaneous Q24H  . melatonin  10 mg Oral QHS  . pantoprazole  40 mg Oral Daily  . traZODone  50 mg Oral QHS  . Vitamin D (Ergocalciferol)  50,000 Units Oral Q7 days   Continuous Infusions: . methocarbamol (ROBAXIN) IV     PRN Meds:.acetaminophen, alum & mag hydroxide-simeth, bisacodyl, fentaNYL (SUBLIMAZE) injection, hydrALAZINE, HYDROcodone-acetaminophen, HYDROcodone-acetaminophen, LORazepam, magnesium citrate, magnesium hydroxide, menthol-cetylpyridinium **OR** phenol, methocarbamol **OR** methocarbamol (ROBAXIN) IV, morphine injection, zolpidem  Antimicrobials: Anti-infectives (From admission, onward)   Start     Dose/Rate Route Frequency Ordered Stop   10/17/20 2300  ceFAZolin (ANCEF) IVPB 2g/100 mL premix        2 g 200 mL/hr over 30 Minutes Intravenous Every 6 hours 10/17/20 2104 10/18/20 1118   10/17/20 1800  ceFAZolin (ANCEF) IVPB 2g/100 mL premix        2 g 200 mL/hr over 30 Minutes Intravenous  Once 10/17/20 1250 10/17/20 1722   10/17/20 1654  ceFAZolin (ANCEF) 2-4 GM/100ML-% IVPB       Note to Pharmacy: Register, Karen   : cabinet override      10/17/20 1654 10/17/20 1742       I have personally reviewed the following labs and images: CBC: Recent Labs  Lab 10/17/20 1049 10/17/20 2114 10/18/20 0609 10/19/20 0728  WBC 6.6 13.1* 10.6* 12.1*  NEUTROABS 4.8  --   --   --   HGB 10.2* 14.9 13.7 13.6  HCT 33.7* 43.1 40.1 38.5*  MCV 79.7* 90.2 89.9 88.5  PLT 378 315 287 269   BMP &GFR Recent Labs  Lab 10/17/20 1049 10/17/20 1333 10/17/20 2114 10/18/20 0609 10/19/20 0728  NA 139  --   --  136 137  K 3.6  --   --  3.7 3.5  CL 109  --   --  105 104  CO2 23  --   --  24 25  GLUCOSE 147*  --   --  121* 111*  BUN 18  --   --  14 11  CREATININE 1.02  --  1.00 0.94 0.80  CALCIUM 8.8*  --   --  8.0* 8.1*  MG  --  2.3  --  1.9 2.0  PHOS  --   --   --   --  2.5   Estimated Creatinine Clearance:  98.9 mL/min (by C-G formula based on SCr of 0.8 mg/dL). Liver & Pancreas: Recent Labs  Lab 10/17/20 1049 10/19/20 0728  AST 19  --   ALT 13  --   ALKPHOS 54  --   BILITOT 0.9  --   PROT 7.1  --   ALBUMIN 3.8 3.2*   No results for input(s): LIPASE, AMYLASE in the last 168 hours. No results for input(s): AMMONIA in the last 168 hours. Diabetic: No results for input(s): HGBA1C in  the last 72 hours. No results for input(s): GLUCAP in the last 168 hours. Cardiac Enzymes: No results for input(s): CKTOTAL, CKMB, CKMBINDEX, TROPONINI in the last 168 hours. No results for input(s): PROBNP in the last 8760 hours. Coagulation Profile: Recent Labs  Lab 10/17/20 1049  INR 1.1   Thyroid Function Tests: No results for input(s): TSH, T4TOTAL, FREET4, T3FREE, THYROIDAB in the last 72 hours. Lipid Profile: No results for input(s): CHOL, HDL, LDLCALC, TRIG, CHOLHDL, LDLDIRECT in the last 72 hours. Anemia Panel: No results for input(s): VITAMINB12, FOLATE, FERRITIN, TIBC, IRON, RETICCTPCT in the last 72 hours. Urine analysis:    Component Value Date/Time   COLORURINE YELLOW (A) 10/17/2020 1134   APPEARANCEUR HAZY (A) 10/17/2020 1134   LABSPEC 1.023 10/17/2020 1134   PHURINE 5.0 10/17/2020 1134   GLUCOSEU NEGATIVE 10/17/2020 1134   HGBUR NEGATIVE 10/17/2020 1134   BILIRUBINUR NEGATIVE 10/17/2020 1134   KETONESUR NEGATIVE 10/17/2020 1134   PROTEINUR NEGATIVE 10/17/2020 1134   NITRITE NEGATIVE 10/17/2020 1134   LEUKOCYTESUR NEGATIVE 10/17/2020 1134   Sepsis Labs: Invalid input(s): PROCALCITONIN, LACTICIDVEN  Microbiology: Recent Results (from the past 240 hour(s))  Resp Panel by RT-PCR (Flu A&B, Covid) Nasopharyngeal Swab     Status: None   Collection Time: 10/17/20  3:39 PM   Specimen: Nasopharyngeal Swab; Nasopharyngeal(NP) swabs in vial transport medium  Result Value Ref Range Status   SARS Coronavirus 2 by RT PCR NEGATIVE NEGATIVE Final    Comment: (NOTE) SARS-CoV-2 target  nucleic acids are NOT DETECTED.  The SARS-CoV-2 RNA is generally detectable in upper respiratory specimens during the acute phase of infection. The lowest concentration of SARS-CoV-2 viral copies this assay can detect is 138 copies/mL. A negative result does not preclude SARS-Cov-2 infection and should not be used as the sole basis for treatment or other patient management decisions. A negative result may occur with  improper specimen collection/handling, submission of specimen other than nasopharyngeal swab, presence of viral mutation(s) within the areas targeted by this assay, and inadequate number of viral copies(<138 copies/mL). A negative result must be combined with clinical observations, patient history, and epidemiological information. The expected result is Negative.  Fact Sheet for Patients:  BloggerCourse.com  Fact Sheet for Healthcare Providers:  SeriousBroker.it  This test is no t yet approved or cleared by the [place] FDA and  has been authorized for detection and/or diagnosis of SARS-CoV-2 by FDA under an Emergency Use Authorization (EUA). This EUA will remain  in effect (meaning this test can be used) for the duration of the COVID-19 declaration under Section 564(b)(1) of the Act, 21 U.S.C.section 360bbb-3(b)(1), unless the authorization is terminated  or revoked sooner.       Influenza A by PCR NEGATIVE NEGATIVE Final   Influenza B by PCR NEGATIVE NEGATIVE Final    Comment: (NOTE) The Xpert Xpress SARS-CoV-2/FLU/RSV plus assay is intended as an aid in the diagnosis of influenza from Nasopharyngeal swab specimens and should not be used as a sole basis for treatment. Nasal washings and aspirates are unacceptable for Xpert Xpress SARS-CoV-2/FLU/RSV testing.  Fact Sheet for Patients: BloggerCourse.com  Fact Sheet for Healthcare  Providers: SeriousBroker.it  This test is not yet approved or cleared by the [place] FDA and has been authorized for detection and/or diagnosis of SARS-CoV-2 by FDA under an Emergency Use Authorization (EUA). This EUA will remain in effect (meaning this test can be used) for the duration of the COVID-19 declaration under Section 564(b)(1) of the Act, 21 U.S.C. section  360bbb-3(b)(1), unless the authorization is terminated or revoked.  Performed at [facility], [address], [place], [place] [address]     Radiology Studies: No results found.    Taye T. Gonfa [facility]  If 7PM-7AM, please contact night-coverage [email or web address] 10/21/2020, 10:36 AM

## 2020-10-21 NOTE — Progress Notes (Signed)
Physical Therapy Treatment Patient Details Name: Casey Rangel MRN: [id number] DOB: [birth year] Today's Date: 10/21/2020    History of Present Illness 70 y.o. male with history significant for GERD, hiatal hernia, who is admitted to [facility] on 10/17/2020 with acute left intertrochanteric hip fracture after fall.  IM nailing 10/17/20.    PT Comments    Pt seen for PT tx with noted improvement in ability to complete supine>sit with only min assist on this date. Pt is able to ambulate increased distances, but still short household distances, with RW & CGA with step-to pattern. Pt continues to deny pain but has behaviors demonstrating significant pain in LLE with movement. Pt would benefit from STR upon d/c to maximize independence with functional mobility & reduce fall risk prior to return home.    Follow Up Recommendations  SNF     Equipment Recommendations  None recommended by PT    Recommendations for Other Services       Precautions / Restrictions Precautions Precautions: Fall Restrictions Weight Bearing Restrictions: Yes LLE Weight Bearing: Weight bearing as tolerated    Mobility  Bed Mobility Overal bed mobility: Needs Assistance Bed Mobility: Supine to Sit     Supine to sit: Min assist;HOB elevated (assistance to move LLE to EOB)          Transfers Overall transfer level: Needs assistance Equipment used: Rolling walker (2 wheeled) Transfers: Sit to/from Stand Sit to Stand: Min assist         General transfer comment: cuing for safe hand placement to push to standing  Ambulation/Gait Ambulation/Gait assistance: Min guard Gait Distance (Feet): 32 Feet Assistive device: Rolling walker (2 wheeled) Gait Pattern/deviations: Decreased step length - left;Decreased step length - right;Step-to pattern;Decreased stride length;Decreased dorsiflexion - left;Decreased stance time - left;Decreased weight shift to left Gait velocity: significantly  decreased   General Gait Details: Improved ability to weight bear through LLE compared to last time this PT saw him but still with decreased ability to weight bear to allow step through RLE.   Stairs             Wheelchair Mobility    Modified Rankin (Stroke Patients Only)       Balance Overall balance assessment: Needs assistance Sitting-balance support: Bilateral upper extremity supported Sitting balance-Leahy Scale: Good Sitting balance - Comments: no LOB in sitting once EOB short sitting   Standing balance support: Bilateral upper extremity supported Standing balance-Leahy Scale: Poor Standing balance comment: BUE suport on RW during standing                            Cognition   Behavior During Therapy: WFL for tasks assessed/performed;Flat affect Overall Cognitive Status: No family/caregiver present to determine baseline cognitive functioning                                 General Comments: appears HOH with decreased awareness      Exercises General Exercises - Lower Extremity Long Arc Quad: AROM;Strengthening;Left;10 reps;Seated (x 2 sets)    General Comments        Pertinent Vitals/Pain Pain Assessment: Faces Faces Pain Scale: Hurts whole lot Pain Location: L hip Pain Descriptors / Indicators: Grimacing;Operative site guarding Pain Intervention(s): Monitored during session;Limited activity within patient's tolerance (notified MD)    Home Living  Prior Function            PT Goals (current goals can now be found in the care plan section) Acute Rehab PT Goals Patient Stated Goal: rehab then home PT Goal Formulation: With patient Time For Goal Achievement: 11/01/20 Potential to Achieve Goals: Fair Progress towards PT goals: Progressing toward goals    Frequency    BID      PT Plan Current plan remains appropriate    Co-evaluation              AM-PAC PT "6 Clicks"  Mobility   Outcome Measure  Help needed turning from your back to your side while in a flat bed without using bedrails?: A Lot Help needed moving from lying on your back to sitting on the side of a flat bed without using bedrails?: A Lot Help needed moving to and from a bed to a chair (including a wheelchair)?: A Lot Help needed standing up from a chair using your arms (e.g., wheelchair or bedside chair)?: A Little Help needed to walk in hospital room?: A Little Help needed climbing 3-5 steps with a railing? : A Lot 6 Click Score: 14    End of Session Equipment Utilized During Treatment: Gait belt Activity Tolerance: Patient limited by pain;Patient tolerated treatment well Patient left: in chair;with call bell/phone within reach;with chair alarm set;with family/visitor present;with SCD's reapplied Nurse Communication: Mobility status PT Visit Diagnosis: Muscle weakness (generalized) (M62.81);Difficulty in walking, not elsewhere classified (R26.2);Pain Pain - Right/Left: Left Pain - part of body: Hip     Time: 1308-6578 PT Time Calculation (min) (ACUTE ONLY): 24 min  Charges:  $Therapeutic Activity: 23-37 mins                     [name], PT, DPT 10/21/20, 10:01 AM    [name] 10/21/2020, 9:59 AM

## 2020-10-22 DIAGNOSIS — E559 Vitamin D deficiency, unspecified: Secondary | ICD-10-CM

## 2020-10-22 LAB — METHYLMALONIC ACID, SERUM: Methylmalonic Acid, Quantitative: 160 nmol/L (ref 0–378)

## 2020-10-22 LAB — RESP PANEL BY RT-PCR (FLU A&B, COVID) ARPGX2
Influenza A by PCR: NEGATIVE
Influenza B by PCR: NEGATIVE
SARS Coronavirus 2 by RT PCR: NEGATIVE

## 2020-10-22 MED ORDER — TRAZODONE HCL 50 MG PO TABS: 50.0000 mg | ORAL_TABLET | Freq: Every day | ORAL | Status: DC

## 2020-10-22 MED ORDER — SENNOSIDES-DOCUSATE SODIUM 8.6-50 MG PO TABS: 1.0000 | ORAL_TABLET | Freq: Two times a day (BID) | ORAL | 0 refills | Status: DC | PRN

## 2020-10-22 MED ORDER — VITAMIN D (ERGOCALCIFEROL) 1.25 MG (50000 UNIT) PO CAPS: 50000.0000 [IU] | ORAL_CAPSULE | ORAL | 0 refills | Status: DC

## 2020-10-22 MED ORDER — COVID-19 MRNA VAC-TRIS(PFIZER) 30 MCG/0.3ML IM SUSP
0.3000 mL | Freq: Once | INTRAMUSCULAR | Status: DC
  Filled 2020-10-22: qty 0.3

## 2020-10-22 NOTE — Progress Notes (Signed)
   Subjective: 5 Days Post-Op Procedure(s) (LRB): INTRAMEDULLARY (IM) NAIL INTERTROCHANTRIC (Left) Patient reports mild soreness left hip.   Patient is well, and has had no acute complaints or problems Denies any CP, SOB, ABD pain. + BM We will continue therapy today. Making progress. Plan to dc to SNF at discharge  Objective: Vital signs in last 24 hours: Temp:  [97.3 F (36.3 C)-98.6 F (37 C)] 98.6 F (37 C) (03/07 0805) Pulse Rate:  [79-93] 85 (03/07 0805) Resp:  [16-20] 17 (03/07 0805) BP: (121-152)/(74-97) 133/86 (03/07 0805) SpO2:  [93 %-96 %] 95 % (03/07 0805)  Intake/Output from previous day: 03/06 0701 - 03/07 0700 In: 240 [P.O.:240] Out: 950 [Urine:950] Intake/Output this shift: No intake/output data recorded.  No results for input(s): HGB in the last 72 hours. No results for input(s): WBC, RBC, HCT, PLT in the last 72 hours. No results for input(s): NA, K, CL, CO2, BUN, CREATININE, GLUCOSE, CALCIUM in the last 72 hours. No results for input(s): LABPT, INR in the last 72 hours.  EXAM General - Patient is Alert, Appropriate and Oriented Extremity - Neurovascular intact Sensation intact distally Intact pulses distally Dorsiflexion/Plantar flexion intact No cellulitis present Compartment soft Dressing - dressing C/D/I and no drainage Motor Function - intact, moving foot and toes well on exam.   Past Medical History:  Diagnosis Date  . GERD (gastroesophageal reflux disease)   . Hiatal hernia     Assessment/Plan:   5 Days Post-Op Procedure(s) (LRB): INTRAMEDULLARY (IM) NAIL INTERTROCHANTRIC (Left) Principal Problem:   Closed left hip fracture (HCC) Active Problems:   Left hip pain   GERD (gastroesophageal reflux disease)   Prolonged QT interval   Elevated troponin   Anemia   Fall  Estimated body mass index is 28.8 kg/m as calculated from the following:   Height as of this encounter: 5\' 10"  (1.778 m).   Weight as of this encounter: 91 kg. Advance  diet Up with therapy, WBAT LLE VSS Pain well controlled CM To assist with discharge to SNF. Stable from ortho standpoint for discharge to SNF  Lovenox daily x 14 days at discharge Follow up with [facility] in 2 weeks for staple removal Keep incision site clean and dry Pain RX printed/signed and in patients chart   DVT Prophylaxis - Lovenox, TED hose and SCDs Weight-Bearing as tolerated to left leg   T. , PA-C [facility] 10/22/2020, 8:11 AM

## 2020-10-22 NOTE — TOC Transition Note (Signed)
Transition of Care [facility]) - CM/SW Discharge Note   Patient Details  Name: Casey Rangel MRN: [id number] Date of Birth: [birth year]  [facility]) CM/SW Contact:  Liliana Cline, LCSW Phone Number: 10/22/2020, 11:53 AM   Clinical Narrative:   Patient to discharge to [facility] today, Room 16A. Confirmed with Kenney Houseman at [employer]. CSW updated MD, RN, patient's sister. Asked RN to call report and MD to submit DC Summary. Medical Necessity Form and Face Sheet placed in Discharge Packet by patient chart. EMS transport arranged for 3:30 with [facility] (next available time). No other needs identified prior to discharge.     Final next level of care: Skilled Nursing Facility Barriers to Discharge: Barriers Resolved   Patient Goals and CMS Choice Patient states their goals for this hospitalization and ongoing recovery are:: SNF rehab CMS Medicare.gov Compare Post Acute Care list provided to:: Patient Choice offered to / list presented to : Patient,Sibling  Discharge Placement              Patient chooses bed at: [facility] Patient to be transferred to facility by: EMS Name of family member notified: [name]- sister Patient and family notified of of transfer: 10/22/20  Discharge Plan and Services                DME Arranged: Dan Humphreys rolling DME Agency: [employer] Date DME Agency Contacted: 10/18/20   Representative spoke with at DME Agency: Santina Evans HH Arranged: PT,OT HH Agency: [facility] Date HH Agency Contacted: 10/18/20   Representative spoke with at [facility] Agency: Feliberto Gottron  Social Determinants of Health (SDOH) Interventions     Readmission Risk Interventions No flowsheet data found.

## 2020-10-22 NOTE — Progress Notes (Signed)
Physical Therapy Treatment Patient Details Name: Casey Rangel Eye MRN: [id number] DOB: 12-26-1950 Today's Date: 10/22/2020    History of Present Illness 70 y.o. male with history significant for GERD, hiatal hernia, who is admitted to [facility] on 10/17/2020 with acute left intertrochanteric hip fracture after fall.  IM nailing 10/17/20.    PT Comments    Pt seen for PT evaluation with pt still requiring min assist for bed mobility but is able to increase ambulation distances to 50 ft on this date. Pt also instructed in LLE strengthening exercises with MAX cuing for standing hip flexion with significantly weak muscles also impaired by pain. Will continue to follow pt acutely to progress ambulation, balance, activity tolerance, strengthening & independence with bed mobility.     Follow Up Recommendations  SNF     Equipment Recommendations  None recommended by PT    Recommendations for Other Services       Precautions / Restrictions Precautions Precautions: Fall Restrictions Weight Bearing Restrictions: Yes LLE Weight Bearing: Weight bearing as tolerated    Mobility  Bed Mobility Overal bed mobility: Needs Assistance Bed Mobility: Supine to Sit;Sit to Supine     Supine to sit: Min assist;HOB elevated Sit to supine: HOB elevated;Min assist   General bed mobility comments: assistance with moving LLE to EOB then elevating LLE onto bed, extra time & cuing, use of bed rails    Transfers Overall transfer level: Needs assistance Equipment used: Rolling walker (2 wheeled) Transfers: Sit to/from Stand Sit to Stand: Min assist         General transfer comment: cuing for safe hand placement to push to standing  Ambulation/Gait Ambulation/Gait assistance: Min guard Gait Distance (Feet): 50 Feet Assistive device: Rolling walker (2 wheeled) Gait Pattern/deviations: Decreased step length - left;Decreased step length - right;Step-to pattern;Decreased stride  length;Decreased dorsiflexion - left;Decreased stance time - left;Decreased weight shift to left Gait velocity: decreased       Stairs             Wheelchair Mobility    Modified Rankin (Stroke Patients Only)       Balance Overall balance assessment: Needs assistance Sitting-balance support: Bilateral upper extremity supported Sitting balance-Leahy Scale: Good Sitting balance - Comments: no LOB in sitting once EOB short sitting   Standing balance support: No upper extremity supported Standing balance-Leahy Scale: Fair Standing balance comment: able to maintain static standing without UE support while opening door with 1UE then adjusting mask with other                            Cognition Arousal/Alertness: Awake/alert Behavior During Therapy: WFL for tasks assessed/performed;Flat affect Overall Cognitive Status: No family/caregiver present to determine baseline cognitive functioning                                 General Comments: HOH, decreased safety awareness      Exercises General Exercises - Lower Extremity Hip Flexion/Marching: AROM;Strengthening;Left;10 reps;Standing (UE support on RW)    General Comments        Pertinent Vitals/Pain Pain Assessment: Faces Faces Pain Scale: Hurts whole lot Pain Location: L hip Pain Descriptors / Indicators: Grimacing;Operative site guarding Pain Intervention(s): Limited activity within patient's tolerance;Monitored during session    Home Living  Prior Function            PT Goals (current goals can now be found in the care plan section) Acute Rehab PT Goals Patient Stated Goal: rehab then home PT Goal Formulation: With patient Time For Goal Achievement: 11/01/20 Potential to Achieve Goals: Fair Progress towards PT goals: Progressing toward goals    Frequency    BID      PT Plan Current plan remains appropriate    Co-evaluation               AM-PAC PT "6 Clicks" Mobility   Outcome Measure  Help needed turning from your back to your side while in a flat bed without using bedrails?: A Little Help needed moving from lying on your back to sitting on the side of a flat bed without using bedrails?: A Lot Help needed moving to and from a bed to a chair (including a wheelchair)?: A Little Help needed standing up from a chair using your arms (e.g., wheelchair or bedside chair)?: A Little Help needed to walk in hospital room?: A Little Help needed climbing 3-5 steps with a railing? : A Lot 6 Click Score: 16    End of Session Equipment Utilized During Treatment: Gait belt Activity Tolerance: Patient limited by pain;Patient tolerated treatment well Patient left: in bed;with bed alarm set;with call bell/phone within reach;with SCD's reapplied   PT Visit Diagnosis: Muscle weakness (generalized) (M62.81);Difficulty in walking, not elsewhere classified (R26.2);Pain Pain - Right/Left: Left Pain - part of body: Hip     Time: 1138-1202 PT Time Calculation (min) (ACUTE ONLY): 24 min  Charges:  $Therapeutic Exercise: 8-22 mins $Therapeutic Activity: 23-37 mins                     [name], PT, DPT 10/22/20, 1:37 PM    [name] 10/22/2020, 1:36 PM

## 2020-10-22 NOTE — Care Management Important Message (Signed)
Important Message  Patient Details  Name: Casey Rangel MRN: [id number] Date of Birth: 1951-01-17   Medicare Important Message Given:  Yes     Olegario Messier A Keundra Petrucelli 10/22/2020, 1:20 PM

## 2020-10-22 NOTE — Discharge Summary (Addendum)
Physician Discharge Summary  Casey Rangel WUJ:[id number] DOB: 27-Nov-1950 DOA: 10/17/2020  PCP: Patient, No Pcp Per  Admit date: 10/17/2020 Discharge date: 10/22/2020  Admitted From: Home Disposition: SNF  Recommendations for Outpatient Follow-up:  1. Follow ups as below. 2. Please obtain CBC/BMP/Mag at follow up 3. Please follow up on the following pending results: None   Discharge Condition: Stable CODE STATUS: Full code   Contact information for follow-up providers    Evon Slack, PA-C Follow up in 2 week(s).   Specialties: Orthopedic Surgery, Emergency Medicine Contact information: [address] [place] [address] [phone]            Contact information for after-discharge care    Destination    [facility] Preferred SNF .   Service: Skilled Nursing Contact information: [address] [place] [address] [phone]                   Hospital Course: 70 year old M with no significant PMH other than GERD and hiatal hernia brought to ED by EMS after he had accidental fall and left hip fracture.  He underwent intramedullary nailing by Dr. Rosita Kea on 10/18/2020.  Therapy recommended SNF.   See individual pulmonary as below for more on hospital course.  Discharge Diagnoses:  Accidental fall at home-stepped off the curb and fell on concrete surface Acute left intertrochanteric hip fracture-s/p intramedullary nailing by Dr. Rosita Kea on 10/18/2020 Vitamin D insufficiency -Pain control per orthopedic surgery. -Ortho recs:             WBAT Lovenox daily x 14 days at discharge Follow up with [facility] in 2 weeks for staple removal Keep incision site clean and dry Pain RX printed/signed and in patients chart -Vitamin D 50,000 international unit weekly for 7 weeks -PT/OT-recommended SNF.  Prolonged QT-490 on initial EKG and improved to 469. -Avoid or minimize QT prolonging drugs  Elevated blood pressure: No history of  HTN.  Normotensive.  Could be from pain. -Pain control as above  Elevated troponin-likely demand ischemia.  Noted some T wave flattening on his EKG but patient without chest pain or cardiopulmonary symptoms.  No risk factors other than his age.  GERD/hiatal hernia -Continue home Prilosec  Insomnia -Sleep hygiene -Continue trazodone 50 mg nightly  COVID-19 vaccination-refused twice.  Overweight Body mass index is 28.8 kg/m.  -Encourage lifestyle change to lose weight.          Discharge Exam: Vitals:   10/22/20 0553 10/22/20 0805  BP: 122/84 133/86  Pulse: 79 85  Resp: 17 17  Temp: (!) 97.5 F (36.4 C) 98.6 F (37 C)  SpO2: 95% 95%    GENERAL: No apparent distress.  Nontoxic. HEENT: MMM.  Vision and hearing grossly intact.  NECK: Supple.  No apparent JVD.  RESP: On room air.  No IWOB.  Fair aeration bilaterally. CVS:  RRR. Heart sounds normal.  ABD/GI/GU: Bowel sounds present. Soft. Non tender.  MSK/EXT:  Moves extremities. No apparent deformity. No edema.  SKIN: Dressing over left hip DCI. NEURO: Awake, alert and oriented appropriately.  No apparent focal neuro deficit. PSYCH: Calm. Normal affect.  Discharge Instructions  Discharge Instructions    Diet general   Complete by: As directed    Discharge wound care:   Complete by: As directed    Keep incision site clean and dry   Increase activity slowly   Complete by: As directed    Keep incision site clean and dry  Allergies as of 10/22/2020      Reactions   Penicillins       Medication List    TAKE these medications   docusate sodium 100 MG capsule Commonly known as: COLACE Take 1 capsule (100 mg total) by mouth 2 (two) times daily.   enoxaparin 40 MG/0.4ML injection Commonly known as: LOVENOX Inject 0.4 mLs (40 mg total) into the skin daily for 14 days.   HYDROcodone-acetaminophen 5-325 MG tablet Commonly known as: NORCO/VICODIN Take 1 tablet by mouth every 4 (four) hours as needed for  moderate pain (pain score 4-6).   omeprazole 20 MG tablet Commonly known as: PRILOSEC OTC Take 20 mg by mouth daily.   senna-docusate 8.6-50 MG tablet Commonly known as: Senokot-S Take 1 tablet by mouth 2 (two) times daily between meals as needed for mild constipation.   traZODone 50 MG tablet Commonly known as: DESYREL Take 1 tablet (50 mg total) by mouth at bedtime.   Vitamin D (Ergocalciferol) 1.25 MG (50000 UNIT) Caps capsule Commonly known as: DRISDOL Take 1 capsule (50,000 Units total) by mouth every 7 (seven) days. Start taking on: October 26, 2020            Durable Medical Equipment  (From admission, onward)         Start     Ordered   10/18/20 1500  For home use only DME Walker rolling  Once       Question Answer Comment  Walker: With 5 Inch Wheels   Patient needs a walker to treat with the following condition Unsteady gait      10/18/20 1500           Discharge Care Instructions  (From admission, onward)         Start     Ordered   10/22/20 0000  Discharge wound care:       Comments: Keep incision site clean and dry   10/22/20 1136          Consultations:  Orthopedic surgery  Procedures/Studies:  10/18/2020-intramedullary nailing of left intertrochanteric fracture   DG Chest 1 View  Result Date: 10/17/2020 CLINICAL DATA:  Fall.  Left hip pain EXAM: CHEST  1 VIEW COMPARISON:  None. FINDINGS: The heart size and mediastinal contours are within normal limits. Both lungs are clear. The visualized skeletal structures are unremarkable. IMPRESSION: No active disease. Electronically Signed   By: Marlan Palau M.D.   On: 10/17/2020 11:04   DG C-Arm 1-60 Min  Result Date: 10/17/2020 CLINICAL DATA:  LEFT IM nail EXAM: LEFT FEMUR 2 VIEWS; DG C-ARM 1-60 MIN COMPARISON:  None. FINDINGS: Intraoperative fluoroscopic spot images of the LEFT femur are provided. IM nail appears appropriately positioned. Fluoroscopy provided for 45 seconds. IMPRESSION:  Intraoperative fluoroscopic spot images demonstrating placement of an intramedullary nail. No evidence of surgical complicating feature. Electronically Signed   By: Bary Richard M.D.   On: 10/17/2020 18:47   DG Hip Unilat With Pelvis 2-3 Views Left  Result Date: 10/17/2020 CLINICAL DATA:  Fall, left hip pain EXAM: DG HIP (WITH OR WITHOUT PELVIS) 2-3V LEFT COMPARISON:  None. FINDINGS: There is a left femoral intertrochanteric fracture with varus angulation. No subluxation or dislocation. Mild degenerative changes in the hip joints bilaterally. IMPRESSION: Mildly angulated left femoral intertrochanteric fracture. Electronically Signed   By: Charlett Nose M.D.   On: 10/17/2020 11:03   DG FEMUR MIN 2 VIEWS LEFT  Result Date: 10/17/2020 CLINICAL DATA:  LEFT IM nail EXAM: LEFT  FEMUR 2 VIEWS; DG C-ARM 1-60 MIN COMPARISON:  None. FINDINGS: Intraoperative fluoroscopic spot images of the LEFT femur are provided. IM nail appears appropriately positioned. Fluoroscopy provided for 45 seconds. IMPRESSION: Intraoperative fluoroscopic spot images demonstrating placement of an intramedullary nail. No evidence of surgical complicating feature. Electronically Signed   By: Bary Richard M.D.   On: 10/17/2020 18:47        The results of significant diagnostics from this hospitalization (including imaging, microbiology, ancillary and laboratory) are listed below for reference.     Microbiology: Recent Results (from the past 240 hour(s))  Resp Panel by RT-PCR (Flu A&B, Covid) Nasopharyngeal Swab     Status: None   Collection Time: 10/17/20  3:39 PM   Specimen: Nasopharyngeal Swab; Nasopharyngeal(NP) swabs in vial transport medium  Result Value Ref Range Status   SARS Coronavirus 2 by RT PCR NEGATIVE NEGATIVE Final    Comment: (NOTE) SARS-CoV-2 target nucleic acids are NOT DETECTED.  The SARS-CoV-2 RNA is generally detectable in upper respiratory specimens during the acute phase of infection. The  lowest concentration of SARS-CoV-2 viral copies this assay can detect is 138 copies/mL. A negative result does not preclude SARS-Cov-2 infection and should not be used as the sole basis for treatment or other patient management decisions. A negative result may occur with  improper specimen collection/handling, submission of specimen other than nasopharyngeal swab, presence of viral mutation(s) within the areas targeted by this assay, and inadequate number of viral copies(<138 copies/mL). A negative result must be combined with clinical observations, patient history, and epidemiological information. The expected result is Negative.  Fact Sheet for Patients:  BloggerCourse.com  Fact Sheet for Healthcare Providers:  SeriousBroker.it  This test is no t yet approved or cleared by the [place] FDA and  has been authorized for detection and/or diagnosis of SARS-CoV-2 by FDA under an Emergency Use Authorization (EUA). This EUA will remain  in effect (meaning this test can be used) for the duration of the COVID-19 declaration under Section 564(b)(1) of the Act, 21 U.S.C.section 360bbb-3(b)(1), unless the authorization is terminated  or revoked sooner.       Influenza A by PCR NEGATIVE NEGATIVE Final   Influenza B by PCR NEGATIVE NEGATIVE Final    Comment: (NOTE) The Xpert Xpress SARS-CoV-2/FLU/RSV plus assay is intended as an aid in the diagnosis of influenza from Nasopharyngeal swab specimens and should not be used as a sole basis for treatment. Nasal washings and aspirates are unacceptable for Xpert Xpress SARS-CoV-2/FLU/RSV testing.  Fact Sheet for Patients: BloggerCourse.com  Fact Sheet for Healthcare Providers: SeriousBroker.it  This test is not yet approved or cleared by the [place] FDA and has been authorized for detection and/or diagnosis of SARS-CoV-2 by FDA under  an Emergency Use Authorization (EUA). This EUA will remain in effect (meaning this test can be used) for the duration of the COVID-19 declaration under Section 564(b)(1) of the Act, 21 U.S.C. section 360bbb-3(b)(1), unless the authorization is terminated or revoked.  Performed at [facility], [address] [place], [place] [address]      Labs:  CBC: Recent Labs  Lab 10/17/20 1049 10/17/20 2114 10/18/20 0609 10/19/20 0728  WBC 6.6 13.1* 10.6* 12.1*  NEUTROABS 4.8  --   --   --   HGB 10.2* 14.9 13.7 13.6  HCT 33.7* 43.1 40.1 38.5*  MCV 79.7* 90.2 89.9 88.5  PLT 378 315 287 269   BMP &GFR Recent Labs  Lab 10/17/20 1049 10/17/20 1333 10/17/20  2114 10/18/20 0609 10/19/20 0728  NA 139  --   --  136 137  K 3.6  --   --  3.7 3.5  CL 109  --   --  105 104  CO2 23  --   --  24 25  GLUCOSE 147*  --   --  121* 111*  BUN 18  --   --  14 11  CREATININE 1.02  --  1.00 0.94 0.80  CALCIUM 8.8*  --   --  8.0* 8.1*  MG  --  2.3  --  1.9 2.0  PHOS  --   --   --   --  2.5   Estimated Creatinine Clearance: 98.9 mL/min (by C-G formula based on SCr of 0.8 mg/dL). Liver & Pancreas: Recent Labs  Lab 10/17/20 1049 10/19/20 0728  AST 19  --   ALT 13  --   ALKPHOS 54  --   BILITOT 0.9  --   PROT 7.1  --   ALBUMIN 3.8 3.2*   No results for input(s): LIPASE, AMYLASE in the last 168 hours. No results for input(s): AMMONIA in the last 168 hours. Diabetic: No results for input(s): HGBA1C in the last 72 hours. No results for input(s): GLUCAP in the last 168 hours. Cardiac Enzymes: No results for input(s): CKTOTAL, CKMB, CKMBINDEX, TROPONINI in the last 168 hours. No results for input(s): PROBNP in the last 8760 hours. Coagulation Profile: Recent Labs  Lab 10/17/20 1049  INR 1.1   Thyroid Function Tests: No results for input(s): TSH, T4TOTAL, FREET4, T3FREE, THYROIDAB in the last 72 hours. Lipid Profile: No results for input(s): CHOL, HDL, LDLCALC, TRIG, CHOLHDL,  LDLDIRECT in the last 72 hours. Anemia Panel: No results for input(s): VITAMINB12, FOLATE, FERRITIN, TIBC, IRON, RETICCTPCT in the last 72 hours. Urine analysis:    Component Value Date/Time   COLORURINE YELLOW (A) 10/17/2020 1134   APPEARANCEUR HAZY (A) 10/17/2020 1134   LABSPEC 1.023 10/17/2020 1134   PHURINE 5.0 10/17/2020 1134   GLUCOSEU NEGATIVE 10/17/2020 1134   HGBUR NEGATIVE 10/17/2020 1134   BILIRUBINUR NEGATIVE 10/17/2020 1134   KETONESUR NEGATIVE 10/17/2020 1134   PROTEINUR NEGATIVE 10/17/2020 1134   NITRITE NEGATIVE 10/17/2020 1134   LEUKOCYTESUR NEGATIVE 10/17/2020 1134   Sepsis Labs: Invalid input(s): PROCALCITONIN, LACTICIDVEN   Time coordinating discharge: 35 minutes  SIGNED:  Almon Hercules, MD  [facility] 10/22/2020, 11:36 AM  If 7PM-7AM, please contact night-coverage [email or web address]

## 2020-10-22 NOTE — Progress Notes (Signed)
PT has no IV, report called at 1355. Pt assisted with getting dressed and collecting belongings. Dressings all c,d,i. Pt being transported by ems.

## 2020-10-22 NOTE — Progress Notes (Addendum)
Physical Therapy Treatment Patient Details Name: Casey Rangel MRN: [id number] DOB: [birth year] Today's Date: 10/22/2020    History of Present Illness 70 y.o. male with history significant for GERD, hiatal hernia, who is admitted to [facility] on 10/17/2020 with acute left intertrochanteric hip fracture after fall.  IM nailing 10/17/20.    PT Comments    Pt seen for PT treatment with pt reporting significant LLE pain when assisted with supine>sit but states "it's just sore". Pt is only able to ambulate 10 ft with RW & min assist but performs LLE strengthening exercises with instructional cuing for technique. PT also educates pt on use of incentive spirometer but poor return demo by pt & pt would benefit from ongoing education. Continue to recommend STR upon d/c to maximize independence with functional mobility & reduce fall risk prior to return home.    Follow Up Recommendations  SNF     Equipment Recommendations  None recommended by PT    Recommendations for Other Services       Precautions / Restrictions Precautions Precautions: Fall Restrictions Weight Bearing Restrictions: Yes LLE Weight Bearing: Weight bearing as tolerated    Mobility  Bed Mobility Overal bed mobility: Needs Assistance Bed Mobility: Supine to Sit     Supine to sit: Min assist;HOB elevated     General bed mobility comments: assistance with transferring LLE EOB & pt with c/o increased, significant pain in L hip with movement but pt notes "it's just sore", used of bed rails & extra time to complete overall movement    Transfers Overall transfer level: Needs assistance Equipment used: Rolling walker (2 wheeled) Transfers: Sit to/from Stand Sit to Stand: Min assist         General transfer comment: cuing for safe hand placement to push to standing  Ambulation/Gait Ambulation/Gait assistance: Min guard Gait Distance (Feet): 10 Feet Assistive device: Rolling walker (2  wheeled) Gait Pattern/deviations: Decreased step length - left;Decreased step length - right;Step-to pattern;Decreased stride length;Decreased dorsiflexion - left;Decreased stance time - left;Decreased weight shift to left Gait velocity: decreased       Stairs             Wheelchair Mobility    Modified Rankin (Stroke Patients Only)       Balance Overall balance assessment: Needs assistance Sitting-balance support: Bilateral upper extremity supported Sitting balance-Leahy Scale: Good Sitting balance - Comments: no LOB in sitting once EOB short sitting   Standing balance support: No upper extremity supported Standing balance-Leahy Scale: Fair                             Cognition Arousal/Alertness: Awake/alert Behavior During Therapy: WFL for tasks assessed/performed;Flat affect Overall Cognitive Status: No family/caregiver present to determine baseline cognitive functioning                                 General Comments: HOH, decreased safety awareness      Exercises General Exercises - Lower Extremity Ankle Circles/Pumps: AROM;Left;10 reps Short Arc Quad: AROM;Strengthening;Left;10 reps (long sitting in recliner) Long Arc Quad: AROM;Strengthening;Left;10 reps;Seated Heel Slides: AAROM;Strengthening;Left;10 reps (long sitting in recliner) Hip ABduction/ADduction: AAROM;Strengthening;Left;10 reps (long sitting in recliner, hip abduction slides)    General Comments        Pertinent Vitals/Pain Pain Assessment: Faces Faces Pain Scale: Hurts even more Pain Location: L hip Pain Descriptors / Indicators: Grimacing;Operative  site guarding Pain Intervention(s): Monitored during session;Limited activity within patient's tolerance    Home Living                      Prior Function            PT Goals (current goals can now be found in the care plan section) Acute Rehab PT Goals Patient Stated Goal: rehab then home PT Goal  Formulation: With patient Time For Goal Achievement: 11/01/20 Potential to Achieve Goals: Fair Progress towards PT goals: Progressing toward goals    Frequency    BID      PT Plan Current plan remains appropriate    Co-evaluation              AM-PAC PT "6 Clicks" Mobility   Outcome Measure  Help needed turning from your back to your side while in a flat bed without using bedrails?: A Little Help needed moving from lying on your back to sitting on the side of a flat bed without using bedrails?: A Lot Help needed moving to and from a bed to a chair (including a wheelchair)?: A Little Help needed standing up from a chair using your arms (e.g., wheelchair or bedside chair)?: A Little Help needed to walk in hospital room?: A Little Help needed climbing 3-5 steps with a railing? : A Lot 6 Click Score: 16    End of Session Equipment Utilized During Treatment: Gait belt Activity Tolerance: Patient limited by pain;Patient tolerated treatment well Patient left: in chair;with call bell/phone within reach;with chair alarm set;with SCD's reapplied   PT Visit Diagnosis: Muscle weakness (generalized) (M62.81);Difficulty in walking, not elsewhere classified (R26.2);Pain Pain - Right/Left: Left Pain - part of body: Hip     Time: 2130-8657 PT Time Calculation (min) (ACUTE ONLY): 23 min  Charges:  $Therapeutic Exercise: 8-22 mins $Therapeutic Activity: 8-22 mins                     Casey Rangel, PT, DPT 10/22/20, 1:34 PM    Casey Rangel 10/22/2020, 1:30 PM

## 2020-11-14 ENCOUNTER — Encounter: Payer: Self-pay | Admitting: Internal Medicine

## 2020-11-14 ENCOUNTER — Ambulatory Visit (INDEPENDENT_AMBULATORY_CARE_PROVIDER_SITE_OTHER): Payer: Self-pay | Admitting: Internal Medicine

## 2020-11-14 ENCOUNTER — Other Ambulatory Visit: Payer: Self-pay

## 2020-11-14 VITALS — BP 140/80 | HR 85 | Ht 70.0 in | Wt 192.5 lb

## 2020-11-14 DIAGNOSIS — S72002A Fracture of unspecified part of neck of left femur, initial encounter for closed fracture: Secondary | ICD-10-CM

## 2020-11-14 DIAGNOSIS — I10 Essential (primary) hypertension: Secondary | ICD-10-CM | POA: Insufficient documentation

## 2020-11-14 DIAGNOSIS — D649 Anemia, unspecified: Secondary | ICD-10-CM

## 2020-11-14 DIAGNOSIS — K219 Gastro-esophageal reflux disease without esophagitis: Secondary | ICD-10-CM

## 2020-11-14 NOTE — Assessment & Plan Note (Signed)
Anemia due to blood loss.

## 2020-11-14 NOTE — Assessment & Plan Note (Addendum)
Patient blood pressure is normal patient denies any chest pain or shortness of breath there is no history of palpitation paroxysmal nocturnal dyspnea patient can walkioo yards without any problem patient was advised to follow low-salt low-cholesterol diet   follow-up in 3 months, patient will call me back for any change in the cardiovascular symptoms

## 2020-11-14 NOTE — Assessment & Plan Note (Signed)
Incision in the left hip is healing well.  He is able to walk with the help of a walker.

## 2020-11-14 NOTE — Progress Notes (Signed)
New Patient Office Visit  Subjective:  Patient ID: Casey Rangel, male    DOB: 1950/10/03  Age: 70 y.o. MRN: [id number]  CC:  Chief Complaint  Patient presents with  . New Patient (Initial Visit)    HPI Patient presents for  New pt visit  Past Medical History:  Diagnosis Date  . GERD (gastroesophageal reflux disease)   . Hiatal hernia      Current Outpatient Medications:  .  docusate sodium (COLACE) 100 MG capsule, Take 1 capsule (100 mg total) by mouth 2 (two) times daily. (Patient not taking: Reported on 11/14/2020), Disp: 10 capsule, Rfl: 0 .  enoxaparin (LOVENOX) 40 MG/0.4ML injection, Inject 0.4 mLs (40 mg total) into the skin daily for 14 days., Disp: 5.6 mL, Rfl: 0 .  HYDROcodone-acetaminophen (NORCO/VICODIN) 5-325 MG tablet, Take 1 tablet by mouth every 4 (four) hours as needed for moderate pain (pain score 4-6). (Patient not taking: Reported on 11/14/2020), Disp: 30 tablet, Rfl: 0 .  omeprazole (PRILOSEC OTC) 20 MG tablet, Take 20 mg by mouth daily. (Patient not taking: Reported on 11/14/2020), Disp: , Rfl:  .  senna-docusate (SENOKOT-S) 8.6-50 MG tablet, Take 1 tablet by mouth 2 (two) times daily between meals as needed for mild constipation. (Patient not taking: Reported on 11/14/2020), Disp: 60 tablet, Rfl: 0 .  traZODone (DESYREL) 50 MG tablet, Take 1 tablet (50 mg total) by mouth at bedtime. (Patient not taking: Reported on 11/14/2020), Disp: , Rfl:  .  Vitamin D, Ergocalciferol, (DRISDOL) 1.25 MG (50000 UNIT) CAPS capsule, Take 1 capsule (50,000 Units total) by mouth every 7 (seven) days. (Patient not taking: Reported on 11/14/2020), Disp: 5 capsule, Rfl: 0   Past Surgical History:  Procedure Laterality Date  . INTRAMEDULLARY (IM) NAIL INTERTROCHANTERIC Left 10/17/2020   Procedure: INTRAMEDULLARY (IM) NAIL INTERTROCHANTRIC;  Surgeon: Kennedy Bucker, MD;  Location: [facility] ORS;  Service: Orthopedics;  Laterality: Left;  . T9-T11 fusion in 1990      History reviewed. No  pertinent family history.  Social History   Socioeconomic History  . Marital status: Single    Spouse name: Not on file  . Number of children: Not on file  . Years of education: Not on file  . Highest education level: Not on file  Occupational History  . Not on file  Tobacco Use  . Smoking status: Former Smoker    Packs/day: 1.50    Years: 30.00    Pack years: 45.00    Types: Cigarettes    Quit date: 2001    Years since quitting: 21.2  . Smokeless tobacco: Never Used  Substance and Sexual Activity  . Alcohol use: Not Currently  . Drug use: Never  . Sexual activity: Not on file  Other Topics Concern  . Not on file  Social History Narrative  . Not on file   Social Determinants of Health   Financial Resource Strain: Not on file  Food Insecurity: Not on file  Transportation Needs: Not on file  Physical Activity: Not on file  Stress: Not on file  Social Connections: Not on file  Intimate Partner Violence: Not on file    ROS Review of Systems  Constitutional: Negative for chills and fatigue.  HENT: Negative for ear pain, postnasal drip and sinus pain.   Respiratory: Negative.  Negative for cough.   Cardiovascular: Negative for chest pain.  Gastrointestinal: Negative.   Endocrine: Negative.   Genitourinary: Negative.   Musculoskeletal: Negative for back pain.  In 1991 patient broke his back.   In [place]   Broke lt hip  , now  Better  Walking  With walker  Skin: Negative for rash.  Neurological: Negative for syncope.  Psychiatric/Behavioral: Positive for behavioral problems. Negative for agitation and suicidal ideas. The patient is nervous/anxious and is hyperactive.        Pt was committed   In 1990    Objective:   Today's Vitals: BP 140/80   Pulse 85   Ht 5\' 10"  (1.778 m)   Wt 192 lb 8 oz (87.3 kg)   BMI 27.62 kg/m   Physical Exam Constitutional:      Appearance: He is normal weight.  HENT:     Head: Normocephalic.     Nose: Nose normal.      Mouth/Throat:     Mouth: Mucous membranes are moist.  Eyes:     Pupils: Pupils are equal, round, and reactive to light.  Cardiovascular:     Heart sounds: No murmur heard.   Pulmonary:     Breath sounds: No rales.  Abdominal:     General: Bowel sounds are normal.  Musculoskeletal:        General: No swelling.     Cervical back: No rigidity.  Skin:    General: Skin is warm.  Neurological:     General: No focal deficit present.     Mental Status: He is alert.  Psychiatric:        Mood and Affect: Mood normal.        Thought Content: Thought content normal.     Assessment & Plan:   Problem List Items Addressed This Visit      Cardiovascular and Mediastinum   Primary hypertension    Patient blood pressure is normal patient denies any chest pain or shortness of breath there is no history of palpitation paroxysmal nocturnal dyspnea patient can walkioo yards without any problem patient was advised to follow low-salt low-cholesterol diet   follow-up in 3 months, patient will call me back for any change in the cardiovascular symptoms           Digestive   GERD (gastroesophageal reflux disease) - Primary    - The patient's GERD is stable on medication.  - Instructed the patient to avoid eating spicy and acidic foods, as well as foods high in fat. - Instructed the patient to avoid eating large meals or meals 2-3 hours prior to sleeping.        Musculoskeletal and Integument   Closed left hip fracture (HCC)    Incision in the left hip is healing well.  He is able to walk with the help of a walker.        Other   Anemia    Anemia due to blood loss.         Outpatient Encounter Medications as of 11/14/2020  Medication Sig  . docusate sodium (COLACE) 100 MG capsule Take 1 capsule (100 mg total) by mouth 2 (two) times daily. (Patient not taking: Reported on 11/14/2020)  . enoxaparin (LOVENOX) 40 MG/0.4ML injection Inject 0.4 mLs (40 mg total) into the skin daily for 14  days.  Marland Kitchen HYDROcodone-acetaminophen (NORCO/VICODIN) 5-325 MG tablet Take 1 tablet by mouth every 4 (four) hours as needed for moderate pain (pain score 4-6). (Patient not taking: Reported on 11/14/2020)  . omeprazole (PRILOSEC OTC) 20 MG tablet Take 20 mg by mouth daily. (Patient not taking: Reported on 11/14/2020)  . senna-docusate (SENOKOT-S) 8.6-50  MG tablet Take 1 tablet by mouth 2 (two) times daily between meals as needed for mild constipation. (Patient not taking: Reported on 11/14/2020)  . traZODone (DESYREL) 50 MG tablet Take 1 tablet (50 mg total) by mouth at bedtime. (Patient not taking: Reported on 11/14/2020)  . Vitamin D, Ergocalciferol, (DRISDOL) 1.25 MG (50000 UNIT) CAPS capsule Take 1 capsule (50,000 Units total) by mouth every 7 (seven) days. (Patient not taking: Reported on 11/14/2020)   No facility-administered encounter medications on file as of 11/14/2020.  Patient was seen in the office after discharge from the hospital.  He was treated in the hospital for left intertrochanteric fracture, Hip surgery was done by Dr. Rosita Kea, patient is feeling good.  Patient blood pressure is mildly elevated he denies any chest pain or shortness of breath and is able to walk with the help of a walker.  He is known to have reflux with hiatal hernia but he stopped his Prilosec right now.  He has some problems with insomnia and is taking trazodone for that he refuses Covid vaccination.  He is borderline overweight and encouraged to lose weight.  Follow-up: No follow-ups on file.   Corky Downs, MD

## 2020-11-14 NOTE — Assessment & Plan Note (Signed)
-   The patient's GERD is stable on medication.  - Instructed the patient to avoid eating spicy and acidic foods, as well as foods high in fat. - Instructed the patient to avoid eating large meals or meals 2-3 hours prior to sleeping. 

## 2020-12-13 ENCOUNTER — Ambulatory Visit: Payer: Self-pay | Admitting: Family Medicine

## 2020-12-18 ENCOUNTER — Ambulatory Visit: Payer: Self-pay | Admitting: Internal Medicine

## 2021-02-06 ENCOUNTER — Ambulatory Visit: Payer: Self-pay | Admitting: Family Medicine

## 2022-01-15 ENCOUNTER — Emergency Department (EMERGENCY_DEPARTMENT_HOSPITAL): Payer: Medicare Other | Admitting: Anesthesiology

## 2022-01-15 ENCOUNTER — Inpatient Hospital Stay (HOSPITAL_COMMUNITY)
Admission: EM | Admit: 2022-01-15 | Discharge: 2022-01-21 | DRG: 023 | Disposition: A | Discharge status: Skilled Nursing Facility | Payer: Medicare Other | Attending: Neurology | Admitting: Neurology

## 2022-01-15 ENCOUNTER — Emergency Department (HOSPITAL_COMMUNITY): Payer: Medicare Other | Admitting: Anesthesiology

## 2022-01-15 ENCOUNTER — Encounter (HOSPITAL_COMMUNITY)
Admission: EM | Disposition: A | Discharge status: Skilled Nursing Facility | Payer: Self-pay | Source: Home / Self Care | Attending: Neurology

## 2022-01-15 ENCOUNTER — Emergency Department (HOSPITAL_COMMUNITY): Payer: Medicare Other

## 2022-01-15 DIAGNOSIS — G8314 Monoplegia of lower limb affecting left nondominant side: Secondary | ICD-10-CM | POA: Diagnosis present

## 2022-01-15 DIAGNOSIS — I63522 Cerebral infarction due to unspecified occlusion or stenosis of left anterior cerebral artery: Secondary | ICD-10-CM

## 2022-01-15 DIAGNOSIS — I639 Cerebral infarction, unspecified: Principal | ICD-10-CM

## 2022-01-15 DIAGNOSIS — Z96642 Presence of left artificial hip joint: Secondary | ICD-10-CM | POA: Diagnosis present

## 2022-01-15 DIAGNOSIS — Z880 Allergy status to penicillin: Secondary | ICD-10-CM | POA: Diagnosis not present

## 2022-01-15 DIAGNOSIS — G8191 Hemiplegia, unspecified affecting right dominant side: Secondary | ICD-10-CM | POA: Diagnosis present

## 2022-01-15 DIAGNOSIS — R29726 NIHSS score 26: Secondary | ICD-10-CM | POA: Diagnosis present

## 2022-01-15 DIAGNOSIS — I10 Essential (primary) hypertension: Secondary | ICD-10-CM

## 2022-01-15 DIAGNOSIS — Z87891 Personal history of nicotine dependence: Secondary | ICD-10-CM

## 2022-01-15 DIAGNOSIS — I110 Hypertensive heart disease with heart failure: Secondary | ICD-10-CM | POA: Diagnosis present

## 2022-01-15 DIAGNOSIS — I63512 Cerebral infarction due to unspecified occlusion or stenosis of left middle cerebral artery: Secondary | ICD-10-CM | POA: Diagnosis present

## 2022-01-15 DIAGNOSIS — E785 Hyperlipidemia, unspecified: Secondary | ICD-10-CM | POA: Diagnosis present

## 2022-01-15 DIAGNOSIS — R29810 Facial weakness: Secondary | ICD-10-CM | POA: Diagnosis present

## 2022-01-15 DIAGNOSIS — R4701 Aphasia: Secondary | ICD-10-CM | POA: Diagnosis present

## 2022-01-15 DIAGNOSIS — Z20822 Contact with and (suspected) exposure to covid-19: Secondary | ICD-10-CM | POA: Diagnosis present

## 2022-01-15 DIAGNOSIS — F05 Delirium due to known physiological condition: Secondary | ICD-10-CM | POA: Diagnosis present

## 2022-01-15 DIAGNOSIS — H5347 Heteronymous bilateral field defects: Secondary | ICD-10-CM | POA: Diagnosis present

## 2022-01-15 DIAGNOSIS — I7121 Aneurysm of the ascending aorta, without rupture: Secondary | ICD-10-CM | POA: Diagnosis present

## 2022-01-15 DIAGNOSIS — R1312 Dysphagia, oropharyngeal phase: Secondary | ICD-10-CM | POA: Diagnosis present

## 2022-01-15 DIAGNOSIS — I63311 Cerebral infarction due to thrombosis of right middle cerebral artery: Secondary | ICD-10-CM | POA: Diagnosis present

## 2022-01-15 DIAGNOSIS — R471 Dysarthria and anarthria: Secondary | ICD-10-CM | POA: Diagnosis present

## 2022-01-15 DIAGNOSIS — I63232 Cerebral infarction due to unspecified occlusion or stenosis of left carotid arteries: Secondary | ICD-10-CM

## 2022-01-15 DIAGNOSIS — Z6826 Body mass index (BMI) 26.0-26.9, adult: Secondary | ICD-10-CM | POA: Diagnosis not present

## 2022-01-15 DIAGNOSIS — R0902 Hypoxemia: Secondary | ICD-10-CM | POA: Diagnosis not present

## 2022-01-15 DIAGNOSIS — E440 Moderate protein-calorie malnutrition: Secondary | ICD-10-CM | POA: Insufficient documentation

## 2022-01-15 DIAGNOSIS — I429 Cardiomyopathy, unspecified: Secondary | ICD-10-CM | POA: Diagnosis present

## 2022-01-15 DIAGNOSIS — I5041 Acute combined systolic (congestive) and diastolic (congestive) heart failure: Secondary | ICD-10-CM | POA: Diagnosis present

## 2022-01-15 HISTORY — PX: RADIOLOGY WITH ANESTHESIA: SHX6223

## 2022-01-15 HISTORY — PX: IR US GUIDE VASC ACCESS RIGHT: IMG2390

## 2022-01-15 HISTORY — PX: IR CT HEAD LTD: IMG2386

## 2022-01-15 HISTORY — PX: IR PTA INTRACRANIAL: IMG2344

## 2022-01-15 HISTORY — PX: IR PERCUTANEOUS ART THROMBECTOMY/INFUSION INTRACRANIAL INC DIAG ANGIO: IMG6087

## 2022-01-15 LAB — URINALYSIS, ROUTINE W REFLEX MICROSCOPIC
Bilirubin Urine: NEGATIVE
Glucose, UA: NEGATIVE mg/dL
Ketones, ur: 80 mg/dL — AB
Leukocytes,Ua: NEGATIVE
Nitrite: NEGATIVE
Protein, ur: NEGATIVE mg/dL
Specific Gravity, Urine: 1.039 — ABNORMAL HIGH (ref 1.005–1.030)
pH: 5 (ref 5.0–8.0)

## 2022-01-15 LAB — CBC
HCT: 49.3 % (ref 39.0–52.0)
Hemoglobin: 16.4 g/dL (ref 13.0–17.0)
MCH: 31.5 pg (ref 26.0–34.0)
MCHC: 33.3 g/dL (ref 30.0–36.0)
MCV: 94.6 fL (ref 80.0–100.0)
Platelets: 277 10*3/uL (ref 150–400)
RBC: 5.21 MIL/uL (ref 4.22–5.81)
RDW: 13.1 % (ref 11.5–15.5)
WBC: 16.3 10*3/uL — ABNORMAL HIGH (ref 4.0–10.5)
nRBC: 0 % (ref 0.0–0.2)

## 2022-01-15 LAB — DIFFERENTIAL
Abs Immature Granulocytes: 0.08 10*3/uL — ABNORMAL HIGH (ref 0.00–0.07)
Basophils Absolute: 0.1 10*3/uL (ref 0.0–0.1)
Basophils Relative: 1 %
Eosinophils Absolute: 0 10*3/uL (ref 0.0–0.5)
Eosinophils Relative: 0 %
Immature Granulocytes: 1 %
Lymphocytes Relative: 6 %
Lymphs Abs: 1 10*3/uL (ref 0.7–4.0)
Monocytes Absolute: 1.1 10*3/uL — ABNORMAL HIGH (ref 0.1–1.0)
Monocytes Relative: 7 %
Neutro Abs: 13.9 10*3/uL — ABNORMAL HIGH (ref 1.7–7.7)
Neutrophils Relative %: 85 %

## 2022-01-15 LAB — COMPREHENSIVE METABOLIC PANEL
ALT: 15 U/L (ref 0–44)
AST: 28 U/L (ref 15–41)
Albumin: 3.9 g/dL (ref 3.5–5.0)
Alkaline Phosphatase: 56 U/L (ref 38–126)
Anion gap: 13 (ref 5–15)
BUN: 28 mg/dL — ABNORMAL HIGH (ref 8–23)
CO2: 16 mmol/L — ABNORMAL LOW (ref 22–32)
Calcium: 9.1 mg/dL (ref 8.9–10.3)
Chloride: 112 mmol/L — ABNORMAL HIGH (ref 98–111)
Creatinine, Ser: 0.98 mg/dL (ref 0.61–1.24)
GFR, Estimated: >60 mL/min (ref >60)
Glucose, Bld: 102 mg/dL — ABNORMAL HIGH (ref 70–99)
Potassium: 4.1 mmol/L (ref 3.5–5.1)
Sodium: 141 mmol/L (ref 135–145)
Total Bilirubin: 1.7 mg/dL — ABNORMAL HIGH (ref 0.3–1.2)
Total Protein: 6.7 g/dL (ref 6.5–8.1)

## 2022-01-15 LAB — I-STAT CHEM 8, ED
BUN: 34 mg/dL — ABNORMAL HIGH (ref 8–23)
Calcium, Ion: 1.06 mmol/L — ABNORMAL LOW (ref 1.15–1.40)
Chloride: 112 mmol/L — ABNORMAL HIGH (ref 98–111)
Creatinine, Ser: 0.8 mg/dL (ref 0.61–1.24)
Glucose, Bld: 106 mg/dL — ABNORMAL HIGH (ref 70–99)
HCT: 48 % (ref 39.0–52.0)
Hemoglobin: 16.3 g/dL (ref 13.0–17.0)
Potassium: 3.8 mmol/L (ref 3.5–5.1)
Sodium: 143 mmol/L (ref 135–145)
TCO2: 18 mmol/L — ABNORMAL LOW (ref 22–32)

## 2022-01-15 LAB — RESP PANEL BY RT-PCR (FLU A&B, COVID) ARPGX2
Influenza A by PCR: NEGATIVE
Influenza B by PCR: NEGATIVE
SARS Coronavirus 2 by RT PCR: NEGATIVE

## 2022-01-15 LAB — APTT: aPTT: 24 seconds (ref 24–36)

## 2022-01-15 LAB — RAPID URINE DRUG SCREEN, HOSP PERFORMED
Amphetamines: NOT DETECTED
Barbiturates: NOT DETECTED
Benzodiazepines: NOT DETECTED
Cocaine: NOT DETECTED
Opiates: NOT DETECTED
Tetrahydrocannabinol: NOT DETECTED

## 2022-01-15 LAB — PROTIME-INR
INR: 1.1 (ref 0.8–1.2)
Prothrombin Time: 14.1 seconds (ref 11.4–15.2)

## 2022-01-15 LAB — ETHANOL: Alcohol, Ethyl (B): <10 mg/dL (ref <10)

## 2022-01-15 LAB — CBG MONITORING, ED: Glucose-Capillary: 102 mg/dL — ABNORMAL HIGH (ref 70–99)

## 2022-01-15 LAB — GLUCOSE, CAPILLARY: Glucose-Capillary: 130 mg/dL — ABNORMAL HIGH (ref 70–99)

## 2022-01-15 IMAGING — CT CT HEAD CODE STROKE
3 series · 15 of 47 positions shown, 18 images · non-contrast
Comparison: None available.

CLINICAL DATA: Code stroke.



[Series 2: head 5.0 st · axial · 0.43mm/px · z∈[-78,+87]mm · 9 of 39 slices shown, 12 images]
[im 3/39  brain]
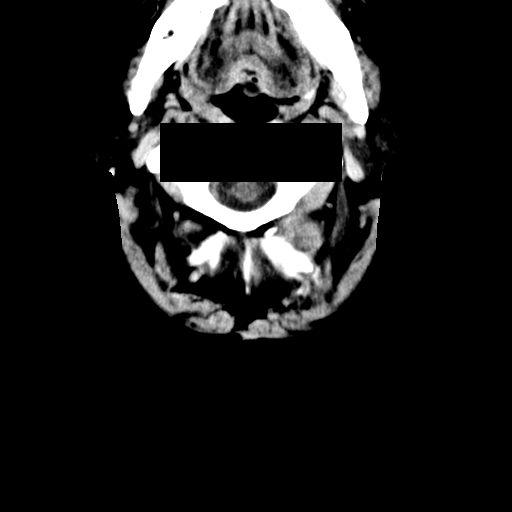
[im 3/39  bone]
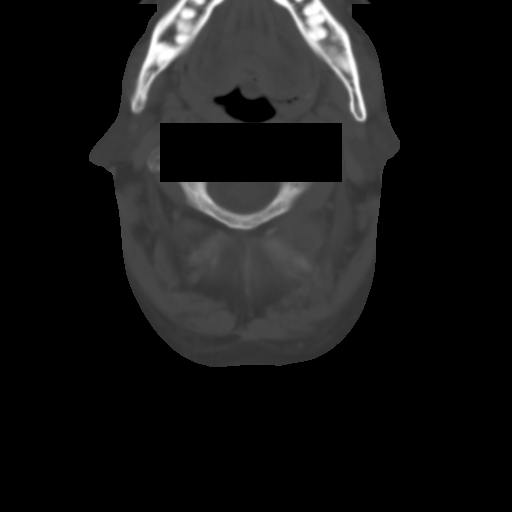
[im 7/39  brain]
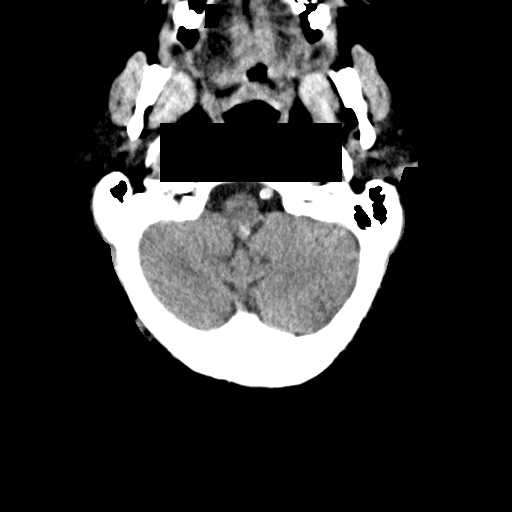
[im 11/39  brain]
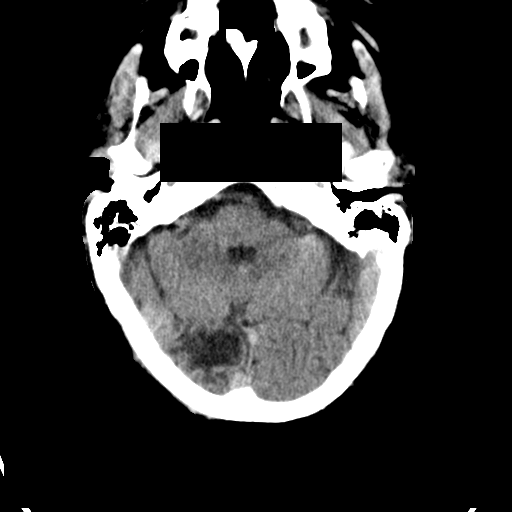
[im 15/39  brain]
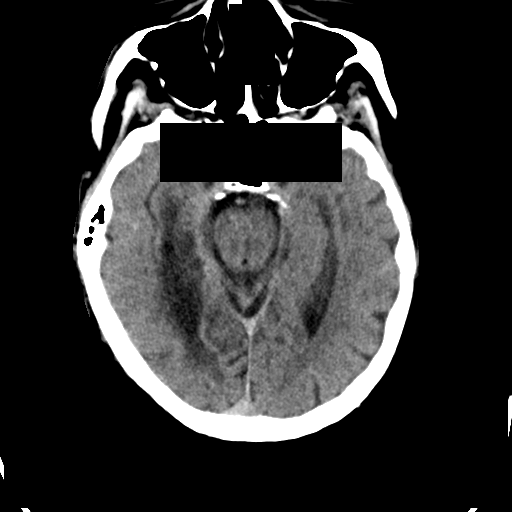
[im 20/39  brain]
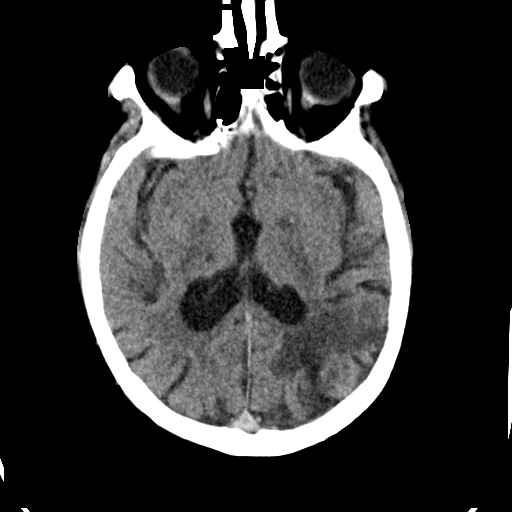
[im 20/39  bone]
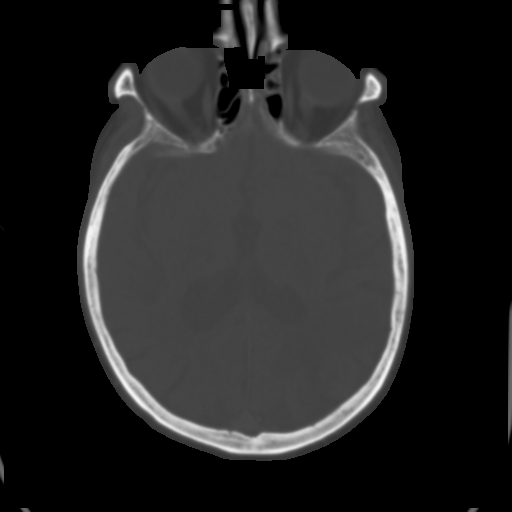
[im 24/39  brain]
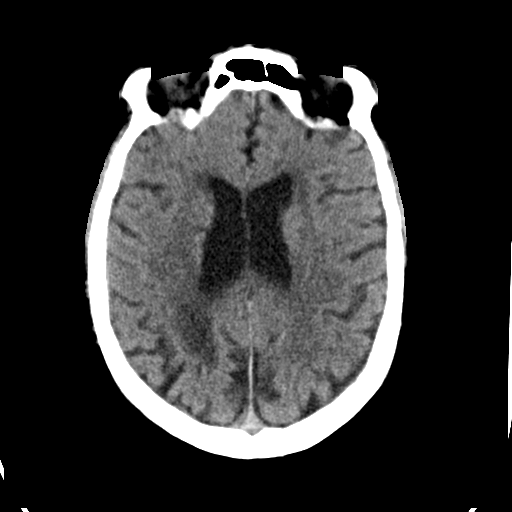
[im 28/39  brain]
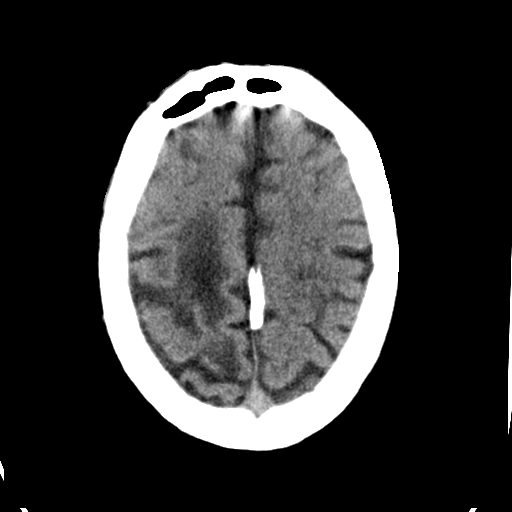
[im 32/39  brain]
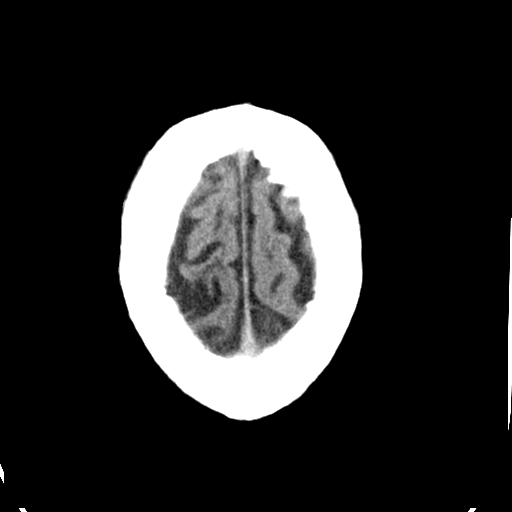
[im 36/39  brain]
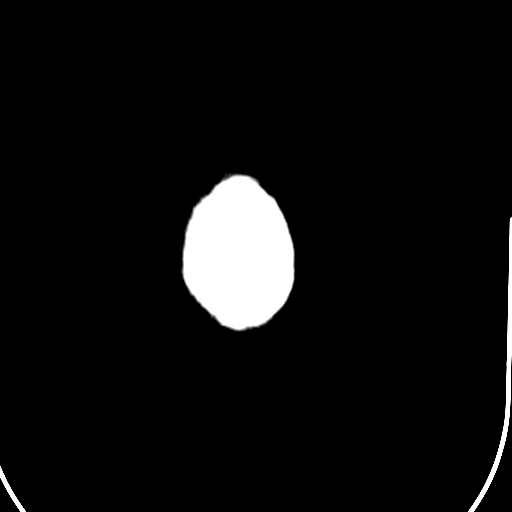
[im 36/39  bone]
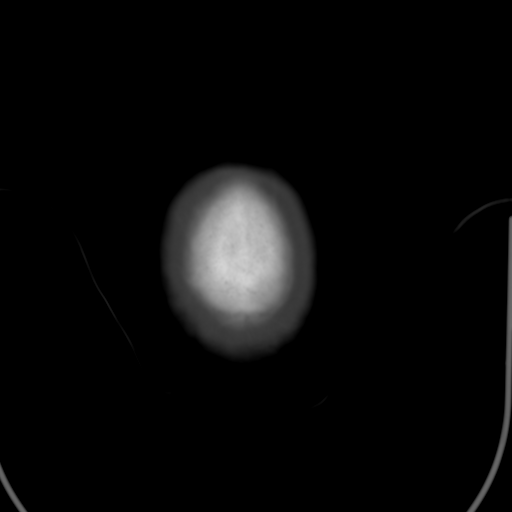

[Series 4: head 3.0 cor st · coronal · 0.35mm/px · 3 of 70 slices shown]
[im 27/70  brain]
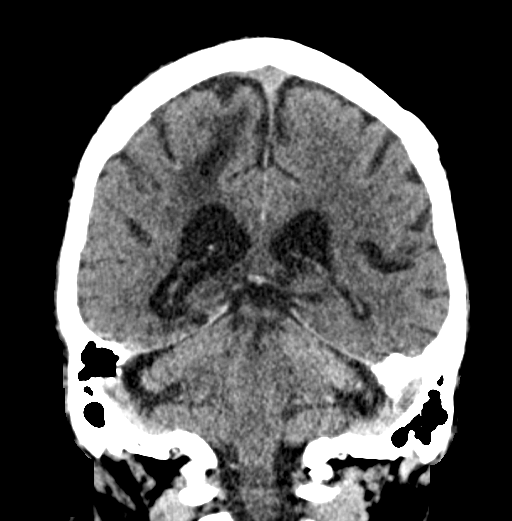
[im 32/70  brain]
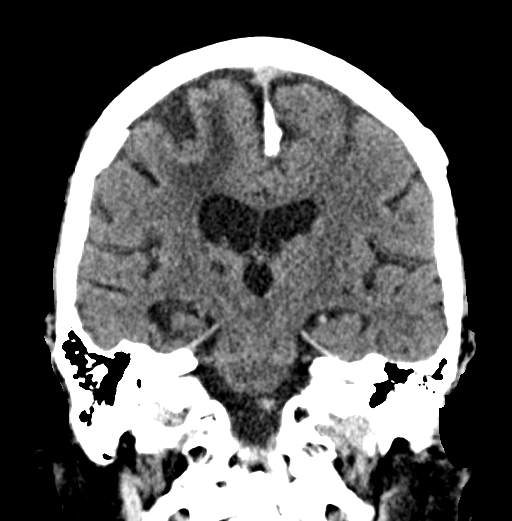
[im 38/70  brain]
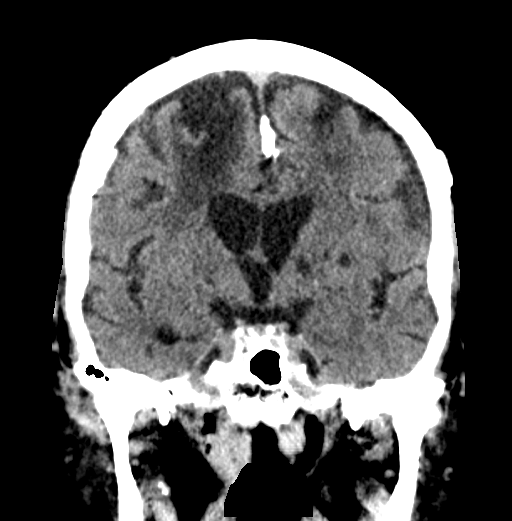

[Series 6: head 3.0 sag st · sagittal · 0.35mm/px · 3 of 62 slices shown]
[im 21/62  brain]
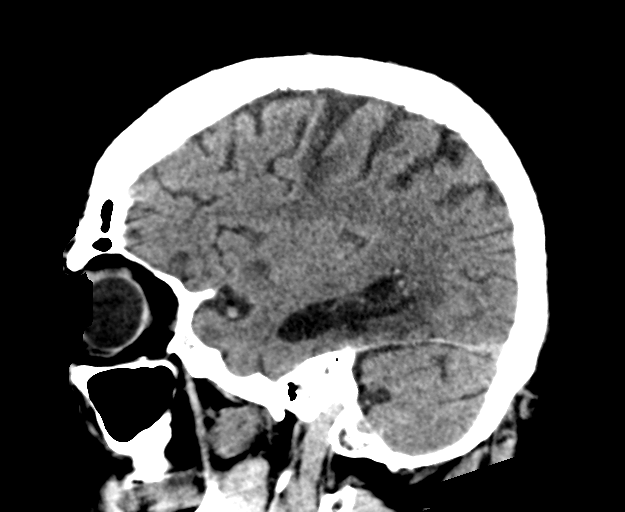
[im 31/62  brain]
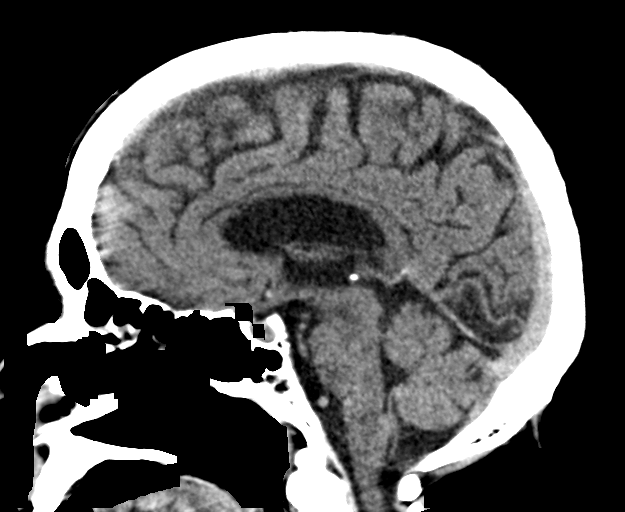
[im 41/62  brain]
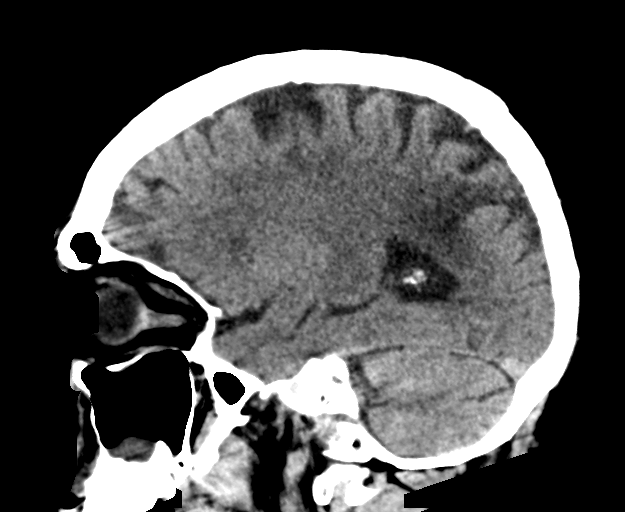

[15 of 47 positions shown; findings below may reference images not displayed]

FINDINGS: Brain: Generalized age-related cerebral atrophy with moderately
advanced chronic microvascular ischemic disease. Several remote
lacunar infarcts present about the bilateral basal ganglia and
thalami. Multifocal areas of encephalomalacia involving the right
parietal and occipital lobes as well as the left temporal occipital
region, consistent with chronic ischemic infarcts. Small remote
right cerebellar infarct.

No acute intracranial hemorrhage. Note made of a 7 mm partially
calcified lesion at the fourth ventricular outflow tract (series 2,
image 7), indeterminate, but favored to be vascular in nature,
possibly reflecting aneurysm. No other mass lesion, mass effect or
midline shift. No hydrocephalus or extra-axial fluid collection.

Vascular: No hyperdense vessel. 7 mm fourth ventricular lesion as
above. Calcified atherosclerosis present at skull base.

Skull: Scalp soft tissues and calvarium within normal limits.

Sinuses/Orbits: Left gaze noted. Scattered mucosal thickening noted
about the ethmoidal air cells and maxillary sinuses. Mastoid air
cells are clear.

Other: None.

ASPECTS (Alberta Stroke Program Early CT Score)

- Ganglionic level infarction (caudate, lentiform nuclei, internal
capsule, insula, M1-M3 cortex): 7

- Supraganglionic infarction (M4-M6 cortex): 3

Total score (0-10 with 10 being normal): 10
IMPRESSION: 1. No acute intracranial abnormality.
2. ASPECTS is 10.
3. Atrophy with moderate chronic small vessel ischemic disease with
multiple remote ischemic infarcts as above.
4. Approximate 7 mm lesion at the level of the fourth ventricular
outflow tract, indeterminate, but favored to be vascular in nature,
potentially reflecting aneurysm.

These results were communicated to Dr. TIGER at [DATE] on
[DATE] by text page via the [employer] messaging system.

## 2022-01-15 IMAGING — MR MR HEAD W/O CM
6 of 10 series · 27 of 48 positions shown · non-contrast
Comparison: Prior CTs from earlier the same day.

CLINICAL DATA: Initial evaluation for acute neuro deficit, stroke.

EXAM:
MRI HEAD WITHOUT CONTRAST
TECHNIQUE: Multiplanar, multiecho pulse sequences of the brain and surrounding
structures were obtained without intravenous contrast.

[Series 2: DWI · axial · 3.0mm · 0.94mm/px · z∈[-9,+132]mm · 8 of 102 slices shown (1 of 2)]
[im 1/102]
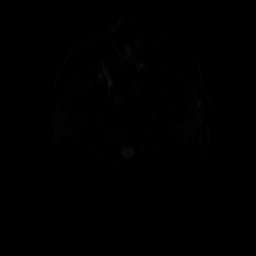
[im 12/102]
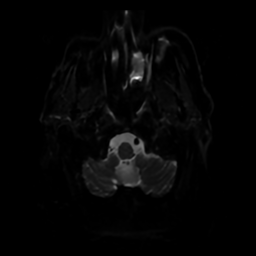
[im 34/102]
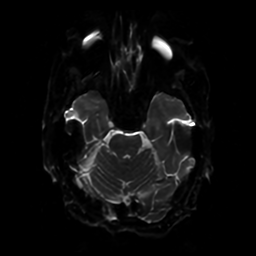
[im 45/102]
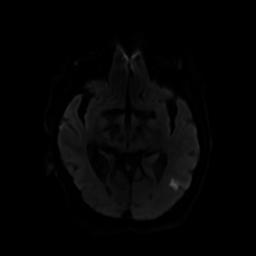
[im 57/102]
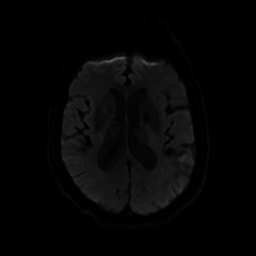
[im 68/102]
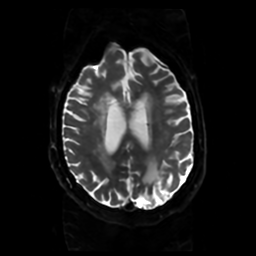
[im 90/102]
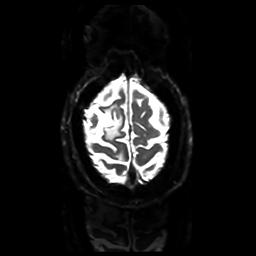
[im 102/102]
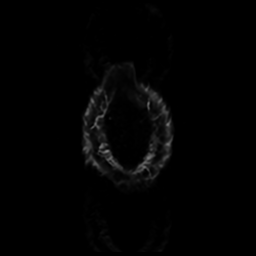

[Series 3: DWI · coronal · 4.0mm · 0.94mm/px · 6 of 69 slices shown (2 of 2)]
[im 1/69]
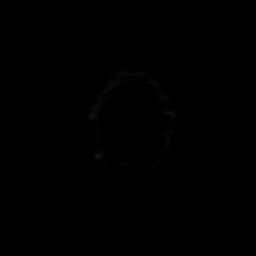
[im 14/69]
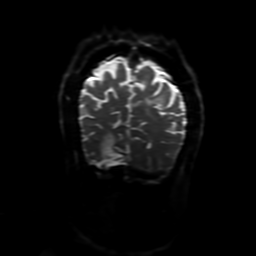
[im 28/69]
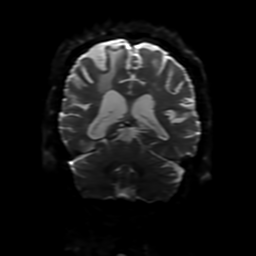
[im 41/69]
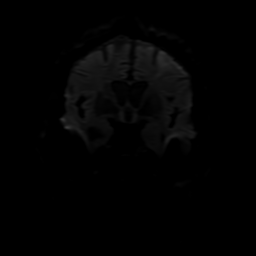
[im 55/69]
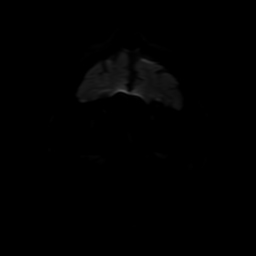
[im 69/69]
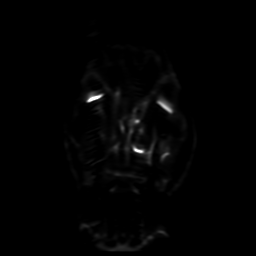

[Series 4: FLAIR · axial · 4.0mm · 0.45mm/px · z∈[+5,+140]mm · 3 of 36 slices shown (1 of 2)]
[im 1/36]
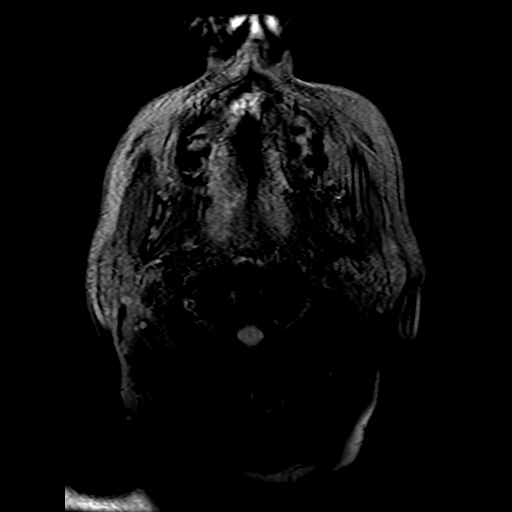
[im 18/36]
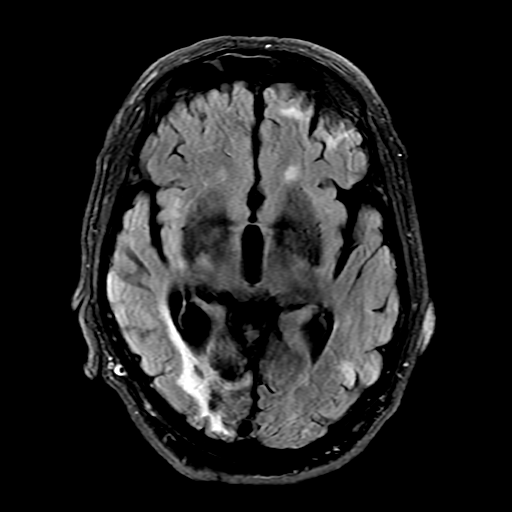
[im 36/36]
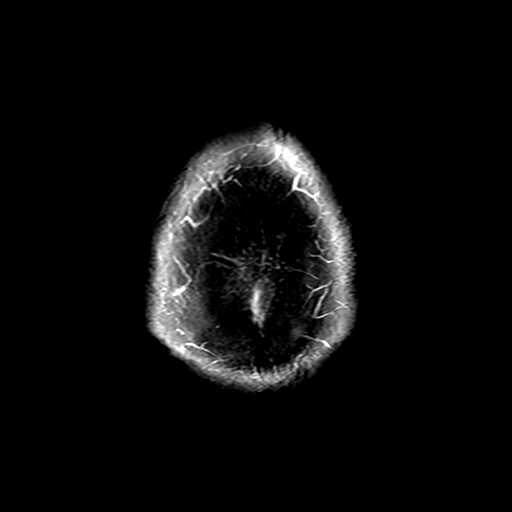

[Series 7: FLAIR · sagittal · 5.0mm · 0.23mm/px · 2 of 22 slices shown (2 of 2)]
[im 1/22]
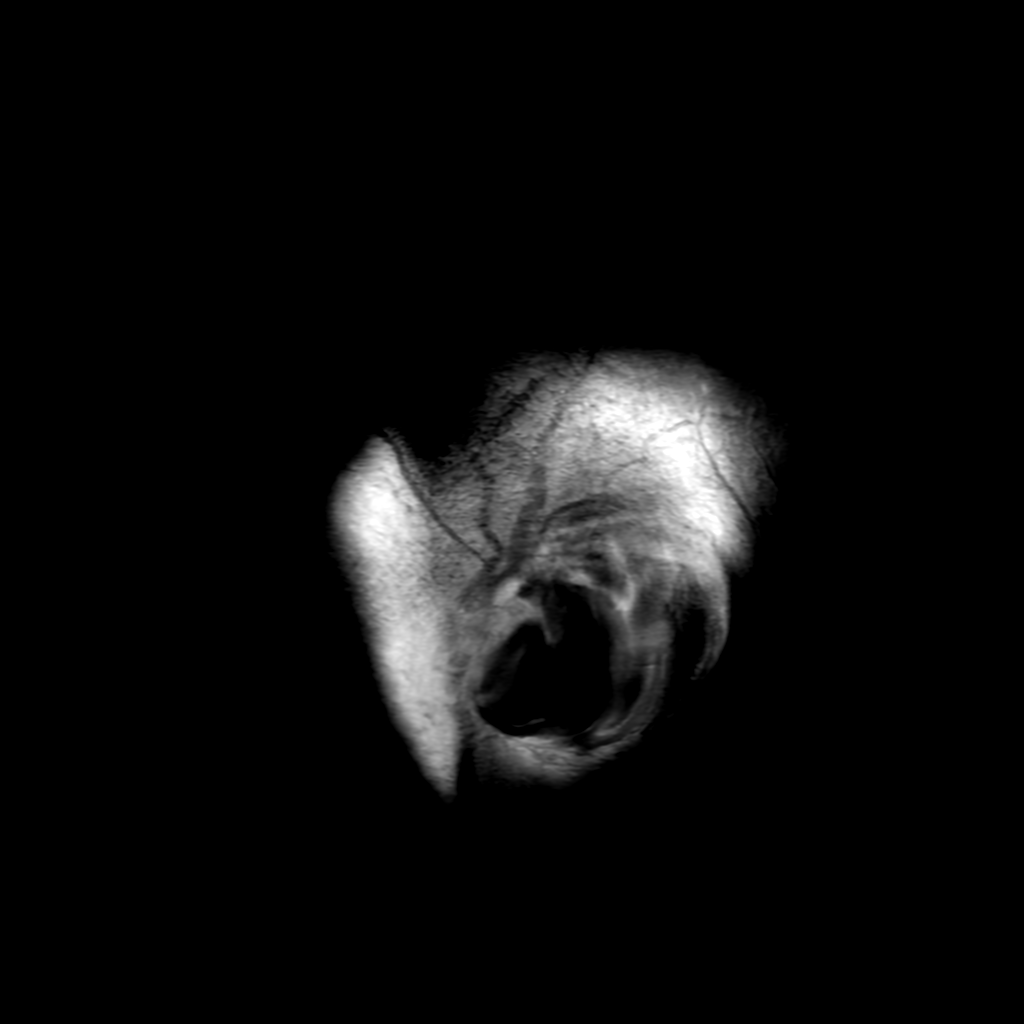
[im 22/22]
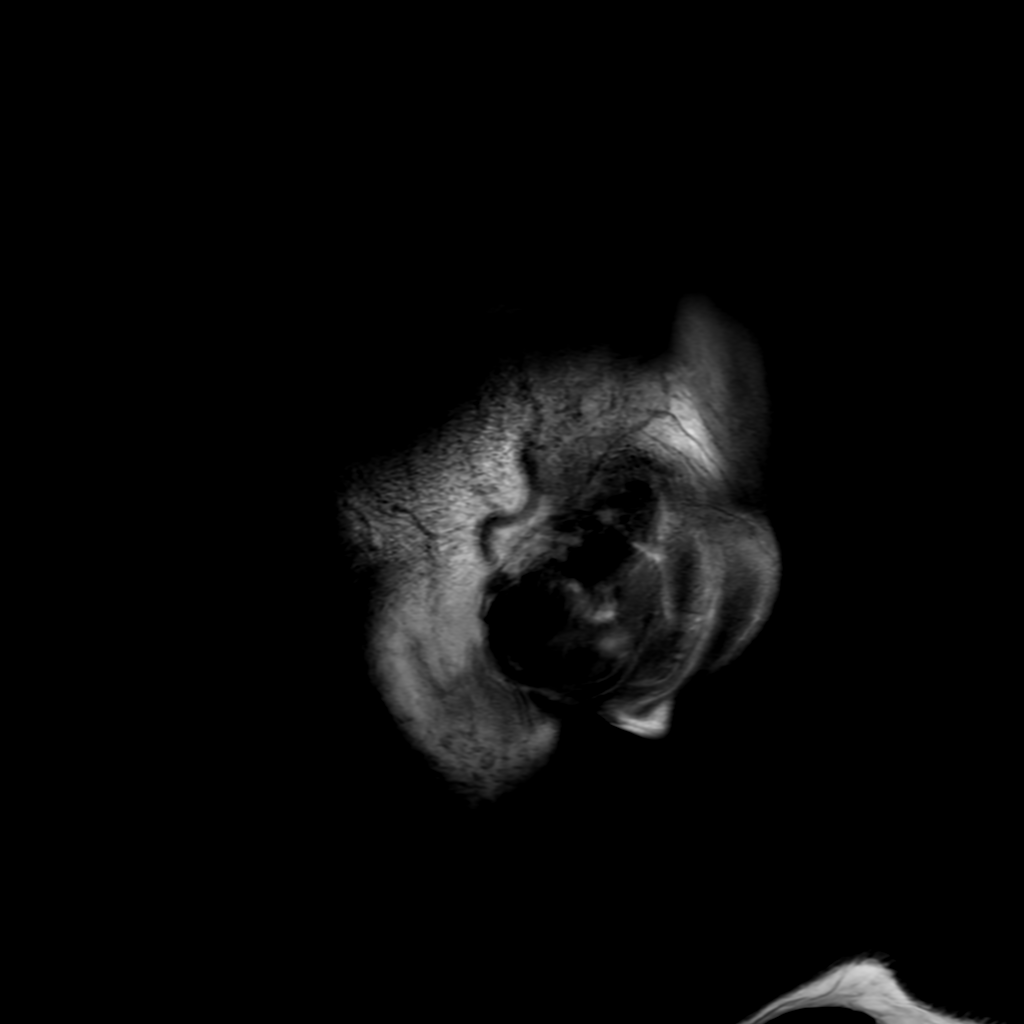

[Series 250: ADC · axial · 3.0mm · 0.94mm/px · z∈[-9,+132]mm · 5 of 50 slices shown (1 of 2)]
[im 1/50]
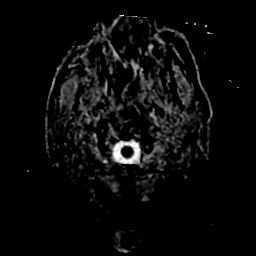
[im 13/50]
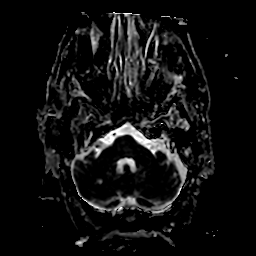
[im 25/50]
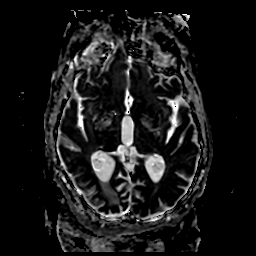
[im 37/50]
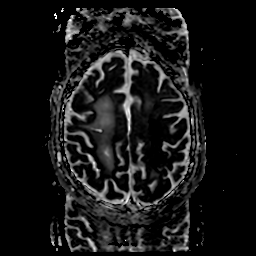
[im 50/50]
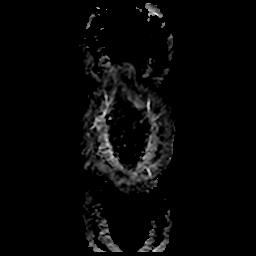

[Series 350: ADC · coronal · 4.0mm · 0.94mm/px · 3 of 35 slices shown (2 of 2)]
[im 1/35]
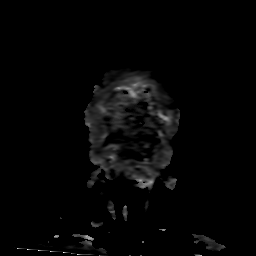
[im 18/35]
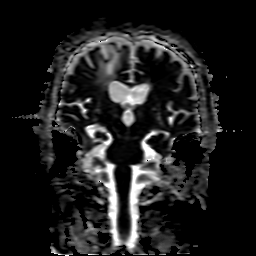
[im 35/35]
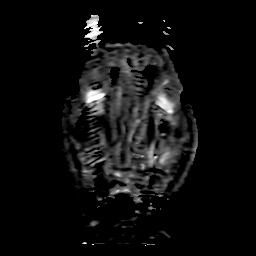

[27 of 48 positions shown; findings below may reference images not displayed]

FINDINGS: Brain: Examination degraded by motion artifact.

Generalized age-related cerebral atrophy. Moderately advanced
chronic microvascular ischemic disease seen involving the
supratentorial cerebral white matter. Multiple scatter remote
cortical to subcortical infarcts noted involving the right frontal
and parietal lobes, left parietal lobe, and right occipital lobe.
Small remote right cerebellar infarct. Multiple remote lacunar
infarcts present about the deep gray nuclei/corona radiata. Mild
scattered chronic hemosiderin staining noted about a few of these
chronic infarcts.

Patchy restricted diffusion involving the cortical and subcortical
aspect of the left frontal, parietal, and temporal lobes, consistent
with acute left MCA distribution infarct. Overall distribution is
watershed in nature, consistent with previously identified severe
near occlusive stenosis at the left ICA terminus. No associated
hemorrhage or mass effect. No other evidence for acute or subacute
ischemia. No acute intracranial hemorrhage.

No mass lesion, mass effect or midline shift. Previously noted
finding at the fourth ventricular outflow tract on prior CT most
likely reflects cord plexus. No hydrocephalus or extra-axial fluid
collection. Pituitary gland suprasellar region within normal limits.

Vascular: Major intracranial vascular flow voids are maintained.

Skull and upper cervical spine: Craniocervical junction within
normal limits. Bone marrow signal intensity normal. No scalp soft
tissue abnormality.

Sinuses/Orbits: Globes and orbital soft tissues demonstrate no acute
finding. Scattered mucosal thickening noted throughout the paranasal
sinuses. Trace right mastoid effusion, of doubtful significance.

Other: None.
IMPRESSION: 1. Patchy acute ischemic nonhemorrhagic left MCA distribution
infarct, watershed in distribution. Finding is in keeping with the
previously identified severe near occlusive stenosis at the left ICA
terminus. No associated hemorrhage or significant mass effect.
2. No other acute intracranial abnormality.
3. Underlying age-related cerebral atrophy with extensive chronic
ischemic changes as above.

## 2022-01-15 IMAGING — XA IR PERCUTANEOUS ART THORMBECTOMY/INFUSION INTRACRANIAL INCLUDE D
11 of 15 series · 12 of 24 positions shown · non-contrast
Comparison: None Available.

INDICATION: 70-year-old male with past medical history significant for multiple
prior ischemic infarcts and hip replacement as well as healthcare
avoidance presenting with aphasia and right-sided hemiplegia, NIHSS
26. Baseline modified NOKMAN 0-1. His last known well was 3
days ago when he started to develop confusion. However, at this
point he did not have weakness or aphasia. He was found at 5 p.m. on
[DATE] with right-sided weakness and aphasia. Head CT showed
multiple remote infarcts as well as age indeterminate infarct in the
left MCA territory. No hemorrhage seen. CT/CT angiogram of the head
and neck showed near occlusive stenosis of the left ICA terminus
with likely presence of thrombus. CT perfusion showed no core
infarct with approximately 190 mL of ischemic salvageable tissue.
MRI was performed showing a watershed left MCA-ACA and MCA-PCA
territory infarct with volume much lower than the estimated tissue
at risk by CT perfusion. Findings discussed with sister who
consented for endovascular intervention.

EXAM:
ULTRASOUND-GUIDED VASCULAR [REDACTED] CEREBRAL ANGIOGRAM
MECHANICAL THROMBECTOMY
INTRACRANIAL ANGIOPLASTY
FLAT PANEL HEAD CT
TECHNIQUE: Informed written consent was obtained from the patient's sister
after a thorough discussion of the procedural risks, benefits and
alternatives. All questions were addressed.

[Series 2: cerebral care 2 · 2 acquisitions, 1 frame shown (1 of 9)]
[im 1/2]
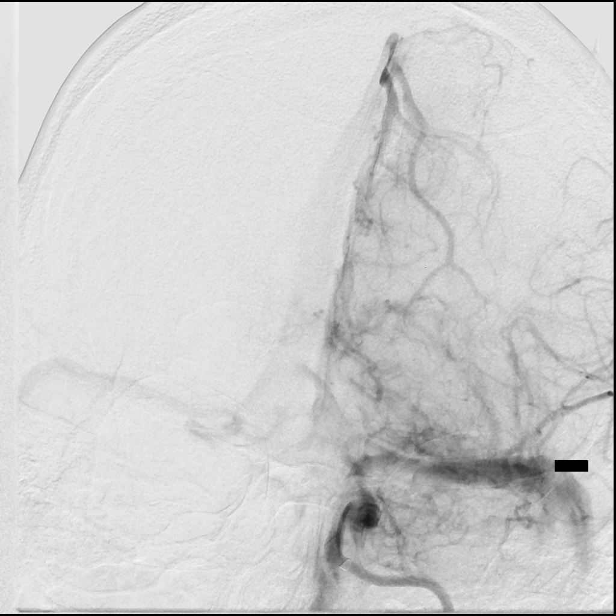

[Series 3: cerebral care 2 · 2 acquisitions, 1 frame shown (2 of 9)]
[im 1/2]
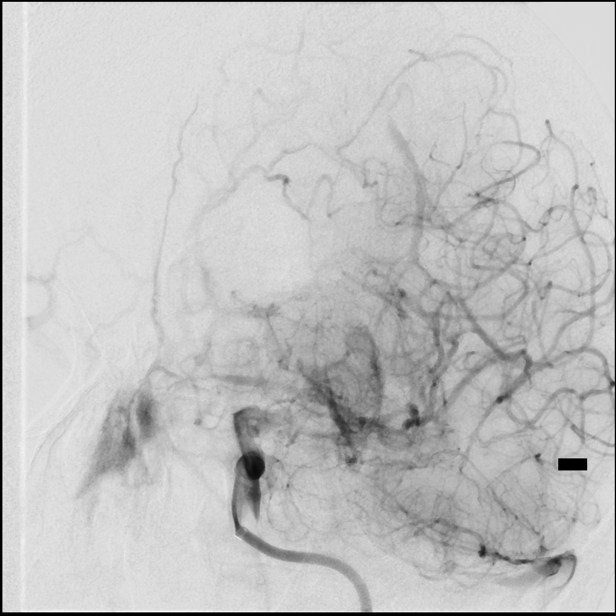

[Series 5: cerebral care 2 · 2 acquisitions, 1 frame shown (3 of 9)]
[im 1/2]
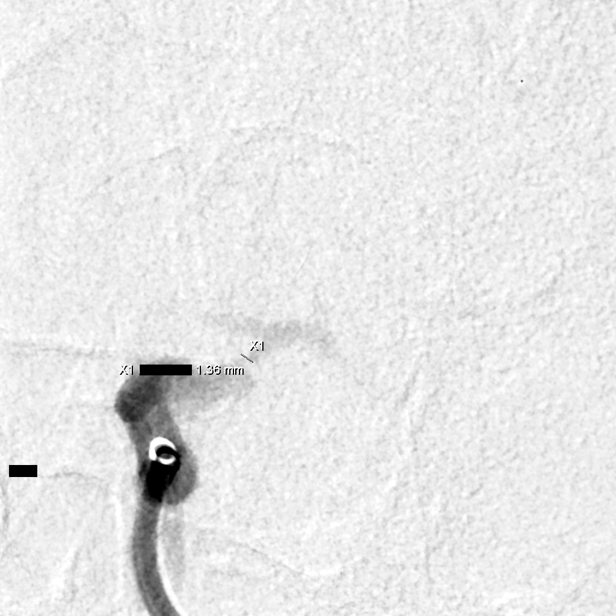

[Series 6: cerebral care 2 · 2 acquisitions, 1 frame shown (4 of 9)]
[im 1/2]
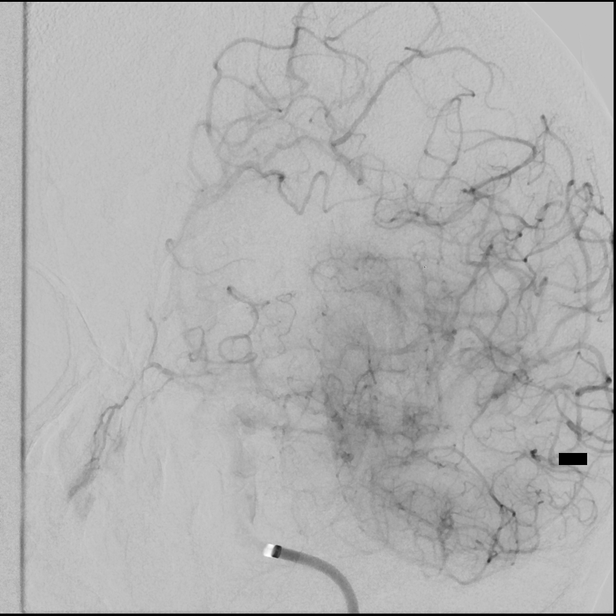

[Series 7: cerebral care 2 · 2 acquisitions, 1 frame shown (5 of 9)]
[im 1/2]
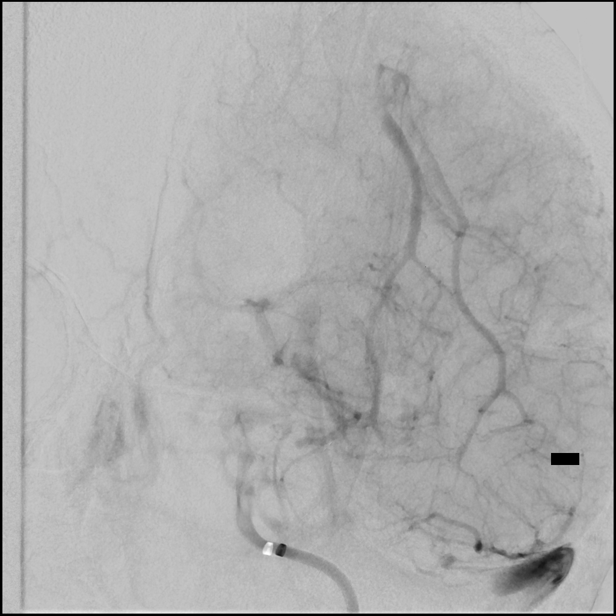

[Series 10: cerebral care 2 · 2 acquisitions, 1 frame shown (6 of 9)]
[im 1/2]
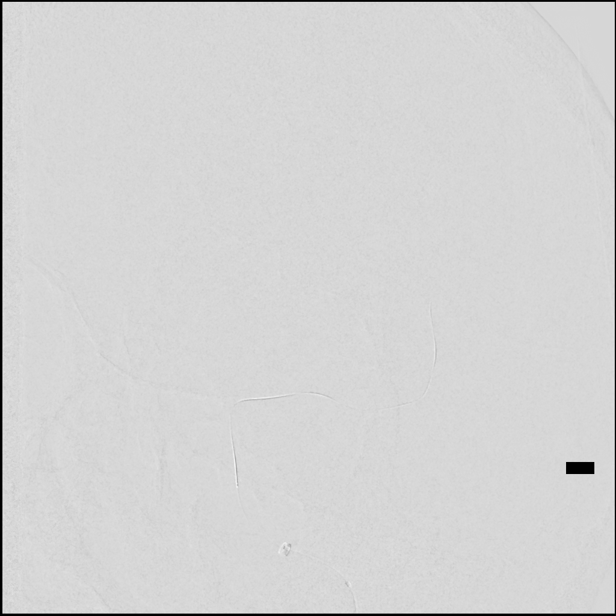

[Series 11: cerebral care 2 · 2 acquisitions, 1 frame shown (7 of 9)]
[im 1/2]
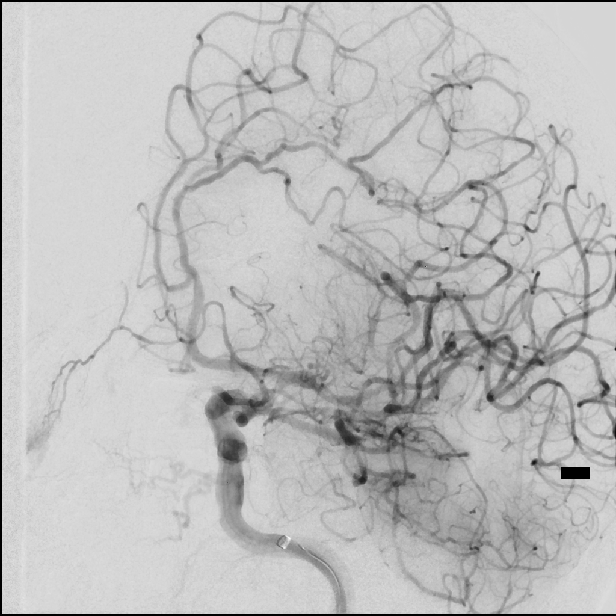

[Series 12: cerebral care 2 · 2 acquisitions, 1 frame shown (8 of 9)]
[im 1/2]
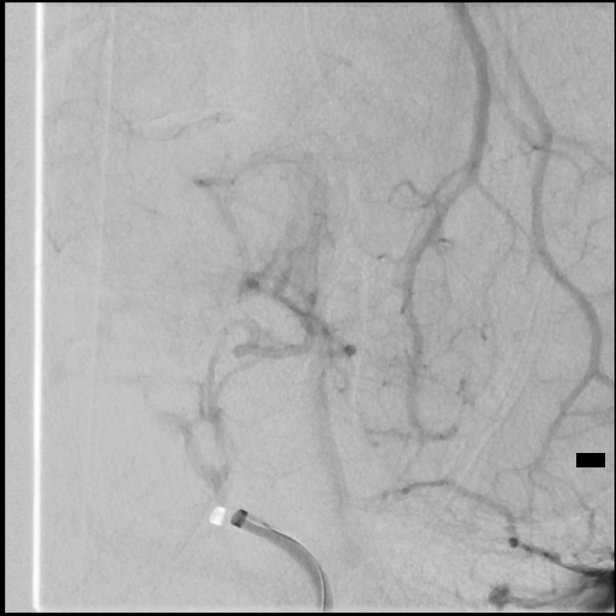

[Series 15: fl neuro n · 2 acquisitions, 1 frame shown]
[im 1/2]
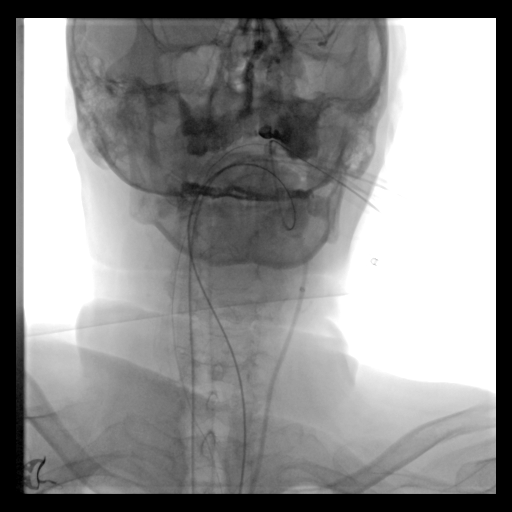

[Series 16: cerebral care 2 · 2 acquisitions, 1 frame shown (9 of 9)]
[im 1/2]
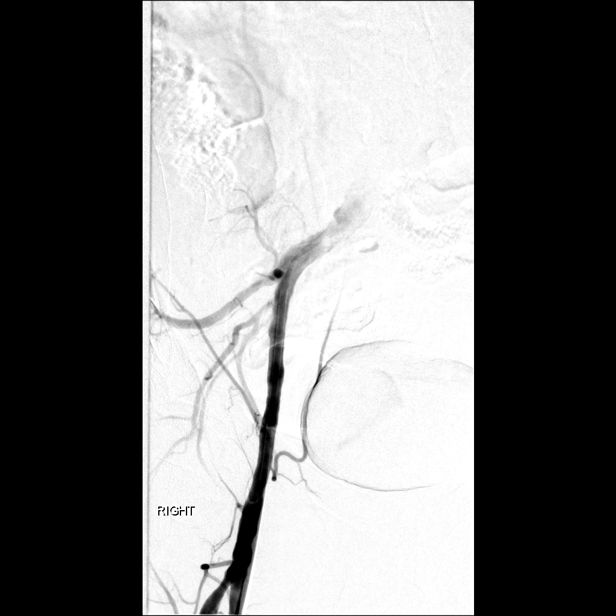

[Series 300: dr. (person_name). · 2 of 29 slices shown]
[im 11/29]
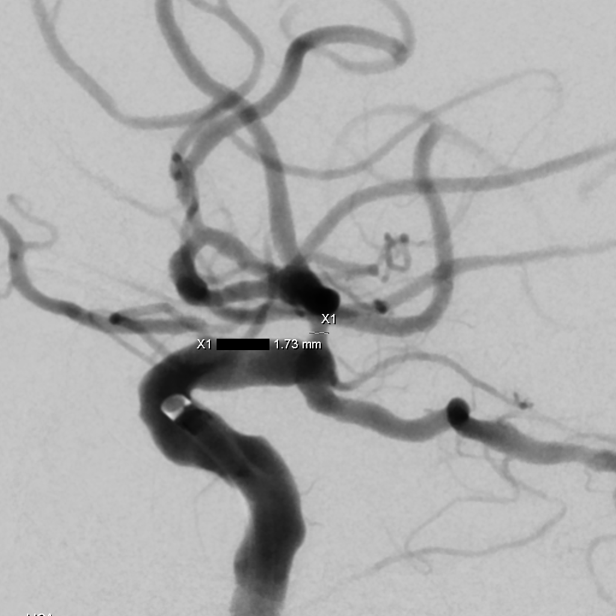
[im 29/29]
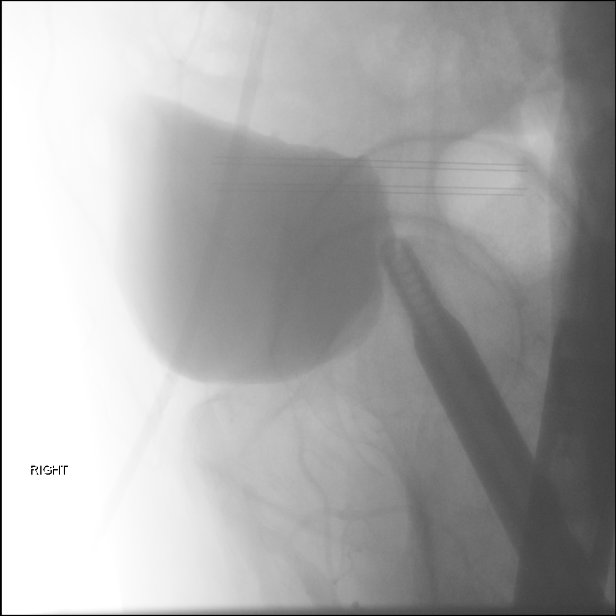

[12 of 24 positions shown; findings below may reference images not displayed]

MEDICATIONS:
650 mg of aspirin administered via OG tube.

ANESTHESIA/SEDATION:
The procedure was performed under general anesthesia.

CONTRAST:  120 mL of Omnipaque 300 milligram/mL.

FLUOROSCOPY:
Radiation Exposure Index (as provided by the fluoroscopic device):
805.5 mGy Kerma

COMPLICATIONS:
None immediate.
Maximal Sterile Barrier Technique was utilized including caps, mask,
sterile gowns, sterile gloves, sterile drape, hand hygiene and skin
antiseptic. A timeout was performed prior to the initiation of the
procedure.

The right groin was prepped and draped in the usual sterile fashion.
Using a micropuncture kit and the modified Seldinger technique,
access was gained to the right common femoral artery and an 8 French
sheath was placed. Real-time ultrasound guidance was utilized for
vascular access including the acquisition of a permanent ultrasound
image documenting patency of the accessed vessel.

Under fluoroscopy, a Zoom 88 guide catheter was navigated over a 6
NOKMAN 2 catheter and a 0.035" Terumo Glidewire into the
aortic arch. The catheter was placed into the left common carotid
artery and then advanced into the left internal carotid artery. The
diagnostic catheter was removed. Frontal and lateral angiograms of
the head were obtained.
FINDINGS: 1. Normal caliber of the right common femoral artery, adequate for
vascular access.
2. Near occlusive stenosis of the left ICA terminus with filling
defect within the terminus extending into the proximal left M1/MCA,
consistent with thrombus. Significant delay in contrast
opacification of the left ICA and ACA vascular tree.
3. Left fetal PCA with retrograde filling of the left pericallosal
artery via posterior pericallosal leptomeningeal collaterals. There
is also retrograde filling of the left MCA posterior division branch
via leptomeningeal collaterals from the left PCA.
4. Diffuse intracranial atherosclerotic disease with multifocal
areas of mild stenosis.

PROCEDURE:
Using biplane roadmap, a Zoom 55 aspiration catheter was navigated
over an Aristotle 24 microguidewire into the cavernous segment of
the left ICA. The aspiration catheter was then advanced to the level
of occlusion and connected to an aspiration pump. Continuous
aspiration was performed for 2 minutes. The guide catheter was
connected to a VacLok syringe. The aspiration catheter was
subsequently removed under constant aspiration. The guide catheter
was aspirated for debris.

Left internal carotid artery angiograms showed recanalization of the
left ICA terminus with brisk contrast opacification of the left ACA
and MCA vascular tree. The ICA terminus stenosis has improved likely
related to mild angioplasty from passage of the aspiration catheter.
However, delayed angiograms showed high-grade restenoses of the ICA
terminus.

Then, a 3 x 15 mm apex balloon was navigated over an Aristotle 14
micro guidewire into the left ICA terminus under fluoroscopic
guidance. Angioplasty was performed under fluoroscopy.

Left internal carotid artery angiograms with magnified left anterior
oblique and lateral views of the head showed significant improvement
of the degree of stenosis with brisk anterograde contrast
opacification of the left ACA and MCA vascular tree.

This point, 650 mg of aspirin were administered via OG tube.

Multiple delay angiograms of the left ICA with frontal and lateral
views showed stable patency of the left ICA terminus with brisk
contrast opacification of the left anterior circulation.

Flat panel CT of the head was obtained and post processed in a
separate workstation with concurrent attending physician
supervision. Selected images were sent to PACS. No evidence of
hemorrhagic complication.

Right common femoral artery angiogram was obtained in right anterior
oblique and lateral views. The puncture is at the level of the
common femoral artery. The artery has mild atherosclerotic changes
without significant stenosis, adequate for closure device. The
sheath was exchanged over the wire for an 8 French Angio-Seal which
was utilized for access closure. Immediate hemostasis was achieved.
IMPRESSION: Successful mechanical thrombectomy followed by angioplasty for
treatment of near occlusive the stenosis and thrombus in the left
ICA terminus with complete recanalization and brisk left anterior
circulation anterograde flow. No evidence of thromboembolic or
hemorrhagic complication.

PLAN:
Transfer to ICU for continued care.

Continue ASA 325 mg q.d.

## 2022-01-15 SURGERY — RADIOLOGY WITH ANESTHESIA
Anesthesia: General

## 2022-01-15 MED ORDER — PROPOFOL 10 MG/ML IV BOLUS
INTRAVENOUS | Status: DC | PRN
  Administered 2022-01-15: 100 mg via INTRAVENOUS

## 2022-01-15 MED ORDER — GLYCOPYRROLATE 0.2 MG/ML IJ SOLN
INTRAMUSCULAR | Status: DC | PRN
  Administered 2022-01-15: .2 mg via INTRAVENOUS

## 2022-01-15 MED ORDER — ASPIRIN 325 MG PO TABS
ORAL_TABLET | ORAL | Status: AC
  Filled 2022-01-15: qty 2

## 2022-01-15 MED ORDER — CLEVIDIPINE BUTYRATE 0.5 MG/ML IV EMUL
0.0000 mg/h | INTRAVENOUS | Status: DC
  Filled 2022-01-15: qty 50

## 2022-01-15 MED ORDER — ROCURONIUM BROMIDE 10 MG/ML (PF) SYRINGE
PREFILLED_SYRINGE | INTRAVENOUS | Status: DC | PRN
  Administered 2022-01-15: 50 mg via INTRAVENOUS

## 2022-01-15 MED ORDER — INSULIN ASPART 100 UNIT/ML IJ SOLN
0.0000 [IU] | INTRAMUSCULAR | Status: DC
  Administered 2022-01-16 (×2): 1 [IU] via SUBCUTANEOUS
  Administered 2022-01-16: 2 [IU] via SUBCUTANEOUS
  Administered 2022-01-16: 1 [IU] via SUBCUTANEOUS

## 2022-01-15 MED ORDER — PHENYLEPHRINE HCL-NACL 20-0.9 MG/250ML-% IV SOLN
INTRAVENOUS | Status: DC | PRN
  Administered 2022-01-15: 50 ug/min via INTRAVENOUS

## 2022-01-15 MED ORDER — ASPIRIN 81 MG PO CHEW: 324.0000 mg | CHEWABLE_TABLET | Freq: Every day | ORAL | Status: DC

## 2022-01-15 MED ORDER — PANTOPRAZOLE SODIUM 40 MG IV SOLR: 40.0000 mg | Freq: Every day | INTRAVENOUS | Status: DC

## 2022-01-15 MED ORDER — CLEVIDIPINE BUTYRATE 0.5 MG/ML IV EMUL
0.0000 mg/h | INTRAVENOUS | Status: DC
  Administered 2022-01-15: 8 mg/h via INTRAVENOUS

## 2022-01-15 MED ORDER — IOHEXOL 350 MG/ML SOLN
100.0000 mL | Freq: Once | INTRAVENOUS | Status: AC | PRN
  Administered 2022-01-15: 100 mL via INTRAVENOUS

## 2022-01-15 MED ORDER — ACETAMINOPHEN 160 MG/5ML PO SOLN: 650.0000 mg | ORAL | Status: DC | PRN

## 2022-01-15 MED ORDER — CLEVIDIPINE BUTYRATE 0.5 MG/ML IV EMUL
INTRAVENOUS | Status: AC
  Filled 2022-01-15: qty 100

## 2022-01-15 MED ORDER — FENTANYL CITRATE (PF) 100 MCG/2ML IJ SOLN
INTRAMUSCULAR | Status: AC
  Filled 2022-01-15: qty 2

## 2022-01-15 MED ORDER — SUGAMMADEX SODIUM 200 MG/2ML IV SOLN
INTRAVENOUS | Status: DC | PRN
  Administered 2022-01-15: 200 mg via INTRAVENOUS

## 2022-01-15 MED ORDER — IOHEXOL 300 MG/ML  SOLN
100.0000 mL | Freq: Once | INTRAMUSCULAR | Status: AC | PRN
  Administered 2022-01-15: 60 mL via INTRA_ARTERIAL

## 2022-01-15 MED ORDER — SENNOSIDES-DOCUSATE SODIUM 8.6-50 MG PO TABS: 1.0000 | ORAL_TABLET | Freq: Every evening | ORAL | Status: DC | PRN

## 2022-01-15 MED ORDER — SODIUM CHLORIDE 0.9 % IV SOLN: INTRAVENOUS | Status: DC | PRN

## 2022-01-15 MED ORDER — STROKE: EARLY STAGES OF RECOVERY BOOK: Freq: Once | Status: AC

## 2022-01-15 MED ORDER — LABETALOL HCL 5 MG/ML IV SOLN
5.0000 mg | INTRAVENOUS | Status: DC | PRN
  Administered 2022-01-16 – 2022-01-17 (×2): 5 mg via INTRAVENOUS
  Filled 2022-01-15 (×2): qty 4

## 2022-01-15 MED ORDER — LIDOCAINE 2% (20 MG/ML) 5 ML SYRINGE
INTRAMUSCULAR | Status: DC | PRN
  Administered 2022-01-15: 60 mg via INTRAVENOUS

## 2022-01-15 MED ORDER — ASPIRIN 325 MG PO TABS: 325.0000 mg | ORAL_TABLET | Freq: Every day | ORAL | Status: DC

## 2022-01-15 MED ORDER — ASPIRIN 325 MG PO TABS
ORAL_TABLET | ORAL | Status: AC | PRN
  Administered 2022-01-15: 650 mg

## 2022-01-15 MED ORDER — SUCCINYLCHOLINE CHLORIDE 200 MG/10ML IV SOSY
PREFILLED_SYRINGE | INTRAVENOUS | Status: DC | PRN
  Administered 2022-01-15: 120 mg via INTRAVENOUS

## 2022-01-15 MED ORDER — ACETAMINOPHEN 650 MG RE SUPP: 650.0000 mg | RECTAL | Status: DC | PRN

## 2022-01-15 MED ORDER — CLEVIDIPINE BUTYRATE 0.5 MG/ML IV EMUL
INTRAVENOUS | Status: AC
  Filled 2022-01-15: qty 50

## 2022-01-15 MED ORDER — ONDANSETRON HCL 4 MG/2ML IJ SOLN
INTRAMUSCULAR | Status: DC | PRN
  Administered 2022-01-15: 4 mg via INTRAVENOUS

## 2022-01-15 MED ORDER — DEXAMETHASONE SODIUM PHOSPHATE 10 MG/ML IJ SOLN
INTRAMUSCULAR | Status: DC | PRN
  Administered 2022-01-15: 10 mg via INTRAVENOUS

## 2022-01-15 MED ORDER — FENTANYL CITRATE (PF) 100 MCG/2ML IJ SOLN
INTRAMUSCULAR | Status: DC | PRN
  Administered 2022-01-15: 100 ug via INTRAVENOUS

## 2022-01-15 MED ORDER — SODIUM CHLORIDE 0.9 % IV SOLN: INTRAVENOUS | Status: DC

## 2022-01-15 MED ORDER — ACETAMINOPHEN 325 MG PO TABS
650.0000 mg | ORAL_TABLET | ORAL | Status: DC | PRN
  Administered 2022-01-20 (×2): 650 mg via ORAL
  Filled 2022-01-15 (×2): qty 2

## 2022-01-15 MED ORDER — CHLORHEXIDINE GLUCONATE CLOTH 2 % EX PADS
6.0000 | MEDICATED_PAD | Freq: Every day | CUTANEOUS | Status: DC
  Administered 2022-01-17 – 2022-01-21 (×5): 6 via TOPICAL

## 2022-01-15 NOTE — ED Provider Notes (Signed)
[facility] EMERGENCY DEPARTMENT Provider Note   CSN: [id number] Arrival date & time: 01/15/22  1900  An emergency department physician performed an initial assessment on this suspected stroke patient at 1901.  History  Chief Complaint  Patient presents with   Code Stroke    Elek Holderness is a 71 y.o. male.  Patient is a 71 year old male with a history of GERD who lives alone and able to care for himself without difficulty presenting today as a code stroke.  Patient was last seen normal by family at 9 AM today.  His sister came over to see him this evening and found him lying on the floor with weakness in his left arm and leg, staring to the left and not responding to voice.  When EMS arrived patient was hypertensive, gaze preference and flaccid right-sided findings.  He is LVO positive and a code stroke was initiated.  Here patient is not able to answer any questions and does not follow many commands.  He was lying on the floor and there is assumption that he fell.  No reported blood thinners.  Blood sugar was 101.  The history is provided by the EMS personnel and medical records.      Home Medications Prior to Admission medications   Medication Sig Start Date End Date Taking? Authorizing Provider  docusate sodium (COLACE) 100 MG capsule Take 1 capsule (100 mg total) by mouth 2 (two) times daily. Patient not taking: Reported on 11/14/2020 10/19/20   Evon Slack, PA-C  enoxaparin (LOVENOX) 40 MG/0.4ML injection Inject 0.4 mLs (40 mg total) into the skin daily for 14 days. 10/19/20 11/02/20  Evon Slack, PA-C  HYDROcodone-acetaminophen (NORCO/VICODIN) 5-325 MG tablet Take 1 tablet by mouth every 4 (four) hours as needed for moderate pain (pain score 4-6). Patient not taking: Reported on 11/14/2020 10/19/20   Evon Slack, PA-C  omeprazole (PRILOSEC OTC) 20 MG tablet Take 20 mg by mouth daily. Patient not taking: Reported on 11/14/2020    [provider]   senna-docusate (SENOKOT-S) 8.6-50 MG tablet Take 1 tablet by mouth 2 (two) times daily between meals as needed for mild constipation. Patient not taking: Reported on 11/14/2020 10/22/20   Almon Hercules, MD  traZODone (DESYREL) 50 MG tablet Take 1 tablet (50 mg total) by mouth at bedtime. Patient not taking: Reported on 11/14/2020 10/22/20   Almon Hercules, MD  Vitamin D, Ergocalciferol, (DRISDOL) 1.25 MG (50000 UNIT) CAPS capsule Take 1 capsule (50,000 Units total) by mouth every 7 (seven) days. Patient not taking: Reported on 11/14/2020 10/26/20   Almon Hercules, MD      Allergies    Penicillins    Review of Systems   Review of Systems  Physical Exam Updated Vital Signs BP (!) 179/109 (BP Location: Right Arm)   Pulse (!) 119   Temp 97.7 F (36.5 C) (Oral)   Resp (!) 22   Ht  (1.778 m)   Wt 82.4 kg   SpO2 99%   BMI 26.07 kg/m  Physical Exam Vitals and nursing note reviewed.  Constitutional:      General: He is not in acute distress.    Appearance: He is well-developed. He is ill-appearing.  HENT:     Head: Normocephalic and atraumatic.  Eyes:     Conjunctiva/sclera: Conjunctivae normal.     Pupils: Pupils are equal, round, and reactive to light.  Cardiovascular:     Rate and Rhythm: Normal rate and regular  rhythm.     Heart sounds: No murmur heard. Pulmonary:     Effort: Pulmonary effort is normal. No respiratory distress.     Breath sounds: Normal breath sounds. No wheezing or rales.  Abdominal:     General: There is no distension.     Palpations: Abdomen is soft.     Tenderness: There is no abdominal tenderness. There is no guarding or rebound.  Musculoskeletal:        General: Normal range of motion.     Cervical back: Normal range of motion and neck supple.     Right lower leg: No edema.     Left lower leg: No edema.     Comments: Abrasions noted to bilateral knees  Skin:    General: Skin is warm and dry.     Findings: No erythema or rash.  Neurological:      Mental Status: He is alert.     Cranial Nerves: Facial asymmetry present.     Motor: Weakness present.     Comments: Patient has flaccid paralysis in the right arm and leg, right-sided facial droop, left gaze preference and hemineglect.  Patient not answering any questions.    ED Results / Procedures / Treatments   Labs (all labs ordered are listed, but only abnormal results are displayed) Labs Reviewed  CBC - Abnormal; Notable for the following components:      Result Value   WBC 16.3 (*)    All other components within normal limits  DIFFERENTIAL - Abnormal; Notable for the following components:   Neutro Abs 13.9 (*)    Monocytes Absolute 1.1 (*)    Abs Immature Granulocytes 0.08 (*)    All other components within normal limits  COMPREHENSIVE METABOLIC PANEL - Abnormal; Notable for the following components:   Chloride 112 (*)    CO2 16 (*)    Glucose, Bld 102 (*)    BUN 28 (*)    Total Bilirubin 1.7 (*)    All other components within normal limits  URINALYSIS, ROUTINE W REFLEX MICROSCOPIC - Abnormal; Notable for the following components:   APPearance HAZY (*)    Specific Gravity, Urine 1.039 (*)    Hgb urine dipstick SMALL (*)    Ketones, ur 80 (*)    Bacteria, UA FEW (*)    All other components within normal limits  CBG MONITORING, ED - Abnormal; Notable for the following components:   Glucose-Capillary 102 (*)    All other components within normal limits  I-STAT CHEM 8, ED - Abnormal; Notable for the following components:   Chloride 112 (*)    BUN 34 (*)    Glucose, Bld 106 (*)    Calcium, Ion 1.06 (*)    TCO2 18 (*)    All other components within normal limits  RESP PANEL BY RT-PCR (FLU A&B, COVID) ARPGX2  ETHANOL  PROTIME-INR  APTT  RAPID URINE DRUG SCREEN, HOSP PERFORMED    EKG EKG Interpretation  Date/Time:  Wednesday Jan 15 2022 19:33:28 EDT Ventricular Rate:  118 PR Interval:  156 QRS Duration: 114 QT Interval:  336 QTC Calculation: 471 R  Axis:   -48 Text Interpretation: Sinus or ectopic atrial tachycardia Borderline IVCD with LAD Inferior infarct, old Consider anterior infarct Lateral leads are also involved Confirmed by Gwyneth Sprout ([id number]) on 01/15/2022 8:28:28 PM  Radiology MR BRAIN WO CONTRAST  Result Date: 01/15/2022 CLINICAL DATA:  Initial evaluation for acute neuro deficit, stroke. EXAM: MRI HEAD WITHOUT CONTRAST TECHNIQUE:  Multiplanar, multiecho pulse sequences of the brain and surrounding structures were obtained without intravenous contrast. COMPARISON:  Prior CTs from earlier the same day. FINDINGS: Brain: Examination degraded by motion artifact. Generalized age-related cerebral atrophy. Moderately advanced chronic microvascular ischemic disease seen involving the supratentorial cerebral white matter. Multiple scatter remote cortical to subcortical infarcts noted involving the right frontal and parietal lobes, left parietal lobe, and right occipital lobe. Small remote right cerebellar infarct. Multiple remote lacunar infarcts present about the deep gray nuclei/corona radiata. Mild scattered chronic hemosiderin staining noted about a few of these chronic infarcts. Patchy restricted diffusion involving the cortical and subcortical aspect of the left frontal, parietal, and temporal lobes, consistent with acute left MCA distribution infarct. 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 No associated hemorrhage or mass effect. No other evidence for acute or subacute ischemia. No acute intracranial hemorrhage. No mass lesion, mass effect or midline shift. Previously noted finding at the fourth ventricular outflow tract on prior CT most likely reflects cord plexus. No hydrocephalus or extra-axial fluid collection. Pituitary gland suprasellar region within normal limits. Vascular: Major intracranial vascular flow voids are maintained. Skull  and upper cervical spine: Craniocervical junction within normal limits. Bone marrow signal intensity normal. No scalp soft tissue abnormality. Sinuses/Orbits: Globes and orbital soft tissues demonstrate no acute finding. Scattered mucosal thickening noted throughout the paranasal sinuses. Trace right mastoid effusion, of doubtful significance. Other: None. IMPRESSION: 1. 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 No associated hemorrhage or significant mass effect. 2. No other acute intracranial abnormality. 3. Underlying age-related cerebral atrophy with extensive chronic ischemic changes as above. Electronically Signed   By: Rise Mu M.D.   On: 01/15/2022 21:07   CT C-SPINE NO CHARGE  Result Date: 01/15/2022 CLINICAL DATA:  Initial evaluation for acute stroke. EXAM: CT ANGIOGRAPHY HEAD AND NECK CT PERFUSION BRAIN CT CERVICAL SPINE WITHOUT CONTRAST TECHNIQUE: Multidetector CT imaging of the head and neck was performed using the standard protocol during bolus administration of intravenous contrast. Multiplanar CT image reconstructions and MIPs were obtained to evaluate the vascular anatomy. Carotid stenosis measurements (when applicable) are obtained utilizing NASCET criteria, using the distal internal carotid diameter as the denominator. Multiphase CT imaging of the brain was performed following IV bolus contrast injection. Subsequent parametric perfusion maps were calculated using RAPID software. RADIATION DOSE REDUCTION: This exam was performed according to the departmental dose-optimization program which includes automated exposure control, adjustment of the mA and/or kV according to patient size and/or use of iterative reconstruction technique. CONTRAST:  OMNIPAQUE IOHEXOL 350 MG/ML SOLN COMPARISON:  Prior CT from earlier the same  day. FINDINGS: CTA NECK FINDINGS Aortic arch: Visualized aortic arch normal caliber with standard branching pattern. Moderately advanced atheromatous change about the arch and origin of the great vessels with associated intimal irregularity. Small amount of thrombus protrudes into the lumen of the proximal left subclavian artery (series 8, image 327). No high-grade stenosis about the origin the great vessels. Right carotid system: Right common and internal carotid arteries are tortuous. Prominent mixed plaque about the right carotid bulb/proximal right ICA without hemodynamically significant greater than 50% stenosis. Right ICA patent distally without stenosis or dissection. Left carotid system: Left common and internal carotid arteries are tortuous. Prominent mixed plaque about the left carotid bulb/proximal left ICA without hemodynamically  significant stenosis. Left ICA patent distally without stenosis or dissection. Vertebral arteries: Both vertebral arteries arise from the subclavian arteries. Left vertebral artery dominant. No significant proximal subclavian artery stenosis. Vertebral arteries tortuous but patent without stenosis or dissection. Skeleton: No acute fracture within the cervical spine. No discrete or worrisome osseous lesions. Mild degenerative spondylosis present at C5-6 and C6-7. Other neck: No other acute soft tissue abnormality within the neck. Upper chest: Visualized upper chest demonstrates no acute finding. Centrilobular emphysema noted. Review of the MIP images confirms the above findings CTA HEAD FINDINGS Anterior circulation: Petrous segments patent bilaterally. Atheromatous change within the carotid siphons without hemodynamically significant stenosis. 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 A radiographic string sign is present. While this finding is somewhat age indeterminate, subtle surrounding soft tissue attenuation suggest at there  is likely an acute component. Left M1 segment patent distally. No visible downstream left MCA branch occlusion. Right M1 segment and distal right MCA branches are widely patent. Posterior circulation: Atheromatous irregularity within the dominant left V4 segment without significant stenosis. Left PICA patent. Right vertebral artery largely terminates in PICA. Right PICA patent as well. Hypoplastic right V4 segment largely occludes beyond the takeoff of the right PICA. Basilar irregular and somewhat diminutive but patent to its distal aspect. Superior cerebral arteries patent bilaterally. Predominant fetal type origin of the PCAs. Both PCAs irregular but patent to their distal aspects without high-grade stenosis. Venous sinuses: Grossly patent allowing for timing the contrast bolus. Anatomic variants: As above.  No aneurysm. Review of the MIP images confirms the above findings CT Brain Perfusion Findings: ASPECTS: 10. CBF (<30%) Volume: 0mL Perfusion (Tmax>6.0s) volume: Mismatch Volume: Infarction Location:No evidence for acute core infarct by CT perfusion. Large 190 cc perfusion deficit within the left MCA distribution related to the above described severe stenosis/near occlusion at the left ICA terminus. IMPRESSION: 1. 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 While this finding is somewhat age indeterminate, subtle surrounding soft tissue attenuation suggests that there is likely an acute component. No visible downstream left MCA embolic occlusion. 2. 190 mL perfusion deficit within the left MCA distribution related to the above described severe stenosis/near occlusion at the left ICA terminus. No acute core infarct by CT perfusion. 3. Moderate atheromatous disease elsewhere about the major arterial vasculature of the head and neck. No other proximal high-grade or correctable stenosis. 4. No acute traumatic injury within the cervical spine. 5.  Aortic Atherosclerosis (ICD10-I70.0) and Emphysema (ICD10-J43.9). Critical Value/emergent results were called by telephone at the time of interpretation on 01/15/2022 at 7:40 p.m. to provider Dr. Iver Nestle, who verbally acknowledged these results. Electronically Signed   By: Rise Mu M.D.   On: 01/15/2022 20:36   CT HEAD CODE STROKE WO CONTRAST  Result Date: 01/15/2022 CLINICAL DATA:  Code stroke. EXAM: CT HEAD WITHOUT CONTRAST TECHNIQUE: Contiguous axial images were obtained from the base of the skull through the vertex without intravenous contrast. RADIATION DOSE REDUCTION: This exam was performed according to the departmental dose-optimization program which includes automated exposure control, adjustment of the mA and/or kV according to patient size and/or use of iterative reconstruction technique. COMPARISON:  None available. FINDINGS: Brain: Generalized age-related cerebral atrophy with moderately advanced chronic microvascular ischemic disease. Several remote lacunar infarcts present about the bilateral basal ganglia and thalami. Multifocal areas of encephalomalacia involving the right parietal and occipital lobes as well as  the left temporal occipital region, consistent with chronic ischemic infarcts. Small remote right cerebellar infarct. No acute intracranial hemorrhage. Note made of a 7 mm partially calcified lesion at the fourth ventricular outflow tract (series 2, image 7), indeterminate, but favored to be vascular in nature, possibly reflecting aneurysm. No other mass lesion, mass effect or midline shift. No hydrocephalus or extra-axial fluid collection. Vascular: No hyperdense vessel. 7 mm fourth ventricular lesion as above. Calcified atherosclerosis present at skull base. Skull: Scalp soft tissues and calvarium within normal limits. Sinuses/Orbits: Left gaze noted. Scattered mucosal thickening noted about the ethmoidal air cells and maxillary sinuses. Mastoid air cells are clear. Other:  None. ASPECTS [facility]([place] Stroke Program Early CT Score) - Ganglionic level infarction (caudate, lentiform nuclei, internal capsule, insula, M1-M3 cortex): 7 - Supraganglionic infarction (M4-M6 cortex): 3 Total score (0-10 with 10 being normal): 10 IMPRESSION: 1. No acute intracranial abnormality. 2. ASPECTS is 10. 3. Atrophy with moderate chronic small vessel ischemic disease with multiple remote ischemic infarcts as above. 4. Approximate 7 mm lesion at the level of the fourth ventricular outflow tract, indeterminate, but favored to be vascular in nature, potentially reflecting aneurysm. These results were communicated to Dr. Iver NestleBhagat at 7:19 pm on 01/15/2022 by text page via the [facility] messaging system. Electronically Signed   By: Rise MuBenjamin  McClintock M.D.   On: 01/15/2022 19:23   CT ANGIO HEAD NECK W WO CM W PERF (CODE STROKE)  Result Date: 01/15/2022 CLINICAL DATA:  Initial evaluation for acute stroke. EXAM: CT ANGIOGRAPHY HEAD AND NECK CT PERFUSION BRAIN CT CERVICAL SPINE WITHOUT CONTRAST TECHNIQUE: Multidetector CT imaging of the head and neck was performed using the standard protocol during bolus administration of intravenous contrast. Multiplanar CT image reconstructions and MIPs were obtained to evaluate the vascular anatomy. Carotid stenosis measurements (when applicable) are obtained utilizing NASCET criteria, using the distal internal carotid diameter as the denominator. Multiphase CT imaging of the brain was performed following IV bolus contrast injection. Subsequent parametric perfusion maps were calculated using RAPID software. RADIATION DOSE REDUCTION: This exam was performed according to the departmental dose-optimization program which includes automated exposure control, adjustment of the mA and/or kV according to patient size and/or use of iterative reconstruction technique. CONTRAST:  100mL OMNIPAQUE IOHEXOL 350 MG/ML SOLN COMPARISON:  Prior CT from earlier the same day. FINDINGS: CTA NECK FINDINGS  Aortic arch: Visualized aortic arch normal caliber with standard branching pattern. Moderately advanced atheromatous change about the arch and origin of the great vessels with associated intimal irregularity. Small amount of thrombus protrudes into the lumen of the proximal left subclavian artery (series 8, image 327). No high-grade stenosis about the origin the great vessels. Right carotid system: Right common and internal carotid arteries are tortuous. Prominent mixed plaque about the right carotid bulb/proximal right ICA without hemodynamically significant greater than 50% stenosis. Right ICA patent distally without stenosis or dissection. Left carotid system: Left common and internal carotid arteries are tortuous. Prominent mixed plaque about the left carotid bulb/proximal left ICA without hemodynamically significant stenosis. Left ICA patent distally without stenosis or dissection. Vertebral arteries: Both vertebral arteries arise from the subclavian arteries. Left vertebral artery dominant. No significant proximal subclavian artery stenosis. Vertebral arteries tortuous but patent without stenosis or dissection. Skeleton: No acute fracture within the cervical spine. No discrete or worrisome osseous lesions. Mild degenerative spondylosis present at C5-6 and C6-7. Other neck: No other acute soft tissue abnormality within the neck. Upper chest: Visualized upper chest demonstrates no acute finding. Centrilobular emphysema  noted. Review of the MIP images confirms the above findings CTA HEAD FINDINGS Anterior circulation: Petrous segments patent bilaterally. Atheromatous change within the carotid siphons without hemodynamically significant stenosis. 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 A radiographic string sign is present. While this finding is somewhat age indeterminate, subtle surrounding soft tissue attenuation suggest at there is likely an acute component.  Left M1 segment patent distally. No visible downstream left MCA branch occlusion. Right M1 segment and distal right MCA branches are widely patent. Posterior circulation: Atheromatous irregularity within the dominant left V4 segment without significant stenosis. Left PICA patent. Right vertebral artery largely terminates in PICA. Right PICA patent as well. Hypoplastic right V4 segment largely occludes beyond the takeoff of the right PICA. Basilar irregular and somewhat diminutive but patent to its distal aspect. Superior cerebral arteries patent bilaterally. Predominant fetal type origin of the PCAs. Both PCAs irregular but patent to their distal aspects without high-grade stenosis. Venous sinuses: Grossly patent allowing for timing the contrast bolus. Anatomic variants: As above.  No aneurysm. Review of the MIP images confirms the above findings CT Brain Perfusion Findings: ASPECTS: 10. CBF (<30%) Volume: 19mL Perfusion (Tmax>6.0s) volume: Mismatch Volume: Infarction Location:No evidence for acute core infarct by CT perfusion. Large 190 cc perfusion deficit within the left MCA distribution related to the above described severe stenosis/near occlusion at the left ICA terminus. IMPRESSION: 1. 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 While this finding is somewhat age indeterminate, subtle surrounding soft tissue attenuation suggests that there is likely an acute component. No visible downstream left MCA embolic occlusion. 2. 190 mL perfusion deficit within the left MCA distribution related to the above described severe stenosis/near occlusion at the left ICA terminus. No acute core infarct by CT perfusion. 3. Moderate atheromatous disease elsewhere about the major arterial vasculature of the head and neck. No other proximal high-grade or correctable stenosis. 4. No acute traumatic injury within the cervical spine. 5. Aortic Atherosclerosis  (ICD10-I70.0) and Emphysema (ICD10-J43.9). Critical Value/emergent results were called by telephone at the time of interpretation on 01/15/2022 at 7:40 p.m. to provider Dr. Iver Nestle, who verbally acknowledged these results. Electronically Signed   By: Rise Mu M.D.   On: 01/15/2022 20:36    Procedures Procedures    Medications Ordered in ED Medications  clevidipine (CLEVIPREX) 0.5 MG/ML infusion (has no administration in time range)  iohexol (OMNIPAQUE) 300 MG/ML solution 100 mL (has no administration in time range)  iohexol (OMNIPAQUE) 350 MG/ML injection 100 mL (100 mLs Intravenous Contrast Given 01/15/22 1940)  fentaNYL (SUBLIMAZE) 100 MCG/2ML injection (  Override pull for Anesthesia 01/15/22 2051)    ED Course/ Medical Decision Making/ A&P                           Medical Decision Making Amount and/or Complexity of Data Reviewed Independent Historian: EMS External Data Reviewed: notes.    Details: prior hospitalization and pcp Labs: ordered. Decision-making details documented in ED Course. Radiology: ordered and independent interpretation performed. Decision-making details documented in ED Course. ECG/medicine tests: ordered and independent interpretation performed. Decision-making details documented in ED Course.  Risk Decision regarding hospitalization.   Pt presenting today with a complaint that caries a high risk for morbidity and mortality.  As a code stroke today LVO positive with hemineglect and right-sided paralysis with a high NIH score.  Patient does seem to have some response to pain and lower suspicion for seizure activity.  Concern for intracranial bleed versus ischemic stroke.  Blood sugars within normal limits.  Labs and imaging are pending.  Neurology present upon patient's arrival.  Airway clear and he went directly to CT.  No reported history of being on any anticoagulants.  Patient did fall and was found on the floor by his sister.  Will ensure no  evidence of spinal fracture.  He has some abrasions on the knees but no deformity or concern for fracture.  9:20 PM On further review with patient's sister now it is unclear when he was last normal.  He was walking still at 5 PM but had been having episodes over the last few days where he seemed confused or not quite himself.  I independently interpreted patient's labs and EKG.  EKG was sinus tachycardia without acute ACS.  Labs with normal blood sugar, renal function and coags.  CBC with a white count of 16,000 and UA without evidence of UTI. I have independently visualized and interpreted pt's images today.  CT head without evidence of intracranial hemorrhage.  Per radiology CTA of the head did show some narrowing and possible obstruction.  CT perfusion was done given patient's significant deficits and unclear last seen normal time.  Patient does show some area in the watershed but still shows a lot of viable brain tissue.  MRI shows patchy acute ischemic nonhemorrhagic left MCA distribution infarct in the watershed distribution.  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  Given these findings with out significant ischemia patient was taken to IR.  CRITICAL CARE Performed by: Seamus Warehime Total critical care time: 30 minutes Critical care time was exclusive of separately billable procedures and treating other patients. Critical care was necessary to treat or prevent imminent or life-threatening deterioration. Critical care was time spent personally by me on the following activities: development of treatment plan with patient and/or surrogate as well as nursing, discussions with consultants, evaluation of patient's response to treatment, examination of patient, obtaining history from patient or surrogate, ordering and performing treatments and interventions, ordering and review of laboratory studies, ordering and review of radiographic studies, pulse oximetry and re-evaluation of  patient's condition.         Final Clinical Impression(s) / ED Diagnoses Final diagnoses:  Acute ischemic stroke [facility])    Rx / DC Orders ED Discharge Orders     None         Gwyneth Sprout, MD 01/15/22 2121

## 2022-01-15 NOTE — ED Notes (Signed)
Patient transported to CT 

## 2022-01-15 NOTE — ED Notes (Signed)
To CT

## 2022-01-15 NOTE — Transfer of Care (Signed)
Immediate Anesthesia Transfer of Care Note  Patient: Cleven Jansma  Procedure(s) Performed: RADIOLOGY WITH ANESTHESIA  Patient Location: NICU  Anesthesia Type:General  Level of Consciousness: responds to stimulation  Airway & Oxygen Therapy: Patient connected to face mask oxygen  Post-op Assessment: Report given to RN and Post -op Vital signs reviewed and stable  Post vital signs: Reviewed and stable  Last Vitals:  Vitals Value Taken Time  BP 145/93 01/15/22 2256  Temp 36.1 C 01/15/22 2256  Pulse 103 01/15/22 2258  Resp 20 01/15/22 2258  SpO2 98 % 01/15/22 2258  Vitals shown include unvalidated device data.  Last Pain:  Vitals:   01/15/22 2256  TempSrc: Axillary         Complications: No notable events documented.

## 2022-01-15 NOTE — ED Notes (Addendum)
MD Bhagat at bedside discussing future plan of care with patient's point of contact while patient is in MRI. Pending transport to ED room/IR upon decision for medical treatment.

## 2022-01-15 NOTE — Code Documentation (Signed)
Responded to Code Stroke called at 1839 for R sided weakness, LSN originally thought to be 1100. Pt arrived at 1900, CBG-102, NIH-26. After speaking with family, LSN changed to 3 days ago. CT head-multiple remote ischemic infarcts, no hemorrhage. TNK not given-outside window. CTA-sever stenosis versus occlusion of distal L ICA, CTP-190cc perfusion deficit, 0 core infarct. Pt taken to MRI at 1950 to confirm. MRI-patchy small watershed distribution stroke in L MCA territory. Decision made to take pt to IR. IR paged out at 2019 and pt arrived in IR at 2027. Plan admit to ICU post IR.

## 2022-01-15 NOTE — ED Notes (Signed)
Patient transported to MRI 

## 2022-01-15 NOTE — Sedation Documentation (Signed)
Patient transported to [facility] ICU with CRNA. Clovis Riley RN at the bedside to receive patient along with nursing staff. Right groin assessed. Clean, dry and intact. No drainage noted from dressing. Soft to palpation, no hematoma noted. +1 distal pulses intact via palpation. Notable upward gaze, minimal movement to right side. Intact movement on left. No purposeful movement. Not following commands at this time

## 2022-01-15 NOTE — Anesthesia Procedure Notes (Signed)
Arterial Line Insertion Start/End5/31/2023 10:45 AM, 01/15/2022 10:48 AM Performed by: Tressia Miners, CRNA, CRNA  Patient location: Pre-op. Preanesthetic checklist: patient identified, IV checked, site marked, risks and benefits discussed, surgical consent, monitors and equipment checked, pre-op evaluation, timeout performed and anesthesia consent Left, radial was placed Catheter size: 20 G Hand hygiene performed , maximum sterile barriers used  and Seldinger technique used Allen's test indicative of satisfactory collateral circulation Attempts: 1 Procedure performed without using ultrasound guided technique. Following insertion, dressing applied and Biopatch. Post procedure assessment: normal and unchanged

## 2022-01-15 NOTE — ED Triage Notes (Signed)
Pt found on floor by family. Pt presents with R arm and leg weakness. L side deviated gaze. And mute. LKW in unknown- per family who is poor historian- possible 3 days ago LKW.

## 2022-01-15 NOTE — Anesthesia Preprocedure Evaluation (Addendum)
Anesthesia Evaluation  Patient identified by MRN, date of birth, ID band Patient confused    Reviewed: Allergy & Precautions, H&P , NPO status , Patient's Chart, lab work & pertinent test results  Airway Mallampati: III  TM Distance: >3 FB Neck ROM: Full    Dental no notable dental hx. (+) Poor Dentition, Dental Advisory Given   Pulmonary neg pulmonary ROS,    Pulmonary exam normal breath sounds clear to auscultation       Cardiovascular hypertension,  Rhythm:Regular Rate:Tachycardia     Neuro/Psych CVA, Residual Symptoms negative psych ROS   GI/Hepatic Neg liver ROS, hiatal hernia, GERD  Medicated,  Endo/Other  negative endocrine ROS  Renal/GU negative Renal ROS  negative genitourinary   Musculoskeletal   Abdominal   Peds  Hematology  (+) Blood dyscrasia, anemia ,   Anesthesia Other Findings   Reproductive/Obstetrics negative OB ROS                            Anesthesia Physical Anesthesia Plan  ASA: 3 and emergent  Anesthesia Plan: General   Post-op Pain Management:    Induction: Intravenous, Rapid sequence and Cricoid pressure planned  PONV Risk Score and Plan: 3 and Ondansetron, Dexamethasone and Treatment may vary due to age or medical condition  Airway Management Planned: Oral ETT  Additional Equipment: Arterial line  Intra-op Plan:   Post-operative Plan: Possible Post-op intubation/ventilation  Informed Consent: I have reviewed the patients History and Physical, chart, labs and discussed the procedure including the risks, benefits and alternatives for the proposed anesthesia with the patient or authorized representative who has indicated his/her understanding and acceptance.     Dental advisory given  Plan Discussed with: CRNA  Anesthesia Plan Comments:         Anesthesia Quick Evaluation

## 2022-01-15 NOTE — Sedation Documentation (Signed)
8 Fr angioseal deployed right groin ? ?

## 2022-01-15 NOTE — ED Notes (Signed)
Patient transported from MRI to IR by this RN

## 2022-01-15 NOTE — Anesthesia Procedure Notes (Signed)
Procedure Name: Intubation Date/Time: 01/15/2022 8:43 PM Performed by: Molli Hazard, CRNA Pre-anesthesia Checklist: Patient identified, Emergency Drugs available, Suction available and Patient being monitored Patient Re-evaluated:Patient Re-evaluated prior to induction Oxygen Delivery Method: Circle system utilized Preoxygenation: Pre-oxygenation with 100% oxygen Induction Type: IV induction, Rapid sequence and Cricoid Pressure applied Laryngoscope Size: Miller and 2 Grade View: Grade I Tube type: Oral Tube size: 8.0 mm Number of attempts: 1 Airway Equipment and Method: Stylet Placement Confirmation: ETT inserted through vocal cords under direct vision, positive ETCO2 and breath sounds checked- equal and bilateral Secured at: 22 cm Tube secured with: Tape Dental Injury: Teeth and Oropharynx as per pre-operative assessment

## 2022-01-15 NOTE — Procedures (Signed)
INTERVENTIONAL NEURORADIOLOGY BRIEF POSTPROCEDURE NOTE  DIAGNOSTIC CEREBRAL ANGIOGRAM MECHANICAL THROMBECTOMY INTRACRANIAL ANGIOPLASTY FLAT PANEL HEAD CT  Attending: Dr. Pedro Earls  Assistant: None.  Diagnosis: Left ICA occlusion vs severe stenosis.  Access site: RCFA.  Access closure: 25F angioseal.  Anesthesia: GETA.  Medication used: ASA 650mg  via OG tube.  Complications: None.  Estimated blood loss: 50 mL.  Specimen: None.  Findings: Near occlusive stenosis of the left carotid terminus related to both atherosclerotic disease and clot formation. Mechanical thrombectomy performed with direct contact aspiration achieving complete recanalization after one pass (TICI3). Delay angiograms showed recoil of the left ICA terminus with severe stenosis. Angioplasty was then performed with a 3 x 15 mm balloon with significant improvement. Brisk flow to left MCA and ACA territories was noted on multiple delay angiograms. Flat panel CT showed no evidence of hemorrhagic complication.  The patient tolerated the procedure well without incident or complication and is in stable condition.   PLAN: - Bed rest x 6 hours post femoral access.  - SBP 120-140 mmHg. - Transfer to ICU for continued care.

## 2022-01-15 NOTE — H&P (Signed)
Neurology H&P Reason for Consult: Code stroke Requesting Physician: Blanchie Dessert  CC: AMS and right sided weakness   History is obtained from: Azucena Fallen, sister  HPI: Casey Rangel is a 71 y.o. male with a past medical history significant for avoidance of healthcare, left hip fracture 1 year ago, presenting with aphasia and right-sided weakness.  His sister who he lives with reports that 3 days ago he seemed to be more confused than usual, he does have some memory impairment at baseline intermittently.  For example he could not work his TV and asked her to fix it for him when he is usually the one who is better with technology, and she could fix it easily.  When she asked him for help with her computer he used the wrong words that did not make any sense in attempting to help.  However he was still ambulatory and did not seem weak.  This morning at 10 AM was last time she talked to him but she notes he was in and out of his room all day.  At 5 PM he did come out and see that she was cooking dinner but did not speak to him and went back to his room.  Subsequently he was found down, nonverbal with right-sided weakness and EMS was activated  She notes that he has not had any other recent complaints to her, but also reports that 3 years ago he had several episodes of passing out on the porch but came to and refused to seek medical care, and similarly had an episode about 2 years ago where he passed out in the kitchen and again came to and did not seek medical care.  She additionally reports that he was miserable after his left hip surgery, wanting to come home from the skilled nursing facility before he was medically cleared and not taking any medications for hip pain, even Tylenol.  She reports he is independent at baseline and greatly values his independence  On arrival to the ED he was noted to have an exam consistent with a left MCA stroke.  Case discussed with neuroradiology and neuro  interventional radiology. Given the history, with last known well outside of our typical intervention window but very large area at risk on CT perfusion, we proceeded to MRI to clarify risk of intervention  LKW: 3 days PTA tPA given?: No, out of the window IA performed?:  Yes, minimal stroke on MRI brain Premorbid modified rankin scale: 0-1     0 - No symptoms.     1 - No significant disability. Able to carry out all usual activities, despite some symptoms.    ROS: Unable to obtain due to altered mental status.   Past Medical History:  Diagnosis Date   GERD (gastroesophageal reflux disease)    Hiatal hernia    Past Surgical History:  Procedure Laterality Date   INTRAMEDULLARY (IM) NAIL INTERTROCHANTERIC Left 10/17/2020   Procedure: INTRAMEDULLARY (IM) NAIL INTERTROCHANTRIC;  Surgeon: Hessie Knows, MD;  Location: [facility] ORS;  Service: Orthopedics;  Laterality: Left;   T9-T11 fusion in 1990      No family history on file.  Social History:  reports that he quit smoking about 22 years ago. His smoking use included cigarettes. He has a 45.00 pack-year smoking history. He has never used smokeless tobacco. He reports that he does not currently use alcohol. He reports that he does not use drugs.  Exam: Current vital signs: BP (!) 158/110 (BP Location: Right Arm)  Pulse (!) 118   Resp 18   Wt 82.4 kg   SpO2 95%   BMI 26.07 kg/m  Vital signs in last 24 hours: Pulse Rate:  [118] 118 (05/31 1912) Resp:  [18] 18 (05/31 1912) BP: (158)/(110) 158/110 (05/31 1912) SpO2:  [95 %] 95 % (05/31 1912) Weight:  [82.4 kg] 82.4 kg (05/31 1906)   Physical Exam  Constitutional: Appears thin, no acute distress Psych: Affect flat Eyes: No scleral injection HENT: No oropharyngeal obstruction.  MSK: no joint deformities.  Cardiovascular: Normal rate and regular rhythm Respiratory: Effort normal, non-labored breathing GI: Soft.  No distension. There is no tenderness.  Skin: Warm dry and intact  visible skin  Neuro: Mental Status: Awake, tracks examiner briefly, no verbal output, no clear command following Cranial Nerves: II: Visual Fields are notable for a right hemianopia.  Pupils are equal  III,IV, VI: Fixed left gaze deviation, occasionally comes close to midline but not past V: Facial sensation is intact to light eyelash brush VII: Facial movement is notable for right facial droop.  VIII: Appears to orient to voice Remainder unable to assess given mental status Motor: Tone is normal. Bulk is normal.  Right upper extremity withdraws at best versus flexor posturing.  Right lower extremity trace muscle contraction to noxious stimulation.  Maintains left upper extremity antigravity for at least 10 seconds.  Spontaneously moves left lower extremity at least antigravity Sensory: Reduced responsiveness to touch on the right side, but does react to maximal noxious stimulation Plantars: Keeps toes dorsiflexed bilaterally at rest Cerebellar: Unable to assess given mental status Gait:  Deferred   NIHSS total 26 Score breakdown: 2 points for not answering questions, 2 points for not following commands, 2 points for left gaze preference, 2 points for right hemianopia, 2 points for right facial droop, 3 points for right upper extremity weakness, 2 points for left lower extremity weakness, 3 points for right lower extremity weakness, one-point for sensory loss on the right, 3 points for being mute, 2 points for severe dysarthria, 2 points for neglect of the right side Performed at time of patient arrival to ED    I have reviewed labs in epic and the results pertinent to this consultation are:  Basic Metabolic Panel: Recent Labs  Lab 01/15/22 1908 01/15/22 1914  NA 141 143  K 4.1 3.8  CL 112* 112*  CO2 16*  --   GLUCOSE 102* 106*  BUN 28* 34*  CREATININE 0.98 0.80  CALCIUM 9.1  --     CBC: Recent Labs  Lab 01/15/22 1908 01/15/22 1914  WBC 16.3*  --   NEUTROABS 13.9*   --   HGB 16.4 16.3  HCT 49.3 48.0  MCV 94.6  --   PLT 277  --     Coagulation Studies: Recent Labs    01/15/22 1908  LABPROT 14.1  INR 1.1      I have reviewed the images obtained:  Head CT personally reviewed, notable for multiple chronic strokes, age-indeterminate hypodensity in the left parietal region.  Per radiology read aspects of 10, but unclear to me especially in the setting of the stepwise decline described by family  CTA personally reviewed, notable for severe stenosis versus occlusion of the distal left internal carotid artery with apparent reconstitution through circle of Willis  CT perfusion read by rapid AI as 190 mL of tissue in the left MCA territory at risk with no core infarct, but again I query whether there is some core  infarct given hypodensity in the area  MRI brain personally reviewed, patchy small watershed distribution stroke in the left MCA territory   Full radiology reads:  CT head: 1. No acute intracranial abnormality. 2. ASPECTS is 10. 3. Atrophy with moderate chronic small vessel ischemic disease with multiple remote ischemic infarcts as above. 4. Approximate 7 mm lesion at the level of the fourth ventricular outflow tract, indeterminate, but favored to be vascular in nature, potentially reflecting aneurysm.  CTA/CTP 1. 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 While this finding is somewhat age indeterminate, subtle surrounding soft tissue attenuation suggests that there is likely an acute component. No visible downstream left MCA embolic occlusion. 2. 190 mL perfusion deficit within the left MCA distribution related to the above described severe stenosis/near occlusion at the left ICA terminus. No acute core infarct by CT perfusion. 3. Moderate atheromatous disease elsewhere about the major arterial vasculature of the head and neck. No other proximal high-grade or correctable  stenosis. 4. No acute traumatic injury within the cervical spine. 5. Aortic Atherosclerosis (ICD10-I70.0) and Emphysema (ICD10-J43.9).  MRI brain 1. 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 No associated hemorrhage or significant mass effect. 2. No other acute intracranial abnormality. 3. Underlying age-related cerebral atrophy with extensive chronic ischemic changes as above.  Impression: This is a 71 year old man with limited prior medical care presenting with some confusion and word finding errors in the past 3 days followed by an acute change in his mental status between 5 PM and 6:10 PM today.  Given the MRI findings, favor symptomatic hypoperfusion through a stenotic left ICA atheromatous lesion, now unstable and given area of the brain at risk, in need of acute intervention.  Recommendations: # Acute left MCA territory stroke, watershed distribution at time of admission - Stroke labs TSH, HgbA1c, fasting lipid panel - MRI brain 24 hours post intervention - MRA of the brain without contrast 24 hours post intervention - Frequent neuro checks - Echocardiogram - Carotid dopplers - Antiplatelets per neuro interventional radiology - Risk factor modification - Telemetry monitoring - Blood pressure goal   - Post successful uncomplicated revascularization SBP 120 - 140 for 24 hours; if complications have arisen or only partial revascularization reach out to interventionalist on call for BP goal - PT consult, OT consult, Speech consult, unless patient is back to baseline - Admitted to the stroke team for [facility] ICU  If patient is not extubated after procedure, will need to CCM consult for ventilator management  Lesleigh Noe MD-PhD [facility] [phone] Available 7 PM to 7 AM, outside of these hours please  call Neurologist on call as listed on Amion.  CRITICAL CARE Performed by: Lorenza Chick   Total critical care time: 80 minutes  Critical care time was exclusive of separately billable procedures and treating other patients.  Critical care was necessary to treat or prevent imminent or life-threatening deterioration.  Critical care was time spent personally by me on the following activities: development of treatment plan with patient and/or surrogate as well as nursing, discussions with consultants, evaluation of patient's response to treatment, examination of patient, obtaining history from patient or surrogate, ordering and performing treatments and interventions, ordering and review of laboratory studies, ordering and review of radiographic studies, pulse oximetry and re-evaluation of patient's condition.

## 2022-01-15 NOTE — ED Notes (Signed)
IR consult paged out

## 2022-01-16 ENCOUNTER — Inpatient Hospital Stay (HOSPITAL_COMMUNITY): Payer: Medicare Other

## 2022-01-16 ENCOUNTER — Other Ambulatory Visit (HOSPITAL_COMMUNITY): Payer: Self-pay

## 2022-01-16 DIAGNOSIS — J9601 Acute respiratory failure with hypoxia: Secondary | ICD-10-CM

## 2022-01-16 DIAGNOSIS — E7800 Pure hypercholesterolemia, unspecified: Secondary | ICD-10-CM

## 2022-01-16 DIAGNOSIS — I63512 Cerebral infarction due to unspecified occlusion or stenosis of left middle cerebral artery: Secondary | ICD-10-CM

## 2022-01-16 DIAGNOSIS — I255 Ischemic cardiomyopathy: Secondary | ICD-10-CM

## 2022-01-16 DIAGNOSIS — R0902 Hypoxemia: Secondary | ICD-10-CM

## 2022-01-16 DIAGNOSIS — R1312 Dysphagia, oropharyngeal phase: Secondary | ICD-10-CM

## 2022-01-16 DIAGNOSIS — I639 Cerebral infarction, unspecified: Secondary | ICD-10-CM

## 2022-01-16 LAB — GLUCOSE, CAPILLARY
Glucose-Capillary: 160 mg/dL — ABNORMAL HIGH (ref 70–99)
Glucose-Capillary: 106 mg/dL — ABNORMAL HIGH (ref 70–99)
Glucose-Capillary: 126 mg/dL — ABNORMAL HIGH (ref 70–99)
Glucose-Capillary: 100 mg/dL — ABNORMAL HIGH (ref 70–99)
Glucose-Capillary: 129 mg/dL — ABNORMAL HIGH (ref 70–99)
Glucose-Capillary: 108 mg/dL — ABNORMAL HIGH (ref 70–99)

## 2022-01-16 LAB — CBC
HCT: 44.3 % (ref 39.0–52.0)
Hemoglobin: 15.1 g/dL (ref 13.0–17.0)
MCH: 30.6 pg (ref 26.0–34.0)
MCHC: 34.1 g/dL (ref 30.0–36.0)
MCV: 89.7 fL (ref 80.0–100.0)
Platelets: 177 10*3/uL (ref 150–400)
RBC: 4.94 MIL/uL (ref 4.22–5.81)
RDW: 13.2 % (ref 11.5–15.5)
WBC: 11.2 10*3/uL — ABNORMAL HIGH (ref 4.0–10.5)
nRBC: 0 % (ref 0.0–0.2)

## 2022-01-16 LAB — LIPID PANEL
Cholesterol: 178 mg/dL (ref 0–200)
HDL: 40 mg/dL — ABNORMAL LOW (ref >40)
LDL Cholesterol: 129 mg/dL — ABNORMAL HIGH (ref 0–99)
Total CHOL/HDL Ratio: 4.5 RATIO
Triglycerides: 43 mg/dL (ref <150)
VLDL: 9 mg/dL (ref 0–40)

## 2022-01-16 LAB — ECHOCARDIOGRAM COMPLETE
AR max vel: 3.06 cm2
AV Area VTI: 2.86 cm2
AV Area mean vel: 2.98 cm2
AV Mean grad: 3 mmHg
AV Peak grad: 5.4 mmHg
Ao pk vel: 1.16 m/s
Calc EF: 36.5 %
Height: 70 in
MV VTI: 3.07 cm2
S' Lateral: 4.5 cm
Single Plane A2C EF: 26.8 %
Single Plane A4C EF: 37.1 %
Weight: 2906.54 oz

## 2022-01-16 LAB — BASIC METABOLIC PANEL
Anion gap: 12 (ref 5–15)
BUN: 19 mg/dL (ref 8–23)
CO2: 13 mmol/L — ABNORMAL LOW (ref 22–32)
Calcium: 8.2 mg/dL — ABNORMAL LOW (ref 8.9–10.3)
Chloride: 114 mmol/L — ABNORMAL HIGH (ref 98–111)
Creatinine, Ser: 0.85 mg/dL (ref 0.61–1.24)
GFR, Estimated: >60 mL/min (ref >60)
Glucose, Bld: 127 mg/dL — ABNORMAL HIGH (ref 70–99)
Potassium: 4.6 mmol/L (ref 3.5–5.1)
Sodium: 139 mmol/L (ref 135–145)

## 2022-01-16 LAB — HIV ANTIBODY (ROUTINE TESTING W REFLEX): HIV Screen 4th Generation wRfx: NONREACTIVE

## 2022-01-16 LAB — MRSA NEXT GEN BY PCR, NASAL: MRSA by PCR Next Gen: NOT DETECTED

## 2022-01-16 LAB — HEMOGLOBIN A1C
Hgb A1c MFr Bld: 5.1 % (ref 4.8–5.6)
Mean Plasma Glucose: 99.67 mg/dL

## 2022-01-16 IMAGING — DX DG CHEST 1V PORT
1 series · 1 of 1 positions shown · non-contrast
Comparison: Chest x-ray [DATE]

CLINICAL DATA: Hypoxia, altered mental status.

EXAM:
PORTABLE CHEST 1 VIEW

[chest ap]
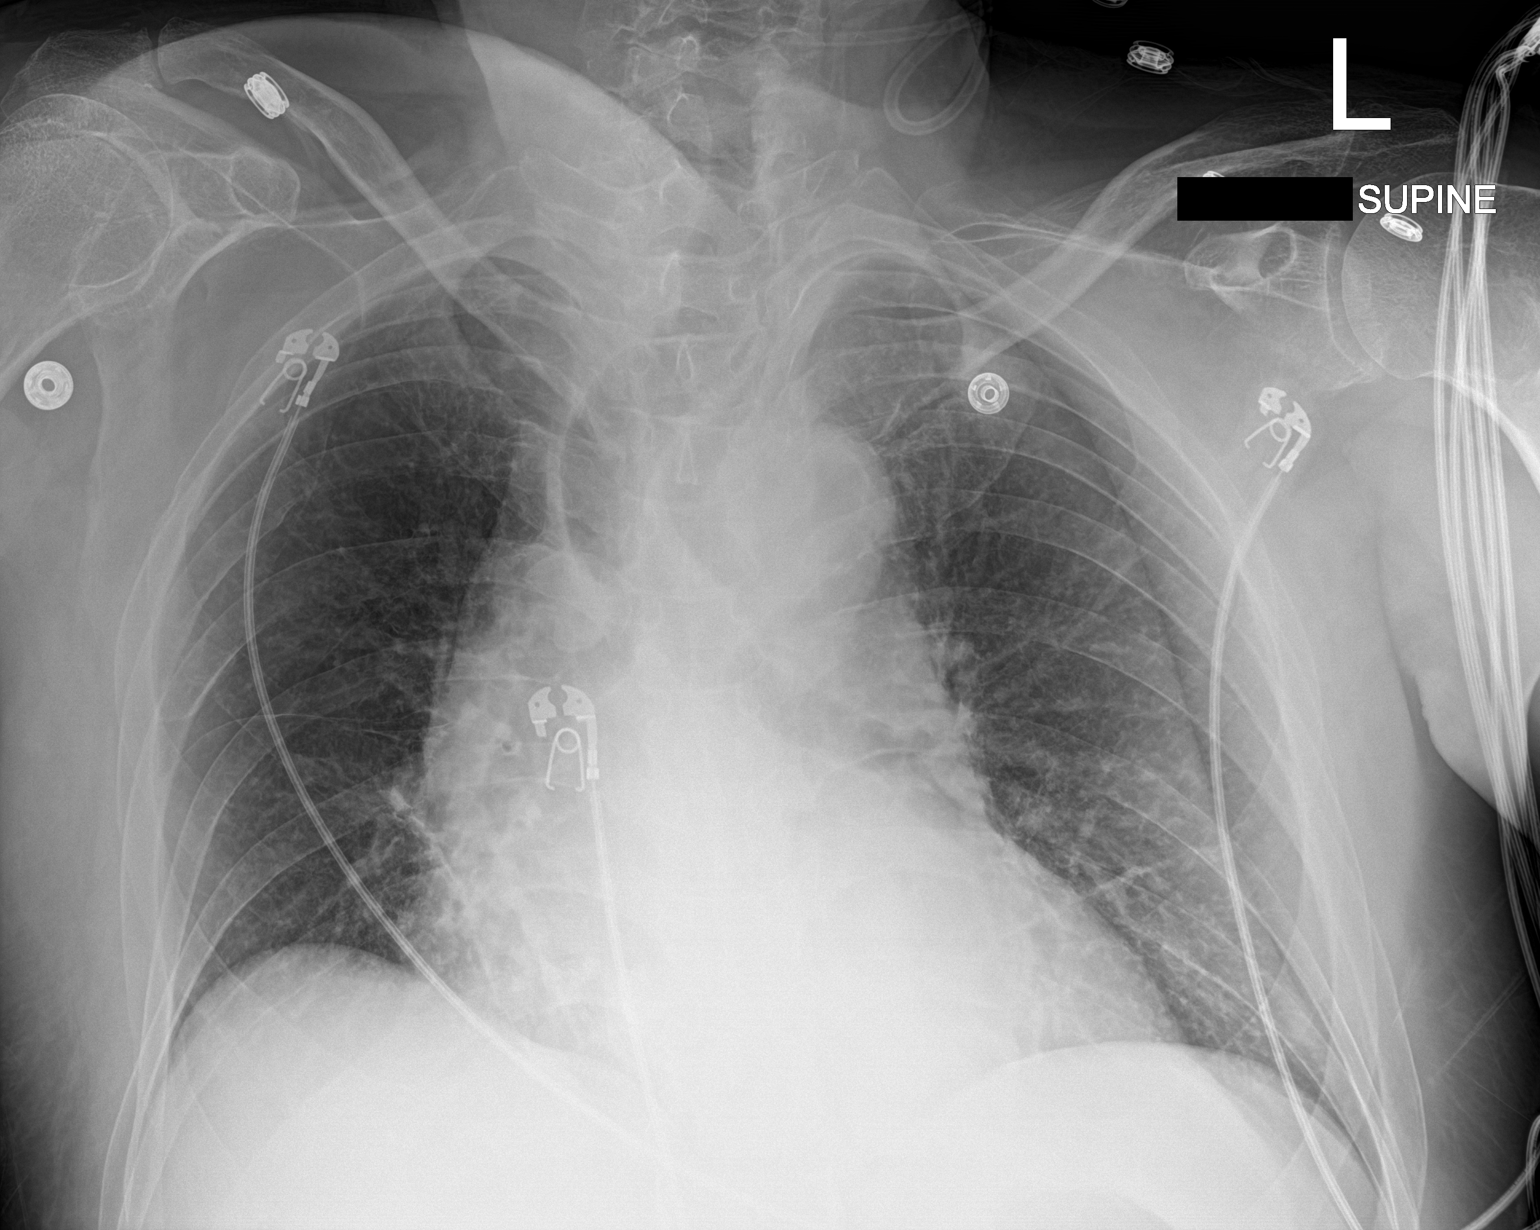

[1 of 1 positions shown; findings below may reference images not displayed]

FINDINGS: Aorta is ectatic in the heart is mildly enlarged, unchanged. The
lungs are clear. There is no pleural effusion or pneumothorax. No
acute fractures are seen.
IMPRESSION: 1. No acute cardiopulmonary process. No significant interval change.

## 2022-01-16 IMAGING — MR MR HEAD W/O CM
6 series · 40 of 48 positions shown · non-contrast
Comparison: Non-contrast head CT [DATE]. CT angiogram head/neck
[DATE]. Brain MRI [DATE].

CLINICAL DATA: Provided history: Stroke, follow-up.

EXAM:
MRI HEAD WITHOUT CONTRAST
MRA HEAD WITHOUT CONTRAST
TECHNIQUE: Multiplanar, multi-echo pulse sequences of the brain and surrounding
structures were acquired without intravenous contrast. Angiographic
images of the Circle of Willis were acquired using MRA technique
without intravenous contrast.

[Series 5: DWI · axial · 3.0mm · 1.09mm/px · z∈[-3,+140]mm · 9 of 106 slices shown (1 of 4)]
[im 1/106]
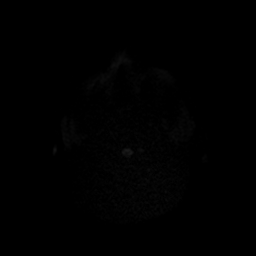
[im 16/106]
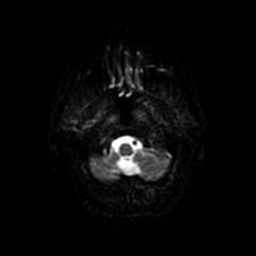
[im 31/106]
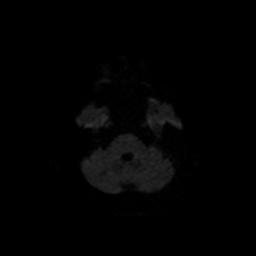
[im 46/106]
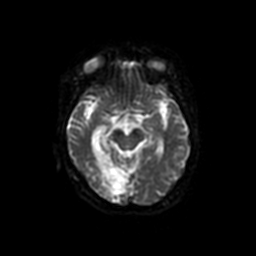
[im 53/106]
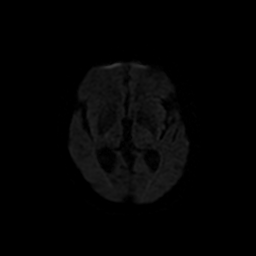
[im 61/106]
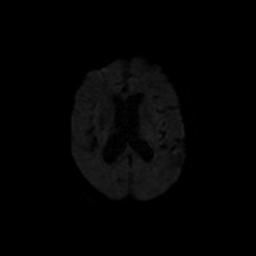
[im 76/106]
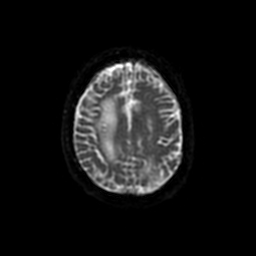
[im 91/106]
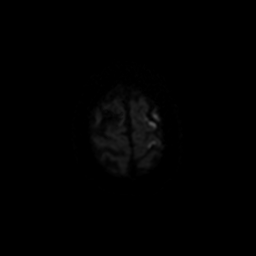
[im 106/106]
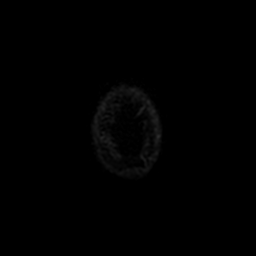

[Series 6: DWI · coronal · 5.0mm · 1.09mm/px · 11 of 76 slices shown (2 of 4)]
[im 1/76]
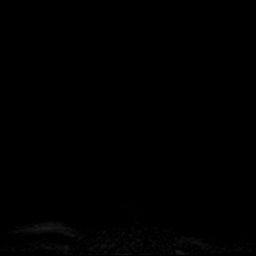
[im 8/76]
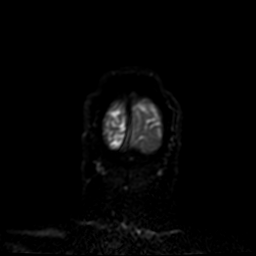
[im 16/76]
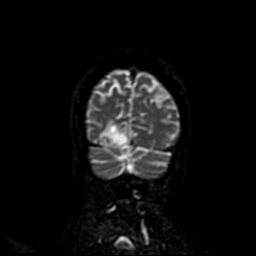
[im 23/76]
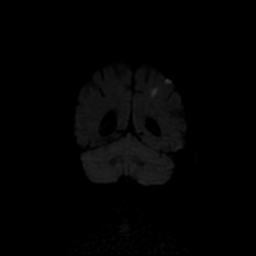
[im 31/76]
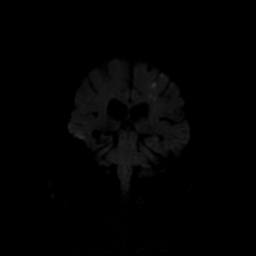
[im 38/76]
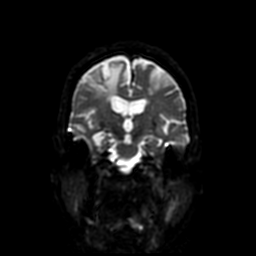
[im 46/76]
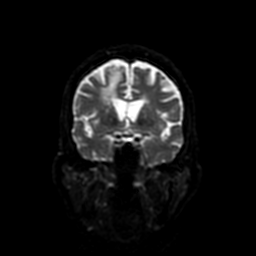
[im 53/76]
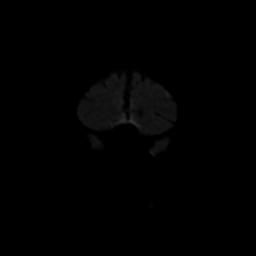
[im 61/76]
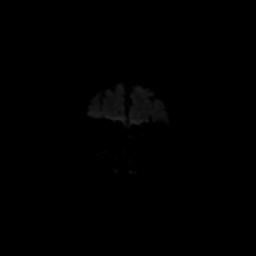
[im 68/76]
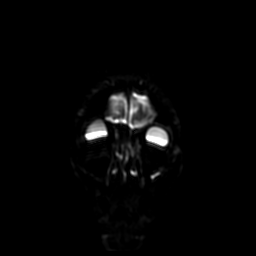
[im 76/76]
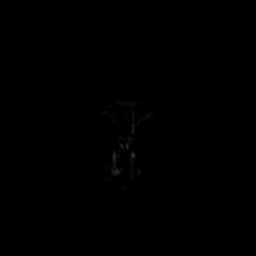

[Series 7: FLAIR · axial · 3.0mm · 0.43mm/px · z∈[-11,+127]mm · 4 of 26 slices shown]
[im 1/26]
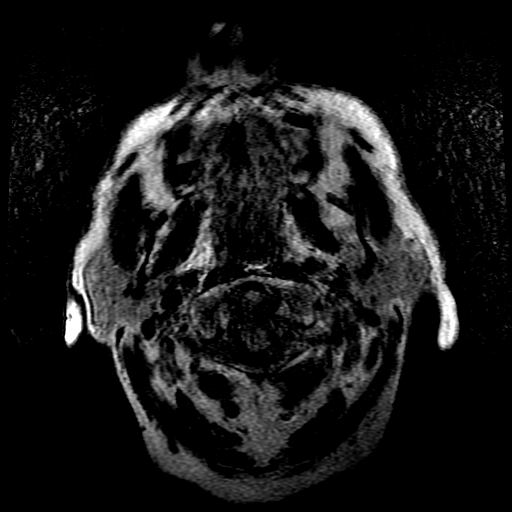
[im 9/26]
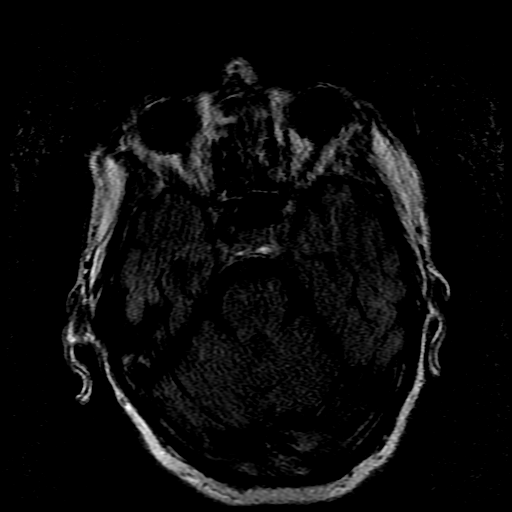
[im 17/26]
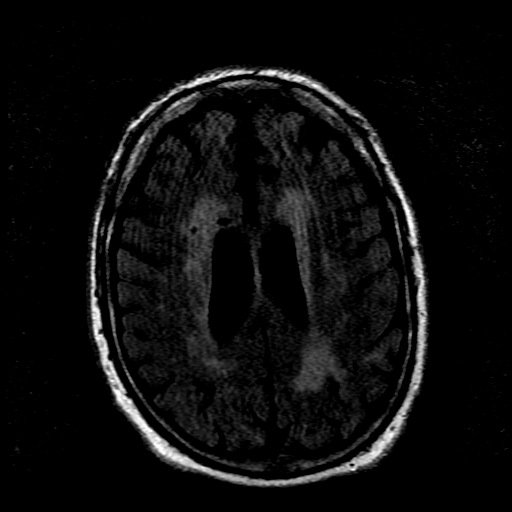
[im 26/26]
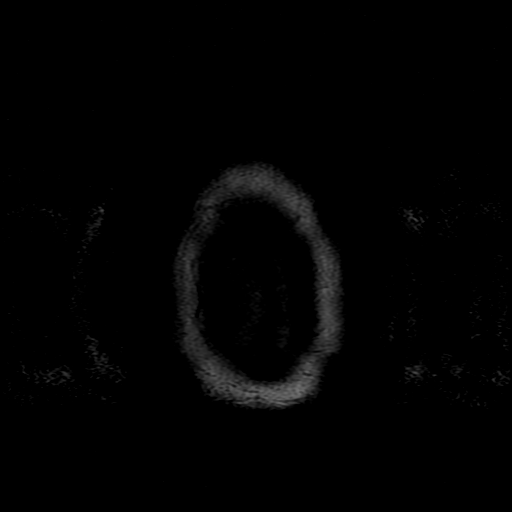

[Series 8: T1 · sagittal · 5.0mm · 0.47mm/px · 2 of 25 slices shown]
[im 1/25]
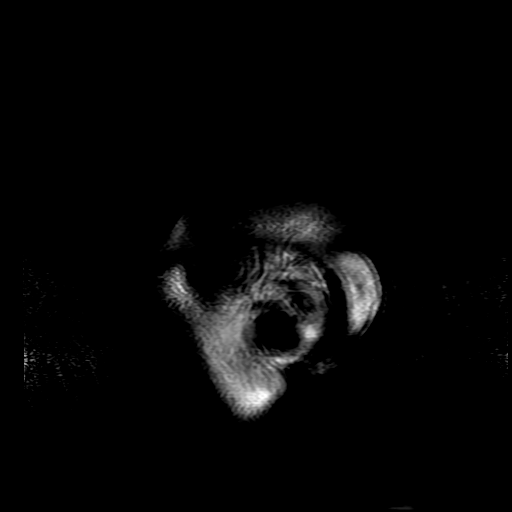
[im 9/25]
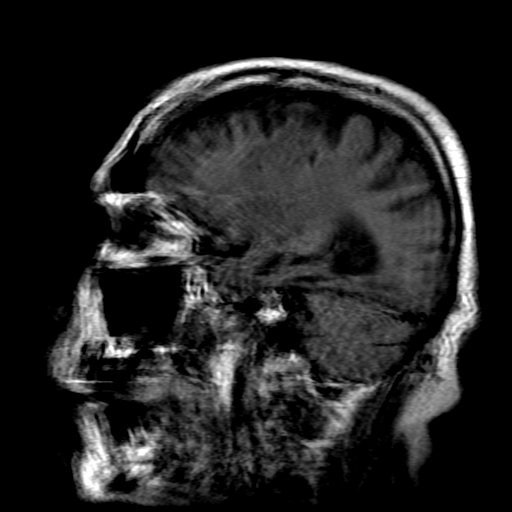

[Series 500: DWI · axial · 3.0mm · 1.09mm/px · z∈[-3,+140]mm · 8 of 53 slices shown (3 of 4)]
[im 1/53]
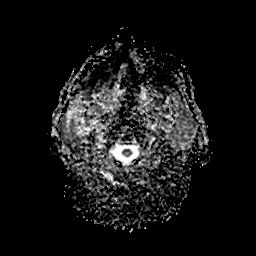
[im 8/53]
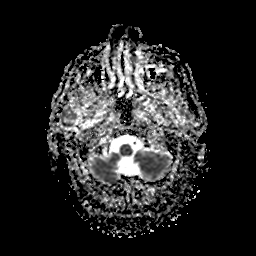
[im 15/53]
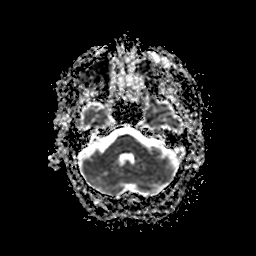
[im 23/53]
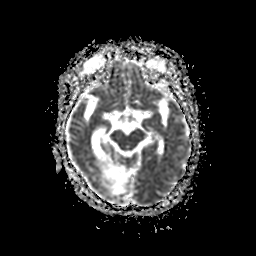
[im 30/53]
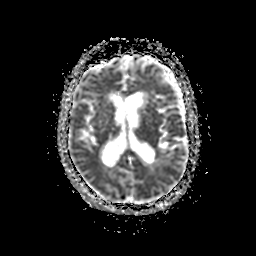
[im 38/53]
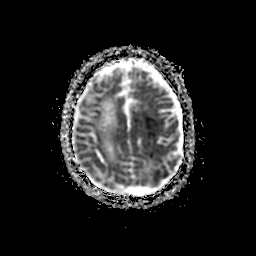
[im 45/53]
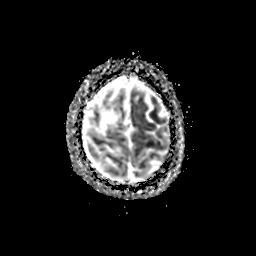
[im 53/53]
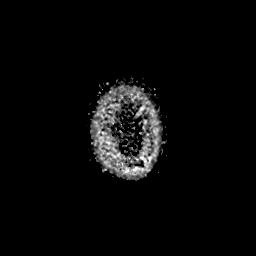

[Series 600: DWI · coronal · 5.0mm · 1.09mm/px · 6 of 38 slices shown (4 of 4)]
[im 1/38]
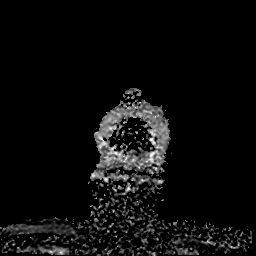
[im 8/38]
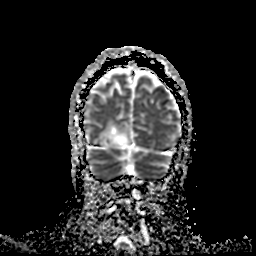
[im 15/38]
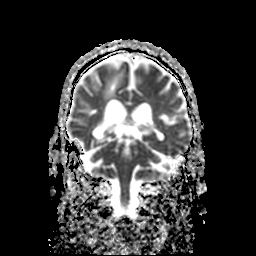
[im 23/38]
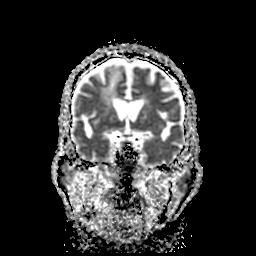
[im 30/38]
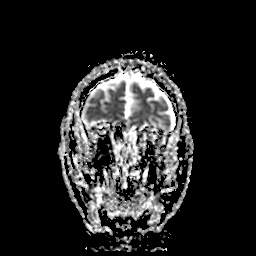
[im 38/38]
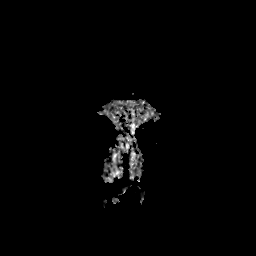

[40 of 48 positions shown; findings below may reference images not displayed]

FINDINGS: MRI HEAD FINDINGS

The patient was unable to tolerate the full examination. The
following sequences could be acquired prior to exam termination:
Axial diffusion-weighted, coronal diffusion-weighted, axial T2 FLAIR
and sagittal T1 weighted. The acquired sequences are motion
degraded, limiting evaluation. Most notably, the axial T2 FLAIR and
sagittal T1 weighted sequences are severely motion degraded.

Brain:

Mild generalized cerebral atrophy.

Redemonstrated patchy acute cortical/subcortical infarcts within the
left MCA territory, left MCA/ACA watershed territory and left
MCA/PCA watershed territory involving the left frontal, parietal and
temporal lobes. These infarcts have not significantly changed in
extent from the prior brain MRI of [DATE].

Chronic cortical/subcortical infarcts within the posterior right
frontal lobe, right parietal lobe, right occipital lobe and left
parietal lobe.

Advanced chronic small vessel ischemic changes within the cerebral
white matter.

Chronic lacunar infarcts within bilateral deep gray nuclei.

Small chronic infarcts within the right cerebellar hemisphere.

No extra-axial fluid collection.

No midline shift.

Vascular: Flow voids poorly assessed in the absence of T2 TSE
imaging.

Skull and upper cervical spine: Within described limitations, no
focal suspicious marrow lesion is identified.

Sinuses/Orbits: Within described limitations, there is no
appreciable acute orbital abnormality. Scattered paranasal sinus
mucosal thickening, greatest within the bilateral ethmoid and left
maxillary sinuses.

MRA HEAD FINDINGS

The examination is moderate to severely motion degraded,
significantly limiting evaluation.

Anterior circulation:

The intracranial internal carotid arteries are patent. The
previously demonstrated high-grade focal stenosis within the
supraclinoid left ICA is no longer appreciated. The M1 middle
cerebral arteries are patent. Persistent moderate stenosis within
the proximal M1 left MCA. Motion degradation precludes adequate
evaluation of the M2 and more distal MCA vessels. The anterior
cerebral arteries are patent without appreciable high-grade proximal
stenosis.

Posterior circulation:

The non dominant right vertebral artery terminates predominantly as
the right PICA. The dominant left vertebral artery is patent
intracranially without significant stenosis. The basilar artery is
patent. Mild atherosclerotic irregularity of the basilar artery. The
bilateral PCAs are predominantly fetal in origin. Both PCAs appear
patent. However, significant motion degradation precludes adequate
evaluation for, an accurate quantification of, stenoses within these
vessels.

Anatomic variants: As described.
IMPRESSION: MRI brain:

1. Prematurely terminated and motion degraded examination, as
described.
2. Redemonstrated patchy acute cortical/subcortical infarcts within
the left MCA territory, left MCA/ACA watershed territory and left
MCA/PCA watershed territory. These infarcts have not significant
changed in extent from the prior brain MRI of [DATE].
[DATE]. Background extensive chronic ischemic changes with multiple
chronic infarcts, as detailed.
4. Mild generalized cerebral atrophy.
5. Paranasal sinus disease, as described.

MRA head:

1. Significantly motion degraded examination, limiting evaluation as
described.
2. The previously demonstrated high-grade stenosis of the
supraclinoid left ICA is no longer appreciated.
3. Unchanged moderate stenosis within the proximal M1 left MCA.

## 2022-01-16 MED ORDER — PANTOPRAZOLE SODIUM 40 MG PO TBEC
40.0000 mg | DELAYED_RELEASE_TABLET | Freq: Every day | ORAL | Status: DC
  Administered 2022-01-16 – 2022-01-21 (×6): 40 mg via ORAL
  Filled 2022-01-16 (×6): qty 1

## 2022-01-16 MED ORDER — ROSUVASTATIN CALCIUM 20 MG PO TABS
20.0000 mg | ORAL_TABLET | Freq: Every day | ORAL | Status: DC
  Administered 2022-01-16 – 2022-01-21 (×6): 20 mg via ORAL
  Filled 2022-01-16 (×6): qty 1

## 2022-01-16 MED ORDER — ENOXAPARIN SODIUM 40 MG/0.4ML IJ SOSY
40.0000 mg | PREFILLED_SYRINGE | INTRAMUSCULAR | Status: DC
  Administered 2022-01-16 – 2022-01-20 (×5): 40 mg via SUBCUTANEOUS
  Filled 2022-01-16 (×5): qty 0.4

## 2022-01-16 MED ORDER — ASPIRIN 81 MG PO TBEC
81.0000 mg | DELAYED_RELEASE_TABLET | Freq: Every day | ORAL | Status: DC
  Administered 2022-01-17 – 2022-01-21 (×5): 81 mg via ORAL
  Filled 2022-01-16 (×5): qty 1

## 2022-01-16 MED ORDER — CLOPIDOGREL BISULFATE 75 MG PO TABS
75.0000 mg | ORAL_TABLET | Freq: Every day | ORAL | Status: DC
  Administered 2022-01-16 – 2022-01-21 (×6): 75 mg via ORAL
  Filled 2022-01-16 (×6): qty 1

## 2022-01-16 MED ORDER — ASPIRIN 81 MG PO TBEC: 81.0000 mg | DELAYED_RELEASE_TABLET | Freq: Every day | ORAL | Status: DC

## 2022-01-16 MED ORDER — AMLODIPINE BESYLATE 5 MG PO TABS
5.0000 mg | ORAL_TABLET | Freq: Every day | ORAL | Status: DC
  Administered 2022-01-16 – 2022-01-19 (×4): 5 mg via ORAL
  Filled 2022-01-16 (×4): qty 1

## 2022-01-16 NOTE — Progress Notes (Signed)
  Echocardiogram 2D Echocardiogram has been performed.  Gerda Diss 01/16/2022, 5:05 PM

## 2022-01-16 NOTE — Evaluation (Signed)
Speech Language Pathology Evaluation Patient Details Name: Casey Rangel MRN: [id number] DOB: [birth year] Today's Date: 01/16/2022 Time: 4098-1191 SLP Time Calculation (min) (ACUTE ONLY): 9 min  Problem List:  Patient Active Problem List   Diagnosis Date Noted   Acute ischemic left MCA stroke (HCC) 01/15/2022   Primary hypertension 11/14/2020   Closed left hip fracture (HCC) 10/17/2020   Left hip pain 10/17/2020   GERD (gastroesophageal reflux disease)    Prolonged QT interval    Elevated troponin    Anemia    Fall    Past Medical History:  Past Medical History:  Diagnosis Date   GERD (gastroesophageal reflux disease)    Hiatal hernia    Past Surgical History:  Past Surgical History:  Procedure Laterality Date   INTRAMEDULLARY (IM) NAIL INTERTROCHANTERIC Left 10/17/2020   Procedure: INTRAMEDULLARY (IM) NAIL INTERTROCHANTRIC;  Surgeon: Kennedy Bucker, MD;  Location: [facility] ORS;  Service: Orthopedics;  Laterality: Left;   IR CT HEAD LTD  01/15/2022   IR PERCUTANEOUS ART THROMBECTOMY/INFUSION INTRACRANIAL INC DIAG ANGIO  01/15/2022   IR PTA INTRACRANIAL  01/15/2022   IR US GUIDE VASC ACCESS RIGHT  01/15/2022   T9-T11 fusion in 1990     HPI:  Pt is a 70 y.o. male who presented with aphasia and right-sided weakness. MRI brain 5/31: Patchy acute ischemic nonhemorrhagic left MCA distribution  infarct, watershed in distribution. 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 Pt s/p angiography, thrombectomy and angioplasty 5/31. PMH: avoidance of healthcare, left hip fracture, intermittent memory impairment.   Assessment / Plan / Recommendation Clinical Impression  Pt was seen for speech-language evaluation. He presents with global aphasia characterized by significant impairments in receptive and expressive language. Pt inconsistently followed 1-step commands with verbal and tactile cues and responded to a single simple yes/no question with gestures during a functional  context. However, he did not independently follow commands or respond to yes/no questions. Verbal output was absent, but pt vocalized 2-3 times during the evaluation when automatic sequences were attempted. Skilled SLP services are clinically indicated at this time to improve language skills and for eventual speech and cognitive assessment as clinically indicated as verbal output improves.    SLP Assessment  SLP Recommendation/Assessment: Patient needs continued Speech Lanaguage Pathology Services SLP Visit Diagnosis: Aphasia (R47.01)    Recommendations for follow up therapy are one component of a multi-disciplinary discharge planning process, led by the attending physician.  Recommendations may be updated based on patient status, additional functional criteria and insurance authorization.    Follow Up Recommendations   (continued SLP services at level of care recommended by PT/OT)    Assistance Recommended at Discharge  Frequent or constant Supervision/Assistance  Functional Status Assessment Patient has had a recent decline in their functional status and demonstrates the ability to make significant improvements in function in a reasonable and predictable amount of time.  Frequency and Duration min 2x/week  2 weeks      SLP Evaluation Cognition  Overall Cognitive Status: Difficult to assess (due to severity of aphasia) Orientation Level: Other (comment) (pt non-verbal)       Comprehension  Auditory Comprehension Overall Auditory Comprehension: Impaired Yes/No Questions: Impaired Basic Biographical Questions:  (0/4) Commands: Impaired One Step Basic Commands:  (0/4)    Expression Expression Primary Mode of Expression: Verbal Verbal Expression Overall Verbal Expression: Impaired Initiation: Impaired Automatic Speech: Counting (0/5) Level of Generative/Spontaneous Verbalization:  (no verbal output) Repetition: Impaired Level of Impairment: Word level Naming:  Impairment  Confrontation:  (0/5)   Oral / Motor  Oral Motor/Sensory Function Overall Oral Motor/Sensory Function:  (difficult to assess) Motor Speech Overall Motor Speech:  (unable to assess)           Jazira Maloney I. Vear Clock, MS, CCC-SLP Acute Rehabilitation Services Office number [phone] Pager [phone]  Scheryl Marten 01/16/2022, 10:30 AM

## 2022-01-16 NOTE — Progress Notes (Addendum)
Patient ID: Casey Rangel, male   DOB: December 14, 1950, 71 y.o.   MRN: [id number]  New onset wheezing, O2 requirement, tachypnea  Neurological exam stable from my prior ED exam, other than RUE now 0/5 but RLE 2/5. Still globally aphasic, left gaze preference, right hemianopia, spontaneously moving left upper and lower extremities   CXR ordered  CCM consult placed  Brooke Dare MD-PhD [facility] [phone]   Additionally 20 min of critical care

## 2022-01-16 NOTE — Progress Notes (Signed)
PCCM Progress Note  PCCM consulted overnight for concern of ability to protect airway on PCCM arrival patient was stable with intake airway. Please see full consult note dictated 6/1 at 0100 for full details  AM of 6/1 patient remains stable on 4L [place] with no critical care needs identified. PCCM will sign off. Thank you for the opportunity to participate in this patient's care. Please contact if we can be of further assistance.  Maninder Deboer D. Kenton Kingfisher, NP-C [facility] Personal contact information can be found on Amion  01/16/2022, 8:56 AM

## 2022-01-16 NOTE — Anesthesia Postprocedure Evaluation (Signed)
Anesthesia Post Note  Patient: Casey Rangel  Procedure(s) Performed: RADIOLOGY WITH ANESTHESIA     Patient location during evaluation: Other Anesthesia Type: General Level of consciousness: awake and alert Pain management: pain level controlled Vital Signs Assessment: post-procedure vital signs reviewed and stable Respiratory status: spontaneous breathing, nonlabored ventilation, respiratory function stable and patient connected to nasal cannula oxygen Cardiovascular status: blood pressure returned to baseline and stable Postop Assessment: no apparent nausea or vomiting Anesthetic complications: no   No notable events documented.  Last Vitals:  Vitals:   01/16/22 0500 01/16/22 0600  BP: 123/69 117/71  Pulse: (!) 102 100  Resp: 20 16  Temp:    SpO2: 92% 95%    Last Pain:  Vitals:   01/16/22 0400  TempSrc: Axillary                 Lanell Carpenter,W. EDMOND

## 2022-01-16 NOTE — Consult Note (Signed)
NAME:  Casey Rangel, MRN:  [id number], DOB:  1951-06-27, LOS: 1 ADMISSION DATE:  01/15/2022, CONSULTATION DATE:  01/16/22 REFERRING MD:  Dr. Iver Nestle, CHIEF COMPLAINT:  hypoxia and tachypnea   History of Present Illness:  71 yo male with pmh L hi[ fracture, medical non adherence presented with acute R sided weakness, being found down and non verbal by his sister. All history is obtained via chart review as pt is unable to respond.  Per neuro physician pt was in his normal state of health until 3 days ago when his sister recalls him being more confused that usual. This morning she was able to talk with him around 10am and had seen him around the house all day. At approximately 1700 pt was seen in kitchen but did not say anything and returned to his room. He was then found down, non verbal with R sided weakness and she called 911.   Upon presentation pt was noted to have symptoms c/w L MCA CVA with severe near occlusive stenosis at the L ICA terminus. He was subsequently intubated and taken for angiography, thrombectomy and angioplasty. There was no hemorrhage noted post procedurally and he is to receive another MRI 24 h. Pt was extubated without issue post procedure and was reportedly doing ok from resp standpoint but over the course of the evening his oxygen requirement increased to 8L and he appeared to have some tachypnea with rates bouncing into the 30's for which CCM was consulted.   Upon arrival to see pt he was noted to be laying flat in bed at about a 30degree incline 2/2 need for straight leg. He was non verbal but would spontaneously cough. His respirations appeared even and non labored and he was sating 96% on 6L. D/w RN and Dr Iver Nestle appears to be protecting airway at this time. No acute interventions needed. Certainly high risk for need for airway protection if increase secretions or with po intake should that be possible as his clinical course continues. We will follow peripherally should this  occur. However, based on the pt's previous independence and medical non adherence it begs the question if he would desire intubation or long term medical support/placement should he have further respiratory compromise. This should be discussed at length with sister to delineate clear plan in am.   Pertinent  Medical History  H/o medical non adherence L hip fracture H/o syncope  Significant Hospital Events: Including procedures, antibiotic start and stop dates in addition to other pertinent events   5/31 presented with L MCA CVA s/p thrombectomy and angioplasty   Interim History / Subjective:  As above  Objective   Blood pressure 124/79, pulse (!) 109, temperature (!) 97.3 F (36.3 C), temperature source Axillary, resp. rate (!) 26, height 5\' 10"  (1.778 m), weight 82.4 kg, SpO2 96 %.        Intake/Output Summary (Last 24 hours) at 01/16/2022 0112 Last data filed at 01/16/2022 0100 Gross per 24 hour  Intake 1535.65 ml  Output 300 ml  Net 1235.65 ml   Filed Weights   01/15/22 1906 01/15/22 1938  Weight: 82.4 kg 82.4 kg    Examination: General: awake but not following commands, stares at R side of room, in nad laying flat in bed with spontaneous cough and no sonorous breathing HENT: PERRLA, EOMI, MM dry but pink Lungs: faint rhonchi in bases bilaterally Cardiovascular: RRR Abdomen: soft nt, nd bs + Extremities: L side spontaneously moving, R appears to hold some weight when he  shifts in bed and does withdrawal however I do not see it moving antigravity or to commands. No clubbing, cyanosis or edema Neuro: as above, non verbal, not following commands GU: deferred  Resolved Hospital Problem list     Assessment & Plan:  Acute hypoxic resp insuff:  -on 6L [place] at this time -appears to be protecting airway at this time -respirations are even and non labored -would encourage pulm hygiene as much as possible when pt able to sit up -aspiration precautions  -cxr clear -as above:  based on the pt's previous independence and medical non adherence it begs the question if he would desire intubation or long term medical support/placement should he have further respiratory compromise. This should be discussed at length with sister to delineate clear plan in am.  -ccm with likely sign off in am should pt remain stable, however do not hesitate to call with any questions or concerns.   L MCA CVA 2/2 ICA stenosis:  -s/p thrombectomy and angioplasty -per neuro  Best Practice (right click and "Reselect all SmartList Selections" daily)  NPO Diet/type: NPO DVT prophylaxis: SCD GI prophylaxis: N/A Lines: N/A Foley:  N/A Code Status:  full code Last date of multidisciplinary goals of care discussion [per primary]  Labs   CBC: Recent Labs  Lab 01/15/22 1908 01/15/22 1914  WBC 16.3*  --   NEUTROABS 13.9*  --   HGB 16.4 16.3  HCT 49.3 48.0  MCV 94.6  --   PLT 277  --     Basic Metabolic Panel: Recent Labs  Lab 01/15/22 1908 01/15/22 1914  NA 141 143  K 4.1 3.8  CL 112* 112*  CO2 16*  --   GLUCOSE 102* 106*  BUN 28* 34*  CREATININE 0.98 0.80  CALCIUM 9.1  --    GFR: Estimated Creatinine Clearance: 88.7 mL/min (by C-G formula based on SCr of 0.8 mg/dL). Recent Labs  Lab 01/15/22 1908  WBC 16.3*    Liver Function Tests: Recent Labs  Lab 01/15/22 1908  AST 28  ALT 15  ALKPHOS 56  BILITOT 1.7*  PROT 6.7  ALBUMIN 3.9   No results for input(s): LIPASE, AMYLASE in the last 168 hours. No results for input(s): AMMONIA in the last 168 hours.  ABG    Component Value Date/Time   TCO2 18 (L) 01/15/2022 1914     Coagulation Profile: Recent Labs  Lab 01/15/22 1908  INR 1.1    Cardiac Enzymes: No results for input(s): CKTOTAL, CKMB, CKMBINDEX, TROPONINI in the last 168 hours.  HbA1C: No results found for: HGBA1C  CBG: Recent Labs  Lab 01/15/22 1902 01/15/22 2328  GLUCAP 102* 130*    Review of Systems:   Unobtainable 2/2 neuro  symptoms.   Past Medical History:  He,  has a past medical history of GERD (gastroesophageal reflux disease) and Hiatal hernia.   Surgical History:   Past Surgical History:  Procedure Laterality Date   INTRAMEDULLARY (IM) NAIL INTERTROCHANTERIC Left 10/17/2020   Procedure: INTRAMEDULLARY (IM) NAIL INTERTROCHANTRIC;  Surgeon: Kennedy Bucker, MD;  Location: [facility] ORS;  Service: Orthopedics;  Laterality: Left;   T9-T11 fusion in 1990       Social History:   reports that he quit smoking about 22 years ago. His smoking use included cigarettes. He has a 45.00 pack-year smoking history. He has never used smokeless tobacco. He reports that he does not currently use alcohol. He reports that he does not use drugs.   Family History:  His family history is not on file.   Allergies Allergies  Allergen Reactions   Penicillins      Home Medications  Prior to Admission medications   Medication Sig Start Date End Date Taking? Authorizing Provider  docusate sodium (COLACE) 100 MG capsule Take 1 capsule (100 mg total) by mouth 2 (two) times daily. Patient not taking: Reported on 11/14/2020 10/19/20   Evon Slack, PA-C  enoxaparin (LOVENOX) 40 MG/0.4ML injection Inject 0.4 mLs (40 mg total) into the skin daily for 14 days. 10/19/20 11/02/20  Evon Slack, PA-C  HYDROcodone-acetaminophen (NORCO/VICODIN) 5-325 MG tablet Take 1 tablet by mouth every 4 (four) hours as needed for moderate pain (pain score 4-6). Patient not taking: Reported on 11/14/2020 10/19/20   Evon Slack, PA-C  omeprazole (PRILOSEC OTC) 20 MG tablet Take 20 mg by mouth daily. Patient not taking: Reported on 11/14/2020    [provider]  senna-docusate (SENOKOT-S) 8.6-50 MG tablet Take 1 tablet by mouth 2 (two) times daily between meals as needed for mild constipation. Patient not taking: Reported on 11/14/2020 10/22/20   Almon Hercules, MD  traZODone (DESYREL) 50 MG tablet Take 1 tablet (50 mg total) by mouth at  bedtime. Patient not taking: Reported on 11/14/2020 10/22/20   Almon Hercules, MD  Vitamin D, Ergocalciferol, (DRISDOL) 1.25 MG (50000 UNIT) CAPS capsule Take 1 capsule (50,000 Units total) by mouth every 7 (seven) days. Patient not taking: Reported on 11/14/2020 10/26/20   Almon Hercules, MD     Critical care time: 32 min excluding procedures from 0100-0700

## 2022-01-16 NOTE — Progress Notes (Signed)
Referring Physician(s): Dr. Iver Nestle  Supervising Physician: Oley Balm  Patient Status:  [facility] - In-pt  Chief Complaint: Code Stroke  Subjective: Patient sleeping upon arrival, arouses easily and becomes somewhat restless.  Global asphasia.  Does not follow commands.   Allergies: Penicillins  Medications: Prior to Admission medications   Medication Sig Start Date End Date Taking? Authorizing Provider  docusate sodium (COLACE) 100 MG capsule Take 1 capsule (100 mg total) by mouth 2 (two) times daily. Patient not taking: Reported on 11/14/2020 10/19/20   Evon Slack, PA-C  enoxaparin (LOVENOX) 40 MG/0.4ML injection Inject 0.4 mLs (40 mg total) into the skin daily for 14 days. Patient not taking: Reported on 01/16/2022 10/19/20 11/02/20  Evon Slack, PA-C  HYDROcodone-acetaminophen (NORCO/VICODIN) 5-325 MG tablet Take 1 tablet by mouth every 4 (four) hours as needed for moderate pain (pain score 4-6). Patient not taking: Reported on 11/14/2020 10/19/20   Evon Slack, PA-C  senna-docusate (SENOKOT-S) 8.6-50 MG tablet Take 1 tablet by mouth 2 (two) times daily between meals as needed for mild constipation. Patient not taking: Reported on 11/14/2020 10/22/20   Almon Hercules, MD  traZODone (DESYREL) 50 MG tablet Take 1 tablet (50 mg total) by mouth at bedtime. Patient not taking: Reported on 11/14/2020 10/22/20   Almon Hercules, MD  Vitamin D, Ergocalciferol, (DRISDOL) 1.25 MG (50000 UNIT) CAPS capsule Take 1 capsule (50,000 Units total) by mouth every 7 (seven) days. Patient not taking: Reported on 11/14/2020 10/26/20   Almon Hercules, MD     Vital Signs: BP 132/84   Pulse 78   Temp (!) 97.3 F (36.3 C) (Oral)   Resp (!) 24   Ht 5\' 10"  (1.778 m)   Wt 181 lb 10.5 oz (82.4 kg)   SpO2 96%   BMI 26.07 kg/m   Physical Exam NAD, alert Neuro: alert, restless, not following commands, global aphasia, spontaneous movement in right and left extremities, little to no movement in  right hand.   Pulses: distal pulses intact.  Groin: soft, non-tender.  No hematoma or pseudoaneurysm.   Imaging: MR ANGIO HEAD WO CONTRAST  Result Date: 01/16/2022 CLINICAL DATA:  Provided history: Stroke, follow-up. EXAM: MRI HEAD WITHOUT CONTRAST MRA HEAD WITHOUT CONTRAST TECHNIQUE: Multiplanar, multi-echo pulse sequences of the brain and surrounding structures were acquired without intravenous contrast. Angiographic images of the Circle of Willis were acquired using MRA technique without intravenous contrast. COMPARISON:  Non-contrast head CT 01/15/2022. CT angiogram head/neck 01/15/2022. Brain MRI 01/15/2022. FINDINGS: MRI HEAD FINDINGS The patient was unable to tolerate the full examination. The following sequences could be acquired prior to exam termination: Axial diffusion-weighted, coronal diffusion-weighted, axial T2 FLAIR and sagittal T1 weighted. The acquired sequences are motion degraded, limiting evaluation. Most notably, the axial T2 FLAIR and sagittal T1 weighted sequences are severely motion degraded. Brain: Mild generalized cerebral atrophy. Redemonstrated patchy acute cortical/subcortical infarcts within the left MCA territory, left MCA/ACA watershed territory and left MCA/PCA watershed territory involving the left frontal, parietal and temporal lobes. These infarcts have not significantly changed in extent from the prior brain MRI of 01/15/2022. Chronic cortical/subcortical infarcts within the posterior right frontal lobe, right parietal lobe, right occipital lobe and left parietal lobe. Advanced chronic small vessel ischemic changes within the cerebral white matter. Chronic lacunar infarcts within bilateral deep gray nuclei. Small chronic infarcts within the right cerebellar hemisphere. No extra-axial fluid collection. No midline shift. Vascular: Flow voids poorly assessed in the absence of T2 TSE imaging.  Skull and upper cervical spine: Within described limitations, no focal suspicious  marrow lesion is identified. Sinuses/Orbits: Within described limitations, there is no appreciable acute orbital abnormality. Scattered paranasal sinus mucosal thickening, greatest within the bilateral ethmoid and left maxillary sinuses. MRA HEAD FINDINGS The examination is moderate to severely motion degraded, significantly limiting evaluation. Anterior circulation: The intracranial internal carotid arteries are patent. The previously demonstrated high-grade focal stenosis within the supraclinoid left ICA is no longer appreciated. The M1 middle cerebral arteries are patent. 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 Motion degradation precludes adequate evaluation of the M2 and more distal MCA vessels. The anterior cerebral arteries are patent without appreciable high-grade proximal stenosis. Posterior circulation: The non dominant right vertebral artery terminates predominantly as the right PICA. The dominant left vertebral artery is patent intracranially without significant stenosis. The basilar artery is patent. Mild atherosclerotic irregularity of the basilar artery. The bilateral PCAs are predominantly fetal in origin. Both PCAs appear patent. However, significant motion degradation precludes adequate evaluation for, an accurate quantification of, stenoses within these vessels. Anatomic variants: As described. IMPRESSION: MRI brain: 1. Prematurely terminated and motion degraded examination, as described. 2. Redemonstrated patchy acute cortical/subcortical infarcts within the left MCA territory, left MCA/ACA watershed territory and left MCA/PCA watershed territory. These infarcts have not significant changed in extent from the prior brain MRI of 01/15/2022. 3. 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 4. Mild generalized cerebral atrophy. 5. Paranasal sinus disease, as described. MRA head: 1. Significantly motion degraded examination, limiting  evaluation as described. 2. 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 3. 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 Electronically Signed   By: Jackey Loge D.O.   On: 01/16/2022 15:19   MR BRAIN WO CONTRAST  Result Date: 01/16/2022 CLINICAL DATA:  Provided history: Stroke, follow-up. EXAM: MRI HEAD WITHOUT CONTRAST MRA HEAD WITHOUT CONTRAST TECHNIQUE: Multiplanar, multi-echo pulse sequences of the brain and surrounding structures were acquired without intravenous contrast. Angiographic images of the Circle of Willis were acquired using MRA technique without intravenous contrast. COMPARISON:  Non-contrast head CT 01/15/2022. CT angiogram head/neck 01/15/2022. Brain MRI 01/15/2022. FINDINGS: MRI HEAD FINDINGS The patient was unable to tolerate the full examination. The following sequences could be acquired prior to exam termination: Axial diffusion-weighted, coronal diffusion-weighted, axial T2 FLAIR and sagittal T1 weighted. The acquired sequences are motion degraded, limiting evaluation. Most notably, the axial T2 FLAIR and sagittal T1 weighted sequences are severely motion degraded. Brain: Mild generalized cerebral atrophy. Redemonstrated patchy acute cortical/subcortical infarcts within the left MCA territory, left MCA/ACA watershed territory and left MCA/PCA watershed territory involving the left frontal, parietal and temporal lobes. These infarcts have not significantly changed in extent from the prior brain MRI of 01/15/2022. Chronic cortical/subcortical infarcts within the posterior right frontal lobe, right parietal lobe, right occipital lobe and left parietal lobe. Advanced chronic small vessel ischemic changes within the cerebral white matter. Chronic lacunar infarcts within bilateral deep gray nuclei. Small chronic infarcts within the right cerebellar hemisphere. No extra-axial fluid collection. No midline shift. Vascular:  Flow voids poorly assessed in the absence of T2 TSE imaging. Skull and upper cervical spine: Within described limitations, no focal suspicious marrow lesion is identified. Sinuses/Orbits: Within described limitations, there is no appreciable acute orbital abnormality. Scattered paranasal sinus mucosal thickening, greatest within the bilateral ethmoid and left maxillary sinuses. MRA HEAD FINDINGS The examination is moderate to severely motion degraded,  significantly limiting evaluation. Anterior circulation: The intracranial internal carotid arteries are patent. The previously demonstrated high-grade focal stenosis within the supraclinoid left ICA is no longer appreciated. The M1 middle cerebral arteries are patent. 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 Motion degradation precludes adequate evaluation of the M2 and more distal MCA vessels. The anterior cerebral arteries are patent without appreciable high-grade proximal stenosis. Posterior circulation: The non dominant right vertebral artery terminates predominantly as the right PICA. The dominant left vertebral artery is patent intracranially without significant stenosis. The basilar artery is patent. Mild atherosclerotic irregularity of the basilar artery. The bilateral PCAs are predominantly fetal in origin. Both PCAs appear patent. However, significant motion degradation precludes adequate evaluation for, an accurate quantification of, stenoses within these vessels. Anatomic variants: As described. IMPRESSION: MRI brain: 1. Prematurely terminated and motion degraded examination, as described. 2. Redemonstrated patchy acute cortical/subcortical infarcts within the left MCA territory, left MCA/ACA watershed territory and left MCA/PCA watershed territory. These infarcts have not significant changed in extent from the prior brain MRI of 01/15/2022. 3. 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 4.  Mild generalized cerebral atrophy. 5. Paranasal sinus disease, as described. MRA head: 1. Significantly motion degraded examination, limiting evaluation as described. 2. 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 3. 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 Electronically Signed   By: Jackey Loge D.O.   On: 01/16/2022 15:19   MR BRAIN WO CONTRAST  Result Date: 01/15/2022 CLINICAL DATA:  Initial evaluation for acute neuro deficit, stroke. EXAM: MRI HEAD WITHOUT CONTRAST TECHNIQUE: Multiplanar, multiecho pulse sequences of the brain and surrounding structures were obtained without intravenous contrast. COMPARISON:  Prior CTs from earlier the same day. FINDINGS: Brain: Examination degraded by motion artifact. Generalized age-related cerebral atrophy. Moderately advanced chronic microvascular ischemic disease seen involving the supratentorial cerebral white matter. Multiple scatter remote cortical to subcortical infarcts noted involving the right frontal and parietal lobes, left parietal lobe, and right occipital lobe. Small remote right cerebellar infarct. Multiple remote lacunar infarcts present about the deep gray nuclei/corona radiata. Mild scattered chronic hemosiderin staining noted about a few of these chronic infarcts. Patchy restricted diffusion involving the cortical and subcortical aspect of the left frontal, parietal, and temporal lobes, consistent with acute left MCA distribution infarct. 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 No associated hemorrhage or mass effect. No other evidence for acute or subacute ischemia. No acute intracranial hemorrhage. No mass lesion, mass effect or midline shift. Previously noted finding at the fourth ventricular outflow tract on prior CT most likely reflects cord plexus. No hydrocephalus or  extra-axial fluid collection. Pituitary gland suprasellar region within normal limits. Vascular: Major intracranial vascular flow voids are maintained. Skull and upper cervical spine: Craniocervical junction within normal limits. Bone marrow signal intensity normal. No scalp soft tissue abnormality. Sinuses/Orbits: Globes and orbital soft tissues demonstrate no acute finding. Scattered mucosal thickening noted throughout the paranasal sinuses. Trace right mastoid effusion, of doubtful significance. Other: None. IMPRESSION: 1. 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 No associated hemorrhage or significant mass effect. 2. No other acute intracranial abnormality. 3. Underlying age-related cerebral atrophy with extensive chronic ischemic changes as above. Electronically Signed   By: Rise Mu  M.D.   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 Baseline modified Rankin scale 0-1. His last known well was 3 days ago when he started to develop confusion. However, at this point he did not have weakness or aphasia. He was found at 5 p.m. on 01/15/2022 with right-sided weakness and aphasia. Head CT showed multiple remote infarcts as well as age indeterminate infarct in the left MCA territory. No hemorrhage seen. CT/CT angiogram of the head and neck showed near occlusive stenosis of the left ICA terminus with likely presence of thrombus. CT perfusion showed no core infarct with approximately 190 mL of ischemic salvageable tissue. MRI was performed showing a watershed left MCA-ACA and MCA-PCA territory infarct with  volume much lower than the estimated tissue at risk by CT perfusion. Findings discussed with sister who consented for endovascular intervention. EXAM: ULTRASOUND-GUIDED VASCULAR ACCESS DIAGNOSTIC CEREBRAL ANGIOGRAM MECHANICAL THROMBECTOMY INTRACRANIAL ANGIOPLASTY FLAT PANEL HEAD CT COMPARISON:  None Available. MEDICATIONS: 650 mg of aspirin administered via OG tube. ANESTHESIA/SEDATION: The procedure was performed under general anesthesia. CONTRAST:  120 mL of Omnipaque 300 milligram/mL. FLUOROSCOPY: Radiation Exposure Index (as provided by the fluoroscopic device): 805.5 mGy Kerma COMPLICATIONS: None immediate. TECHNIQUE: Informed written consent was obtained from the patient's sister after a thorough discussion of the procedural risks, benefits and alternatives. All questions were addressed. Maximal Sterile Barrier Technique was utilized including caps, mask, sterile gowns, sterile gloves, sterile drape, hand hygiene and skin antiseptic. A timeout was performed prior to the initiation of the procedure. The right groin was prepped and draped in the usual sterile fashion. Using a micropuncture kit and the modified Seldinger technique, access was gained to the right common femoral artery and an 8 French sheath was placed. Real-time ultrasound guidance was utilized for vascular access including the acquisition of a permanent ultrasound image documenting patency of the accessed vessel. Under fluoroscopy, a Zoom 88 guide catheter was navigated over a 6 [place] Berenstein 2 catheter and a 0.035" Terumo Glidewire into the aortic arch. The catheter was placed into the left common carotid artery and then advanced into the left internal carotid artery. The diagnostic catheter was removed. Frontal and lateral angiograms of the head were obtained. FINDINGS: 1. Normal caliber of the right common femoral artery, adequate for vascular access. 2. 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 Significant delay in contrast opacification of the left ICA and ACA vascular tree. 3. Left fetal PCA with retrograde filling of the left pericallosal artery via posterior pericallosal leptomeningeal collaterals. There is also retrograde filling of the left MCA posterior division branch via leptomeningeal collaterals from the left PCA. 4. Diffuse intracranial atherosclerotic disease with multifocal areas of mild stenosis. PROCEDURE: Using biplane roadmap, a Zoom 55 aspiration catheter was navigated over an Aristotle 24 microguidewire into the cavernous segment of the left ICA. The aspiration catheter was then advanced to the level of occlusion and connected to an aspiration pump. Continuous aspiration was performed for 2 minutes. The guide catheter was connected to a VacLok syringe. The aspiration catheter was subsequently removed under constant aspiration. The guide catheter was aspirated for debris. Left internal carotid artery angiograms showed recanalization of the left ICA terminus with brisk contrast opacification of the  left ACA and MCA vascular tree. The ICA terminus stenosis has improved likely related to mild angioplasty from passage of the aspiration catheter. However, delayed angiograms showed high-grade restenoses of the ICA terminus. Then, a 3 x 15 mm apex balloon was navigated over an Aristotle 14 micro guidewire into the left ICA terminus under fluoroscopic guidance. Angioplasty was performed under fluoroscopy. Left internal carotid artery angiograms with magnified left anterior oblique and lateral views of the head showed significant improvement of the degree of stenosis with brisk anterograde contrast opacification of the left ACA and MCA vascular tree. This point, 650 mg of aspirin were administered via OG tube. Multiple delay angiograms of the left ICA with frontal and lateral views showed stable patency of the left ICA  terminus with brisk contrast opacification of the left anterior circulation. Flat panel CT of the head was obtained and post processed in a separate workstation with concurrent attending physician supervision. Selected images were sent to PACS. No evidence of hemorrhagic complication. Right common femoral artery angiogram was obtained in right anterior oblique and lateral views. The puncture is at the level of the common femoral artery. The artery has mild atherosclerotic changes without significant stenosis, adequate for closure device. The sheath was exchanged over the wire for an 8 [place] Angio-Seal which was utilized for access closure. Immediate hemostasis was achieved. IMPRESSION: Successful mechanical thrombectomy followed by angioplasty for treatment of near occlusive the stenosis and thrombus in the left ICA terminus with complete recanalization and brisk left anterior circulation anterograde flow. No evidence of thromboembolic or hemorrhagic complication. PLAN: Transfer to ICU for continued care. Continue ASA 325 mg q.d. Electronically Signed   By: Baldemar Lenis M.D.   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 Baseline modified Rankin scale 0-1. His last known well was 3 days ago when he started to develop confusion. However, at this point he did not have weakness or aphasia. He was found at 5 p.m. on 01/15/2022 with right-sided weakness and aphasia. Head CT showed multiple remote infarcts as well as age indeterminate infarct in the left MCA territory. No hemorrhage seen. CT/CT angiogram of the head and neck showed near occlusive stenosis of the left ICA terminus with likely presence of thrombus. CT perfusion showed no core infarct with approximately  190 mL of ischemic salvageable tissue. MRI was performed showing a watershed left MCA-ACA and MCA-PCA territory infarct with volume much lower than the estimated tissue at risk by CT perfusion. Findings discussed with sister who consented for endovascular intervention. EXAM: ULTRASOUND-GUIDED VASCULAR ACCESS DIAGNOSTIC CEREBRAL ANGIOGRAM MECHANICAL THROMBECTOMY INTRACRANIAL ANGIOPLASTY FLAT PANEL HEAD CT COMPARISON:  None Available. MEDICATIONS: 650 mg of aspirin administered via OG tube. ANESTHESIA/SEDATION: The procedure was performed under general anesthesia. CONTRAST:  120 mL of Omnipaque 300 milligram/mL. FLUOROSCOPY: Radiation Exposure Index (as provided by the fluoroscopic device): 805.5 mGy Kerma COMPLICATIONS: None immediate. TECHNIQUE: Informed written consent was obtained from the patient's sister after a thorough discussion of the procedural risks, benefits and alternatives. All questions were addressed. Maximal Sterile Barrier Technique was utilized including caps, mask, sterile gowns, sterile gloves, sterile drape, hand hygiene and skin antiseptic. A timeout was performed prior to the initiation of the procedure. The right groin was prepped and draped in the usual sterile fashion. Using a micropuncture  kit and the modified Seldinger technique, access was gained to the right common femoral artery and an 8 French sheath was placed. Real-time ultrasound guidance was utilized for vascular access including the acquisition of a permanent ultrasound image documenting patency of the accessed vessel. Under fluoroscopy, a Zoom 88 guide catheter was navigated over a 6 [place] Berenstein 2 catheter and a 0.035" Terumo Glidewire into the aortic arch. The catheter was placed into the left common carotid artery and then advanced into the left internal carotid artery. The diagnostic catheter was removed. Frontal and lateral angiograms of the head were obtained. FINDINGS: 1. Normal caliber of the right common femoral  artery, adequate for vascular access. 2. 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 Significant delay in contrast opacification of the left ICA and ACA vascular tree. 3. Left fetal PCA with retrograde filling of the left pericallosal artery via posterior pericallosal leptomeningeal collaterals. There is also retrograde filling of the left MCA posterior division branch via leptomeningeal collaterals from the left PCA. 4. Diffuse intracranial atherosclerotic disease with multifocal areas of mild stenosis. PROCEDURE: Using biplane roadmap, a Zoom 55 aspiration catheter was navigated over an Aristotle 24 microguidewire into the cavernous segment of the left ICA. The aspiration catheter was then advanced to the level of occlusion and connected to an aspiration pump. Continuous aspiration was performed for 2 minutes. The guide catheter was connected to a VacLok syringe. The aspiration catheter was subsequently removed under constant aspiration. The guide catheter was aspirated for debris. Left internal carotid artery angiograms showed recanalization of the left ICA terminus with brisk contrast opacification of the left ACA and MCA vascular tree. The ICA terminus stenosis has improved likely related to mild angioplasty from passage of the aspiration catheter. However, delayed angiograms showed high-grade restenoses of the ICA terminus. Then, a 3 x 15 mm apex balloon was navigated over an Aristotle 14 micro guidewire into the left ICA terminus under fluoroscopic guidance. Angioplasty was performed under fluoroscopy. Left internal carotid artery angiograms with magnified left anterior oblique and lateral views of the head showed significant improvement of the degree of stenosis with brisk anterograde contrast opacification of the left ACA and MCA vascular tree. This point, 650 mg of aspirin were administered via  OG tube. Multiple delay angiograms of the left ICA with frontal and lateral views showed stable patency of the left ICA terminus with brisk contrast opacification of the left anterior circulation. Flat panel CT of the head was obtained and post processed in a separate workstation with concurrent attending physician supervision. Selected images were sent to PACS. No evidence of hemorrhagic complication. Right common femoral artery angiogram was obtained in right anterior oblique and lateral views. The puncture is at the level of the common femoral artery. The artery has mild atherosclerotic changes without significant stenosis, adequate for closure device. The sheath was exchanged over the wire for an 8 [place] Angio-Seal which was utilized for access closure. Immediate hemostasis was achieved. IMPRESSION: Successful mechanical thrombectomy followed by angioplasty for treatment of near occlusive the stenosis and thrombus in the left ICA terminus with complete recanalization and brisk left anterior circulation anterograde flow. No evidence of thromboembolic or hemorrhagic complication. PLAN: Transfer to ICU for continued care. Continue ASA 325 mg q.d. Electronically Signed   By: Baldemar Lenis M.D.   On: 01/16/2022 09:43   IR US Guide Vasc Access Right  Result Date:  01/16/2022 INDICATION: 71 year old male with past medical history significant for multiple prior ischemic infarcts and hip replacement as well as healthcare avoidance presenting with aphasia and right-sided hemiplegia, NIHSS 26. Baseline modified Rankin scale 0-1. His last known well was 3 days ago when he started to develop confusion. However, at this point he did not have weakness or aphasia. He was found at 5 p.m. on 01/15/2022 with right-sided weakness and aphasia. Head CT showed multiple remote infarcts as well as age indeterminate infarct in the left MCA territory. No hemorrhage seen. CT/CT angiogram of the head and neck showed near  occlusive stenosis of the left ICA terminus with likely presence of thrombus. CT perfusion showed no core infarct with approximately 190 mL of ischemic salvageable tissue. MRI was performed showing a watershed left MCA-ACA and MCA-PCA territory infarct with volume much lower than the estimated tissue at risk by CT perfusion. Findings discussed with sister who consented for endovascular intervention. EXAM: ULTRASOUND-GUIDED VASCULAR ACCESS DIAGNOSTIC CEREBRAL ANGIOGRAM MECHANICAL THROMBECTOMY INTRACRANIAL ANGIOPLASTY FLAT PANEL HEAD CT COMPARISON:  None Available. MEDICATIONS: 650 mg of aspirin administered via OG tube. ANESTHESIA/SEDATION: The procedure was performed under general anesthesia. CONTRAST:  120 mL of Omnipaque 300 milligram/mL. FLUOROSCOPY: Radiation Exposure Index (as provided by the fluoroscopic device): 805.5 mGy Kerma COMPLICATIONS: None immediate. TECHNIQUE: Informed written consent was obtained from the patient's sister after a thorough discussion of the procedural risks, benefits and alternatives. All questions were addressed. Maximal Sterile Barrier Technique was utilized including caps, mask, sterile gowns, sterile gloves, sterile drape, hand hygiene and skin antiseptic. A timeout was performed prior to the initiation of the procedure. The right groin was prepped and draped in the usual sterile fashion. Using a micropuncture kit and the modified Seldinger technique, access was gained to the right common femoral artery and an 8 French sheath was placed. Real-time ultrasound guidance was utilized for vascular access including the acquisition of a permanent ultrasound image documenting patency of the accessed vessel. Under fluoroscopy, a Zoom 88 guide catheter was navigated over a 6 [place] Berenstein 2 catheter and a 0.035" Terumo Glidewire into the aortic arch. The catheter was placed into the left common carotid artery and then advanced into the left internal carotid artery. The diagnostic  catheter was removed. Frontal and lateral angiograms of the head were obtained. FINDINGS: 1. Normal caliber of the right common femoral artery, adequate for vascular access. 2. 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 Significant delay in contrast opacification of the left ICA and ACA vascular tree. 3. Left fetal PCA with retrograde filling of the left pericallosal artery via posterior pericallosal leptomeningeal collaterals. There is also retrograde filling of the left MCA posterior division branch via leptomeningeal collaterals from the left PCA. 4. Diffuse intracranial atherosclerotic disease with multifocal areas of mild stenosis. PROCEDURE: Using biplane roadmap, a Zoom 55 aspiration catheter was navigated over an Aristotle 24 microguidewire into the cavernous segment of the left ICA. The aspiration catheter was then advanced to the level of occlusion and connected to an aspiration pump. Continuous aspiration was performed for 2 minutes. The guide catheter was connected to a VacLok syringe. The aspiration catheter was subsequently removed under constant aspiration. The guide catheter was aspirated for debris. Left internal carotid artery angiograms showed recanalization of the left ICA terminus with brisk contrast opacification of the left ACA and MCA vascular tree. The ICA terminus stenosis has improved likely related  to mild angioplasty from passage of the aspiration catheter. However, delayed angiograms showed high-grade restenoses of the ICA terminus. Then, a 3 x 15 mm apex balloon was navigated over an Aristotle 14 micro guidewire into the left ICA terminus under fluoroscopic guidance. Angioplasty was performed under fluoroscopy. Left internal carotid artery angiograms with magnified left anterior oblique and lateral views of the head showed significant improvement of the degree of stenosis  with brisk anterograde contrast opacification of the left ACA and MCA vascular tree. This point, 650 mg of aspirin were administered via OG tube. Multiple delay angiograms of the left ICA with frontal and lateral views showed stable patency of the left ICA terminus with brisk contrast opacification of the left anterior circulation. Flat panel CT of the head was obtained and post processed in a separate workstation with concurrent attending physician supervision. Selected images were sent to PACS. No evidence of hemorrhagic complication. Right common femoral artery angiogram was obtained in right anterior oblique and lateral views. The puncture is at the level of the common femoral artery. The artery has mild atherosclerotic changes without significant stenosis, adequate for closure device. The sheath was exchanged over the wire for an 8 [place] Angio-Seal which was utilized for access closure. Immediate hemostasis was achieved. IMPRESSION: Successful mechanical thrombectomy followed by angioplasty for treatment of near occlusive the stenosis and thrombus in the left ICA terminus with complete recanalization and brisk left anterior circulation anterograde flow. No evidence of thromboembolic or hemorrhagic complication. PLAN: Transfer to ICU for continued care. Continue ASA 325 mg q.d. Electronically Signed   By: Baldemar Lenis M.D.   On: 01/16/2022 09:43   CT C-SPINE NO CHARGE  Result Date: 01/15/2022 CLINICAL DATA:  Initial evaluation for acute stroke. EXAM: CT ANGIOGRAPHY HEAD AND NECK CT PERFUSION BRAIN CT CERVICAL SPINE WITHOUT CONTRAST TECHNIQUE: Multidetector CT imaging of the head and neck was performed using the standard protocol during bolus administration of intravenous contrast. Multiplanar CT image reconstructions and MIPs were obtained to evaluate the vascular anatomy. Carotid stenosis measurements (when applicable) are obtained utilizing NASCET criteria, using the distal internal  carotid diameter as the denominator. Multiphase CT imaging of the brain was performed following IV bolus contrast injection. Subsequent parametric perfusion maps were calculated using RAPID software. RADIATION DOSE REDUCTION: This exam was performed according to the departmental dose-optimization program which includes automated exposure control, adjustment of the mA and/or kV according to patient size and/or use of iterative reconstruction technique. CONTRAST:  OMNIPAQUE IOHEXOL 350 MG/ML SOLN COMPARISON:  Prior CT from earlier the same day. FINDINGS: CTA NECK FINDINGS Aortic arch: Visualized aortic arch normal caliber with standard branching pattern. Moderately advanced atheromatous change about the arch and origin of the great vessels with associated intimal irregularity. Small amount of thrombus protrudes into the lumen of the proximal left subclavian artery (series 8, image 327). No high-grade stenosis about the origin the great vessels. Right carotid system: Right common and internal carotid arteries are tortuous. Prominent mixed plaque about the right carotid bulb/proximal right ICA without hemodynamically significant greater than 50% stenosis. Right ICA patent distally without stenosis or dissection. Left carotid system: Left common and internal carotid arteries are tortuous. Prominent mixed plaque about the left carotid bulb/proximal left ICA without hemodynamically significant stenosis. Left ICA patent distally without stenosis or dissection. Vertebral arteries: Both vertebral arteries arise from the subclavian arteries. Left vertebral artery dominant. No significant proximal subclavian artery stenosis. Vertebral arteries tortuous but patent without stenosis or dissection. Skeleton:  No acute fracture within the cervical spine. No discrete or worrisome osseous lesions. Mild degenerative spondylosis present at C5-6 and C6-7. Other neck: No other acute soft tissue abnormality within the neck. Upper  chest: Visualized upper chest demonstrates no acute finding. Centrilobular emphysema noted. Review of the MIP images confirms the above findings CTA HEAD FINDINGS Anterior circulation: Petrous segments patent bilaterally. Atheromatous change within the carotid siphons without hemodynamically significant stenosis. 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 A radiographic string sign is present. While this finding is somewhat age indeterminate, subtle surrounding soft tissue attenuation suggest at there is likely an acute component. Left M1 segment patent distally. No visible downstream left MCA branch occlusion. Right M1 segment and distal right MCA branches are widely patent. Posterior circulation: Atheromatous irregularity within the dominant left V4 segment without significant stenosis. Left PICA patent. Right vertebral artery largely terminates in PICA. Right PICA patent as well. Hypoplastic right V4 segment largely occludes beyond the takeoff of the right PICA. Basilar irregular and somewhat diminutive but patent to its distal aspect. Superior cerebral arteries patent bilaterally. Predominant fetal type origin of the PCAs. Both PCAs irregular but patent to their distal aspects without high-grade stenosis. Venous sinuses: Grossly patent allowing for timing the contrast bolus. Anatomic variants: As above.  No aneurysm. Review of the MIP images confirms the above findings CT Brain Perfusion Findings: ASPECTS: 10. CBF (<30%) Volume: 0mL Perfusion (Tmax>6.0s) volume: Mismatch Volume: Infarction Location:No evidence for acute core infarct by CT perfusion. Large 190 cc perfusion deficit within the left MCA distribution related to the above described severe stenosis/near occlusion at the left ICA terminus. IMPRESSION: 1. 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 While this finding is somewhat age  indeterminate, subtle surrounding soft tissue attenuation suggests that there is likely an acute component. No visible downstream left MCA embolic occlusion. 2. 190 mL perfusion deficit within the left MCA distribution related to the above described severe stenosis/near occlusion at the left ICA terminus. No acute core infarct by CT perfusion. 3. Moderate atheromatous disease elsewhere about the major arterial vasculature of the head and neck. No other proximal high-grade or correctable stenosis. 4. No acute traumatic injury within the cervical spine. 5. Aortic Atherosclerosis (ICD10-I70.0) and Emphysema (ICD10-J43.9). Critical Value/emergent results were called by telephone at the time of interpretation on 01/15/2022 at 7:40 p.m. to provider Dr. Iver Nestle, who verbally acknowledged these results. Electronically Signed   By: Rise Mu M.D.   On: 01/15/2022 20:36   DG CHEST PORT 1 VIEW  Result Date: 01/16/2022 CLINICAL DATA:  Hypoxia, altered mental status. EXAM: PORTABLE CHEST 1 VIEW COMPARISON:  Chest x-ray 10/17/2020 FINDINGS: Aorta is ectatic in the heart is mildly enlarged, unchanged. The lungs are clear. There is no pleural effusion or pneumothorax. No acute fractures are seen. IMPRESSION: 1. No acute cardiopulmonary process. No significant interval change. Electronically Signed   By: Darliss Cheney M.D.   On: 01/16/2022 00:45   DG Swallowing Func-Speech Pathology  Result Date: 01/16/2022 Table formatting from the original result was not included. Objective Swallowing Evaluation: Type of Study: Bedside Swallow Evaluation  Patient Details Name: Casey Rangel MRN: [id number] Date of Birth: Apr 11, 1951 Today's Date: 01/16/2022 Time: SLP Start Time (ACUTE ONLY): 1254 -SLP Stop Time (ACUTE ONLY): 1311 SLP Time Calculation (min) (ACUTE ONLY): 17 min Past Medical History: Past Medical History: Diagnosis Date  GERD (gastroesophageal reflux disease)  Hiatal hernia  Past Surgical History: Past Surgical History:  Procedure Laterality Date  INTRAMEDULLARY (IM) NAIL INTERTROCHANTERIC Left 10/17/2020  Procedure: INTRAMEDULLARY (IM) NAIL INTERTROCHANTRIC;  Surgeon: Kennedy Bucker, MD;  Location: [facility] ORS;  Service: Orthopedics;  Laterality: Left;  IR CT HEAD LTD  01/15/2022  IR PERCUTANEOUS ART THROMBECTOMY/INFUSION INTRACRANIAL INC DIAG ANGIO  01/15/2022  IR PTA INTRACRANIAL  01/15/2022  IR US GUIDE VASC ACCESS RIGHT  01/15/2022  RADIOLOGY WITH ANESTHESIA N/A 01/15/2022  Procedure: RADIOLOGY WITH ANESTHESIA;  Surgeon: Julieanne Cotton, MD;  Location: [facility] OR;  Service: Radiology;  Laterality: N/A;  T9-T11 fusion in 1990   HPI: Pt is a 71 y.o. male who presented with aphasia and right-sided weakness. MRI brain 5/31: Patchy acute ischemic nonhemorrhagic left MCA distribution  infarct, watershed in distribution. 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 Pt s/p angiography, thrombectomy and angioplasty 5/31. PMH: avoidance of healthcare, left hip fracture, intermittent memory impairment.  No data recorded  Recommendations for follow up therapy are one component of a multi-disciplinary discharge planning process, led by the attending physician.  Recommendations may be updated based on patient status, additional functional criteria and insurance authorization. Assessment / Plan / Recommendation   01/16/2022   1:12 PM Clinical Impressions Clinical Impression Pt presents with oropharyngeal dysphagia characterized by reduced bolus cohesion, weak lingual manipulation with solids, and a pharygeal delay. Pt exhibited difficulty with posterior propulsion of solids and lingual pumping was noted with solids. Mastication was prolonged and boluses of regular texture solids were held at the level of the oropharynx until verbal prompts were provided to swallow. He demonstrated premature spillage to the valleculae and pyriform sinuses with larger boluses of thin liquids which combined with the pharyngeal delay resulted in  penetration (PAS 3). A dysphagia 2 diet with thin liquids is recommended at this time, and SLP will follow for dysphagia treatment. SLP Visit Diagnosis Dysphagia, oropharyngeal phase (R13.12) Impact on safety and function Mild aspiration risk     01/16/2022   1:12 PM Treatment Recommendations Treatment Recommendations Therapy as outlined in treatment plan below     01/16/2022   1:12 PM Prognosis Prognosis for Safe Diet Advancement Good Barriers to Reach Goals Language deficits   01/16/2022   1:12 PM Diet Recommendations SLP Diet Recommendations Dysphagia 2 (Fine chop) solids;Thin liquid Liquid Administration via Cup;Straw Medication Administration Whole meds with liquid Compensations Slow rate;Small sips/bites;Follow solids with liquid Postural Changes Seated upright at 90 degrees     01/16/2022   1:12 PM Other Recommendations Follow Up Recommendations Acute inpatient rehab (3hours/day) Assistance recommended at discharge Frequent or constant Supervision/Assistance Functional Status Assessment Patient has had a recent decline in their functional status and demonstrates the ability to make significant improvements in function in a reasonable and predictable amount of time.   01/16/2022   1:12 PM Frequency and Duration  Speech Therapy Frequency (ACUTE ONLY) min 2x/week Treatment Duration 2 weeks     01/16/2022   1:12 PM Oral Phase Oral Phase Impaired Oral - Nectar Cup Weak lingual manipulation;Decreased bolus cohesion;Premature spillage Oral - Nectar Straw Weak lingual manipulation;Decreased bolus cohesion;Premature spillage Oral - Thin Cup Weak lingual manipulation;Decreased bolus cohesion;Premature spillage Oral - Thin Straw Weak lingual manipulation;Decreased bolus cohesion;Premature spillage Oral - Puree Weak lingual manipulation;Lingual pumping;Lingual/palatal residue;Delayed oral transit Oral - Regular Weak lingual manipulation;Lingual pumping;Lingual/palatal residue;Delayed oral transit;Impaired mastication Oral - Pill  Lingual pumping;Lingual/palatal residue    01/16/2022   1:12 PM Pharyngeal Phase Pharyngeal Phase Impaired Pharyngeal- Nectar Cup  Delayed swallow initiation-vallecula Pharyngeal- Nectar Straw Delayed swallow initiation-vallecula Pharyngeal- Thin Cup Delayed swallow initiation-vallecula;Delayed swallow initiation-pyriform sinuses;Penetration/Aspiration before swallow;Penetration/Aspiration during swallow Pharyngeal Material enters airway, remains ABOVE vocal cords and not ejected out Pharyngeal- Thin Straw Delayed swallow initiation-vallecula;Delayed swallow initiation-pyriform sinuses;Penetration/Aspiration before swallow Pharyngeal Material enters airway, remains ABOVE vocal cords and not ejected out Pharyngeal- Puree Delayed swallow initiation-vallecula Pharyngeal- Regular Delayed swallow initiation-vallecula Pharyngeal- Pill Delayed swallow initiation-vallecula;Delayed swallow initiation-pyriform sinuses    01/16/2022   1:12 PM Cervical Esophageal Phase  Cervical Esophageal Phase [facility] Shanika I. Vear Clock, MS, CCC-SLP Acute Rehabilitation Services Office number [phone] Pager [phone] Scheryl Marten 01/16/2022, 1:53 PM                     IR PERCUTANEOUS ART THROMBECTOMY/INFUSION INTRACRANIAL INC DIAG ANGIO  Result Date: 01/16/2022 INDICATION: 70 year old male with past medical history significant for multiple prior ischemic infarcts and hip replacement as well as healthcare avoidance presenting with aphasia and right-sided hemiplegia, NIHSS 26. Baseline modified Rankin scale 0-1. His last known well was 3 days ago when he started to develop confusion. However, at this point he did not have weakness or aphasia. He was found at 5 p.m. on 01/15/2022 with right-sided weakness and aphasia. Head CT showed multiple remote infarcts as well as age indeterminate infarct in the left MCA territory. No hemorrhage seen. CT/CT angiogram of the head and neck showed near occlusive stenosis of the left ICA terminus with  likely presence of thrombus. CT perfusion showed no core infarct with approximately 190 mL of ischemic salvageable tissue. MRI was performed showing a watershed left MCA-ACA and MCA-PCA territory infarct with volume much lower than the estimated tissue at risk by CT perfusion. Findings discussed with sister who consented for endovascular intervention. EXAM: ULTRASOUND-GUIDED VASCULAR ACCESS DIAGNOSTIC CEREBRAL ANGIOGRAM MECHANICAL THROMBECTOMY INTRACRANIAL ANGIOPLASTY FLAT PANEL HEAD CT COMPARISON:  None Available. MEDICATIONS: 650 mg of aspirin administered via OG tube. ANESTHESIA/SEDATION: The procedure was performed under general anesthesia. CONTRAST:  120 mL of Omnipaque 300 milligram/mL. FLUOROSCOPY: Radiation Exposure Index (as provided by the fluoroscopic device): 805.5 mGy Kerma COMPLICATIONS: None immediate. TECHNIQUE: Informed written consent was obtained from the patient's sister after a thorough discussion of the procedural risks, benefits and alternatives. All questions were addressed. Maximal Sterile Barrier Technique was utilized including caps, mask, sterile gowns, sterile gloves, sterile drape, hand hygiene and skin antiseptic. A timeout was performed prior to the initiation of the procedure. The right groin was prepped and draped in the usual sterile fashion. Using a micropuncture kit and the modified Seldinger technique, access was gained to the right common femoral artery and an 8 French sheath was placed. Real-time ultrasound guidance was utilized for vascular access including the acquisition of a permanent ultrasound image documenting patency of the accessed vessel. Under fluoroscopy, a Zoom 88 guide catheter was navigated over a 6 [place] Berenstein 2 catheter and a 0.035" Terumo Glidewire into the aortic arch. The catheter was placed into the left common carotid artery and then advanced into the left internal carotid artery. The diagnostic catheter was removed. Frontal and lateral angiograms  of the head were obtained. FINDINGS: 1. Normal caliber of the right common femoral artery, adequate for vascular access. 2. 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 Significant delay in contrast opacification of the left ICA and ACA vascular tree. 3. Left fetal PCA with retrograde filling of the left pericallosal  artery via posterior pericallosal leptomeningeal collaterals. There is also retrograde filling of the left MCA posterior division branch via leptomeningeal collaterals from the left PCA. 4. Diffuse intracranial atherosclerotic disease with multifocal areas of mild stenosis. PROCEDURE: Using biplane roadmap, a Zoom 55 aspiration catheter was navigated over an Aristotle 24 microguidewire into the cavernous segment of the left ICA. The aspiration catheter was then advanced to the level of occlusion and connected to an aspiration pump. Continuous aspiration was performed for 2 minutes. The guide catheter was connected to a VacLok syringe. The aspiration catheter was subsequently removed under constant aspiration. The guide catheter was aspirated for debris. Left internal carotid artery angiograms showed recanalization of the left ICA terminus with brisk contrast opacification of the left ACA and MCA vascular tree. The ICA terminus stenosis has improved likely related to mild angioplasty from passage of the aspiration catheter. However, delayed angiograms showed high-grade restenoses of the ICA terminus. Then, a 3 x 15 mm apex balloon was navigated over an Aristotle 14 micro guidewire into the left ICA terminus under fluoroscopic guidance. Angioplasty was performed under fluoroscopy. Left internal carotid artery angiograms with magnified left anterior oblique and lateral views of the head showed significant improvement of the degree of stenosis with brisk anterograde contrast opacification of the  left ACA and MCA vascular tree. This point, 650 mg of aspirin were administered via OG tube. Multiple delay angiograms of the left ICA with frontal and lateral views showed stable patency of the left ICA terminus with brisk contrast opacification of the left anterior circulation. Flat panel CT of the head was obtained and post processed in a separate workstation with concurrent attending physician supervision. Selected images were sent to PACS. No evidence of hemorrhagic complication. Right common femoral artery angiogram was obtained in right anterior oblique and lateral views. The puncture is at the level of the common femoral artery. The artery has mild atherosclerotic changes without significant stenosis, adequate for closure device. The sheath was exchanged over the wire for an 8 [place] Angio-Seal which was utilized for access closure. Immediate hemostasis was achieved. IMPRESSION: Successful mechanical thrombectomy followed by angioplasty for treatment of near occlusive the stenosis and thrombus in the left ICA terminus with complete recanalization and brisk left anterior circulation anterograde flow. No evidence of thromboembolic or hemorrhagic complication. PLAN: Transfer to ICU for continued care. Continue ASA 325 mg q.d. Electronically Signed   By: Baldemar Lenis M.D.   On: 01/16/2022 09:43   CT HEAD CODE STROKE WO CONTRAST  Result Date: 01/15/2022 CLINICAL DATA:  Code stroke. EXAM: CT HEAD WITHOUT CONTRAST TECHNIQUE: Contiguous axial images were obtained from the base of the skull through the vertex without intravenous contrast. RADIATION DOSE REDUCTION: This exam was performed according to the departmental dose-optimization program which includes automated exposure control, adjustment of the mA and/or kV according to patient size and/or use of iterative reconstruction technique. COMPARISON:  None available. FINDINGS: Brain: Generalized age-related cerebral atrophy with moderately  advanced chronic microvascular ischemic disease. Several remote lacunar infarcts present about the bilateral basal ganglia and thalami. Multifocal areas of encephalomalacia involving the right parietal and occipital lobes as well as the left temporal occipital region, consistent with chronic ischemic infarcts. Small remote right cerebellar infarct. No acute intracranial hemorrhage. Note made of a 7 mm partially calcified lesion at the fourth ventricular outflow tract (series 2, image 7), indeterminate, but favored to be vascular in nature, possibly reflecting aneurysm. No other mass lesion, mass effect or midline shift.  No hydrocephalus or extra-axial fluid collection. Vascular: No hyperdense vessel. 7 mm fourth ventricular lesion as above. Calcified atherosclerosis present at skull base. Skull: Scalp soft tissues and calvarium within normal limits. Sinuses/Orbits: Left gaze noted. Scattered mucosal thickening noted about the ethmoidal air cells and maxillary sinuses. Mastoid air cells are clear. Other: None. ASPECTS [facility] Stroke Program Early CT Score) - Ganglionic level infarction (caudate, lentiform nuclei, internal capsule, insula, M1-M3 cortex): 7 - Supraganglionic infarction (M4-M6 cortex): 3 Total score (0-10 with 10 being normal): 10 IMPRESSION: 1. No acute intracranial abnormality. 2. ASPECTS is 10. 3. Atrophy with moderate chronic small vessel ischemic disease with multiple remote ischemic infarcts as above. 4. Approximate 7 mm lesion at the level of the fourth ventricular outflow tract, indeterminate, but favored to be vascular in nature, potentially reflecting aneurysm. These results were communicated to Dr. Iver Nestle at 7:19 pm on 01/15/2022 by text page via the [facility] messaging system. Electronically Signed   By: Rise Mu M.D.   On: 01/15/2022 19:23   CT ANGIO HEAD NECK W WO CM W PERF (CODE STROKE)  Result Date: 01/15/2022 CLINICAL DATA:  Initial evaluation for acute stroke. EXAM: CT  ANGIOGRAPHY HEAD AND NECK CT PERFUSION BRAIN CT CERVICAL SPINE WITHOUT CONTRAST TECHNIQUE: Multidetector CT imaging of the head and neck was performed using the standard protocol during bolus administration of intravenous contrast. Multiplanar CT image reconstructions and MIPs were obtained to evaluate the vascular anatomy. Carotid stenosis measurements (when applicable) are obtained utilizing NASCET criteria, using the distal internal carotid diameter as the denominator. Multiphase CT imaging of the brain was performed following IV bolus contrast injection. Subsequent parametric perfusion maps were calculated using RAPID software. RADIATION DOSE REDUCTION: This exam was performed according to the departmental dose-optimization program which includes automated exposure control, adjustment of the mA and/or kV according to patient size and/or use of iterative reconstruction technique. CONTRAST:  OMNIPAQUE IOHEXOL 350 MG/ML SOLN COMPARISON:  Prior CT from earlier the same day. FINDINGS: CTA NECK FINDINGS Aortic arch: Visualized aortic arch normal caliber with standard branching pattern. Moderately advanced atheromatous change about the arch and origin of the great vessels with associated intimal irregularity. Small amount of thrombus protrudes into the lumen of the proximal left subclavian artery (series 8, image 327). No high-grade stenosis about the origin the great vessels. Right carotid system: Right common and internal carotid arteries are tortuous. Prominent mixed plaque about the right carotid bulb/proximal right ICA without hemodynamically significant greater than 50% stenosis. Right ICA patent distally without stenosis or dissection. Left carotid system: Left common and internal carotid arteries are tortuous. Prominent mixed plaque about the left carotid bulb/proximal left ICA without hemodynamically significant stenosis. Left ICA patent distally without stenosis or dissection. Vertebral arteries: Both  vertebral arteries arise from the subclavian arteries. Left vertebral artery dominant. No significant proximal subclavian artery stenosis. Vertebral arteries tortuous but patent without stenosis or dissection. Skeleton: No acute fracture within the cervical spine. No discrete or worrisome osseous lesions. Mild degenerative spondylosis present at C5-6 and C6-7. Other neck: No other acute soft tissue abnormality within the neck. Upper chest: Visualized upper chest demonstrates no acute finding. Centrilobular emphysema noted. Review of the MIP images confirms the above findings CTA HEAD FINDINGS Anterior circulation: Petrous segments patent bilaterally. Atheromatous change within the carotid siphons without hemodynamically significant stenosis. 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 A radiographic string sign is present. While this finding is somewhat age indeterminate,  subtle surrounding soft tissue attenuation suggest at there is likely an acute component. Left M1 segment patent distally. No visible downstream left MCA branch occlusion. Right M1 segment and distal right MCA branches are widely patent. Posterior circulation: Atheromatous irregularity within the dominant left V4 segment without significant stenosis. Left PICA patent. Right vertebral artery largely terminates in PICA. Right PICA patent as well. Hypoplastic right V4 segment largely occludes beyond the takeoff of the right PICA. Basilar irregular and somewhat diminutive but patent to its distal aspect. Superior cerebral arteries patent bilaterally. Predominant fetal type origin of the PCAs. Both PCAs irregular but patent to their distal aspects without high-grade stenosis. Venous sinuses: Grossly patent allowing for timing the contrast bolus. Anatomic variants: As above.  No aneurysm. Review of the MIP images confirms the above findings CT Brain Perfusion Findings: ASPECTS: 10. CBF (<30%) Volume: 0mL  Perfusion (Tmax>6.0s) volume: Mismatch Volume: Infarction Location:No evidence for acute core infarct by CT perfusion. Large 190 cc perfusion deficit within the left MCA distribution related to the above described severe stenosis/near occlusion at the left ICA terminus. IMPRESSION: 1. 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 While this finding is somewhat age indeterminate, subtle surrounding soft tissue attenuation suggests that there is likely an acute component. No visible downstream left MCA embolic occlusion. 2. 190 mL perfusion deficit within the left MCA distribution related to the above described severe stenosis/near occlusion at the left ICA terminus. No acute core infarct by CT perfusion. 3. Moderate atheromatous disease elsewhere about the major arterial vasculature of the head and neck. No other proximal high-grade or correctable stenosis. 4. No acute traumatic injury within the cervical spine. 5. Aortic Atherosclerosis (ICD10-I70.0) and Emphysema (ICD10-J43.9). Critical Value/emergent results were called by telephone at the time of interpretation on 01/15/2022 at 7:40 p.m. to provider Dr. Iver Nestle, who verbally acknowledged these results. Electronically Signed   By: Rise Mu M.D.   On: 01/15/2022 20:36    Labs:  CBC: Recent Labs    01/15/22 1908 01/15/22 1914 01/16/22 0449  WBC 16.3*  --  11.2*  HGB 16.4 16.3 15.1  HCT 49.3 48.0 44.3  PLT 277  --  177    COAGS: Recent Labs    01/15/22 1908  INR 1.1  APTT 24    BMP: Recent Labs    01/15/22 1908 01/15/22 1914 01/16/22 0449  NA 141 143 139  K 4.1 3.8 4.6  CL 112* 112* 114*  CO2 16*  --  13*  GLUCOSE 102* 106* 127*  BUN 28* 34* 19  CALCIUM 9.1  --  8.2*  CREATININE 0.98 0.80 0.85  GFRNONAA >60  --  >60    LIVER FUNCTION TESTS: Recent Labs    01/15/22 1908  BILITOT 1.7*  AST 28  ALT 15  ALKPHOS 56  PROT 6.7  ALBUMIN 3.9     Assessment and Plan: L MCA infarct, left ICA occlusion s/p mechanical thrombectomy and angioplasty of left carotid with TICI3 revascularization.  Patient alert upon awakening, restless.  Global asphasia, not following commands. DAPT per Neuro- history of noncompliance.  Procedure site intact, clean, and dry.  Distal pulses intact.  No current needs in IR.   Electronically Signed: Hoyt Koch, PA 01/16/2022, 4:18 PM   I spent a total of 15 Minutes at the the patient's bedside AND on the patient's hospital floor or unit, greater than 50% of which was counseling/coordinating care for L MCA infarct.

## 2022-01-16 NOTE — Progress Notes (Signed)
OT Cancellation Note  Patient Details Name: Casey Rangel MRN: [id number] DOB: 24-Mar-1951   Cancelled Treatment:    Reason Eval/Treat Not Completed: Active bedrest order Will assess when activity orders updated.   Maxene Byington,HILLARY 01/16/2022, 8:02 AM Luisa Dago, OT/L   Acute OT Clinical Specialist Acute Rehabilitation Services Pager [phone] Office [phone]

## 2022-01-16 NOTE — Evaluation (Signed)
Occupational Therapy Evaluation Patient Details Name: Casey Rangel MRN: [id number] DOB: 1951-04-27 Today's Date: 01/16/2022   History of Present Illness 71 yo admitted with acute R sided weakness and aphasia, found down non-responsive by his sister. 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 MRI pending. PMH L hip fx ; T9-11 fusion   Clinical Impression   Called sister to get PLOF. PTA pt lives with independently with his sister, drives and is responsible for his medication and financial management. Sister reports increased difficulty with  IADL tasks over the last 3 months, making several financial errors, ordering a computer x 2 due to recent difficulty with memory. 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 Pt nonverbal during session, however attempting to vocalize at times and  leaning towards therapist at times as if he could not hear me. Able to progress to EOB with mod A then Mod A +2 for safety to stand step to chair. Pt with apparent apraxia, requiring Max A for ADL tasks. Pt will need post acute rehab. Recommend AIR at this time. Will follow.      Recommendations for follow up therapy are one component of a multi-disciplinary discharge planning process, led by the attending physician.  Recommendations may be updated based on patient status, additional functional criteria and insurance authorization.   Follow Up Recommendations  Acute inpatient rehab (3hours/day)    Assistance Recommended at Discharge Frequent or constant Supervision/Assistance  Patient can return home with the following A lot of help with walking and/or transfers;A lot of help with bathing/dressing/bathroom;Assistance with cooking/housework;Direct supervision/assist for medications management;Direct supervision/assist for financial management;Assist for transportation;Help with stairs or ramp for entrance    Functional Status Assessment  Patient has had a recent decline in their functional  status and demonstrates the ability to make significant improvements in function in a reasonable and predictable amount of time.  Equipment Recommendations       Recommendations for Other Services Rehab consult     Precautions / Restrictions Precautions Precautions: Fall Precaution Comments: BP parameters      Mobility Bed Mobility Overal bed mobility: Needs Assistance Bed Mobility: Rolling, Sidelying to Sit Rolling: Mod assist Sidelying to sit: Mod assist       General bed mobility comments: tactile cues for motor planning    Transfers Overall transfer level: Needs assistance Equipment used: 2 person hand held assist Transfers: Sit to/from Stand, Bed to chair/wheelchair/BSC Sit to Stand: Min assist, +2 physical assistance Stand pivot transfers: Mod assist, +2 physical assistance                Balance Overall balance assessment: Needs assistance   Sitting balance-Leahy Scale: Poor Sitting balance - Comments: leaning R at times     Standing balance-Leahy Scale: Poor                             ADL either performed or assessed with clinical judgement   ADL Overall ADL's : Needs assistance/impaired Eating/Feeding: NPO   Grooming: Maximal assistance   Upper Body Bathing: Maximal assistance   Lower Body Bathing: Maximal assistance   Upper Body Dressing : Maximal assistance   Lower Body Dressing: Total assistance   Toilet Transfer: Moderate assistance;+2 for physical assistance (simulated)   Toileting- Clothing Manipulation and Hygiene: Total assistance       Functional mobility during ADLs: Moderate assistance;+2 for physical assistance (+2 HHA)       Vision   Vision Assessment?:  Vision impaired- to be further tested in functional context Additional Comments: ? R field cut; sister says "he has problems with his vision but doesn't go to the eye doctor"     Perception Perception Comments: R inattention; will further assess    Praxis Praxis Praxis-Other Comments: unable to comb hari on commadn; hand over hand however pt did not continue; difficulty washing face - put cloth to face and kept it there    Pertinent Vitals/Pain Pain Assessment Pain Assessment: Faces Faces Pain Scale: No hurt     Hand Dominance Right   Extremity/Trunk Assessment Upper Extremity Assessment Upper Extremity Assessment: RUE deficits/detail RUE Deficits / Details: no active movement observed; edematous hand RUE Coordination: decreased fine motor;decreased gross motor (non-functional)   Lower Extremity Assessment Lower Extremity Assessment: Defer to PT evaluation   Cervical / Trunk Assessment Cervical / Trunk Assessment: Other exceptions (R bias)   Communication Communication Communication: HOH (no hearing aids)   Cognition Arousal/Alertness: Awake/alert Behavior During Therapy: Flat affect, Impulsive Overall Cognitive Status: Difficult to assess                                 General Comments: very HOH; appears apraxia; grunting at times but non-verbal     General Comments  skin abrasions B knees    Exercises     Shoulder Instructions      Home Living Family/patient expects to be discharged to:: Private residence Living Arrangements: Other relatives Available Help at Discharge: Family;Available 24 hours/day Type of Home: Mobile home Home Access: Stairs to enter Entrance Stairs-Number of Steps: 6 Entrance Stairs-Rails: Right;Left Home Layout: One level     Bathroom Shower/Tub: IT trainer: Standard Bathroom Accessibility: Yes How Accessible: Accessible via walker Home Equipment: Rolling Walker (2 wheels);Shower seat          Prior Functioning/Environment Prior Level of Function : Driving;Independent/Modified Independent             Mobility Comments: "walks with a limp" per sister ADLs Comments: Per sister was having more difficulty with finances,  medicaitons, etc for the past 3 months; made numerous mistakes paying bills"        OT Problem List: Decreased strength;Decreased range of motion;Decreased activity tolerance;Impaired balance (sitting and/or standing);Impaired vision/perception;Decreased coordination;Decreased knowledge of use of DME or AE;Decreased safety awareness;Decreased cognition;Decreased knowledge of precautions;Impaired sensation;Impaired tone;Impaired UE functional use;Increased edema      OT Treatment/Interventions: Self-care/ADL training;Therapeutic exercise;Neuromuscular education;DME and/or AE instruction;Therapeutic activities;Splinting;Cognitive remediation/compensation;Visual/perceptual remediation/compensation;Patient/family education;Balance training    OT Goals(Current goals can be found in the care plan section) Acute Rehab OT Goals Patient Stated Goal: per sister for her brother to get better OT Goal Formulation: With family Time For Goal Achievement: 01/30/22 Potential to Achieve Goals: Good  OT Frequency: Min 2X/week    Co-evaluation PT/OT/SLP Co-Evaluation/Treatment: Yes Reason for Co-Treatment: Complexity of the patient's impairments (multi-system involvement);To address functional/ADL transfers   OT goals addressed during session: ADL's and self-care      AM-PAC OT "6 Clicks" Daily Activity     Outcome Measure Help from another person eating meals?: Total Help from another person taking care of personal grooming?: A Lot Help from another person toileting, which includes using toliet, bedpan, or urinal?: Total Help from another person bathing (including washing, rinsing, drying)?: A Lot Help from another person to put on and taking off regular upper body clothing?: A Lot Help from another person to  put on and taking off regular lower body clothing?: A Lot 6 Click Score: 10   End of Session Equipment Utilized During Treatment: Gait belt Nurse Communication: Mobility status  Activity  Tolerance: Patient tolerated treatment well Patient left: in chair;with call bell/phone within reach;with chair alarm set  OT Visit Diagnosis: Unsteadiness on feet (R26.81);Other abnormalities of gait and mobility (R26.89);Muscle weakness (generalized) (M62.81);History of falling (Z91.81);Other symptoms and signs involving cognitive function;Other symptoms and signs involving the nervous system (R29.898);Cognitive communication deficit (R41.841);Hemiplegia and hemiparesis Symptoms and signs involving cognitive functions: Cerebral infarction Hemiplegia - Right/Left: Right Hemiplegia - dominant/non-dominant: Dominant Hemiplegia - caused by: Cerebral infarction                Time: 1029-1105 OT Time Calculation (min): 36 min Charges:  OT General Charges $OT Visit: 1 Visit OT Evaluation $OT Eval Moderate Complexity: 1 Mod  Nalanie Winiecki, OT/L   Acute OT Clinical Specialist Acute Rehabilitation Services Pager [phone] Office [phone]   [facility] 01/16/2022, 12:41 PM

## 2022-01-16 NOTE — Evaluation (Signed)
Clinical/Bedside Swallow Evaluation Patient Details  Name: Casey Rangel MRN: [id number] Date of Birth: 1950/10/08  Today's Date: 01/16/2022 Time: SLP Start Time (ACUTE ONLY): 0943 SLP Stop Time (ACUTE ONLY): 0955 SLP Time Calculation (min) (ACUTE ONLY): 12 min  Past Medical History:  Past Medical History:  Diagnosis Date   GERD (gastroesophageal reflux disease)    Hiatal hernia    Past Surgical History:  Past Surgical History:  Procedure Laterality Date   INTRAMEDULLARY (IM) NAIL INTERTROCHANTERIC Left 10/17/2020   Procedure: INTRAMEDULLARY (IM) NAIL INTERTROCHANTRIC;  Surgeon: Kennedy Bucker, MD;  Location: [facility] ORS;  Service: Orthopedics;  Laterality: Left;   IR CT HEAD LTD  01/15/2022   IR PERCUTANEOUS ART THROMBECTOMY/INFUSION INTRACRANIAL INC DIAG ANGIO  01/15/2022   IR PTA INTRACRANIAL  01/15/2022   IR US GUIDE VASC ACCESS RIGHT  01/15/2022   T9-T11 fusion in 1990     HPI:  Pt is a 71 y.o. male who presented with aphasia and right-sided weakness. MRI brain 5/31: Patchy acute ischemic nonhemorrhagic left MCA distribution  infarct, watershed in distribution. 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 Pt s/p angiography, thrombectomy and angioplasty 5/31. PMH: avoidance of healthcare, left hip fracture, intermittent memory impairment.    Assessment / Plan / Recommendation  Clinical Impression  Pt was seen for bedside swallow evaluation.  Oral mechanism exam was limited due to pt's difficulty following commands. Oral inspection revealed adequate, natural dentition. He demonstrated symptoms of oropharyngeal dysphagia characterized by prolonged mastication, mild oral residue, and inconsistent signs of aspiration with thin liquids. A modified barium swallow study is recommended to further assess swallow function; scheduled for today at 1230. Pt may have floor-stock purees and ice chips until it is completed and meds may be crushed and given in puree. SLP Visit  Diagnosis: Dysphagia, unspecified (R13.10)    Aspiration Risk  Mild aspiration risk    Diet Recommendation NPO (pt may have ice chips and floor-stock purees until MBS)   Medication Administration: Crushed with puree    Other  Recommendations Oral Care Recommendations: Oral care BID    Recommendations for follow up therapy are one component of a multi-disciplinary discharge planning process, led by the attending physician.  Recommendations may be updated based on patient status, additional functional criteria and insurance authorization.  Follow up Recommendations  (continued SLP services at level of care recommended by PT/OT)      Assistance Recommended at Discharge    Functional Status Assessment Patient has had a recent decline in their functional status and demonstrates the ability to make significant improvements in function in a reasonable and predictable amount of time.  Frequency and Duration min 2x/week  2 weeks       Prognosis Prognosis for Safe Diet Advancement: Good Barriers to Reach Goals: Language deficits      Swallow Study   General Date of Onset: 01/15/22 HPI: Pt is a 71 y.o. male who presented with aphasia and right-sided weakness. MRI brain 5/31: Patchy acute ischemic nonhemorrhagic left MCA distribution  infarct, watershed in distribution. 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 Pt s/p angiography, thrombectomy and angioplasty 5/31. PMH: avoidance of healthcare, left hip fracture, intermittent memory impairment. Type of Study: Bedside Swallow Evaluation Previous Swallow Assessment: none Diet Prior to this Study: NPO Temperature Spikes Noted: No Respiratory Status: Nasal cannula History of Recent Intubation: No Behavior/Cognition: Alert;Cooperative;Pleasant mood Oral Cavity Assessment: Within Functional Limits Oral Care Completed by SLP: No Oral Cavity - Dentition:  Adequate natural dentition Vision: Functional for  self-feeding Self-Feeding Abilities: Needs assist Patient Positioning: Upright in bed;Postural control adequate for testing Baseline Vocal Quality: Normal Volitional Cough: Cognitively unable to elicit Volitional Swallow: Unable to elicit    Oral/Motor/Sensory Function Overall Oral Motor/Sensory Function:  (difficult to assess)   Ice Chips Ice chips: Within functional limits Presentation: Spoon   Thin Liquid Thin Liquid: Impaired Presentation: Straw;Cup Pharyngeal  Phase Impairments: Cough - Immediate;Throat Clearing - Immediate    Nectar Thick Nectar Thick Liquid: Not tested   Honey Thick Honey Thick Liquid: Not tested   Puree Puree: Within functional limits Presentation: Spoon   Solid     Solid: Impaired Oral Phase Impairments: Impaired mastication Oral Phase Functional Implications: Impaired mastication;Oral residue     Bret Vanessen I. Vear Clock, MS, CCC-SLP Acute Rehabilitation Services Office number [phone] Pager [phone]  Scheryl Marten 01/16/2022,10:14 AM

## 2022-01-16 NOTE — Progress Notes (Signed)
2D Echo attempted at 1:40, patient is off the floor at this time. Will re-attempt as schedule permits.  [facility] Lynleigh Kovack RDCS

## 2022-01-16 NOTE — Evaluation (Signed)
Physical Therapy Evaluation Patient Details Name: Casey Rangel MRN: [id number] DOB: May 26, 1951 Today's Date: 01/16/2022  History of Present Illness  71 yo admitted with acute R sided weakness and aphasia, found down non-responsive by his sister. 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 MRI pending. PMH L hip fx ; T9-11 fusion  Clinical Impression  Pt admitted with above diagnosis.  OT called sister to get PLOF. PTA pt lives with independently with his sister, drives and is responsible for his medication and financial management. Sister reports increased difficulty with  IADL tasks over the last 3 months, making several financial errors, ordering a computer x 2 due to recent difficulty with memory. 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 Pt nonverbal during session, however attempting to vocalize at times and  leaning towards PT and OT at times as if he could not hear. Able to progress to EOB with mod A then Mod A +2 for safety to stand step to chair. Pt with apparent apraxia, requiring Max A for tasks.    Pt currently with functional limitations due to the deficits listed below (see PT Problem List). Pt will benefit from skilled PT to increase their independence and safety with mobility to allow discharge to the venue listed below.        Recommendations for follow up therapy are one component of a multi-disciplinary discharge planning process, led by the attending physician.  Recommendations may be updated based on patient status, additional functional criteria and insurance authorization.  Follow Up Recommendations Acute inpatient rehab (3hours/day)    Assistance Recommended at Discharge Frequent or constant Supervision/Assistance  Patient can return home with the following  A lot of help with walking and/or transfers;A lot of help with bathing/dressing/bathroom;Assistance with cooking/housework;Assist for transportation;Help with stairs or ramp for entrance;Direct  supervision/assist for medications management;Direct supervision/assist for financial management    Equipment Recommendations Other (comment) (TBA at next venue)  Recommendations for Other Services  Rehab consult    Functional Status Assessment Patient has had a recent decline in their functional status and demonstrates the ability to make significant improvements in function in a reasonable and predictable amount of time.     Precautions / Restrictions Precautions Precautions: Fall Precaution Comments: BP parameters Restrictions Weight Bearing Restrictions: No      Mobility  Bed Mobility Overal bed mobility: Needs Assistance Bed Mobility: Rolling, Sidelying to Sit Rolling: Mod assist Sidelying to sit: Mod assist       General bed mobility comments: tactile cues for motor planning    Transfers Overall transfer level: Needs assistance Equipment used: 2 person hand held assist Transfers: Sit to/from Stand, Bed to chair/wheelchair/BSC Sit to Stand: Min assist, +2 physical assistance Stand pivot transfers: Mod assist, +2 physical assistance         General transfer comment: Pt needed min assist of 2 to rise and mod assist to take pivotal steps to chair.  Pt needed assist and cues for weight shifting and sequencing steps with bil HHA.    Ambulation/Gait                  Stairs            Wheelchair Mobility    Modified Rankin (Stroke Patients Only)       Balance Overall balance assessment: Needs assistance Sitting-balance support: Bilateral upper extremity supported, Feet supported Sitting balance-Leahy Scale: Poor Sitting balance - Comments: leaning R at times   Standing balance support: Bilateral upper extremity supported, During  functional activity Standing balance-Leahy Scale: Poor Standing balance comment: relies on UE support of therapists to stand statically and dynamically.                             Pertinent Vitals/Pain  Pain Assessment Pain Assessment: No/denies pain Faces Pain Scale: No hurt    Home Living Family/patient expects to be discharged to:: Private residence Living Arrangements: Other relatives Available Help at Discharge: Family;Available 24 hours/day Type of Home: Mobile home Home Access: Stairs to enter Entrance Stairs-Rails: Right;Left Entrance Stairs-Number of Steps: 6   Home Layout: One level Home Equipment: Agricultural consultant (2 wheels);Shower seat      Prior Function Prior Level of Function : Driving;Independent/Modified Independent             Mobility Comments: "walks with a limp" per sister ADLs Comments: Per sister was having more difficulty with finances, medicaitons, etc for the past 3 months; made numerous mistakes paying bills"     Hand Dominance   Dominant Hand: Right    Extremity/Trunk Assessment   Upper Extremity Assessment Upper Extremity Assessment: RUE deficits/detail RUE Deficits / Details: no active movement observed; edematous hand RUE Coordination: decreased fine motor;decreased gross motor (non-functional)    Lower Extremity Assessment Lower Extremity Assessment: RLE deficits/detail RLE Deficits / Details: no movement to command but appears to have some active movement grossly 2-/5    Cervical / Trunk Assessment Cervical / Trunk Assessment: Other exceptions (R bias)  Communication   Communication: HOH (no hearing aids)  Cognition Arousal/Alertness: Awake/alert Behavior During Therapy: Flat affect, Impulsive Overall Cognitive Status: Difficult to assess                                 General Comments: very HOH; appears apraxia; grunting at times but non-verbal        General Comments General comments (skin integrity, edema, etc.): skin abrasions B knees, VSS on RA with activity    Exercises     Assessment/Plan    PT Assessment Patient needs continued PT services  PT Problem List Decreased activity tolerance;Decreased  balance;Decreased mobility;Decreased strength;Decreased coordination;Decreased knowledge of use of DME;Decreased safety awareness;Decreased knowledge of precautions;Decreased cognition       PT Treatment Interventions DME instruction;Gait training;Functional mobility training;Therapeutic activities;Therapeutic exercise;Balance training;Patient/family education    PT Goals (Current goals can be found in the Care Plan section)  Acute Rehab PT Goals Patient Stated Goal: unable to state due to aphasia PT Goal Formulation: With patient Time For Goal Achievement: 01/30/22 Potential to Achieve Goals: Good    Frequency Min 4X/week     Co-evaluation PT/OT/SLP Co-Evaluation/Treatment: Yes Reason for Co-Treatment: Complexity of the patient's impairments (multi-system involvement);For patient/therapist safety PT goals addressed during session: Mobility/safety with mobility OT goals addressed during session: ADL's and self-care       AM-PAC PT "6 Clicks" Mobility  Outcome Measure Help needed turning from your back to your side while in a flat bed without using bedrails?: A Lot Help needed moving from lying on your back to sitting on the side of a flat bed without using bedrails?: A Lot Help needed moving to and from a bed to a chair (including a wheelchair)?: Total Help needed standing up from a chair using your arms (e.g., wheelchair or bedside chair)?: Total Help needed to walk in hospital room?: Total Help needed climbing 3-5 steps with a railing? :  Total 6 Click Score: 8    End of Session Equipment Utilized During Treatment: Gait belt Activity Tolerance: Patient limited by fatigue Patient left: in chair;with chair alarm set;with call bell/phone within reach Nurse Communication: Mobility status PT Visit Diagnosis: Unsteadiness on feet (R26.81);Muscle weakness (generalized) (M62.81);Hemiplegia and hemiparesis Hemiplegia - Right/Left: Right Hemiplegia - dominant/non-dominant:  Dominant Hemiplegia - caused by: Cerebral infarction    Time: 7829-5621 PT Time Calculation (min) (ACUTE ONLY): 30 min   Charges:   PT Evaluation $PT Eval Moderate Complexity: 1 Mod          Ghislaine Harcum M,PT Acute Rehab Services [phone] (pager)   Bevelyn Buckles 01/16/2022, 2:20 PM

## 2022-01-16 NOTE — Progress Notes (Signed)
   Inpatient Rehab Admissions Coordinator :  Per OT recommendations, patient was screened for CIR candidacy by Issaac Shipper RN MSN.  At this time patient appears to be a potential candidate for CIR. I will place a rehab consult per protocol for full assessment. Please call me with any questions.  Morry Veiga RN MSN Admissions Coordinator [phone]   

## 2022-01-16 NOTE — Progress Notes (Addendum)
STROKE TEAM PROGRESS NOTE   INTERVAL HISTORY No family at the bedside. MRI planned for today. SLP eval pending, failed [facility] swallow screen. Remains on low dose of cleviprex  Update- SLP cleared him for meds whole with thins, will start PO meds and taper cleviprex.   Vitals:   01/16/22 0500 01/16/22 0600 01/16/22 0700 01/16/22 0800  BP: 123/69 117/71 119/74 115/73  Pulse: (!) 102 100 94 95  Resp: 20 16 (!) 21 (!) 27  Temp:      TempSrc:      SpO2: 92% 95% 92% 94%  Weight:      Height:       CBC:  Recent Labs  Lab 01/15/22 1908 01/15/22 1914 01/16/22 0449  WBC 16.3*  --  11.2*  NEUTROABS 13.9*  --   --   HGB 16.4 16.3 15.1  HCT 49.3 48.0 44.3  MCV 94.6  --  89.7  PLT 277  --  177   Basic Metabolic Panel:  Recent Labs  Lab 01/15/22 1908 01/15/22 1914 01/16/22 0449  NA 141 143 139  K 4.1 3.8 4.6  CL 112* 112* 114*  CO2 16*  --  13*  GLUCOSE 102* 106* 127*  BUN 28* 34* 19  CREATININE 0.98 0.80 0.85  CALCIUM 9.1  --  8.2*   Lipid Panel:  Recent Labs  Lab 01/16/22 0449  CHOL 178  TRIG 43  HDL 40*  CHOLHDL 4.5  VLDL 9  LDLCALC 129*   HgbA1c:  Recent Labs  Lab 01/16/22 0449  HGBA1C 5.1   Urine Drug Screen:  Recent Labs  Lab 01/15/22 1908  LABOPIA NONE DETECTED  COCAINSCRNUR NONE DETECTED  LABBENZ NONE DETECTED  AMPHETMU NONE DETECTED  THCU NONE DETECTED  LABBARB NONE DETECTED    Alcohol Level  Recent Labs  Lab 01/15/22 1930  ETH <10    IMAGING past 24 hours MR BRAIN WO CONTRAST  Result Date: 01/15/2022 CLINICAL DATA:  Initial evaluation for acute neuro deficit, stroke. EXAM: MRI HEAD WITHOUT CONTRAST TECHNIQUE: Multiplanar, multiecho pulse sequences of the brain and surrounding structures were obtained without intravenous contrast. COMPARISON:  Prior CTs from earlier the same day. FINDINGS: Brain: Examination degraded by motion artifact. Generalized age-related cerebral atrophy. Moderately advanced chronic microvascular ischemic disease seen  involving the supratentorial cerebral white matter. Multiple scatter remote cortical to subcortical infarcts noted involving the right frontal and parietal lobes, left parietal lobe, and right occipital lobe. Small remote right cerebellar infarct. Multiple remote lacunar infarcts present about the deep gray nuclei/corona radiata. Mild scattered chronic hemosiderin staining noted about a few of these chronic infarcts. Patchy restricted diffusion involving the cortical and subcortical aspect of the left frontal, parietal, and temporal lobes, consistent with acute left MCA distribution infarct. 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 No associated hemorrhage or mass effect. No other evidence for acute or subacute ischemia. No acute intracranial hemorrhage. No mass lesion, mass effect or midline shift. Previously noted finding at the fourth ventricular outflow tract on prior CT most likely reflects cord plexus. No hydrocephalus or extra-axial fluid collection. Pituitary gland suprasellar region within normal limits. Vascular: Major intracranial vascular flow voids are maintained. Skull and upper cervical spine: Craniocervical junction within normal limits. Bone marrow signal intensity normal. No scalp soft tissue abnormality. Sinuses/Orbits: Globes and orbital soft tissues demonstrate no acute finding. Scattered mucosal thickening noted throughout the paranasal sinuses. Trace right mastoid effusion, of doubtful  significance. Other: None. IMPRESSION: 1. 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 No associated hemorrhage or significant mass effect. 2. No other acute intracranial abnormality. 3. Underlying age-related cerebral atrophy with extensive chronic ischemic  changes as above. Electronically Signed   By: Rise Mu M.D.   On: 01/15/2022 21:07   CT C-SPINE NO CHARGE  Result Date: 01/15/2022 CLINICAL DATA:  Initial evaluation for acute stroke. EXAM: CT ANGIOGRAPHY HEAD AND NECK CT PERFUSION BRAIN CT CERVICAL SPINE WITHOUT CONTRAST TECHNIQUE: Multidetector CT imaging of the head and neck was performed using the standard protocol during bolus administration of intravenous contrast. Multiplanar CT image reconstructions and MIPs were obtained to evaluate the vascular anatomy. Carotid stenosis measurements (when applicable) are obtained utilizing NASCET criteria, using the distal internal carotid diameter as the denominator. Multiphase CT imaging of the brain was performed following IV bolus contrast injection. Subsequent parametric perfusion maps were calculated using RAPID software. RADIATION DOSE REDUCTION: This exam was performed according to the departmental dose-optimization program which includes automated exposure control, adjustment of the mA and/or kV according to patient size and/or use of iterative reconstruction technique. CONTRAST:  OMNIPAQUE IOHEXOL 350 MG/ML SOLN COMPARISON:  Prior CT from earlier the same day. FINDINGS: CTA NECK FINDINGS Aortic arch: Visualized aortic arch normal caliber with standard branching pattern. Moderately advanced atheromatous change about the arch and origin of the great vessels with associated intimal irregularity. Small amount of thrombus protrudes into the lumen of the proximal left subclavian artery (series 8, image 327). No high-grade stenosis about the origin the great vessels. Right carotid system: Right common and internal carotid arteries are tortuous. Prominent mixed plaque about the right carotid bulb/proximal right ICA without hemodynamically significant greater than 50% stenosis. Right ICA patent distally without stenosis or dissection. Left carotid system: Left common and internal carotid arteries are  tortuous. Prominent mixed plaque about the left carotid bulb/proximal left ICA without hemodynamically significant stenosis. Left ICA patent distally without stenosis or dissection. Vertebral arteries: Both vertebral arteries arise from the subclavian arteries. Left vertebral artery dominant. No significant proximal subclavian artery stenosis. Vertebral arteries tortuous but patent without stenosis or dissection. Skeleton: No acute fracture within the cervical spine. No discrete or worrisome osseous lesions. Mild degenerative spondylosis present at C5-6 and C6-7. Other neck: No other acute soft tissue abnormality within the neck. Upper chest: Visualized upper chest demonstrates no acute finding. Centrilobular emphysema noted. Review of the MIP images confirms the above findings CTA HEAD FINDINGS Anterior circulation: Petrous segments patent bilaterally. Atheromatous change within the carotid siphons without hemodynamically significant stenosis. 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 A radiographic string sign is present. While this finding is somewhat age indeterminate, subtle surrounding soft tissue attenuation suggest at there is likely an acute component. Left M1 segment patent distally. No visible downstream left MCA branch occlusion. Right M1 segment and distal right MCA branches are widely patent. Posterior circulation: Atheromatous irregularity within the dominant left V4 segment without significant stenosis. Left PICA patent. Right vertebral artery largely terminates in PICA. Right PICA patent as well. Hypoplastic right V4 segment largely occludes beyond the takeoff of the right PICA. Basilar irregular and somewhat diminutive but patent to its distal aspect. Superior cerebral arteries patent bilaterally. Predominant fetal type origin of the PCAs. Both PCAs irregular but patent to their distal aspects  without high-grade stenosis. Venous sinuses: Grossly  patent allowing for timing the contrast bolus. Anatomic variants: As above.  No aneurysm. Review of the MIP images confirms the above findings CT Brain Perfusion Findings: ASPECTS: 10. CBF (<30%) Volume: 17mL Perfusion (Tmax>6.0s) volume: Mismatch Volume: Infarction Location:No evidence for acute core infarct by CT perfusion. Large 190 cc perfusion deficit within the left MCA distribution related to the above described severe stenosis/near occlusion at the left ICA terminus. IMPRESSION: 1. 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 While this finding is somewhat age indeterminate, subtle surrounding soft tissue attenuation suggests that there is likely an acute component. No visible downstream left MCA embolic occlusion. 2. 190 mL perfusion deficit within the left MCA distribution related to the above described severe stenosis/near occlusion at the left ICA terminus. No acute core infarct by CT perfusion. 3. Moderate atheromatous disease elsewhere about the major arterial vasculature of the head and neck. No other proximal high-grade or correctable stenosis. 4. No acute traumatic injury within the cervical spine. 5. Aortic Atherosclerosis (ICD10-I70.0) and Emphysema (ICD10-J43.9). Critical Value/emergent results were called by telephone at the time of interpretation on 01/15/2022 at 7:40 p.m. to provider Dr. Iver Nestle, who verbally acknowledged these results. Electronically Signed   By: Rise Mu M.D.   On: 01/15/2022 20:36   DG CHEST PORT 1 VIEW  Result Date: 01/16/2022 CLINICAL DATA:  Hypoxia, altered mental status. EXAM: PORTABLE CHEST 1 VIEW COMPARISON:  Chest x-ray 10/17/2020 FINDINGS: Aorta is ectatic in the heart is mildly enlarged, unchanged. The lungs are clear. There is no pleural effusion or pneumothorax. No acute fractures are seen. IMPRESSION: 1. No acute cardiopulmonary process. No significant interval change. Electronically  Signed   By: Darliss Cheney M.D.   On: 01/16/2022 00:45   CT HEAD CODE STROKE WO CONTRAST  Result Date: 01/15/2022 CLINICAL DATA:  Code stroke. EXAM: CT HEAD WITHOUT CONTRAST TECHNIQUE: Contiguous axial images were obtained from the base of the skull through the vertex without intravenous contrast. RADIATION DOSE REDUCTION: This exam was performed according to the departmental dose-optimization program which includes automated exposure control, adjustment of the mA and/or kV according to patient size and/or use of iterative reconstruction technique. COMPARISON:  None available. FINDINGS: Brain: Generalized age-related cerebral atrophy with moderately advanced chronic microvascular ischemic disease. Several remote lacunar infarcts present about the bilateral basal ganglia and thalami. Multifocal areas of encephalomalacia involving the right parietal and occipital lobes as well as the left temporal occipital region, consistent with chronic ischemic infarcts. Small remote right cerebellar infarct. No acute intracranial hemorrhage. Note made of a 7 mm partially calcified lesion at the fourth ventricular outflow tract (series 2, image 7), indeterminate, but favored to be vascular in nature, possibly reflecting aneurysm. No other mass lesion, mass effect or midline shift. No hydrocephalus or extra-axial fluid collection. Vascular: No hyperdense vessel. 7 mm fourth ventricular lesion as above. Calcified atherosclerosis present at skull base. Skull: Scalp soft tissues and calvarium within normal limits. Sinuses/Orbits: Left gaze noted. Scattered mucosal thickening noted about the ethmoidal air cells and maxillary sinuses. Mastoid air cells are clear. Other: None. ASPECTS [facility] Stroke Program Early CT Score) - Ganglionic level infarction (caudate, lentiform nuclei, internal capsule, insula, M1-M3 cortex): 7 - Supraganglionic infarction (M4-M6 cortex): 3 Total score (0-10 with 10 being normal): 10 IMPRESSION: 1. No  acute intracranial abnormality. 2. ASPECTS is 10. 3. Atrophy with moderate chronic small vessel ischemic disease with multiple remote ischemic  infarcts as above. 4. Approximate 7 mm lesion at the level of the fourth ventricular outflow tract, indeterminate, but favored to be vascular in nature, potentially reflecting aneurysm. These results were communicated to Dr. Iver Nestle at 7:19 pm on 01/15/2022 by text page via the [facility] messaging system. Electronically Signed   By: Rise Mu M.D.   On: 01/15/2022 19:23   CT ANGIO HEAD NECK W WO CM W PERF (CODE STROKE)  Result Date: 01/15/2022 CLINICAL DATA:  Initial evaluation for acute stroke. EXAM: CT ANGIOGRAPHY HEAD AND NECK CT PERFUSION BRAIN CT CERVICAL SPINE WITHOUT CONTRAST TECHNIQUE: Multidetector CT imaging of the head and neck was performed using the standard protocol during bolus administration of intravenous contrast. Multiplanar CT image reconstructions and MIPs were obtained to evaluate the vascular anatomy. Carotid stenosis measurements (when applicable) are obtained utilizing NASCET criteria, using the distal internal carotid diameter as the denominator. Multiphase CT imaging of the brain was performed following IV bolus contrast injection. Subsequent parametric perfusion maps were calculated using RAPID software. RADIATION DOSE REDUCTION: This exam was performed according to the departmental dose-optimization program which includes automated exposure control, adjustment of the mA and/or kV according to patient size and/or use of iterative reconstruction technique. CONTRAST:  OMNIPAQUE IOHEXOL 350 MG/ML SOLN COMPARISON:  Prior CT from earlier the same day. FINDINGS: CTA NECK FINDINGS Aortic arch: Visualized aortic arch normal caliber with standard branching pattern. Moderately advanced atheromatous change about the arch and origin of the great vessels with associated intimal irregularity. Small amount of thrombus protrudes into the lumen of  the proximal left subclavian artery (series 8, image 327). No high-grade stenosis about the origin the great vessels. Right carotid system: Right common and internal carotid arteries are tortuous. Prominent mixed plaque about the right carotid bulb/proximal right ICA without hemodynamically significant greater than 50% stenosis. Right ICA patent distally without stenosis or dissection. Left carotid system: Left common and internal carotid arteries are tortuous. Prominent mixed plaque about the left carotid bulb/proximal left ICA without hemodynamically significant stenosis. Left ICA patent distally without stenosis or dissection. Vertebral arteries: Both vertebral arteries arise from the subclavian arteries. Left vertebral artery dominant. No significant proximal subclavian artery stenosis. Vertebral arteries tortuous but patent without stenosis or dissection. Skeleton: No acute fracture within the cervical spine. No discrete or worrisome osseous lesions. Mild degenerative spondylosis present at C5-6 and C6-7. Other neck: No other acute soft tissue abnormality within the neck. Upper chest: Visualized upper chest demonstrates no acute finding. Centrilobular emphysema noted. Review of the MIP images confirms the above findings CTA HEAD FINDINGS Anterior circulation: Petrous segments patent bilaterally. Atheromatous change within the carotid siphons without hemodynamically significant stenosis. 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 A radiographic string sign is present. While this finding is somewhat age indeterminate, subtle surrounding soft tissue attenuation suggest at there is likely an acute component. Left M1 segment patent distally. No visible downstream left MCA branch occlusion. Right M1 segment and distal right MCA branches are widely patent. Posterior circulation: Atheromatous irregularity within the dominant left V4 segment without significant stenosis.  Left PICA patent. Right vertebral artery largely terminates in PICA. Right PICA patent as well. Hypoplastic right V4 segment largely occludes beyond the takeoff of the right PICA. Basilar irregular and somewhat diminutive but patent to its distal aspect. Superior cerebral arteries patent bilaterally. Predominant fetal type origin of the PCAs. Both PCAs irregular but patent to their distal aspects without high-grade stenosis.  Venous sinuses: Grossly patent allowing for timing the contrast bolus. Anatomic variants: As above.  No aneurysm. Review of the MIP images confirms the above findings CT Brain Perfusion Findings: ASPECTS: 10. CBF (<30%) Volume: 0mL Perfusion (Tmax>6.0s) volume: Mismatch Volume: Infarction Location:No evidence for acute core infarct by CT perfusion. Large 190 cc perfusion deficit within the left MCA distribution related to the above described severe stenosis/near occlusion at the left ICA terminus. IMPRESSION: 1. 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 While this finding is somewhat age indeterminate, subtle surrounding soft tissue attenuation suggests that there is likely an acute component. No visible downstream left MCA embolic occlusion. 2. 190 mL perfusion deficit within the left MCA distribution related to the above described severe stenosis/near occlusion at the left ICA terminus. No acute core infarct by CT perfusion. 3. Moderate atheromatous disease elsewhere about the major arterial vasculature of the head and neck. No other proximal high-grade or correctable stenosis. 4. No acute traumatic injury within the cervical spine. 5. Aortic Atherosclerosis (ICD10-I70.0) and Emphysema (ICD10-J43.9). Critical Value/emergent results were called by telephone at the time of interpretation on 01/15/2022 at 7:40 p.m. to provider Dr. Iver Nestle, who verbally acknowledged these results. Electronically Signed   By: Rise Mu  M.D.   On: 01/15/2022 20:36    PHYSICAL EXAM  Physical Exam  Constitutional: Appears well-developed and well-nourished.  Cardiovascular: Normal rate and regular rhythm.  Respiratory: Effort normal, labored breathing on 4L [place]  Neuro: Mental Status: Patient is awake, alert globally aphasic  Cranial Nerves: II: Visual Fields are full. Pupils are equal, round, and reactive to light.   III,IV, VI: EOMI without ptosis or diploplia.  V: Facial sensation is symmetric to temperature VII: Facial movement is symmetric resting and smiling VIII: Hearing is intact to voice X: Palate elevates symmetrically XI: Shoulder shrug strong on the left XII:  Motor: Tone is normal. Bulk is normal.  RUE 0/5 LUE 5/5 RLE 3/5 LLE 5/5 Sensory: Sensation is symmetric to light touch and temperature in the arms and legs. No extinction to DSS present.  Withdrawal to pain in all extremities Cerebellar: Unable to participate, no ataxia noted in left upper or lower extremity   ASSESSMENT/PLAN [name] is a 71 y.o. male with history of GERD, left hip fracture presenting with global aphasia and right sided weakness and 3 days of increased confusion. Taken for a mechanical thrombectomy and angioplasty. Start DAPT therapy with ASA  and plavix  after MRI. Passed MBS.   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 Cardioembolic source  Code Stroke CT head No acute abnormality. old infarct left parieto-occipital, right PCA, right MCA/ACA  CTA head & neck severe near occlusive stenosis at the Lt ICA terminus with radiographic string sign.  CT perfusion 190 mL perfusion deficit within the left MCA distribution  MRI  patchy watershed distrubution stroke in left MCA territory S/p IR with mechanical thrombectomy and angioplasty of left ICA terminus MRA  The previously demonstrated high-grade stenosis of the  supraclinoid left ICA is no longer appreciated. 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 2D Echo EF 35 to 40% LE venous Doppler pending We will consider 30-day CardioNet monitoring as outpatient to rule out A-fib LDL 129 HgbA1c 5.1 VTE prophylaxis -Lovenox No antithrombotic prior to admission, ASA  and Plavix  DAPT for 3 weeks and  then ASA alone.  Therapy recommendations: CIR Disposition:  pending  Cardiomyopathy 2D echo EF 35 to 40% No prior records to compare Given current stroke, will needed cardiology consult  Hypertension BP goal 120-140 for 24 hours post catheterization Long-term BP goal normotensive On Cleviprex, tape off as able Start norvasc 5mg   Hyperlipidemia Home meds:  None LDL 129, goal < 70 Add crestor 20mg  Continue statin at discharge  Other Stroke Risk Factors Advanced Age >/= 1665   Other Active Problems Dysphagia - now pass swallow, on dys2 and thin Acute hypoxic respiratory insufficiency  On 4LNC SLP to evaluate for aspiration  Nebs ordered  Hospital day # 1  Patient seen and examined by NP/APP with MD. MD to update note as needed.   Elmer Pickerevon Shafer, DNP, FNP-BC [facility] Pager: [phone]  ATTENDING NOTE: I reviewed above note and agree with the assessment and plan. Pt was seen and examined.   71 year old male not regularly following up medical care with history of left hip fracture in 10/2021, syncope admitted for right-sided weakness, aphasia and found down.  CT no acute abnormality, old infarct left parieto-occipital, right PCA, right MCA/ACA.  CTA head and neck showed left ICA terminus occlusion versus string sign, bilateral fetal PCAs.  CT perfusion 0/190.  MRI showed left MCA, MCA/PCA patchy infarcts.  Status post IR with TICI3 and angioplasty.  EF 35 to 40%.  LDL 129 and A1c 5.1, UDS negative.  Creatinine 0.80, WBC 16.3.  MRA post IR showed left terminal ICA occlusion/near occlusion resolved.  On  exam, patient awake, alert, eyes open, global aphasia, spontaneous words of "damn it" with painful stimulation of right UE, otherwise nonverbal. Not following all simple commands. Intermittent pantomime. No gaze palsy, tracking bilaterally, blinking to visual threat b/l. Slight right nasolabial fold flattening. Tongue protrusion not cooperative. RUE flaccid 0/5, LUE spontaneous movement against gravity. Bilaterally LEs 3/5, no drift. Sensation, coordination not cooperative and gait not tested.  Etiology for patient stroke likely due to large vessel disease of left ICA terminus atherosclerosis versus cardioembolic source given decreased LVEF.  Will recommend cardiology consult for cardiomyopathies, check LE venous Doppler, and recommend 30-day cardiac event monitor as outpatient.  Continue DAPT and statin.  Patient passed swallow, put on Norvasc low-dose to wean off Cleviprex.  DC IV fluid as patient eating okay.  For detailed assessment and plan, please refer to above as I have made changes wherever appropriate.   Marvel PlanJindong Latashia Koch, MD PhD Stroke Neurology 01/16/2022 6:23 PM  This patient is critically ill due to left MCA stroke, left terminal ICA occlusion/near occlusion status post mechanical thrombectomy and angioplasty, cardiomyopathy and at significant risk of neurological worsening, death form recurrent stroke, hemorrhagic transformation, heart failure, aspiration. This patient's care requires constant monitoring of vital signs, hemodynamics, respiratory and cardiac monitoring, review of multiple databases, neurological assessment, discussion with family, other specialists and medical decision making of high complexity. I spent 40 minutes of neurocritical care time in the care of this patient.    To contact Stroke Continuity provider, please refer to [email or web address]. After hours, contact General Neurology

## 2022-01-16 NOTE — Progress Notes (Signed)
Modified Barium Swallow Progress Note  Patient Details  Name: Casey Rangel MRN: [id number] Date of Birth: 08-26-1950  Today's Date: 01/16/2022  Modified Barium Swallow completed.  Full report located under Chart Review in the Imaging Section.  Brief recommendations include the following:  Clinical Impression  Pt presents with oropharyngeal dysphagia characterized by reduced bolus cohesion, weak lingual manipulation with solids, and a pharygeal delay. Pt exhibited difficulty with posterior propulsion of solids and lingual pumping was noted with solids. Mastication was prolonged and boluses of regular texture solids were held at the level of the oropharynx until verbal prompts were provided to swallow. He demonstrated premature spillage to the valleculae and pyriform sinuses with larger boluses of thin liquids which combined with the pharyngeal delay resulted in penetration (PAS 3). A dysphagia 2 diet with thin liquids is recommended at this time, and SLP will follow for dysphagia treatment.   Swallow Evaluation Recommendations       SLP Diet Recommendations: Dysphagia 2 (Fine chop) solids;Thin liquid   Liquid Administration via: Cup;Straw   Medication Administration: Whole meds with liquid   Supervision: Staff to assist with self feeding   Compensations: Slow rate;Small sips/bites;Follow solids with liquid   Postural Changes: Seated upright at 90 degrees         Waverly Tarquinio I. Vear Clock, MS, CCC-SLP Acute Rehabilitation Services Office number [phone] Pager [phone]    Scheryl Marten 01/16/2022,1:51 PM

## 2022-01-17 ENCOUNTER — Inpatient Hospital Stay (HOSPITAL_COMMUNITY): Payer: Medicare Other

## 2022-01-17 DIAGNOSIS — R410 Disorientation, unspecified: Secondary | ICD-10-CM

## 2022-01-17 DIAGNOSIS — I639 Cerebral infarction, unspecified: Secondary | ICD-10-CM

## 2022-01-17 LAB — BASIC METABOLIC PANEL
Anion gap: 7 (ref 5–15)
BUN: 17 mg/dL (ref 8–23)
CO2: 22 mmol/L (ref 22–32)
Calcium: 8.4 mg/dL — ABNORMAL LOW (ref 8.9–10.3)
Chloride: 110 mmol/L (ref 98–111)
Creatinine, Ser: 0.93 mg/dL (ref 0.61–1.24)
GFR, Estimated: >60 mL/min (ref >60)
Glucose, Bld: 90 mg/dL (ref 70–99)
Potassium: 3.7 mmol/L (ref 3.5–5.1)
Sodium: 139 mmol/L (ref 135–145)

## 2022-01-17 LAB — CBC
HCT: 41.3 % (ref 39.0–52.0)
Hemoglobin: 14 g/dL (ref 13.0–17.0)
MCH: 31.1 pg (ref 26.0–34.0)
MCHC: 33.9 g/dL (ref 30.0–36.0)
MCV: 91.8 fL (ref 80.0–100.0)
Platelets: 277 10*3/uL (ref 150–400)
RBC: 4.5 MIL/uL (ref 4.22–5.81)
RDW: 13.2 % (ref 11.5–15.5)
WBC: 12.8 10*3/uL — ABNORMAL HIGH (ref 4.0–10.5)
nRBC: 0 % (ref 0.0–0.2)

## 2022-01-17 LAB — GLUCOSE, CAPILLARY
Glucose-Capillary: 105 mg/dL — ABNORMAL HIGH (ref 70–99)
Glucose-Capillary: 105 mg/dL — ABNORMAL HIGH (ref 70–99)
Glucose-Capillary: 104 mg/dL — ABNORMAL HIGH (ref 70–99)
Glucose-Capillary: 119 mg/dL — ABNORMAL HIGH (ref 70–99)
Glucose-Capillary: 112 mg/dL — ABNORMAL HIGH (ref 70–99)
Glucose-Capillary: 111 mg/dL — ABNORMAL HIGH (ref 70–99)

## 2022-01-17 MED ORDER — HALOPERIDOL LACTATE 5 MG/ML IJ SOLN
INTRAMUSCULAR | Status: AC
  Filled 2022-01-17: qty 1

## 2022-01-17 MED ORDER — QUETIAPINE FUMARATE 25 MG PO TABS
25.0000 mg | ORAL_TABLET | Freq: Every day | ORAL | Status: DC
  Administered 2022-01-18 – 2022-01-20 (×3): 25 mg via ORAL
  Filled 2022-01-17 (×3): qty 1

## 2022-01-17 MED ORDER — INSULIN ASPART 100 UNIT/ML IJ SOLN
0.0000 [IU] | Freq: Three times a day (TID) | INTRAMUSCULAR | Status: DC
  Administered 2022-01-18: 1 [IU] via SUBCUTANEOUS
  Administered 2022-01-18: 2 [IU] via SUBCUTANEOUS
  Administered 2022-01-21: 1 [IU] via SUBCUTANEOUS

## 2022-01-17 MED ORDER — HALOPERIDOL LACTATE 5 MG/ML IJ SOLN
2.5000 mg | Freq: Four times a day (QID) | INTRAMUSCULAR | Status: DC | PRN
Start: 2022-01-17 — End: 2022-01-17
  Administered 2022-01-17: 2.5 mg via INTRAVENOUS

## 2022-01-17 MED ORDER — HALOPERIDOL LACTATE 5 MG/ML IJ SOLN: 5.0000 mg | Freq: Four times a day (QID) | INTRAMUSCULAR | Status: DC | PRN

## 2022-01-17 MED ORDER — QUETIAPINE FUMARATE 25 MG PO TABS: 25.0000 mg | ORAL_TABLET | Freq: Every day | ORAL | Status: DC

## 2022-01-17 MED ORDER — LABETALOL HCL 5 MG/ML IV SOLN: 5.0000 mg | INTRAVENOUS | Status: DC | PRN

## 2022-01-17 MED ORDER — HALOPERIDOL LACTATE 5 MG/ML IJ SOLN
2.0000 mg | Freq: Once | INTRAMUSCULAR | Status: AC
  Administered 2022-01-17: 2 mg via INTRAVENOUS

## 2022-01-17 MED ORDER — HALOPERIDOL LACTATE 5 MG/ML IJ SOLN
2.5000 mg | Freq: Once | INTRAMUSCULAR | Status: AC
  Administered 2022-01-17: 2.5 mg via INTRAVENOUS

## 2022-01-17 NOTE — Progress Notes (Signed)
Inpatient Rehab Admissions Coordinator:   Met with patient at bedside to discuss CIR.  He does track me and is able to demo short sustained attention when addressed, however given language and cognitive deficits he is unable to participate in conversation regarding rehab goals/expectations, etc.  I left a message for his sister to discuss caregiver support.  Per RN, who discussed with pt's sister earlier via phone, sister reports feeling overwhelmed with pt status even prior to this admission 2/2 progressing cognitive decline.  Will discuss expected goals and see if she prefers SNF versus CIR.  Would not be able to do both.    Shann Medal, PT, DPT Admissions Coordinator [phone] 01/17/22  1:38 PM

## 2022-01-17 NOTE — Progress Notes (Addendum)
5.4 mmHg AV Mean Grad:      3.0 mmHg LVOT Vmax:         85.40 cm/s LVOT Vmean:        57.100 cm/s LVOT VTI:          0.154 m LVOT/AV VTI ratio: 0.69  AORTA Ao Root diam: 4.50 cm Ao Asc diam:  4.40 cm MITRAL VALVE MV Area VTI:  3.07 cm   SHUNTS MV Peak grad: 7.0 mmHg   Systemic VTI:  0.15 m MV Mean grad: 3.0 mmHg   Systemic Diam: 2.30 cm MV Vmax:      1.32 m/s MV Vmean:     72.5 cm/s Kardie Tobb DO Electronically signed by Thomasene Ripple DO Signature Date/Time: 01/16/2022/5:09:24 PM    Final    VAS [place] LOWER EXTREMITY VENOUS (DVT)  Result Date: 01/17/2022  Lower Venous DVT Study Patient Name:  Casey Rangel  Date of Exam:   01/17/2022 Medical Rec #: [id number]       Accession #:    [id number] Date of Birth: April 07, 1951       Patient Gender: M Patient Age:   71 years Exam Location:  [facility] Procedure:      VAS [place] LOWER EXTREMITY VENOUS (DVT) Referring Phys: Scheryl Marten Yetzali Weld --------------------------------------------------------------------------------  Indications: Embolic stroke, history of hip fracture.  Comparison Study: No prior studies. Performing Technologist: Jean Rosenthal RDMS, RVT  Examination Guidelines: A complete evaluation includes B-mode imaging, spectral Doppler, color Doppler, and power Doppler as needed of all accessible portions of each vessel. Bilateral testing is considered an integral part of a complete examination. Limited examinations for reoccurring indications may be performed as noted. The reflux portion of the  exam is performed with the patient in reverse Trendelenburg.  +---------+---------------+---------+-----------+----------+--------------+ RIGHT    CompressibilityPhasicitySpontaneityPropertiesThrombus Aging +---------+---------------+---------+-----------+----------+--------------+ CFV      Full           Yes      Yes                                 +---------+---------------+---------+-----------+----------+--------------+ SFJ      Full                                                        +---------+---------------+---------+-----------+----------+--------------+ FV Prox  Full                                                        +---------+---------------+---------+-----------+----------+--------------+ FV Mid   Full                                                        +---------+---------------+---------+-----------+----------+--------------+ FV DistalFull                                                        +---------+---------------+---------+-----------+----------+--------------+  5.4 mmHg AV Mean Grad:      3.0 mmHg LVOT Vmax:         85.40 cm/s LVOT Vmean:        57.100 cm/s LVOT VTI:          0.154 m LVOT/AV VTI ratio: 0.69  AORTA Ao Root diam: 4.50 cm Ao Asc diam:  4.40 cm MITRAL VALVE MV Area VTI:  3.07 cm   SHUNTS MV Peak grad: 7.0 mmHg   Systemic VTI:  0.15 m MV Mean grad: 3.0 mmHg   Systemic Diam: 2.30 cm MV Vmax:      1.32 m/s MV Vmean:     72.5 cm/s Kardie Tobb DO Electronically signed by Thomasene Ripple DO Signature Date/Time: 01/16/2022/5:09:24 PM    Final    VAS [place] LOWER EXTREMITY VENOUS (DVT)  Result Date: 01/17/2022  Lower Venous DVT Study Patient Name:  Casey Rangel  Date of Exam:   01/17/2022 Medical Rec #: [id number]       Accession #:    [id number] Date of Birth: April 07, 1951       Patient Gender: M Patient Age:   71 years Exam Location:  [facility] Procedure:      VAS [place] LOWER EXTREMITY VENOUS (DVT) Referring Phys: Scheryl Marten Yetzali Weld --------------------------------------------------------------------------------  Indications: Embolic stroke, history of hip fracture.  Comparison Study: No prior studies. Performing Technologist: Jean Rosenthal RDMS, RVT  Examination Guidelines: A complete evaluation includes B-mode imaging, spectral Doppler, color Doppler, and power Doppler as needed of all accessible portions of each vessel. Bilateral testing is considered an integral part of a complete examination. Limited examinations for reoccurring indications may be performed as noted. The reflux portion of the  exam is performed with the patient in reverse Trendelenburg.  +---------+---------------+---------+-----------+----------+--------------+ RIGHT    CompressibilityPhasicitySpontaneityPropertiesThrombus Aging +---------+---------------+---------+-----------+----------+--------------+ CFV      Full           Yes      Yes                                 +---------+---------------+---------+-----------+----------+--------------+ SFJ      Full                                                        +---------+---------------+---------+-----------+----------+--------------+ FV Prox  Full                                                        +---------+---------------+---------+-----------+----------+--------------+ FV Mid   Full                                                        +---------+---------------+---------+-----------+----------+--------------+ FV DistalFull                                                        +---------+---------------+---------+-----------+----------+--------------+  5.4 mmHg AV Mean Grad:      3.0 mmHg LVOT Vmax:         85.40 cm/s LVOT Vmean:        57.100 cm/s LVOT VTI:          0.154 m LVOT/AV VTI ratio: 0.69  AORTA Ao Root diam: 4.50 cm Ao Asc diam:  4.40 cm MITRAL VALVE MV Area VTI:  3.07 cm   SHUNTS MV Peak grad: 7.0 mmHg   Systemic VTI:  0.15 m MV Mean grad: 3.0 mmHg   Systemic Diam: 2.30 cm MV Vmax:      1.32 m/s MV Vmean:     72.5 cm/s Kardie Tobb DO Electronically signed by Thomasene Ripple DO Signature Date/Time: 01/16/2022/5:09:24 PM    Final    VAS [place] LOWER EXTREMITY VENOUS (DVT)  Result Date: 01/17/2022  Lower Venous DVT Study Patient Name:  Casey Rangel  Date of Exam:   01/17/2022 Medical Rec #: [id number]       Accession #:    [id number] Date of Birth: April 07, 1951       Patient Gender: M Patient Age:   71 years Exam Location:  [facility] Procedure:      VAS [place] LOWER EXTREMITY VENOUS (DVT) Referring Phys: Scheryl Marten Yetzali Weld --------------------------------------------------------------------------------  Indications: Embolic stroke, history of hip fracture.  Comparison Study: No prior studies. Performing Technologist: Jean Rosenthal RDMS, RVT  Examination Guidelines: A complete evaluation includes B-mode imaging, spectral Doppler, color Doppler, and power Doppler as needed of all accessible portions of each vessel. Bilateral testing is considered an integral part of a complete examination. Limited examinations for reoccurring indications may be performed as noted. The reflux portion of the  exam is performed with the patient in reverse Trendelenburg.  +---------+---------------+---------+-----------+----------+--------------+ RIGHT    CompressibilityPhasicitySpontaneityPropertiesThrombus Aging +---------+---------------+---------+-----------+----------+--------------+ CFV      Full           Yes      Yes                                 +---------+---------------+---------+-----------+----------+--------------+ SFJ      Full                                                        +---------+---------------+---------+-----------+----------+--------------+ FV Prox  Full                                                        +---------+---------------+---------+-----------+----------+--------------+ FV Mid   Full                                                        +---------+---------------+---------+-----------+----------+--------------+ FV DistalFull                                                        +---------+---------------+---------+-----------+----------+--------------+  5.4 mmHg AV Mean Grad:      3.0 mmHg LVOT Vmax:         85.40 cm/s LVOT Vmean:        57.100 cm/s LVOT VTI:          0.154 m LVOT/AV VTI ratio: 0.69  AORTA Ao Root diam: 4.50 cm Ao Asc diam:  4.40 cm MITRAL VALVE MV Area VTI:  3.07 cm   SHUNTS MV Peak grad: 7.0 mmHg   Systemic VTI:  0.15 m MV Mean grad: 3.0 mmHg   Systemic Diam: 2.30 cm MV Vmax:      1.32 m/s MV Vmean:     72.5 cm/s Kardie Tobb DO Electronically signed by Thomasene Ripple DO Signature Date/Time: 01/16/2022/5:09:24 PM    Final    VAS [place] LOWER EXTREMITY VENOUS (DVT)  Result Date: 01/17/2022  Lower Venous DVT Study Patient Name:  Casey Rangel  Date of Exam:   01/17/2022 Medical Rec #: [id number]       Accession #:    [id number] Date of Birth: April 07, 1951       Patient Gender: M Patient Age:   71 years Exam Location:  [facility] Procedure:      VAS [place] LOWER EXTREMITY VENOUS (DVT) Referring Phys: Scheryl Marten Yetzali Weld --------------------------------------------------------------------------------  Indications: Embolic stroke, history of hip fracture.  Comparison Study: No prior studies. Performing Technologist: Jean Rosenthal RDMS, RVT  Examination Guidelines: A complete evaluation includes B-mode imaging, spectral Doppler, color Doppler, and power Doppler as needed of all accessible portions of each vessel. Bilateral testing is considered an integral part of a complete examination. Limited examinations for reoccurring indications may be performed as noted. The reflux portion of the  exam is performed with the patient in reverse Trendelenburg.  +---------+---------------+---------+-----------+----------+--------------+ RIGHT    CompressibilityPhasicitySpontaneityPropertiesThrombus Aging +---------+---------------+---------+-----------+----------+--------------+ CFV      Full           Yes      Yes                                 +---------+---------------+---------+-----------+----------+--------------+ SFJ      Full                                                        +---------+---------------+---------+-----------+----------+--------------+ FV Prox  Full                                                        +---------+---------------+---------+-----------+----------+--------------+ FV Mid   Full                                                        +---------+---------------+---------+-----------+----------+--------------+ FV DistalFull                                                        +---------+---------------+---------+-----------+----------+--------------+  5.4 mmHg AV Mean Grad:      3.0 mmHg LVOT Vmax:         85.40 cm/s LVOT Vmean:        57.100 cm/s LVOT VTI:          0.154 m LVOT/AV VTI ratio: 0.69  AORTA Ao Root diam: 4.50 cm Ao Asc diam:  4.40 cm MITRAL VALVE MV Area VTI:  3.07 cm   SHUNTS MV Peak grad: 7.0 mmHg   Systemic VTI:  0.15 m MV Mean grad: 3.0 mmHg   Systemic Diam: 2.30 cm MV Vmax:      1.32 m/s MV Vmean:     72.5 cm/s Kardie Tobb DO Electronically signed by Thomasene Ripple DO Signature Date/Time: 01/16/2022/5:09:24 PM    Final    VAS [place] LOWER EXTREMITY VENOUS (DVT)  Result Date: 01/17/2022  Lower Venous DVT Study Patient Name:  Casey Rangel  Date of Exam:   01/17/2022 Medical Rec #: [id number]       Accession #:    [id number] Date of Birth: April 07, 1951       Patient Gender: M Patient Age:   71 years Exam Location:  [facility] Procedure:      VAS [place] LOWER EXTREMITY VENOUS (DVT) Referring Phys: Scheryl Marten Yetzali Weld --------------------------------------------------------------------------------  Indications: Embolic stroke, history of hip fracture.  Comparison Study: No prior studies. Performing Technologist: Jean Rosenthal RDMS, RVT  Examination Guidelines: A complete evaluation includes B-mode imaging, spectral Doppler, color Doppler, and power Doppler as needed of all accessible portions of each vessel. Bilateral testing is considered an integral part of a complete examination. Limited examinations for reoccurring indications may be performed as noted. The reflux portion of the  exam is performed with the patient in reverse Trendelenburg.  +---------+---------------+---------+-----------+----------+--------------+ RIGHT    CompressibilityPhasicitySpontaneityPropertiesThrombus Aging +---------+---------------+---------+-----------+----------+--------------+ CFV      Full           Yes      Yes                                 +---------+---------------+---------+-----------+----------+--------------+ SFJ      Full                                                        +---------+---------------+---------+-----------+----------+--------------+ FV Prox  Full                                                        +---------+---------------+---------+-----------+----------+--------------+ FV Mid   Full                                                        +---------+---------------+---------+-----------+----------+--------------+ FV DistalFull                                                        +---------+---------------+---------+-----------+----------+--------------+  5.4 mmHg AV Mean Grad:      3.0 mmHg LVOT Vmax:         85.40 cm/s LVOT Vmean:        57.100 cm/s LVOT VTI:          0.154 m LVOT/AV VTI ratio: 0.69  AORTA Ao Root diam: 4.50 cm Ao Asc diam:  4.40 cm MITRAL VALVE MV Area VTI:  3.07 cm   SHUNTS MV Peak grad: 7.0 mmHg   Systemic VTI:  0.15 m MV Mean grad: 3.0 mmHg   Systemic Diam: 2.30 cm MV Vmax:      1.32 m/s MV Vmean:     72.5 cm/s Kardie Tobb DO Electronically signed by Thomasene Ripple DO Signature Date/Time: 01/16/2022/5:09:24 PM    Final    VAS [place] LOWER EXTREMITY VENOUS (DVT)  Result Date: 01/17/2022  Lower Venous DVT Study Patient Name:  Casey Rangel  Date of Exam:   01/17/2022 Medical Rec #: [id number]       Accession #:    [id number] Date of Birth: April 07, 1951       Patient Gender: M Patient Age:   71 years Exam Location:  [facility] Procedure:      VAS [place] LOWER EXTREMITY VENOUS (DVT) Referring Phys: Scheryl Marten Yetzali Weld --------------------------------------------------------------------------------  Indications: Embolic stroke, history of hip fracture.  Comparison Study: No prior studies. Performing Technologist: Jean Rosenthal RDMS, RVT  Examination Guidelines: A complete evaluation includes B-mode imaging, spectral Doppler, color Doppler, and power Doppler as needed of all accessible portions of each vessel. Bilateral testing is considered an integral part of a complete examination. Limited examinations for reoccurring indications may be performed as noted. The reflux portion of the  exam is performed with the patient in reverse Trendelenburg.  +---------+---------------+---------+-----------+----------+--------------+ RIGHT    CompressibilityPhasicitySpontaneityPropertiesThrombus Aging +---------+---------------+---------+-----------+----------+--------------+ CFV      Full           Yes      Yes                                 +---------+---------------+---------+-----------+----------+--------------+ SFJ      Full                                                        +---------+---------------+---------+-----------+----------+--------------+ FV Prox  Full                                                        +---------+---------------+---------+-----------+----------+--------------+ FV Mid   Full                                                        +---------+---------------+---------+-----------+----------+--------------+ FV DistalFull                                                        +---------+---------------+---------+-----------+----------+--------------+  high-grade focal stenosis within the supraclinoid left ICA is no longer appreciated. The M1 middle cerebral arteries are patent. 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 Motion degradation precludes adequate evaluation of the M2 and more distal MCA vessels. The anterior cerebral arteries are patent without appreciable high-grade proximal stenosis. Posterior circulation: The non dominant right vertebral artery terminates predominantly as the right PICA. The dominant left vertebral artery is patent intracranially without significant stenosis. The basilar artery is patent. Mild atherosclerotic irregularity of the basilar artery. The bilateral PCAs are predominantly fetal in origin. Both PCAs appear patent. However, significant motion degradation precludes adequate evaluation for, an accurate quantification of, stenoses within these vessels. Anatomic variants: As described. IMPRESSION: MRI brain: 1. Prematurely terminated and motion degraded examination, as described. 2. Redemonstrated patchy acute cortical/subcortical infarcts within the left MCA territory, left MCA/ACA watershed territory and left MCA/PCA watershed territory. These infarcts have not significant changed in extent from the prior brain MRI of 01/15/2022. 3. 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 4. Mild generalized cerebral atrophy. 5. Paranasal sinus disease, as described. MRA head: 1. Significantly motion degraded examination, limiting evaluation as described. 2. 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 3. 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 Electronically Signed   By: Jackey Loge D.O.   On: 01/16/2022 15:19   ECHOCARDIOGRAM COMPLETE  Result Date: 01/16/2022    ECHOCARDIOGRAM REPORT   Patient Name:   Casey Rangel Date of Exam: 01/16/2022 Medical Rec #:  [id number]      Height:        70.0 in Accession #:    [id number]     Weight:       181.7 lb Date of Birth:  [birth year]      BSA:          2.004 m Patient Age:    70 years       BP:           132/84 mmHg Patient Gender: M              HR:           74 bpm. Exam Location:  Inpatient Procedure: 2D Echo, Cardiac Doppler and Color Doppler Indications:    Stroke  History:        Patient has no prior history of Echocardiogram examinations.                 Risk Factors:Former Smoker. GERD.  Sonographer:    Ross Ludwig RDCS (AE) Referring Phys: [id number] Gordy Councilman  Sonographer Comments: No subcostal window. IMPRESSIONS  1. Left ventricular ejection fraction, by estimation, is 35 to 40%. The left ventricle has moderately decreased function. The left ventricle demonstrates global hypokinesis. There is severe concentric left ventricular hypertrophy. Left ventricular diastolic function could not be evaluated.  2. Right ventricular systolic function is normal. The right ventricular size is normal.  3. A small pericardial effusion is present. The pericardial effusion is anterior to the right ventricle. There is no evidence of cardiac tamponade.  4. The mitral valve is normal in structure. No evidence of mitral valve regurgitation. No evidence of mitral stenosis.  5. The aortic valve is normal in structure. Aortic valve regurgitation is not visualized. No aortic stenosis is present.  6. 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  7. 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 The M1 middle cerebral arteries are patent. 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 Motion degradation precludes adequate evaluation of the M2 and more distal MCA vessels. The anterior cerebral arteries are patent without appreciable high-grade proximal stenosis. Posterior circulation: The non dominant right vertebral artery terminates predominantly as the right PICA. The dominant left vertebral artery is patent intracranially without significant stenosis. The basilar artery is patent. Mild atherosclerotic irregularity of the basilar artery. The bilateral PCAs are predominantly fetal in origin. Both PCAs appear patent. However, significant motion degradation precludes adequate evaluation for, an accurate quantification of, stenoses within these vessels. Anatomic variants: As described. IMPRESSION: MRI brain: 1. Prematurely terminated and motion degraded examination, as described. 2. Redemonstrated patchy acute cortical/subcortical infarcts within the left MCA territory, left MCA/ACA watershed territory and left MCA/PCA watershed territory. These infarcts have not significant changed in extent from the prior brain MRI of 01/15/2022. 3. 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 4. Mild generalized cerebral atrophy. 5. Paranasal sinus disease, as described. MRA head: 1. Significantly motion degraded examination, limiting evaluation as described. 2. 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 3. 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 Electronically Signed   By: Jackey Loge D.O.   On: 01/16/2022 15:19   ECHOCARDIOGRAM COMPLETE  Result Date: 01/16/2022    ECHOCARDIOGRAM REPORT   Patient Name:   Casey Rangel Date of Exam: 01/16/2022 Medical Rec #:  [id number]      Height:        70.0 in Accession #:    [id number]     Weight:       181.7 lb Date of Birth:  [birth year]      BSA:          2.004 m Patient Age:    70 years       BP:           132/84 mmHg Patient Gender: M              HR:           74 bpm. Exam Location:  Inpatient Procedure: 2D Echo, Cardiac Doppler and Color Doppler Indications:    Stroke  History:        Patient has no prior history of Echocardiogram examinations.                 Risk Factors:Former Smoker. GERD.  Sonographer:    Ross Ludwig RDCS (AE) Referring Phys: [id number] Gordy Councilman  Sonographer Comments: No subcostal window. IMPRESSIONS  1. Left ventricular ejection fraction, by estimation, is 35 to 40%. The left ventricle has moderately decreased function. The left ventricle demonstrates global hypokinesis. There is severe concentric left ventricular hypertrophy. Left ventricular diastolic function could not be evaluated.  2. Right ventricular systolic function is normal. The right ventricular size is normal.  3. A small pericardial effusion is present. The pericardial effusion is anterior to the right ventricle. There is no evidence of cardiac tamponade.  4. The mitral valve is normal in structure. No evidence of mitral valve regurgitation. No evidence of mitral stenosis.  5. The aortic valve is normal in structure. Aortic valve regurgitation is not visualized. No aortic stenosis is present.  6. 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  7. The inferior vena cava  5.4 mmHg AV Mean Grad:      3.0 mmHg LVOT Vmax:         85.40 cm/s LVOT Vmean:        57.100 cm/s LVOT VTI:          0.154 m LVOT/AV VTI ratio: 0.69  AORTA Ao Root diam: 4.50 cm Ao Asc diam:  4.40 cm MITRAL VALVE MV Area VTI:  3.07 cm   SHUNTS MV Peak grad: 7.0 mmHg   Systemic VTI:  0.15 m MV Mean grad: 3.0 mmHg   Systemic Diam: 2.30 cm MV Vmax:      1.32 m/s MV Vmean:     72.5 cm/s Kardie Tobb DO Electronically signed by Thomasene Ripple DO Signature Date/Time: 01/16/2022/5:09:24 PM    Final    VAS [place] LOWER EXTREMITY VENOUS (DVT)  Result Date: 01/17/2022  Lower Venous DVT Study Patient Name:  Casey Rangel  Date of Exam:   01/17/2022 Medical Rec #: [id number]       Accession #:    [id number] Date of Birth: April 07, 1951       Patient Gender: M Patient Age:   71 years Exam Location:  [facility] Procedure:      VAS [place] LOWER EXTREMITY VENOUS (DVT) Referring Phys: Scheryl Marten Yetzali Weld --------------------------------------------------------------------------------  Indications: Embolic stroke, history of hip fracture.  Comparison Study: No prior studies. Performing Technologist: Jean Rosenthal RDMS, RVT  Examination Guidelines: A complete evaluation includes B-mode imaging, spectral Doppler, color Doppler, and power Doppler as needed of all accessible portions of each vessel. Bilateral testing is considered an integral part of a complete examination. Limited examinations for reoccurring indications may be performed as noted. The reflux portion of the  exam is performed with the patient in reverse Trendelenburg.  +---------+---------------+---------+-----------+----------+--------------+ RIGHT    CompressibilityPhasicitySpontaneityPropertiesThrombus Aging +---------+---------------+---------+-----------+----------+--------------+ CFV      Full           Yes      Yes                                 +---------+---------------+---------+-----------+----------+--------------+ SFJ      Full                                                        +---------+---------------+---------+-----------+----------+--------------+ FV Prox  Full                                                        +---------+---------------+---------+-----------+----------+--------------+ FV Mid   Full                                                        +---------+---------------+---------+-----------+----------+--------------+ FV DistalFull                                                        +---------+---------------+---------+-----------+----------+--------------+

## 2022-01-17 NOTE — TOC CAGE-AID Note (Signed)
Transition of Care [facility]) - CAGE-AID Screening   Patient Details  Name: Casey Rangel MRN: [id number] Date of Birth: September 01, 1950  Transition of Care [facility]) CM/SW Contact:    Marlow Hendrie C Tarpley-Carter, LCSWA Phone Number: 01/17/2022, 12:39 PM   Clinical Narrative: Pt is unable to participate in Cage Aid.  Blakeleigh Domek Tarpley-Carter, MSW, LCSW-A Pronouns:  She/Her/Hers [facility] HealthTransitions of Care Clinical Social Worker Direct Number:  [phone] [email or web address]  CAGE-AID Screening: Substance Abuse Screening unable to be completed due to: : Patient unable to participate

## 2022-01-17 NOTE — Progress Notes (Signed)
Physical Therapy Treatment Patient Details Name: Casey Rangel MRN: [id number] DOB: August 31, 1950 Today's Date: 01/17/2022   History of Present Illness 71 yo admitted with acute R sided weakness and aphasia, found down non-responsive by his sister. 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 MRI pending. PMH L hip fx ; T9-11 fusion    PT Comments    Patient progressing to ambulation in hallway this session.  Still leaning R and with decreased R side awareness and R UE increased tone and no active movement noted, though was gripping walker once hand placed there.  Patient still somewhat apraxic unable to sequence using comb and needing cues for using tooth brush correctly (wanted to chew it).  He remains appropriate for acute inpatient rehab at d/c.  PT to continue to follow acutely.    Recommendations for follow up therapy are one component of a multi-disciplinary discharge planning process, led by the attending physician.  Recommendations may be updated based on patient status, additional functional criteria and insurance authorization.  Follow Up Recommendations  Acute inpatient rehab (3hours/day)     Assistance Recommended at Discharge Frequent or constant Supervision/Assistance  Patient can return home with the following A lot of help with walking and/or transfers;A lot of help with bathing/dressing/bathroom;Assistance with cooking/housework;Assist for transportation;Help with stairs or ramp for entrance;Direct supervision/assist for medications management;Direct supervision/assist for financial management   Equipment Recommendations  Other (comment)    Recommendations for Other Services       Precautions / Restrictions Precautions Precautions: Fall Precaution Comments: BP parameters     Mobility  Bed Mobility Overal bed mobility: Needs Assistance Bed Mobility: Supine to Sit Rolling: Mod assist         General bed mobility comments: some lifting help to sit  upright in bed, pt initiated on his own, but poor motor planning    Transfers Overall transfer level: Needs assistance Equipment used: Rolling walker (2 wheels) Transfers: Sit to/from Stand Sit to Stand: Min assist, Mod assist           General transfer comment: assist for balance/safety, placing R hand on walker    Ambulation/Gait Ambulation/Gait assistance: Mod assist Gait Distance (Feet): 120 Feet Assistive device: Rolling walker (2 wheels) Gait Pattern/deviations: Step-through pattern, Step-to pattern, Decreased step length - right       General Gait Details: Assist to keep R hand on walker and for preventing R lateral LOB, assist to guide walker and for directions   Stairs             Wheelchair Mobility    Modified Rankin (Stroke Patients Only) Modified Rankin (Stroke Patients Only) Pre-Morbid Rankin Score: Slight disability Modified Rankin: Moderately severe disability     Balance Overall balance assessment: Needs assistance Sitting-balance support: Feet supported Sitting balance-Leahy Scale: Fair     Standing balance support: Bilateral upper extremity supported, During functional activity Standing balance-Leahy Scale: Poor Standing balance comment: UE support and assist for balance                            Cognition Arousal/Alertness: Awake/alert Behavior During Therapy: Impulsive Overall Cognitive Status: Difficult to assess                                 General Comments: follows gesturing cues but needs close S for safety due to impulsivity; HOH and aphasic  Exercises      General Comments General comments (skin integrity, edema, etc.): VSS (ambulated without monitor for safety to avoid holding it RN aware)  BP 140's/80's after ambulation; while in chair handed him a comb and pt did not seem to know what to do with it; able to brush teeth after toothpaste applied and some gesturing cues       Pertinent Vitals/Pain Pain Assessment Faces Pain Scale: No hurt    Home Living                          Prior Function            PT Goals (current goals can now be found in the care plan section) Progress towards PT goals: Progressing toward goals    Frequency    Min 4X/week      PT Plan Current plan remains appropriate    Co-evaluation              AM-PAC PT "6 Clicks" Mobility   Outcome Measure  Help needed turning from your back to your side while in a flat bed without using bedrails?: A Lot Help needed moving from lying on your back to sitting on the side of a flat bed without using bedrails?: A Lot Help needed moving to and from a bed to a chair (including a wheelchair)?: A Lot Help needed standing up from a chair using your arms (e.g., wheelchair or bedside chair)?: A Lot Help needed to walk in hospital room?: A Lot Help needed climbing 3-5 steps with a railing? : Total 6 Click Score: 11    End of Session Equipment Utilized During Treatment: Gait belt Activity Tolerance: Patient tolerated treatment well Patient left: in chair;with call bell/phone within reach;with restraints reapplied;with chair alarm set Nurse Communication: Mobility status PT Visit Diagnosis: Unsteadiness on feet (R26.81);Muscle weakness (generalized) (M62.81);Hemiplegia and hemiparesis Hemiplegia - Right/Left: Right Hemiplegia - dominant/non-dominant: Dominant Hemiplegia - caused by: Cerebral infarction     Time: 1000-1025 PT Time Calculation (min) (ACUTE ONLY): 25 min  Charges:  $Gait Training: 8-22 mins $Therapeutic Activity: 8-22 mins                     Sheran Lawless, PT Acute Rehabilitation Services Pager:[phone] Office:[phone] 01/17/2022    Casey Rangel 01/17/2022, 5:13 PM

## 2022-01-17 NOTE — Progress Notes (Signed)
Lower extremity venous bilateral study completed.   Please see CV Proc for preliminary results.   Castin Donaghue, RDMS, RVT  

## 2022-01-17 NOTE — Progress Notes (Signed)
Speech Language Pathology Treatment: Dysphagia;Cognitive-Linquistic  Patient Details Name: Casey Rangel MRN: [id number] DOB: Feb 28, 1951 Today's Date: 01/17/2022 Time: 8185-6314 SLP Time Calculation (min) (ACUTE ONLY): 18 min  Assessment / Plan / Recommendation Clinical Impression  Pt was seen for treatment. He was alert and cooperative during the session without c/o pain. Pt's verbal output and comprehension were improved compared to when he was last seen on 6/1. Pt produced single-word utterances, including, but not limited to, "yes", "no", "yeah", "hey", and "oh". Pt responded to simple yes/no questions with 40% accuracy increasing to 60% with repetition/rephrasing. Accuracy continues to be better during more functional contexts. He followed more simple 1-step commands during this session within tasks, but required verbal prompts and tactile cues for more abstract 1-step commands. Pt's RN reported that the pt has been tolerating meals and whole pills without difficulty including signs of aspiration. Pt tolerated trials of dysphagia 3 solids with functional mastication; an oral/pharyngeal delay was not suspected considering length of mastication vs time of hyolaryngeal elevation. No s/sx of aspiration were noted with dysphagia 3 solids or with consecutive swallows of thin liquids via straw. Pt's diet will be advanced to dysphagia 3 solids and thin liquids. SLP will continue to follow pt.     HPI HPI: Pt is a 71 y.o. male who presented with aphasia and right-sided weakness. MRI brain 5/31: Patchy acute ischemic nonhemorrhagic left MCA distribution  infarct, watershed in distribution. 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 Pt s/p angiography, thrombectomy and angioplasty 5/31. PMH: avoidance of healthcare, left hip fracture, intermittent memory impairment.      SLP Plan  Continue with current plan of care      Recommendations for follow up therapy are one component of a  multi-disciplinary discharge planning process, led by the attending physician.  Recommendations may be updated based on patient status, additional functional criteria and insurance authorization.    Recommendations  Diet recommendations: Dysphagia 3 (mechanical soft);Thin liquid Liquids provided via: Cup;Straw Medication Administration: Whole meds with liquid Supervision: Staff to assist with self feeding Compensations: Slow rate;Small sips/bites;Follow solids with liquid Postural Changes and/or Swallow Maneuvers: Seated upright 90 degrees                Oral Care Recommendations: Oral care BID Follow Up Recommendations: Acute inpatient rehab (3hours/day) Assistance recommended at discharge: Frequent or constant Supervision/Assistance SLP Visit Diagnosis: Dysphagia, oropharyngeal phase (R13.12) Plan: Continue with current plan of care         Casey Gaber I. Casey Clock, MS, CCC-SLP Acute Rehabilitation Services Office number [phone] Pager [phone]   Casey Rangel  01/17/2022, 9:41 AM

## 2022-01-17 NOTE — Progress Notes (Signed)
Transition of Care [facility]) Screening Note   Patient Details  Name: Casey Rangel Date of Birth: [birth year]   Transition of Care [facility]) CM/SW Contact:    Mearl Latin, LCSW Phone Number: 01/17/2022, 10:01 AM    Transition of Care Department [facility]) has reviewed patient and he has potential rehab needs with CIR following. We will continue to monitor patient advancement through interdisciplinary progression rounds. If new patient transition needs arise, please place a TOC consult.

## 2022-01-18 LAB — BASIC METABOLIC PANEL
Anion gap: 9 (ref 5–15)
BUN: 14 mg/dL (ref 8–23)
CO2: 19 mmol/L — ABNORMAL LOW (ref 22–32)
Calcium: 8.7 mg/dL — ABNORMAL LOW (ref 8.9–10.3)
Chloride: 109 mmol/L (ref 98–111)
Creatinine, Ser: 0.87 mg/dL (ref 0.61–1.24)
GFR, Estimated: >60 mL/min (ref >60)
Glucose, Bld: 114 mg/dL — ABNORMAL HIGH (ref 70–99)
Potassium: 3.2 mmol/L — ABNORMAL LOW (ref 3.5–5.1)
Sodium: 137 mmol/L (ref 135–145)

## 2022-01-18 LAB — GLUCOSE, CAPILLARY
Glucose-Capillary: 121 mg/dL — ABNORMAL HIGH (ref 70–99)
Glucose-Capillary: 176 mg/dL — ABNORMAL HIGH (ref 70–99)
Glucose-Capillary: 116 mg/dL — ABNORMAL HIGH (ref 70–99)
Glucose-Capillary: 126 mg/dL — ABNORMAL HIGH (ref 70–99)

## 2022-01-18 LAB — CBC
HCT: 45.4 % (ref 39.0–52.0)
Hemoglobin: 16.1 g/dL (ref 13.0–17.0)
MCH: 31.3 pg (ref 26.0–34.0)
MCHC: 35.5 g/dL (ref 30.0–36.0)
MCV: 88.2 fL (ref 80.0–100.0)
Platelets: 297 10*3/uL (ref 150–400)
RBC: 5.15 MIL/uL (ref 4.22–5.81)
RDW: 12.9 % (ref 11.5–15.5)
WBC: 10.7 10*3/uL — ABNORMAL HIGH (ref 4.0–10.5)
nRBC: 0 % (ref 0.0–0.2)

## 2022-01-18 MED ORDER — POTASSIUM CHLORIDE 20 MEQ PO PACK
40.0000 meq | PACK | Freq: Two times a day (BID) | ORAL | Status: DC
  Administered 2022-01-18 – 2022-01-21 (×7): 40 meq via ORAL
  Filled 2022-01-18 (×7): qty 2

## 2022-01-18 NOTE — Progress Notes (Addendum)
STROKE TEAM PROGRESS NOTE   INTERVAL HISTORY No family at the bedside. More interactive today BP goal less than 180. CM to discuss SNF vs CIR with family. He is seen sitting up in the chair, able to feed himself.  Vital signs stable.  Neurological exam unchanged.  White count has come down.  Potassium is low at 3.2 Vitals:   01/18/22 0400 01/18/22 0839 01/18/22 1120 01/18/22 1210  BP: (!) 150/74 (!) 159/81 125/79 139/84  Pulse: 90 68 (!) 111 99  Resp: 20  (!) 27 20  Temp: 97.9 F (36.6 C) 97.8 F (36.6 C) 98.4 F (36.9 C) 98.6 F (37 C)  TempSrc: Axillary Axillary Oral   SpO2:   92% 99%  Weight:      Height:       CBC:  Recent Labs  Lab 01/15/22 1908 01/15/22 1914 01/17/22 0052 01/18/22 0100  WBC 16.3*   < > 12.8* 10.7*  NEUTROABS 13.9*  --   --   --   HGB 16.4   < > 14.0 16.1  HCT 49.3   < > 41.3 45.4  MCV 94.6   < > 91.8 88.2  PLT 277   < > 277 297   < > = values in this interval not displayed.    Basic Metabolic Panel:  Recent Labs  Lab 01/17/22 0052 01/18/22 0100  NA 139 137  K 3.7 3.2*  CL 110 109  CO2 22 19*  GLUCOSE 90 114*  BUN 17 14  CREATININE 0.93 0.87  CALCIUM 8.4* 8.7*    Lipid Panel:  Recent Labs  Lab 01/16/22 0449  CHOL 178  TRIG 43  HDL 40*  CHOLHDL 4.5  VLDL 9  LDLCALC 129*    HgbA1c:  Recent Labs  Lab 01/16/22 0449  HGBA1C 5.1    Urine Drug Screen:  Recent Labs  Lab 01/15/22 1908  LABOPIA NONE DETECTED  COCAINSCRNUR NONE DETECTED  LABBENZ NONE DETECTED  AMPHETMU NONE DETECTED  THCU NONE DETECTED  LABBARB NONE DETECTED     Alcohol Level  Recent Labs  Lab 01/15/22 1930  ETH <10     IMAGING past 24 hours No results found.  PHYSICAL EXAM  Physical Exam  Constitutional: Appears well-developed and well-nourished elderly male not in distress.  Cardiovascular: Normal rate and regular rhythm.  Respiratory: Effort normal, labored breathing on 4L [place]  Neuro: Mental Status: Patient is awake, alert globally  aphasic Speaks occasional words but unable to speak sentences.  Follows simple midline and one-step commands.  Unable to name repeat  cranial Nerves: II: Visual Fields are full. Pupils are equal, round, and reactive to light.   III,IV, VI: EOMI without ptosis or diploplia.  V: Facial sensation is symmetric to temperature VII: Facial movement is symmetric resting and smiling VIII: Hearing is intact to voice X: Palate elevates symmetrically XI: Shoulder shrug strong on the left XII:  Motor: Tone is normal. Bulk is normal.  RUE 0/5 LUE 5/5 RLE 3/5 LLE 5/5 Sensory: Sensation is symmetric to light touch and temperature in the arms and legs. No extinction to DSS present.  Withdrawal to pain in all extremities Cerebellar: Unable to participate, no ataxia noted in left upper or lower extremity   ASSESSMENT/PLAN [name] is a 71 y.o. male with history of GERD, left hip fracture presenting with global aphasia and right sided weakness and 3 days of increased confusion. Taken for a mechanical thrombectomy and angioplasty. Start DAPT therapy with ASA 81mg  and  plavix 75mg  after MRI. Passed MBS. Will recommend cardiology consult for cardiomyopathies, check LE venous Doppler, and recommend 30-day cardiac event monitor as outpatient. Cardiology consult placed, appreciate recommendations.  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 Cardioembolic source  Code Stroke CT head No acute abnormality. old infarct left parieto-occipital, right PCA, right MCA/ACA  CTA head & neck severe near occlusive stenosis at the Lt ICA terminus with radiographic string sign.  CT perfusion 190 mL perfusion deficit within the left MCA distribution  MRI  patchy watershed distrubution stroke in left MCA territory S/p IR with mechanical thrombectomy and angioplasty of left ICA terminus MRA  The previously demonstrated high-grade  stenosis of the supraclinoid left ICA is no longer appreciated. 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 2D Echo EF 35 to 40% LE venous Doppler no DVT We will consider 30-day CardioNet monitoring as outpatient to rule out A-fib LDL 129 HgbA1c 5.1 VTE prophylaxis -Lovenox No antithrombotic prior to admission, ASA 81mg  and Plavix 75mg  DAPT for 3 weeks and then ASA alone.  Therapy recommendations: CIR Disposition:  pending  Cardiomyopathy 2D echo EF 35 to 40% No prior records to compare Cardiology consulted 6/3  Hypertension BP goal < 180/105 Stable now Long-term BP goal normotensive Off Cleviprex Start norvasc 5mg   Hyperlipidemia Home meds:  None LDL 129, goal < 70 Add crestor 20mg  Continue statin at discharge  Sundowning  Agitation 6/2 evening Needed haldol  IV PRN Put on seroquel 25mg  Qhs EKG pending to rule out QT prolongation   Other Stroke Risk Factors Advanced Age >/= 3   Other Active Problems Dysphagia - now pass swallow, on dys3 and thin SLP continuing to follow to advance diet Acute hypoxic respiratory insufficiency- resolved   Hospital day # 3  Patient seen and examined by NP/APP with MD. MD to update note as needed.   8/3, DNP, FNP-BC [facility] Pager: [phone]  I have personally obtained history,examined this patient, reviewed notes, independently viewed imaging studies, participated in medical decision making and plan of care.ROS completed by me personally and pertinent positives fully documented  I have made any additions or clarifications directly to the above note. Agree with note above.  Continue ongoing physical occupational and speech therapy.  Mobilize out of bed.  Transfer to rehab versus SNF after discussion with family.  Replace potassium.  No family available at the bedside.  Greater than 50% time during the 35-minute visit was spent in counseling and coordination of care about stroke and  aphasia and discussion with care team and answering questions  , MD Medical Director [facility] St[facility] Center Pager: [phone] 01/18/2022 2:02 PM   To contact Stroke Continuity provider, please refer to Elmer Picker. After hours, contact General Neurology

## 2022-01-18 NOTE — Plan of Care (Signed)
  Problem: Education: Goal: Knowledge of secondary prevention will improve (SELECT ALL) Outcome: Not Progressing Goal: Knowledge of patient specific risk factors will improve (INDIVIDUALIZE FOR PATIENT) Outcome: Not Progressing   

## 2022-01-18 NOTE — Consult Note (Signed)
Cardiology Consultation:   Patient ID: Casey Rangel; [id number]; August 27, 1950   Admit date: 01/15/2022 Date of Consult: 01/18/2022  Primary Care Provider: Pcp, No Primary Cardiologist: None  Primary Electrophysiologist:  None   Patient Profile:   Casey Rangel is a 71 y.o. male without a cardiac history who is being seen today for the evaluation of cardiomyopathy at the request of Dr. Pearlean Brownie.  History of Present Illness:   Casey Rangel does not have a past cardiac history.  However, per his sister he does not seek medical care.  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  He had mechanical thrombectomy.   He presented with aphasia and right sided weakness.  He is found to have an EF of about 35% with somewhat global hypokinesis.  The patient is not able to give me much history.  He is hard of hearing and has some aphasia.  He is able to give some yes no answers and denies pain.  He was following commands per the nurse and is able to move all but his right upper extremity.  I was able to call and talk to his sister.  She said that recently had not been having any cardiovascular complaints.  He has been slowing down over 2 years.  However, she has not noticed any shortness of breath.  He sleeps with a couple of pillows.  He is not been displaying evidence of PND or orthopnea.  He has not been having any chest pressure, neck or arm discomfort per her report.  He never had any cardiovascular studies.  Past Medical History:  Diagnosis Date   GERD (gastroesophageal reflux disease)    Hiatal hernia     Past Surgical History:  Procedure Laterality Date   INTRAMEDULLARY (IM) NAIL INTERTROCHANTERIC Left 10/17/2020   Procedure: INTRAMEDULLARY (IM) NAIL INTERTROCHANTRIC;  Surgeon: Kennedy Bucker, MD;  Location: [facility] ORS;  Service: Orthopedics;  Laterality: Left;   IR CT HEAD LTD  01/15/2022   IR PERCUTANEOUS ART THROMBECTOMY/INFUSION INTRACRANIAL INC DIAG  ANGIO  01/15/2022   IR PTA INTRACRANIAL  01/15/2022   IR US GUIDE VASC ACCESS RIGHT  01/15/2022   RADIOLOGY WITH ANESTHESIA N/A 01/15/2022   Procedure: RADIOLOGY WITH ANESTHESIA;  Surgeon: Julieanne Cotton, MD;  Location: [facility] OR;  Service: Radiology;  Laterality: N/A;   T9-T11 fusion in 1990       Home Medications:  Prior to Admission medications   Medication Sig Start Date End Date Taking? Authorizing Provider  docusate sodium (COLACE) 100 MG capsule Take 1 capsule (100 mg total) by mouth 2 (two) times daily. Patient not taking: Reported on 11/14/2020 10/19/20   Evon Slack, PA-C  enoxaparin (LOVENOX) 40 MG/0.4ML injection Inject 0.4 mLs (40 mg total) into the skin daily for 14 days. Patient not taking: Reported on 01/16/2022 10/19/20 11/02/20  Evon Slack, PA-C  HYDROcodone-acetaminophen (NORCO/VICODIN) 5-325 MG tablet Take 1 tablet by mouth every 4 (four) hours as needed for moderate pain (pain score 4-6). Patient not taking: Reported on 11/14/2020 10/19/20   Evon Slack, PA-C  senna-docusate (SENOKOT-S) 8.6-50 MG tablet Take 1 tablet by mouth 2 (two) times daily between meals as needed for mild constipation. Patient not taking: Reported on 11/14/2020 10/22/20   Almon Hercules, MD  traZODone (DESYREL) 50 MG tablet Take 1 tablet (50 mg total) by mouth at bedtime. Patient not taking: Reported on 11/14/2020 10/22/20   Almon Hercules, MD  Vitamin D, Ergocalciferol, (  DRISDOL) 1.25 MG (50000 UNIT) CAPS capsule Take 1 capsule (50,000 Units total) by mouth every 7 (seven) days. Patient not taking: Reported on 11/14/2020 10/26/20   Almon Hercules, MD    Inpatient Medications: Scheduled Meds:  amLODipine  5 mg Oral Daily   aspirin EC  81 mg Oral Daily   Chlorhexidine Gluconate Cloth  6 each Topical Daily   clopidogrel  75 mg Oral Daily   enoxaparin (LOVENOX) injection  40 mg Subcutaneous Q24H   insulin aspart  0-9 Units Subcutaneous TID WC   pantoprazole  40 mg Oral Daily   QUEtiapine  25 mg Oral  QHS   rosuvastatin  20 mg Oral Daily   Continuous Infusions:  PRN Meds: acetaminophen **OR** acetaminophen (TYLENOL) oral liquid 160 mg/5 mL **OR** acetaminophen, haloperidol lactate, labetalol, senna-docusate  Allergies:    Allergies  Allergen Reactions   Penicillins Other (See Comments)    Unknown     Social History:   Social History   Socioeconomic History   Marital status: Single    Spouse name: Not on file   Number of children: Not on file   Years of education: Not on file   Highest education level: Not on file  Occupational History   Not on file  Tobacco Use   Smoking status: Former    Packs/day: 1.50    Years: 30.00    Pack years: 45.00    Types: Cigarettes    Quit date: 2001    Years since quitting: 22.4   Smokeless tobacco: Never  Substance and Sexual Activity   Alcohol use: Not Currently   Drug use: Never   Sexual activity: Not on file  Other Topics Concern   Not on file  Social History Narrative   Not on file   Social Determinants of Health   Financial Resource Strain: Not on file  Food Insecurity: Not on file  Transportation Needs: Not on file  Physical Activity: Not on file  Stress: Not on file  Social Connections: Not on file  Intimate Partner Violence: Not on file    Family History:   No family history on file.   ROS:  Please see the history of present illness.  Unable to assess with stroke, aphasia.      Physical Exam/Data:   Vitals:   01/18/22 0400 01/18/22 0839 01/18/22 1120 01/18/22 1210  BP: (!) 150/74 (!) 159/81 125/79 139/84  Pulse: 90 68 (!) 111 99  Resp: 20  (!) 27 20  Temp: 97.9 F (36.6 C) 97.8 F (36.6 C) 98.4 F (36.9 C) 98.6 F (37 C)  TempSrc: Axillary Axillary Oral   SpO2:   92% 99%  Weight:      Height:        Intake/Output Summary (Last 24 hours) at 01/18/2022 1402 Last data filed at 01/18/2022 0500 Gross per 24 hour  Intake 120 ml  Output 1350 ml  Net -1230 ml   Filed Weights   01/15/22 1906 01/15/22  1938  Weight: 82.4 kg 82.4 kg   Body mass index is 26.07 kg/m.  GENERAL: Chronically ill appearing HEENT:   Pupils equal round and reactive, fundi not visualized, oral mucosa unremarkable NECK:  No  jugular venous distention, waveform within normal limits, carotid upstroke brisk and symmetric, no bruits, no thyromegaly LYMPHATICS:  No cervical, inguinal adenopathy LUNGS:   Clear to auscultation bilaterally BACK:  No CVA tenderness CHEST:   Unremarkable HEART:  PMI not displaced or sustained,S1 and S2 within normal  limits, no S3, no S4, no clicks, no rubs, no murmurs ABD:  Flat, positive bowel sounds normal in frequency in pitch, no bruits, no rebound, no guarding, no midline pulsatile mass, no hepatomegaly, no splenomegaly EXT:  2 plus pulses throughout, no no edema, no cyanosis no clubbing, abrasions on his knee SKIN:  No rashes no nodules NEURO:   Right hemiparesis and aphasic PSYCH:  Unable to assess.  Physical, review of chart according insulin   EKG:  The EKG was personally reviewed and demonstrates: Sinus tachycardia, rate 104, left axis deviation, old anteroseptal infarct, no acute ST-T wave changes. Telemetry:  Telemetry was personally reviewed and demonstrates: Normal sinus rhythm with premature ventricular contractions.  Relevant CV Studies:  Echo:  01/16/2022   1. Left ventricular ejection fraction, by estimation, is 35 to 40%. The  left ventricle has moderately decreased function. The left ventricle  demonstrates global hypokinesis. There is severe concentric left  ventricular hypertrophy. Left ventricular  diastolic function could not be evaluated.   2. Right ventricular systolic function is normal. The right ventricular  size is normal.   3. A small pericardial effusion is present. The pericardial effusion is  anterior to the right ventricle. There is no evidence of cardiac  tamponade.   4. The mitral valve is normal in structure. No evidence of mitral valve   regurgitation. No evidence of mitral stenosis.   5. The aortic valve is normal in structure. Aortic valve regurgitation is  not visualized. No aortic stenosis is present.   6. 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   7. The inferior vena cava is normal in size with greater than 50%  respiratory variability, suggesting right atrial pressure of 3 mmHg.   Laboratory Data:  Chemistry Recent Labs  Lab 01/16/22 0449 01/17/22 0052 01/18/22 0100  NA 139 139 137  K 4.6 3.7 3.2*  CL 114* 110 109  CO2 13* 22 19*  GLUCOSE 127* 90 114*  BUN 19 17 14   CREATININE 0.85 0.93 0.87  CALCIUM 8.2* 8.4* 8.7*  GFRNONAA >60 >60 >60  ANIONGAP 12 7 9     Recent Labs  Lab 01/15/22 1908  PROT 6.7  ALBUMIN 3.9  AST 28  ALT 15  ALKPHOS 56  BILITOT 1.7*   Hematology Recent Labs  Lab 01/16/22 0449 01/17/22 0052 01/18/22 0100  WBC 11.2* 12.8* 10.7*  RBC 4.94 4.50 5.15  HGB 15.1 14.0 16.1  HCT 44.3 41.3 45.4  MCV 89.7 91.8 88.2  MCH 30.6 31.1 31.3  MCHC 34.1 33.9 35.5  RDW 13.2 13.2 12.9  PLT 177 277 297   Cardiac EnzymesNo results for input(s): TROPONINI in the last 168 hours. No results for input(s): TROPIPOC in the last 168 hours.  BNPNo results for input(s): BNP, PROBNP in the last 168 hours.  DDimer No results for input(s): DDIMER in the last 168 hours.  Radiology/Studies:    CXR:    FINDINGS: Aorta is ectatic in the heart is mildly enlarged, unchanged. The lungs are clear. There is no pleural effusion or pneumothorax. No acute fractures are seen.   IMPRESSION: 1. No acute cardiopulmonary process. No significant interval change.   Echo:    1. Left ventricular ejection fraction, by estimation, is 35 to 40%. The  left ventricle has moderately decreased function. The left ventricle  demonstrates global hypokinesis. There is severe concentric left  ventricular hypertrophy. Left ventricular  diastolic function could  not be  evaluated.   2. Right ventricular systolic function is normal. The right ventricular  size is normal.   3. A small pericardial effusion is present. The pericardial effusion is  anterior to the right ventricle. There is no evidence of cardiac  tamponade.   4. The mitral valve is normal in structure. No evidence of mitral valve  regurgitation. No evidence of mitral stenosis.   5. The aortic valve is normal in structure. Aortic valve regurgitation is  not visualized. No aortic stenosis is present.   6. 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   7. The inferior vena cava is normal in size with greater than 50%  respiratory variability, suggesting right atrial pressure of 3 mmHg.   Assessment and Plan:   Cardiomyopathy:  EF as above.  Allowing for some permissive hypertension I will discontinue his amlodipine and manage him with carvedilol and Entresto.  He seems to be euvolemic.  Further meds can be titrated over time.  Not planning an invasive work-up although his EKG might suggest an ischemic etiology.  I likely would manage this medically and follow-up with repeat echocardiography as an outpatient.  HTN: This will be managed in the context of managing his cardiomyopathy.  Dyslipidemia: I would agree with aggressive management with the LDL goal of 70.  Patient.  CVA: He is currently being managed with aspirin and Plavix.  I will do an outpatient 30-day monitor to exclude as best we can atrial fib.   For questions or updates, please contact [facility] Please consult [email or web address] for contact info under Cardiology/STEMI.   Signed, Rollene Rotunda, MD  01/18/2022 2:02 PM

## 2022-01-19 ENCOUNTER — Encounter (HOSPITAL_COMMUNITY): Payer: Self-pay | Admitting: Neurology

## 2022-01-19 ENCOUNTER — Other Ambulatory Visit: Payer: Self-pay

## 2022-01-19 LAB — CBC
HCT: 46.4 % (ref 39.0–52.0)
Hemoglobin: 16.5 g/dL (ref 13.0–17.0)
MCH: 31.2 pg (ref 26.0–34.0)
MCHC: 35.6 g/dL (ref 30.0–36.0)
MCV: 87.7 fL (ref 80.0–100.0)
Platelets: 336 10*3/uL (ref 150–400)
RBC: 5.29 MIL/uL (ref 4.22–5.81)
RDW: 13.2 % (ref 11.5–15.5)
WBC: 10.6 10*3/uL — ABNORMAL HIGH (ref 4.0–10.5)
nRBC: 0 % (ref 0.0–0.2)

## 2022-01-19 LAB — GLUCOSE, CAPILLARY
Glucose-Capillary: 118 mg/dL — ABNORMAL HIGH (ref 70–99)
Glucose-Capillary: 107 mg/dL — ABNORMAL HIGH (ref 70–99)
Glucose-Capillary: 115 mg/dL — ABNORMAL HIGH (ref 70–99)
Glucose-Capillary: 114 mg/dL — ABNORMAL HIGH (ref 70–99)

## 2022-01-19 LAB — BASIC METABOLIC PANEL
Anion gap: 7 (ref 5–15)
BUN: 25 mg/dL — ABNORMAL HIGH (ref 8–23)
CO2: 20 mmol/L — ABNORMAL LOW (ref 22–32)
Calcium: 9 mg/dL (ref 8.9–10.3)
Chloride: 113 mmol/L — ABNORMAL HIGH (ref 98–111)
Creatinine, Ser: 1.08 mg/dL (ref 0.61–1.24)
GFR, Estimated: >60 mL/min (ref >60)
Glucose, Bld: 116 mg/dL — ABNORMAL HIGH (ref 70–99)
Potassium: 4.1 mmol/L (ref 3.5–5.1)
Sodium: 140 mmol/L (ref 135–145)

## 2022-01-19 MED ORDER — CARVEDILOL 3.125 MG PO TABS
3.1250 mg | ORAL_TABLET | Freq: Two times a day (BID) | ORAL | Status: DC
  Administered 2022-01-19 – 2022-01-21 (×4): 3.125 mg via ORAL
  Filled 2022-01-19 (×4): qty 1

## 2022-01-19 MED ORDER — SACUBITRIL-VALSARTAN 24-26 MG PO TABS
1.0000 | ORAL_TABLET | Freq: Two times a day (BID) | ORAL | Status: DC
  Administered 2022-01-19 – 2022-01-21 (×5): 1 via ORAL
  Filled 2022-01-19 (×7): qty 1

## 2022-01-19 NOTE — Progress Notes (Signed)
Progress Note  Patient Name: Casey Rangel Date of Encounter: 01/19/2022  Primary Cardiologist:   None   Subjective   Answers only with "no."  Denies pain  Inpatient Medications    Scheduled Meds:  amLODipine  5 mg Oral Daily   aspirin EC  81 mg Oral Daily   Chlorhexidine Gluconate Cloth  6 each Topical Daily   clopidogrel  75 mg Oral Daily   enoxaparin (LOVENOX) injection  40 mg Subcutaneous Q24H   insulin aspart  0-9 Units Subcutaneous TID WC   pantoprazole  40 mg Oral Daily   potassium chloride  40 mEq Oral BID   QUEtiapine  25 mg Oral QHS   rosuvastatin  20 mg Oral Daily   Continuous Infusions:  PRN Meds: acetaminophen **OR** acetaminophen (TYLENOL) oral liquid 160 mg/5 mL **OR** acetaminophen, haloperidol lactate, labetalol, senna-docusate   Vital Signs    Vitals:   01/18/22 1920 01/18/22 2315 01/19/22 0320 01/19/22 0721  BP: (!) 107/91 129/90 121/86 (!) 157/89  Pulse: (!) 103 100 96 76  Resp: 20 20 16 19   Temp: 98.4 F (36.9 C) 99 F (37.2 C) 98.7 F (37.1 C) (!) 97.4 F (36.3 C)  TempSrc: Oral Axillary Axillary Oral  SpO2: 100% 96% 91% 96%  Weight:      Height:        Intake/Output Summary (Last 24 hours) at 01/19/2022 1054 Last data filed at 01/19/2022 4782 Gross per 24 hour  Intake --  Output 600 ml  Net -600 ml   Filed Weights   01/15/22 1906 01/15/22 1938  Weight: 82.4 kg 82.4 kg    Telemetry    NSR - Personally Reviewed  ECG    NA - Personally Reviewed  Physical Exam   GEN: No acute distress.   Neck: No  JVD Cardiac: RRR, no murmurs, rubs, or gallops.  Respiratory: Clear  to auscultation bilaterally. GI: Soft, nontender, non-distended  MS: No  edema; No deformity.  Labs    Chemistry Recent Labs  Lab 01/15/22 1908 01/15/22 1914 01/17/22 0052 01/18/22 0100 01/19/22 0257  NA 141   < > 139 137 140  K 4.1   < > 3.7 3.2* 4.1  CL 112*   < > 110 109 113*  CO2 16*   < > 22 19* 20*  GLUCOSE 102*   < > 90 114* 116*  BUN 28*    < > 17 14 25*  CREATININE 0.98   < > 0.93 0.87 1.08  CALCIUM 9.1   < > 8.4* 8.7* 9.0  PROT 6.7  --   --   --   --   ALBUMIN 3.9  --   --   --   --   AST 28  --   --   --   --   ALT 15  --   --   --   --   ALKPHOS 56  --   --   --   --   BILITOT 1.7*  --   --   --   --   GFRNONAA >60   < > >60 >60 >60  ANIONGAP 13   < > 7 9 7    < > = values in this interval not displayed.     Hematology Recent Labs  Lab 01/17/22 0052 01/18/22 0100 01/19/22 0257  WBC 12.8* 10.7* 10.6*  RBC 4.50 5.15 5.29  HGB 14.0 16.1 16.5  HCT 41.3 45.4 46.4  MCV 91.8 88.2 87.7  MCH 31.1 31.3 31.2  MCHC 33.9 35.5 35.6  RDW 13.2 12.9 13.2  PLT 277 297 336    Cardiac EnzymesNo results for input(s): TROPONINI in the last 168 hours. No results for input(s): TROPIPOC in the last 168 hours.   BNPNo results for input(s): BNP, PROBNP in the last 168 hours.   DDimer No results for input(s): DDIMER in the last 168 hours.   Radiology    VAS [place] LOWER EXTREMITY VENOUS (DVT)  Result Date: 01/17/2022  Lower Venous DVT Study Patient Name:  MARQUEL DENT  Date of Exam:   01/17/2022 Medical Rec #: [id number]       Accession #:    [id number] Date of Birth: [birth year]       Patient Gender: M Patient Age:   76 years Exam Location:  [facility] Procedure:      VAS [place] LOWER EXTREMITY VENOUS (DVT) Referring Phys: Scheryl Marten XU --------------------------------------------------------------------------------  Indications: Embolic stroke, history of hip fracture.  Comparison Study: No prior studies. Performing Technologist: Jean Rosenthal RDMS, RVT  Examination Guidelines: A complete evaluation includes B-mode imaging, spectral Doppler, color Doppler, and power Doppler as needed of all accessible portions of each vessel. Bilateral testing is considered an integral part of a complete examination. Limited examinations for reoccurring indications may be performed as noted. The reflux portion of the exam is performed with the patient in  reverse Trendelenburg.  +---------+---------------+---------+-----------+----------+--------------+ RIGHT    CompressibilityPhasicitySpontaneityPropertiesThrombus Aging +---------+---------------+---------+-----------+----------+--------------+ CFV      Full           Yes      Yes                                 +---------+---------------+---------+-----------+----------+--------------+ SFJ      Full                                                        +---------+---------------+---------+-----------+----------+--------------+ FV Prox  Full                                                        +---------+---------------+---------+-----------+----------+--------------+ FV Mid   Full                                                        +---------+---------------+---------+-----------+----------+--------------+ FV DistalFull                                                        +---------+---------------+---------+-----------+----------+--------------+ PFV      Full                                                        +---------+---------------+---------+-----------+----------+--------------+  POP      Full           Yes      Yes                                 +---------+---------------+---------+-----------+----------+--------------+ PTV      Full                                                        +---------+---------------+---------+-----------+----------+--------------+ PERO     Full                                                        +---------+---------------+---------+-----------+----------+--------------+ Gastroc  Full                                                        +---------+---------------+---------+-----------+----------+--------------+   +---------+---------------+---------+-----------+----------+--------------+ LEFT     CompressibilityPhasicitySpontaneityPropertiesThrombus Aging  +---------+---------------+---------+-----------+----------+--------------+ CFV      Full           Yes      Yes                                 +---------+---------------+---------+-----------+----------+--------------+ SFJ      Full                                                        +---------+---------------+---------+-----------+----------+--------------+ FV Prox  Full                                                        +---------+---------------+---------+-----------+----------+--------------+ FV Mid   Full                                                        +---------+---------------+---------+-----------+----------+--------------+ FV DistalFull                                                        +---------+---------------+---------+-----------+----------+--------------+ PFV      Full                                                        +---------+---------------+---------+-----------+----------+--------------+  POP      Full           Yes      Yes                                 +---------+---------------+---------+-----------+----------+--------------+ PTV      Full                                                        +---------+---------------+---------+-----------+----------+--------------+ PERO     Full                                                        +---------+---------------+---------+-----------+----------+--------------+ Gastroc  Full                                                        +---------+---------------+---------+-----------+----------+--------------+     Summary: RIGHT: - There is no evidence of deep vein thrombosis in the lower extremity.  - No cystic structure found in the popliteal fossa.  LEFT: - There is no evidence of deep vein thrombosis in the lower extremity.  - No cystic structure found in the popliteal fossa.  *See table(s) above for measurements and observations. Electronically signed  by Waverly Ferrari MD on 01/17/2022 at 3:36:15 PM.    Final     Cardiac Studies   ECHO:  1. Left ventricular ejection fraction, by estimation, is 35 to 40%. The  left ventricle has moderately decreased function. The left ventricle  demonstrates global hypokinesis. There is severe concentric left  ventricular hypertrophy. Left ventricular  diastolic function could not be evaluated.   2. Right ventricular systolic function is normal. The right ventricular  size is normal.   3. A small pericardial effusion is present. The pericardial effusion is  anterior to the right ventricle. There is no evidence of cardiac  tamponade.   4. The mitral valve is normal in structure. No evidence of mitral valve  regurgitation. No evidence of mitral stenosis.   5. The aortic valve is normal in structure. Aortic valve regurgitation is  not visualized. No aortic stenosis is present.   6. 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   7. The inferior vena cava is normal in size with greater than 50%  respiratory variability, suggesting right atrial pressure of 3 mmHg.    Patient Profile     71 y.o. male without a cardiac history who is being seen today for the evaluation of cardiomyopathy at the request of Dr. Pearlean Brownie.  Assessment & Plan    Cardiomyopathy:  EF as above.   Start Coreg and [place].  Norvasc discontinued.  Follow as an out patient for further testing.     HTN:   This is being managed in the context of treating his CHF  Dyslipidemia:     Continue Crestor.     CVA: He is currently  being managed with aspirin and Plavix.  Will plan out patient monitor.  Possibly will be able to put it on before going to rehab .    For questions or updates, please contact [facility] Please consult [email or web address] for contact info under Cardiology/STEMI.   Signed, Rollene Rotunda, MD  01/19/2022, 10:54 AM

## 2022-01-19 NOTE — Progress Notes (Signed)
Physical Therapy Treatment Patient Details Name: Casey Rangel MRN: [id number] DOB: [birth year] Today's Date: 01/19/2022   History of Present Illness 71 yo admitted with acute R sided weakness and aphasia, found down non-responsive by his sister. 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 MRI pending. PMH L hip fx ; T9-11 fusion    PT Comments    Patient continues to participate and tolerate therapy well. Ambulating 115 feet with Mod assist for walker control, balance, and RUE support on walker. Mod assist for bed mobility, posterior and Rt ward lean. Seated balance during functional activities at EOB with improved postural control, self corrected instability 4/5 times. BP 150/88, HR 94, SpO2 97%,  Recommendations for follow up therapy are one component of a multi-disciplinary discharge planning process, led by the attending physician.  Recommendations may be updated based on patient status, additional functional criteria and insurance authorization.  Follow Up Recommendations  Acute inpatient rehab (3hours/day)     Assistance Recommended at Discharge Frequent or constant Supervision/Assistance  Patient can return home with the following A lot of help with walking and/or transfers;A lot of help with bathing/dressing/bathroom;Assistance with cooking/housework;Assist for transportation;Help with stairs or ramp for entrance;Direct supervision/assist for medications management;Direct supervision/assist for financial management   Equipment Recommendations  Other (comment) (TBD)    Recommendations for Other Services Rehab consult     Precautions / Restrictions Precautions Precautions: Fall Precaution Comments: BP parameters Restrictions Weight Bearing Restrictions: No     Mobility  Bed Mobility Overal bed mobility: Needs Assistance Bed Mobility: Supine to Sit Rolling: Mod assist Sidelying to sit: Mod assist       General bed mobility comments: Mod assist to rise to  EOB, pt impulsive trying to get up rapidly but falling backwards and towards Rt, requires verbal and tactile cues. Mod assist for LEs and trunk back into bed. Did well with scooting up into bed after lying down, did not require physical assist.    Transfers Overall transfer level: Needs assistance Equipment used: Rolling walker (2 wheels) Transfers: Sit to/from Stand Sit to Stand: Min assist           General transfer comment: Min assist for boost to stand. Initial trial with posterior LOB. Second trail improved stability. Assist for RUE placement onto RW upon standing.    Ambulation/Gait Ambulation/Gait assistance: Mod assist Gait Distance (Feet): 115 Feet Assistive device: Rolling walker (2 wheels) Gait Pattern/deviations: Step-through pattern, Step-to pattern, Decreased step length - right Gait velocity: decreased     General Gait Details: Mild apraxia initiating gait, facilitatory cues required. Mod assist for balance and RW control, Rt hand held to RW for symmetry. No buckling noted. Reduced awareness of obstacles. Cues to identify variou objects in hallway with little/no response.   Stairs             Wheelchair Mobility    Modified Rankin (Stroke Patients Only) Modified Rankin (Stroke Patients Only) Pre-Morbid Rankin Score: Slight disability Modified Rankin: Moderately severe disability     Balance Overall balance assessment: Needs assistance Sitting-balance support: Feet supported Sitting balance-Leahy Scale: Fair Sitting balance - Comments: leaning R at times   Standing balance support: Bilateral upper extremity supported, During functional activity Standing balance-Leahy Scale: Poor Standing balance comment: UE support and assist for balance                            Cognition Arousal/Alertness: Awake/alert Behavior During Therapy: Impulsive Overall Cognitive Status: Difficult to  assess                                  General Comments: follows gesturing cues but needs close S for safety due to impulsivity; HOH and aphasic        Exercises      General Comments General comments (skin integrity, edema, etc.): Tolerated prolonged period sitting EOB while listening to family member on the phone followed by close guard supervision balancing on EOB while eating lunch with RUE supported on tray table. Pt with intermittent Rtward LOB but able to self correct 4/5 times.      Pertinent Vitals/Pain Pain Assessment Pain Assessment: Faces Faces Pain Scale: No hurt    Home Living                          Prior Function            PT Goals (current goals can now be found in the care plan section) Acute Rehab PT Goals Patient Stated Goal: unable to state due to aphasia PT Goal Formulation: With patient Time For Goal Achievement: 01/30/22 Potential to Achieve Goals: Good Progress towards PT goals: Progressing toward goals    Frequency    Min 4X/week      PT Plan Current plan remains appropriate    Co-evaluation              AM-PAC PT "6 Clicks" Mobility   Outcome Measure  Help needed turning from your back to your side while in a flat bed without using bedrails?: A Lot Help needed moving from lying on your back to sitting on the side of a flat bed without using bedrails?: A Lot Help needed moving to and from a bed to a chair (including a wheelchair)?: A Lot Help needed standing up from a chair using your arms (e.g., wheelchair or bedside chair)?: A Lot Help needed to walk in hospital room?: A Lot Help needed climbing 3-5 steps with a railing? : Total 6 Click Score: 11    End of Session Equipment Utilized During Treatment: Gait belt Activity Tolerance: Patient tolerated treatment well Patient left: with call bell/phone within reach;with restraints reapplied;in bed;with bed alarm set;with nursing/sitter in room Nurse Communication: Mobility status PT Visit Diagnosis:  Unsteadiness on feet (R26.81);Muscle weakness (generalized) (M62.81);Hemiplegia and hemiparesis Hemiplegia - Right/Left: Right Hemiplegia - dominant/non-dominant: Dominant Hemiplegia - caused by: Cerebral infarction     Time: 1349-1433 PT Time Calculation (min) (ACUTE ONLY): 44 min  Charges:  $Gait Training: 8-22 mins $Therapeutic Activity: 8-22 mins $Neuromuscular Re-education: 8-22 mins                     Kathlyn Sacramento, PT     Berton Mount 01/19/2022, 2:38 PM

## 2022-01-19 NOTE — Progress Notes (Signed)
Speech Language Pathology Treatment: Dysphagia;Cognitive-Linquistic (aphasia)  Patient Details Name: Casey Rangel MRN: [id number] DOB: 04/14/1951 Today's Date: 01/19/2022 Time: 9233-0076 SLP Time Calculation (min) (ACUTE ONLY): 28 min  Assessment / Plan / Recommendation Clinical Impression  Pt was seen for treatment. He was alert and cooperative during the session and denied pain. Pt was seen with breakfast and consumed a meal of scrambled eggs, potatoes, and oatmeal. Pt tolerated these and thin liquids via straw without overt s/sx of aspiration with the exception of once with a large bolus of harder scrambled eggs. Mild lingual residue was cleared with a liquid wash. Pt's RN arrived during the session and pt tolerated multiple pills at once with thin liquids without difficulty. With verbal prompts, repetition, and visual cues, pt responded to simple yes/no questions with 80% accuracy. Verbal output continues to be improved with intermittent production of two-word phrases and continued use of single-word utterances. Some approximation of nouns was noted during the meals when pt attempted to name items from the tray. SLP will continue to follow pt.     HPI HPI: Pt is a 71 y.o. male who presented with aphasia and right-sided weakness. MRI brain 5/31: Patchy acute ischemic nonhemorrhagic left MCA distribution  infarct, watershed in distribution. 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 Pt s/p angiography, thrombectomy and angioplasty 5/31. PMH: avoidance of healthcare, left hip fracture, intermittent memory impairment.      SLP Plan  Continue with current plan of care      Recommendations for follow up therapy are one component of a multi-disciplinary discharge planning process, led by the attending physician.  Recommendations may be updated based on patient status, additional functional criteria and insurance authorization.    Recommendations  Diet recommendations:  Dysphagia 3 (mechanical soft);Thin liquid Liquids provided via: Cup;Straw Medication Administration: Whole meds with liquid Supervision: Staff to assist with self feeding Compensations: Slow rate;Small sips/bites;Follow solids with liquid Postural Changes and/or Swallow Maneuvers: Seated upright 90 degrees                Oral Care Recommendations: Oral care BID Follow Up Recommendations: Acute inpatient rehab (3hours/day) Assistance recommended at discharge: Frequent or constant Supervision/Assistance SLP Visit Diagnosis: Dysphagia, oropharyngeal phase (R13.12) Plan: Continue with current plan of care         Yong Grieser I. Vear Clock, MS, CCC-SLP Acute Rehabilitation Services Office number [phone] Pager [phone]   Scheryl Marten  01/19/2022, 10:12 AM

## 2022-01-19 NOTE — Progress Notes (Addendum)
STROKE TEAM PROGRESS NOTE   INTERVAL HISTORY No family at the bedside. More interactive today. Speaking some single/combinations of words Casey Rangel, Casey Rangel, Casey peel).  BP goal less than 180. CM to discuss SNF vs CIR with family. He is seen sitting up in the chair, able to feed himself.  Vital signs stable.  Neurological exam unchanged.    Vitals:   01/18/22 2315 01/19/22 0320 01/19/22 0721 01/19/22 1208  BP: 129/90 121/86 (!) 157/89   Pulse: 100 96 76 85  Resp: 20 16 19    Temp: 99 F (37.2 C) 98.7 F (37.1 C) (!) 97.4 F (36.3 C) 97.9 F (36.6 C)  TempSrc: Axillary Axillary Oral Oral  SpO2: 96% 91% 96%   Weight:      Height:       CBC:  Recent Labs  Lab 01/15/22 1908 01/15/22 1914 01/18/22 0100 01/19/22 0257  WBC 16.3*   < > 10.7* 10.6*  NEUTROABS 13.9*  --   --   --   HGB 16.4   < > 16.1 16.5  HCT 49.3   < > 45.4 46.4  MCV 94.6   < > 88.2 87.7  PLT 277   < > 297 336   < > = values in this interval not displayed.    Basic Metabolic Panel:  Recent Labs  Lab 01/18/22 0100 01/19/22 0257  NA 137 140  K 3.2* 4.1  CL 109 113*  CO2 19* 20*  GLUCOSE 114* 116*  BUN 14 25*  CREATININE 0.87 1.08  CALCIUM 8.7* 9.0    Lipid Panel:  Recent Labs  Lab 01/16/22 0449  CHOL 178  TRIG 43  HDL 40*  CHOLHDL 4.5  VLDL 9  LDLCALC 129*    HgbA1c:  Recent Labs  Lab 01/16/22 0449  HGBA1C 5.1    Urine Drug Screen:  Recent Labs  Lab 01/15/22 1908  LABOPIA NONE DETECTED  COCAINSCRNUR NONE DETECTED  LABBENZ NONE DETECTED  AMPHETMU NONE DETECTED  THCU NONE DETECTED  LABBARB NONE DETECTED     Alcohol Level  Recent Labs  Lab 01/15/22 1930  ETH <10     IMAGING past 24 hours No results found.  PHYSICAL EXAM  Physical Exam  Constitutional: Appears well-developed and well-nourished elderly male not in distress.  Cardiovascular: Normal rate and regular rhythm.  Respiratory: Effort normal, breathing unlabored on room air  Neuro: Mental  Status: Patient is awake, alert globally aphasic Speaks occasional words but unable to speak sentences.  Follows simple midline and one-step commands.  States name, location, and able to identify "Casey peel" cranial Nerves: II: Visual Fields are full. Pupils are equal, round, and reactive to light.   III,IV, VI: EOMI without ptosis or diploplia.  V: Facial sensation is symmetric to temperature VII: Facial movement is symmetric resting and smiling VIII: Hearing is intact to voice X: Palate elevates symmetrically XI: Shoulder shrug strong on the left XII:  Motor: Tone is normal. Bulk is normal.  RUE 0/5 LUE 5/5 RLE 3/5 LLE 5/5 Sensory: Sensation is symmetric to light touch and temperature in the arms and legs. No extinction to DSS present.  Withdrawal to pain in all extremities Cerebellar: Unable to participate, no ataxia noted in left upper or lower extremity   ASSESSMENT/PLAN Casey Rangel is a 71 y.o. male with history of GERD, left hip fracture presenting with global aphasia and right sided weakness and 3 days of increased confusion. Taken for a mechanical thrombectomy and angioplasty. Start DAPT  therapy with ASA 81mg  and plavix 75mg  after MRI. Passed MBS. Will recommend cardiology consult for cardiomyopathies, check LE venous Doppler, and recommend 30-day cardiac event monitor as outpatient. Cardiology consult placed, appreciate recommendations.  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 Cardioembolic source  Code Stroke CT head No acute abnormality. old infarct left parieto-occipital, right PCA, right MCA/ACA  CTA head & neck severe near occlusive stenosis at the Lt ICA terminus with radiographic string sign.  CT perfusion 190 mL perfusion deficit within the left MCA distribution  MRI  patchy watershed distrubution stroke in left MCA territory S/p IR with mechanical thrombectomy and  angioplasty of left ICA terminus MRA  The previously demonstrated high-grade stenosis of the supraclinoid left ICA is no longer appreciated. 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 2D Echo EF 35 to 40% LE venous Doppler no DVT We will consider 30-day CardioNet monitoring as outpatient to rule out A-fib LDL 129 HgbA1c 5.1 VTE prophylaxis -Lovenox No antithrombotic prior to admission, ASA 81mg  and Plavix 75mg  DAPT for 3 weeks and then ASA alone.  Therapy recommendations: CIR Disposition:  pending  Cardiomyopathy 2D echo EF 35 to 40% No prior records to compare Cardiology consulted 6/3  Hypertension BP goal < 180/105 Stable now Long-term BP goal normotensive Off Cleviprex Start norvasc 5mg   Hyperlipidemia Home meds:  None LDL 129, goal < 70 Add crestor 20mg  Continue statin at discharge  Sundowning  Agitation 6/2 evening Needed haldol  IV PRN Put on seroquel 25mg  Qhs  Other Stroke Risk Factors Advanced Age >/= 71   Other Active Problems Dysphagia - now pass swallow, on dys3 and thin SLP continuing to follow to advance diet Acute hypoxic respiratory insufficiency- resolved   Rangel day # 4  Patient seen and examined by NP/APP with MD. MD to update note as needed.   Janine Ores, DNP, FNP-BC [facility] Pager: [phone]  I have personally obtained history,examined this patient, reviewed notes, independently viewed imaging studies, participated in medical decision making and plan of care.ROS completed by me personally and pertinent positives fully documented  I have made any additions or clarifications directly to the above note. Agree with note above.  Patient continues to show slow improvement.  Continue ongoing therapies.  Likely transfer to rehab next week when bed available.  Discussed with Dr. Percival Spanish cardiology.  Antony Contras, MD Medical Director [facility] St[facility] Pager: [phone] 01/19/2022 3:30 PM    To  contact Stroke Continuity provider, please refer to [email or web address] After hours, contact General Neurology

## 2022-01-20 DIAGNOSIS — E440 Moderate protein-calorie malnutrition: Secondary | ICD-10-CM | POA: Insufficient documentation

## 2022-01-20 DIAGNOSIS — I5041 Acute combined systolic (congestive) and diastolic (congestive) heart failure: Secondary | ICD-10-CM

## 2022-01-20 LAB — CBC
HCT: 50.6 % (ref 39.0–52.0)
Hemoglobin: 17.8 g/dL — ABNORMAL HIGH (ref 13.0–17.0)
MCH: 31.2 pg (ref 26.0–34.0)
MCHC: 35.2 g/dL (ref 30.0–36.0)
MCV: 88.6 fL (ref 80.0–100.0)
Platelets: 374 10*3/uL (ref 150–400)
RBC: 5.71 MIL/uL (ref 4.22–5.81)
RDW: 13.4 % (ref 11.5–15.5)
WBC: 11.9 10*3/uL — ABNORMAL HIGH (ref 4.0–10.5)
nRBC: 0 % (ref 0.0–0.2)

## 2022-01-20 LAB — BASIC METABOLIC PANEL
Anion gap: 10 (ref 5–15)
BUN: 24 mg/dL — ABNORMAL HIGH (ref 8–23)
CO2: 18 mmol/L — ABNORMAL LOW (ref 22–32)
Calcium: 9 mg/dL (ref 8.9–10.3)
Chloride: 112 mmol/L — ABNORMAL HIGH (ref 98–111)
Creatinine, Ser: 0.98 mg/dL (ref 0.61–1.24)
GFR, Estimated: >60 mL/min (ref >60)
Glucose, Bld: 107 mg/dL — ABNORMAL HIGH (ref 70–99)
Potassium: 4.2 mmol/L (ref 3.5–5.1)
Sodium: 140 mmol/L (ref 135–145)

## 2022-01-20 LAB — GLUCOSE, CAPILLARY
Glucose-Capillary: 110 mg/dL — ABNORMAL HIGH (ref 70–99)
Glucose-Capillary: 109 mg/dL — ABNORMAL HIGH (ref 70–99)
Glucose-Capillary: 117 mg/dL — ABNORMAL HIGH (ref 70–99)
Glucose-Capillary: 118 mg/dL — ABNORMAL HIGH (ref 70–99)

## 2022-01-20 MED ORDER — ADULT MULTIVITAMIN W/MINERALS CH
1.0000 | ORAL_TABLET | Freq: Every day | ORAL | Status: DC
  Administered 2022-01-20 – 2022-01-21 (×2): 1 via ORAL
  Filled 2022-01-20 (×2): qty 1

## 2022-01-20 MED ORDER — ENSURE ENLIVE PO LIQD
237.0000 mL | Freq: Two times a day (BID) | ORAL | Status: DC
  Administered 2022-01-20 – 2022-01-21 (×2): 237 mL via ORAL

## 2022-01-20 NOTE — NC FL2 (Addendum)
[place] MEDICAID FL2 LEVEL OF CARE SCREENING TOOL     IDENTIFICATION  Patient Name: Casey Rangel Birthdate: 01/28/1951 Sex: male Admission Date (Current Location): 01/15/2022  [facility] and [place] Number:  Producer, television/film/video and Address:  [facility] [place], [address] [place], [place] [address]      Provider Number: [id number]  Attending Physician Name and Address:  Stroke, Md, MD  Relative Name and Phone Number:       Current Level of Care: Hospital Recommended Level of Care: Skilled Nursing Facility Prior Approval Number:    Date Approved/Denied:   PASRR Number: [id number]  Discharge Plan: SNF    Current Diagnoses: Patient Active Problem List   Diagnosis Date Noted   Malnutrition of moderate degree 01/20/2022   Acute ischemic left MCA stroke (HCC) 01/15/2022   Primary hypertension 11/14/2020   Closed left hip fracture (HCC) 10/17/2020   Left hip pain 10/17/2020   GERD (gastroesophageal reflux disease)    Prolonged QT interval    Elevated troponin    Anemia    Fall     Orientation RESPIRATION BLADDER Height & Weight     Self  Normal Incontinent Weight: 181 lb 10.5 oz (82.4 kg) Height:  5\' 10"  (177.8 cm)  BEHAVIORAL SYMPTOMS/MOOD NEUROLOGICAL BOWEL NUTRITION STATUS      Incontinent Diet (see DC summary)  AMBULATORY STATUS COMMUNICATION OF NEEDS Skin   Limited Assist Verbally Surgical wounds (closed groin)                       Personal Care Assistance Level of Assistance  Bathing, Feeding, Dressing Bathing Assistance: Limited assistance Feeding assistance: Limited assistance Dressing Assistance: Limited assistance     Functional Limitations Info  Hearing, Speech   Hearing Info: Impaired Speech Info: Impaired    SPECIAL CARE FACTORS FREQUENCY  PT (By licensed PT), OT (By licensed OT), Speech therapy     PT Frequency: 5x/wk OT Frequency: 5x/wk     Speech Therapy Frequency: 5x/wk      Contractures  Contractures Info: Not present    Additional Factors Info  Code Status, Allergies, Psychotropic, Insulin Sliding Scale Code Status Info: Full Allergies Info: Penicillins Psychotropic Info: Seroquel 25mg  daily at bed Insulin Sliding Scale Info: see DC summary       Current Medications (01/20/2022):  This is the current hospital active medication list Current Facility-Administered Medications  Medication Dose Route Frequency Provider Last Rate Last Admin   acetaminophen (TYLENOL) tablet 650 mg  650 mg Oral Q4H PRN de Melchor Amour, Jerilynn Mages, MD   650 mg at 01/20/22 1248   Or   acetaminophen (TYLENOL) 160 MG/5ML solution 650 mg  650 mg Per Tube Q4H PRN de Melchor Amour, Jerilynn Mages, MD       Or   acetaminophen (TYLENOL) suppository 650 mg  650 mg Rectal Q4H PRN de Melchor Amour, Jerilynn Mages, MD       aspirin EC tablet 81 mg  81 mg Oral Daily Shafer, Ludger Nutting, NP   81 mg at 01/20/22 0837   carvedilol (COREG) tablet 3.125 mg  3.125 mg Oral BID WC Rollene Rotunda, MD   3.125 mg at 01/20/22 5621   Chlorhexidine Gluconate Cloth 2 % PADS 6 each  6 each Topical Daily Bhagat, Srishti L, MD   6 each at 01/19/22 1214   clopidogrel (PLAVIX) tablet 75 mg  75 mg Oral Daily Elmer Picker, NP   75 mg at 01/20/22 0837   enoxaparin (  LOVENOX) injection 40 mg  40 mg Subcutaneous Q24H Marvel Plan, MD   40 mg at 01/19/22 1802   feeding supplement (ENSURE ENLIVE / ENSURE PLUS) liquid 237 mL  237 mL Oral BID BM Micki Riley, MD       haloperidol lactate (HALDOL) injection 5 mg  5 mg Intravenous Q6H PRN Marvel Plan, MD       insulin aspart (novoLOG) injection 0-9 Units  0-9 Units Subcutaneous TID WC Marvel Plan, MD   2 Units at 01/18/22 1201   labetalol (NORMODYNE) injection 5-20 mg  5-20 mg Intravenous Q2H PRN Marvel Plan, MD       multivitamin with minerals tablet 1 tablet  1 tablet Oral Daily Micki Riley, MD       pantoprazole (PROTONIX) EC tablet 40 mg  40 mg Oral Daily Marvel Plan, MD   40 mg at 01/20/22  0837   potassium chloride (KLOR-CON) packet 40 mEq  40 mEq Oral BID Micki Riley, MD   40 mEq at 01/20/22 8413   QUEtiapine (SEROQUEL) tablet 25 mg  25 mg Oral QHS Marvel Plan, MD   25 mg at 01/19/22 1802   rosuvastatin (CRESTOR) tablet 20 mg  20 mg Oral Daily Marvel Plan, MD   20 mg at 01/20/22 2440   sacubitril-valsartan (ENTRESTO) 24-26 mg per tablet  1 tablet Oral BID Rollene Rotunda, MD   1 tablet at 01/20/22 1027   senna-docusate (Senokot-S) tablet 1 tablet  1 tablet Oral QHS PRN Gordy Councilman, MD         Discharge Medications: Please see discharge summary for a list of discharge medications.  Relevant Imaging Results:  Relevant Lab Results:   Additional Information SS#: [id number]  Baldemar Lenis, LCSW   I have personally obtained history,examined this patient, reviewed notes, independently viewed imaging studies, participated in medical decision making and plan of care.ROS completed by me personally and pertinent positives fully documented  I have made any additions or clarifications directly to the above note. Agree with note above.    Delia Heady, MD Medical Director [facility] Stroke Center Pager: [phone] 01/20/2022 4:59 PM

## 2022-01-20 NOTE — Care Management Important Message (Signed)
Important Message  Patient Details  Name: Casey Rangel MRN: [id number] Date of Birth: 09-Aug-1951   Medicare Important Message Given:  Yes     Timur Nibert [name] 01/20/2022, 3:45 PM

## 2022-01-20 NOTE — Progress Notes (Addendum)
Progress Note  Patient Name: Casey Rangel Date of Encounter: 01/20/2022  [facility] Cardiologist: Rollene RotundaJames Hochrein, MD   Subjective   Pt does not answer all questions, will answer "no"  Inpatient Medications    Scheduled Meds:  aspirin EC  81 mg Oral Daily   carvedilol  3.125 mg Oral BID WC   Chlorhexidine Gluconate Cloth  6 each Topical Daily   clopidogrel  75 mg Oral Daily   enoxaparin (LOVENOX) injection  40 mg Subcutaneous Q24H   insulin aspart  0-9 Units Subcutaneous TID WC   pantoprazole  40 mg Oral Daily   potassium chloride  40 mEq Oral BID   QUEtiapine  25 mg Oral QHS   rosuvastatin  20 mg Oral Daily   sacubitril-valsartan  1 tablet Oral BID   Continuous Infusions:  PRN Meds: acetaminophen **OR** acetaminophen (TYLENOL) oral liquid 160 mg/5 mL **OR** acetaminophen, haloperidol lactate, labetalol, senna-docusate   Vital Signs    Vitals:   01/19/22 1950 01/19/22 2326 01/20/22 0330 01/20/22 0806  BP: (!) 164/97 (!) 132/94 (!) 149/85 (!) 146/88  Pulse: (!) 104 98 86 91  Resp: 20 19 20  (!) 30  Temp: 98.5 F (36.9 C) 98.5 F (36.9 C) 98.4 F (36.9 C) 98.2 F (36.8 C)  TempSrc: Axillary Axillary Axillary Oral  SpO2: 96% 94% 97% 98%  Weight:      Height:        Intake/Output Summary (Last 24 hours) at 01/20/2022 1127 Last data filed at 01/20/2022 0932 Gross per 24 hour  Intake 160 ml  Output 500 ml  Net -340 ml      01/15/2022    7:38 PM 01/15/2022    7:06 PM 11/14/2020   11:25 AM  Last 3 Weights  Weight (lbs) 181 lb 10.5 oz 181 lb 10.5 oz 192 lb 8 oz  Weight (kg) 82.4 kg 82.4 kg 87.317 kg      Telemetry    Sinus rhythm with PVCs - Personally Reviewed  ECG    No new tracings - Personally Reviewed  Physical Exam   GEN: No acute distress.   Neck: no JVD Cardiac: RRR, heart sounds difficult given lung sounds Respiratory: respirations unlabored  GI: Soft, nontender, non-distended  MS: No edema; No deformity.  Labs    High Sensitivity  Troponin:  No results for input(s): TROPONINIHS in the last 720 hours.   Chemistry Recent Labs  Lab 01/15/22 1908 01/15/22 1914 01/18/22 0100 01/19/22 0257 01/20/22 0420  NA 141   < > 137 140 140  K 4.1   < > 3.2* 4.1 4.2  CL 112*   < > 109 113* 112*  CO2 16*   < > 19* 20* 18*  GLUCOSE 102*   < > 114* 116* 107*  BUN 28*   < > 14 25* 24*  CREATININE 0.98   < > 0.87 1.08 0.98  CALCIUM 9.1   < > 8.7* 9.0 9.0  PROT 6.7  --   --   --   --   ALBUMIN 3.9  --   --   --   --   AST 28  --   --   --   --   ALT 15  --   --   --   --   ALKPHOS 56  --   --   --   --   BILITOT 1.7*  --   --   --   --   GFRNONAA >60   < > >  60 >60 >60  ANIONGAP 13   < > 9 7 10    < > = values in this interval not displayed.    Lipids  Recent Labs  Lab 01/16/22 0449  CHOL 178  TRIG 43  HDL 40*  LDLCALC 129*  CHOLHDL 4.5    Hematology Recent Labs  Lab 01/18/22 0100 01/19/22 0257 01/20/22 0420  WBC 10.7* 10.6* 11.9*  RBC 5.15 5.29 5.71  HGB 16.1 16.5 17.8*  HCT 45.4 46.4 50.6  MCV 88.2 87.7 88.6  MCH 31.3 31.2 31.2  MCHC 35.5 35.6 35.2  RDW 12.9 13.2 13.4  PLT 297 336 374   Thyroid No results for input(s): TSH, FREET4 in the last 168 hours.  BNPNo results for input(s): BNP, PROBNP in the last 168 hours.  DDimer No results for input(s): DDIMER in the last 168 hours.   Radiology    No results found.  Cardiac Studies   ECHO 01/16/22:  1. Left ventricular ejection fraction, by estimation, is 35 to 40%. The  left ventricle has moderately decreased function. The left ventricle  demonstrates global hypokinesis. There is severe concentric left  ventricular hypertrophy. Left ventricular  diastolic function could not be evaluated.   2. Right ventricular systolic function is normal. The right ventricular  size is normal.   3. A small pericardial effusion is present. The pericardial effusion is  anterior to the right ventricle. There is no evidence of cardiac  tamponade.   4. The mitral valve  is normal in structure. No evidence of mitral valve  regurgitation. No evidence of mitral stenosis.   5. The aortic valve is normal in structure. Aortic valve regurgitation is  not visualized. No aortic stenosis is present.   6. 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   7. The inferior vena cava is normal in size with greater than 50%  respiratory variability, suggesting right atrial pressure of 3 mmHg.   Patient Profile     71 y.o. male without a cardiac history who is being seen today for the evaluation of cardiomyopathy in the setting of acute CVA.  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 - on echo this admission - will need to repeat - as above   Hyperlipidemia with LDL goal < 70 01/16/2022: Cholesterol 178; HDL 40; LDL Cholesterol 129; Triglycerides 43; VLDL 9 Started on crestor 20 mg this admission Recheck lipids in 6 weeks   Acute CVA - 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  Agree with above documentation.  On exam, patient is oriented to person only, regular rate and rhythm, no murmurs, lungs CTAB, no LE edema or JVD.  Started on entresto and coreg, no room to titrate GDMT at this time.  Will plan monitor on discharge.  Little Ishikawa, MD

## 2022-01-20 NOTE — Progress Notes (Signed)
PT Cancellation Note  Patient Details Name: Casey Rangel MRN: [id number] DOB: 04-10-1951   Cancelled Treatment:    Reason Eval/Treat Not Completed: Patient declined, no reason specified. Upon entering room, pt in bed scowling. After therapist introduced herself, pt notably agitated, raising arms and yelling 'shut up!' PT to re-attempt as time allows.   Ilda Foil 01/20/2022, 11:06 AM  Aida Raider, PT  Office # [phone] Pager [phone]

## 2022-01-20 NOTE — Progress Notes (Addendum)
Inpatient Rehab Admissions Coordinator:   Met with patient at bedside.  He does demo focused attention, only answers 50% of yes/no questions and no complex questions.  Note limited by agitation this morning.  I left another voicemail for his sister to discuss discharge planning.    1230: Met with patient and his sister, Casey Rangel, at the bedside.  Pt more restless than when I saw him this AM.  Breathing bouncing from low normal (14) to tachypnea (38) and RN notified.  Pt continue to only respond "no" intermittently.  I discussed rehab recommendations and caregiver support with pt's sister.  Described supervision level goals, but likely mod/max cuing for all tasks given cognitive and language deficits.  She feels that she will not be able to provide this level of care for him at home.  We discussed option of SNF and she is open to hearing more about that. She has questions about medicaid.  I let Casey Alberts, LCSW, know to f/u.    Shann Medal, PT, DPT Admissions Coordinator [phone] 01/20/22  11:34 AM

## 2022-01-20 NOTE — Progress Notes (Signed)
Initial Nutrition Assessment  DOCUMENTATION CODES:  Non-severe (moderate) malnutrition in context of chronic illness  INTERVENTION:  Continue current diet per SLP Nursing to assist with feeding as needed Ensure Enlive po BID, each supplement provides 350 kcal and 20 grams of protein. MVI with minerals daily  NUTRITION DIAGNOSIS:  Moderate Malnutrition related to chronic illness (CHF) as evidenced by moderate fat depletion, moderate muscle depletion.  GOAL:  Patient will meet greater than or equal to 90% of their needs  MONITOR:  PO intake, Supplement acceptance, Labs  REASON FOR ASSESSMENT:  Malnutrition Screening Tool    ASSESSMENT:  Pt with hx of GERD, cardiomyopathy, HLD, presented to ED as a code stroke after being found down at home. Last known normal was 3 days PTA. Found ot have had an acute left MCA.  5/31 - Mechanical THROMBECTOMY 6/1 - MBS, Diet Recommendations: Dysphagia 2; Thin liquid 6/4 - SLP upgraded diet to DYS 3  Pt resting in bed at the time of assessment. Awake, but doesn't respond to most nutrition questions. Unable to give a hx. Limited weight hx available in chart. Muscle and fat deficits present on exam. Per chart review, pt lives at home with his sister at baseline and will likely need SNF at discharge.   Discussed with RN, endorses that pt is not answering questions but will say a few words. Tray noted to be mostly consumed from this AM. Fair intake with average ~60% this admission. Will add nutrition supplements to augment intake and discourage weight loss while admitted.    Average Meal Intake: 6/1-6/4: 62% intake x 6 recorded meals  Nutritionally Relevant Medications: Scheduled Meds:  insulin aspart  0-9 Units Subcutaneous TID WC   pantoprazole  40 mg Oral Daily   potassium chloride  40 mEq Oral BID   rosuvastatin  20 mg Oral Daily   PRN Meds: senna-docusate  Labs Reviewed: BUN 24 CBG ranges from 107-118 mg/dL over the last 24  hours  NUTRITION - FOCUSED PHYSICAL EXAM: Flowsheet Row Most Recent Value  Orbital Region Mild depletion  Upper Arm Region Moderate depletion  Thoracic and Lumbar Region Moderate depletion  Buccal Region Mild depletion  Temple Region Mild depletion  Clavicle Bone Region Moderate depletion  Clavicle and Acromion Bone Region Moderate depletion  Scapular Bone Region Moderate depletion  Dorsal Hand Severe depletion  Patellar Region Severe depletion  Anterior Thigh Region Severe depletion  Posterior Calf Region Severe depletion  Edema (RD Assessment) None  Hair Reviewed  Eyes Reviewed  Mouth Reviewed  Skin Reviewed  Nails Reviewed   Diet Order:   Diet Order             DIET DYS 3 Room service appropriate? No; Fluid consistency: Thin  Diet effective now                   EDUCATION NEEDS:  Not appropriate for education at this time  Skin:  Skin Assessment: Reviewed RN Assessment  Last BM:  6/4 - type 4  Height:  Ht Readings from Last 1 Encounters:  01/15/22 5\' 10"  (1.778 m)    Weight:  Wt Readings from Last 1 Encounters:  01/15/22 82.4 kg    Ideal Body Weight:  75.5 kg  BMI:  Body mass index is 26.07 kg/m.  Estimated Nutritional Needs:  Kcal:  2000-2200 kcal/d Protein:  100-115 g/d Fluid:  >/=2L/d   Ranell Patrick, RD, LDN Clinical Dietitian RD pager # available in AMION  After hours/weekend pager # available  in [facility]

## 2022-01-20 NOTE — Progress Notes (Signed)
STROKE TEAM PROGRESS NOTE   INTERVAL HISTORY No family at the bedside.  He remains interactive today. Speaking some single/combinations of words  CM to discuss SNF vs CIR with family. He is seen sitting up in the chair, able to feed himself.  Vital signs stable.  Neurological exam unchanged.    Vitals:   01/20/22 0806 01/20/22 1151 01/20/22 1158 01/20/22 1205  BP: (!) 146/88 (!) 114/100 (!) 114/100 (!) 114/100  Pulse: 91 92 92 92  Resp: (!) 30 (!) 38 (!) 38 (!) 38  Temp: 98.2 F (36.8 C)  (!) 97.5 F (36.4 C) (!) 97.5 F (36.4 C)  TempSrc: Oral  Oral Axillary  SpO2: 98% 100% 100% 100%  Weight:      Height:       CBC:  Recent Labs  Lab 01/15/22 1908 01/15/22 1914 01/19/22 0257 01/20/22 0420  WBC 16.3*   < > 10.6* 11.9*  NEUTROABS 13.9*  --   --   --   HGB 16.4   < > 16.5 17.8*  HCT 49.3   < > 46.4 50.6  MCV 94.6   < > 87.7 88.6  PLT 277   < > 336 374   < > = values in this interval not displayed.   Basic Metabolic Panel:  Recent Labs  Lab 01/19/22 0257 01/20/22 0420  NA 140 140  K 4.1 4.2  CL 113* 112*  CO2 20* 18*  GLUCOSE 116* 107*  BUN 25* 24*  CREATININE 1.08 0.98  CALCIUM 9.0 9.0   Lipid Panel:  Recent Labs  Lab 01/16/22 0449  CHOL 178  TRIG 43  HDL 40*  CHOLHDL 4.5  VLDL 9  LDLCALC 129*   HgbA1c:  Recent Labs  Lab 01/16/22 0449  HGBA1C 5.1   Urine Drug Screen:  Recent Labs  Lab 01/15/22 1908  LABOPIA NONE DETECTED  COCAINSCRNUR NONE DETECTED  LABBENZ NONE DETECTED  AMPHETMU NONE DETECTED  THCU NONE DETECTED  LABBARB NONE DETECTED    Alcohol Level  Recent Labs  Lab 01/15/22 1930  ETH <10    IMAGING past 24 hours No results found.  PHYSICAL EXAM  Physical Exam  Constitutional: Appears well-developed and well-nourished elderly male not in distress.  Cardiovascular: Normal rate and regular rhythm.  Respiratory: Effort normal, breathing unlabored on room air  Neuro: Mental Status: Patient is awake, alert globally  aphasic Speaks occasional words but unable to speak sentences.  Follows simple midline and one-step commands.  States name, location, and able to identify "banana peel" cranial Nerves: II: Visual Fields are full. Pupils are equal, round, and reactive to light.   III,IV, VI: EOMI without ptosis or diploplia.  V: Facial sensation is symmetric to temperature VII: Facial movement is symmetric resting and smiling VIII: Hearing is intact to voice X: Palate elevates symmetrically XI: Shoulder shrug strong on the left XII:  Motor: Tone is normal. Bulk is normal.  RUE 0/5 LUE 5/5 RLE 3/5 LLE 5/5 Sensory: Sensation is symmetric to light touch and temperature in the arms and legs. No extinction to DSS present.  Withdrawal to pain in all extremities Cerebellar: Unable to participate, no ataxia noted in left upper or lower extremity   ASSESSMENT/PLAN Casey Rangel is a 71 y.o. male with history of GERD, left hip fracture presenting with global aphasia and right sided weakness and 3 days of increased confusion. Taken for a mechanical thrombectomy and angioplasty. Start DAPT therapy with ASA 81mg  and plavix 75mg  after MRI. Passed  MBS. Will recommend cardiology consult for cardiomyopathies, check LE venous Doppler, and recommend 30-day cardiac event monitor as outpatient. Cardiology consult placed, appreciate recommendations.  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 Cardioembolic source  Code Stroke CT head No acute abnormality. old infarct left parieto-occipital, right PCA, right MCA/ACA  CTA head & neck severe near occlusive stenosis at the Lt ICA terminus with radiographic string sign.  CT perfusion 190 mL perfusion deficit within the left MCA distribution  MRI  patchy watershed distrubution stroke in left MCA territory S/p IR with mechanical thrombectomy and angioplasty of left ICA terminus MRA  The  previously demonstrated high-grade stenosis of the supraclinoid left ICA is no longer appreciated. 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 2D Echo EF 35 to 40% LE venous Doppler no DVT We will consider 30-day CardioNet monitoring as outpatient to rule out A-fib LDL 129 HgbA1c 5.1 VTE prophylaxis -Lovenox No antithrombotic prior to admission, ASA 81mg  and Plavix 75mg  DAPT for 3 weeks and then ASA alone.  Therapy recommendations: CIR Disposition:  pending  Cardiomyopathy 2D echo EF 35 to 40% No prior records to compare Cardiology consulted 6/3  Hypertension BP goal < 180/105 Stable now Long-term BP goal normotensive Off Cleviprex Start norvasc 5mg   Hyperlipidemia Home meds:  None LDL 129, goal < 70 Add crestor 20mg  Continue statin at discharge  Sundowning  Agitation 6/2 evening Needed haldol  IV PRN Put on seroquel 25mg  Qhs  Other Stroke Risk Factors Advanced Age >/= 46   Other Active Problems Dysphagia - now pass swallow, on dys3 and thin SLP continuing to follow to advance diet Acute hypoxic respiratory insufficiency- resolved   Hospital day # 5   Patient continues to show slow improvement.  Continue ongoing therapies.  Likely transfer to rehab versus SNF later this week when bed available.  Discussed with rehab coordinator. Antony Contras, MD Medical Director [place] Pager: [phone] 01/20/2022 1:08 PM    To contact Stroke Continuity provider, please refer to [email or web address] After hours, contact General Neurology

## 2022-01-20 NOTE — TOC Initial Note (Signed)
Transition of Care [facility]) - Initial/Assessment Note    Patient Details  Name: Casey Rangel MRN: [id number] Date of Birth: 1951/07/09  [facility]) CM/SW Contact:    Baldemar Lenis, LCSW Phone Number: 01/20/2022, 4:27 PM  Clinical Narrative:           CSW alerted by rehab admissions of need for SNF placement, sister in agreement. CSW faxed out referral, attempted to meet with sister at bedside but she was gone. CSW left a voicemail requesting a call back. CSW to follow.        Expected Discharge Plan: Skilled Nursing Facility Barriers to Discharge: Continued Medical Work up   Patient Goals and CMS Choice Patient states their goals for this hospitalization and ongoing recovery are:: patient unable to participate in goal setting CMS Medicare.gov Compare Post Acute Care list provided to:: Patient Represenative (must comment) Choice offered to / list presented to : Sibling  Expected Discharge Plan and Services Expected Discharge Plan: Skilled Nursing Facility     Post Acute Care Choice: Skilled Nursing Facility Living arrangements for the past 2 months: Single Family Home                                      Prior Living Arrangements/Services Living arrangements for the past 2 months: Single Family Home Lives with:: Siblings Patient language and need for interpreter reviewed:: No Do you feel safe going back to the place where you live?: Yes      Need for Family Participation in Patient Care: Yes (Comment) Care giver support system in place?: No (comment)   Criminal Activity/Legal Involvement Pertinent to Current Situation/Hospitalization: No - Comment as needed  Activities of Daily Living Home Assistive Devices/Equipment: None ADL Screening (condition at time of admission) Patient's cognitive ability adequate to safely complete daily activities?: No Is the patient deaf or have difficulty hearing?: No Does the patient have difficulty seeing, even when  wearing glasses/contacts?: No Does the patient have difficulty concentrating, remembering, or making decisions?: Yes Patient able to express need for assistance with ADLs?: No Does the patient have difficulty dressing or bathing?: Yes Independently performs ADLs?: No Does the patient have difficulty walking or climbing stairs?: Yes Weakness of Legs: Both Weakness of Arms/Hands: Both  Permission Sought/Granted Permission sought to share information with : Facility Medical sales representative, Family Supports Permission granted to share information with : Yes, Verbal Permission Granted  Share Information with NAME: [name]  Permission granted to share info w AGENCY: SNF  Permission granted to share info w Relationship: Sister     Emotional Assessment   Attitude/Demeanor/Rapport: Unable to Assess Affect (typically observed): Unable to Assess Orientation: : Oriented to Self Alcohol / Substance Use: Not Applicable Psych Involvement: No (comment)  Admission diagnosis:  Acute ischemic left MCA stroke (HCC) [I63.512] Acute ischemic stroke [facility]) [I63.9] Patient Active Problem List   Diagnosis Date Noted   Malnutrition of moderate degree 01/20/2022   Acute combined systolic and diastolic heart failure (HCC)    Acute ischemic left MCA stroke (HCC) 01/15/2022   Primary hypertension 11/14/2020   Closed left hip fracture (HCC) 10/17/2020   Left hip pain 10/17/2020   GERD (gastroesophageal reflux disease)    Prolonged QT interval    Elevated troponin    Anemia    Fall    PCP:  Pcp, No Pharmacy:  No Pharmacies Listed    Social Determinants of Health (  SDOH) Interventions    Readmission Risk Interventions     View : No data to display.

## 2022-01-20 NOTE — Progress Notes (Addendum)
OT Cancellation Note  Patient Details Name: Casey Rangel MRN: [id number] DOB: 26-Sep-1950   Cancelled Treatment:    Reason Eval/Treat Not Completed: Other (comment) (pt agitation limiting ability to work with therapy). Plan to reattempt at a later time/date.  Raynald Kemp, OT Acute Rehabilitation Services Office: [phone]  01/20/2022, 11:19 AM

## 2022-01-21 LAB — BASIC METABOLIC PANEL
Anion gap: 6 (ref 5–15)
BUN: 37 mg/dL — ABNORMAL HIGH (ref 8–23)
CO2: 15 mmol/L — ABNORMAL LOW (ref 22–32)
Calcium: 9 mg/dL (ref 8.9–10.3)
Chloride: 119 mmol/L — ABNORMAL HIGH (ref 98–111)
Creatinine, Ser: 1.07 mg/dL (ref 0.61–1.24)
GFR, Estimated: >60 mL/min (ref >60)
Glucose, Bld: 118 mg/dL — ABNORMAL HIGH (ref 70–99)
Potassium: 4.4 mmol/L (ref 3.5–5.1)
Sodium: 140 mmol/L (ref 135–145)

## 2022-01-21 LAB — CBC
HCT: 50 % (ref 39.0–52.0)
Hemoglobin: 17.1 g/dL — ABNORMAL HIGH (ref 13.0–17.0)
MCH: 30.6 pg (ref 26.0–34.0)
MCHC: 34.2 g/dL (ref 30.0–36.0)
MCV: 89.6 fL (ref 80.0–100.0)
Platelets: 356 10*3/uL (ref 150–400)
RBC: 5.58 MIL/uL (ref 4.22–5.81)
RDW: 13.7 % (ref 11.5–15.5)
WBC: 11 10*3/uL — ABNORMAL HIGH (ref 4.0–10.5)
nRBC: 0 % (ref 0.0–0.2)

## 2022-01-21 LAB — GLUCOSE, CAPILLARY
Glucose-Capillary: 117 mg/dL — ABNORMAL HIGH (ref 70–99)
Glucose-Capillary: 121 mg/dL — ABNORMAL HIGH (ref 70–99)

## 2022-01-21 MED ORDER — ASPIRIN 81 MG PO TBEC: 81.0000 mg | DELAYED_RELEASE_TABLET | Freq: Every day | ORAL | 12 refills | Status: DC

## 2022-01-21 MED ORDER — QUETIAPINE FUMARATE 25 MG PO TABS: 25.0000 mg | ORAL_TABLET | Freq: Every day | ORAL | 1 refills | Status: DC

## 2022-01-21 MED ORDER — SACUBITRIL-VALSARTAN 24-26 MG PO TABS: 1.0000 | ORAL_TABLET | Freq: Two times a day (BID) | ORAL | 1 refills | Status: DC

## 2022-01-21 MED ORDER — CLOPIDOGREL BISULFATE 75 MG PO TABS
75.0000 mg | ORAL_TABLET | Freq: Every day | ORAL | 0 refills | Status: DC
Start: 2022-01-22 — End: 2022-03-11

## 2022-01-21 MED ORDER — ROSUVASTATIN CALCIUM 20 MG PO TABS: 20.0000 mg | ORAL_TABLET | Freq: Every day | ORAL | 1 refills | Status: DC

## 2022-01-21 MED ORDER — SENNOSIDES-DOCUSATE SODIUM 8.6-50 MG PO TABS: 1.0000 | ORAL_TABLET | Freq: Every evening | ORAL | 1 refills | Status: DC | PRN

## 2022-01-21 MED ORDER — CARVEDILOL 3.125 MG PO TABS
3.1250 mg | ORAL_TABLET | Freq: Two times a day (BID) | ORAL | 1 refills | Status: DC
Start: 2022-01-21 — End: 2023-09-09

## 2022-01-21 NOTE — Plan of Care (Signed)
Pt admitted with diagnosis of Acute Ischemic Left MCA/Stroke.  Has experienced severe Aphasia and Dysarthria, has participated in care plan, therapy sessions and teaching sessions. All personal belongings returned and packed in bag at bedside. Heart monitoring stopped, IV to back of right hand discontinued with tip intact, no bleeding or s/s of infection, guaze dressing in place. Discharged to SNF via [facility] transport. Family made aware.

## 2022-01-21 NOTE — Discharge Summary (Addendum)
and distal right MCA branches are widely patent. Posterior circulation: Atheromatous irregularity within the dominant left V4 segment without significant stenosis. Left PICA patent. Right vertebral artery largely terminates in PICA. Right PICA patent as well. Hypoplastic right V4 segment largely occludes beyond the takeoff of the right PICA. Basilar irregular and somewhat diminutive but patent to its distal aspect. Superior cerebral arteries patent bilaterally. Predominant fetal type origin of the PCAs. Both PCAs irregular but patent to their distal aspects without high-grade stenosis. Venous sinuses: Grossly patent allowing for timing the contrast bolus. Anatomic variants: As above.  No aneurysm. Review of the MIP images confirms the above findings CT Brain Perfusion Findings: ASPECTS: 10. CBF (<30%) Volume: 55m Perfusion (Tmax>6.0s) volume: 1913mMismatch Volume: 1908mnfarction Location:No evidence for acute core  infarct by CT perfusion. Large 190 cc perfusion deficit within the left MCA distribution related to the above described severe stenosis/near occlusion at the left ICA terminus. IMPRESSION: 1. 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 While this finding is somewhat age indeterminate, subtle surrounding soft tissue attenuation suggests that there is likely an acute component. No visible downstream left MCA embolic occlusion. 2. 190 mL perfusion deficit within the left MCA distribution related to the above described severe stenosis/near occlusion at the left ICA terminus. No acute core infarct by CT perfusion. 3. Moderate atheromatous disease elsewhere about the major arterial vasculature of the head and neck. No other proximal high-grade or correctable stenosis. 4. No acute traumatic injury within the cervical spine. 5. Aortic Atherosclerosis (ICD10-I70.0) and Emphysema (ICD10-J43.9). Critical Value/emergent results were called by telephone at the time of interpretation on 01/15/2022 at 7:40 p.m. to provider Dr. BHACurly Shoresho verbally acknowledged these results. Electronically Signed   By: BenJeannine BogaD.   On: 01/15/2022 20:36   DG CHEST PORT 1 VIEW  Result Date: 01/16/2022 CLINICAL DATA:  Hypoxia, altered mental status. EXAM: PORTABLE CHEST 1 VIEW COMPARISON:  Chest x-ray 10/17/2020 FINDINGS: Aorta is ectatic in the heart is mildly enlarged, unchanged. The lungs are clear. There is no pleural effusion or pneumothorax. No acute fractures are seen. IMPRESSION: 1. No acute cardiopulmonary process. No significant interval change. Electronically Signed   By: AmyRonney AstersD.   On: 01/16/2022 00:45   DG Swallowing Func-Speech Pathology  Result Date: 01/16/2022 Table formatting from the original result was not included. Objective Swallowing Evaluation: Type of Study: Bedside Swallow Evaluation  Patient Details Name: Casey CayerN: [id number] of  Birth: 9/208/12/1952day's Date: 01/16/2022 Time: SLP Start Time (ACUTE ONLY): 1254 -SLP Stop Time (ACUTE ONLY): 1311 SLP Time Calculation (min) (ACUTE ONLY): 17 min Past Medical History: Past Medical History: Diagnosis Date  GERD (gastroesophageal reflux disease)   Hiatal hernia  Past Surgical History: Past Surgical History: Procedure Laterality Date  INTRAMEDULLARY (IM) NAIL INTERTROCHANTERIC Left 10/17/2020  Procedure: INTRAMEDULLARY (IM) NAIL INTERTROCHANTRIC;  Surgeon: MenHessie KnowsD;  Location: [facility] ORS;  Service: Orthopedics;  Laterality: Left;  IR CT HEAD LTD  01/15/2022  IR PERCUTANEOUS ART THROMBECTOMY/INFUSION INTRACRANIAL INC DIAG ANGIO  01/15/2022  IR PTA INTRACRANIAL  01/15/2022  IR US [place] VASC ACCESS RIGHT  01/15/2022  RADIOLOGY WITH ANESTHESIA N/A 01/15/2022  Procedure: RADIOLOGY WITH ANESTHESIA;  Surgeon: DevLuanne BrasD;  [facility];  Laterality: N/A;  T9-T11 fusion in 1990   HPI: Pt is a [age]o. male who presented with aphasia and right-sided weakness. MRI brain 5/31: Patchy acute ischemic nonhemorrhagic left  tube. Multiple delay angiograms of the left ICA with frontal and lateral views showed stable patency of the left ICA terminus with brisk contrast opacification of the left anterior circulation. Flat panel CT of the head was obtained and post processed in a separate workstation with concurrent attending physician supervision. Selected images were sent to PACS. No evidence of hemorrhagic  complication. Right common femoral artery angiogram was obtained in right anterior oblique and lateral views. The puncture is at the level of the common femoral artery. The artery has mild atherosclerotic changes without significant stenosis, adequate for closure device. The sheath was exchanged over the wire for an 8 [place] Angio-Seal which was utilized for access closure. Immediate hemostasis was achieved. IMPRESSION: Successful mechanical thrombectomy followed by angioplasty for treatment of near occlusive the stenosis and thrombus in the left ICA terminus with complete recanalization and brisk left anterior circulation anterograde flow. No evidence of thromboembolic or hemorrhagic complication. PLAN: Transfer to ICU for continued care. Continue ASA 325 mg q.d. Electronically Signed   By: Pedro Earls M.D.   On: 01/16/2022 09:43   CT HEAD CODE STROKE WO CONTRAST  Result Date: 01/15/2022 CLINICAL DATA:  Code stroke. EXAM: CT HEAD WITHOUT CONTRAST TECHNIQUE: Contiguous axial images were obtained from the base of the skull through the vertex without intravenous contrast. RADIATION DOSE REDUCTION: This exam was performed according to the departmental dose-optimization program which includes automated exposure control, adjustment of the mA and/or kV according to patient size and/or use of iterative reconstruction technique. COMPARISON:  None available. FINDINGS: Brain: Generalized age-related cerebral atrophy with moderately advanced chronic microvascular ischemic disease. Several remote lacunar infarcts present about the bilateral basal ganglia and thalami. Multifocal areas of encephalomalacia involving the right parietal and occipital lobes as well as the left temporal occipital region, consistent with chronic ischemic infarcts. Small remote right cerebellar infarct. No acute intracranial hemorrhage. Note made of a 7 mm partially calcified lesion at the fourth ventricular outflow tract (series  2, image 7), indeterminate, but favored to be vascular in nature, possibly reflecting aneurysm. No other mass lesion, mass effect or midline shift. No hydrocephalus or extra-axial fluid collection. Vascular: No hyperdense vessel. 7 mm fourth ventricular lesion as above. Calcified atherosclerosis present at skull base. Skull: Scalp soft tissues and calvarium within normal limits. Sinuses/Orbits: Left gaze noted. Scattered mucosal thickening noted about the ethmoidal air cells and maxillary sinuses. Mastoid air cells are clear. Other: None. ASPECTS [facility] Stroke Program Early CT Score) - Ganglionic level infarction (caudate, lentiform nuclei, internal capsule, insula, M1-M3 cortex): 7 - Supraganglionic infarction (M4-M6 cortex): 3 Total score (0-10 with 10 being normal): 10 IMPRESSION: 1. No acute intracranial abnormality. 2. ASPECTS is 10. 3. Atrophy with moderate chronic small vessel ischemic disease with multiple remote ischemic infarcts as above. 4. Approximate 7 mm lesion at the level of the fourth ventricular outflow tract, indeterminate, but favored to be vascular in nature, potentially reflecting aneurysm. These results were communicated to Dr. Curly Shores at 7:19 pm on 01/15/2022 by text page via the [facility] messaging system. Electronically Signed   By: Jeannine Boga M.D.   On: 01/15/2022 19:23   VAS [place] LOWER EXTREMITY VENOUS (DVT)  Result Date: 01/17/2022  Lower Venous DVT Study Patient Name:  Casey Rangel  Date of Exam:   01/17/2022 Medical Rec #: [id number]       Accession #:    [id number] Date of Birth: 1951/05/27       Patient Gender: M Patient Age:  Stroke Discharge Summary  Patient ID: Casey Rangel   MRN: [id number]      DOB: [birth year]  Date of Admission: 01/15/2022 Date of Discharge: 01/21/2022  Attending Physician:  Stroke, Md, MD, Stroke MD Consultant(s):    cardiology and pulmonary/intensive care  Patient's PCP:  Pcp, No  DISCHARGE DIAGNOSIS: Stroke:  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 Cardioembolic source Principal Problem:   Acute ischemic left MCA stroke [facility]) Active Problems:   Malnutrition of moderate degree   Acute combined systolic and diastolic heart failure ([place])   Allergies as of 01/21/2022       Reactions   Penicillins Other (See Comments)   Unknown         Medication List     STOP taking these medications    docusate sodium 100 MG capsule Commonly known as: COLACE   enoxaparin 40 MG/0.4ML injection Commonly known as: LOVENOX   HYDROcodone-acetaminophen 5-325 MG tablet Commonly known as: NORCO/VICODIN   traZODone 50 MG tablet Commonly known as: DESYREL   Vitamin D (Ergocalciferol) 1.25 MG (50000 UNIT) Caps capsule Commonly known as: DRISDOL       TAKE these medications    aspirin EC 81 MG tablet Take 1 tablet (81 mg total) by mouth daily. Swallow whole. Start taking on: January 22, 2022   carvedilol 3.125 MG tablet Commonly known as: COREG Take 1 tablet (3.125 mg total) by mouth 2 (two) times daily with a meal.   clopidogrel 75 MG tablet Commonly known as: PLAVIX Take 1 tablet (75 mg total) by mouth daily. Start taking on: January 22, 2022   QUEtiapine 25 MG tablet Commonly known as: SEROQUEL Take 1 tablet (25 mg total) by mouth at bedtime.   rosuvastatin 20 MG tablet Commonly known as: CRESTOR Take 1 tablet (20 mg total) by mouth daily. Start taking on: January 22, 2022   sacubitril-valsartan 24-26 MG Commonly known as: ENTRESTO Take 1 tablet by mouth 2 (two) times daily.   senna-docusate  8.6-50 MG tablet Commonly known as: Senokot-S Take 1 tablet by mouth at bedtime as needed for mild constipation or moderate constipation. What changed:  when to take this reasons to take this        LABORATORY STUDIES CBC    Component Value Date/Time   WBC 11.0 (H) 01/21/2022 0328   RBC 5.58 01/21/2022 0328   HGB 17.1 (H) 01/21/2022 0328   HCT 50.0 01/21/2022 0328   PLT 356 01/21/2022 0328   MCV 89.6 01/21/2022 0328   MCH 30.6 01/21/2022 0328   MCHC 34.2 01/21/2022 0328   RDW 13.7 01/21/2022 0328   LYMPHSABS 1.0 01/15/2022 1908   MONOABS 1.1 (H) 01/15/2022 1908   EOSABS 0.0 01/15/2022 1908   BASOSABS 0.1 01/15/2022 1908   CMP    Component Value Date/Time   NA 140 01/21/2022 0328   K 4.4 01/21/2022 0328   CL 119 (H) 01/21/2022 0328   CO2 15 (L) 01/21/2022 0328   GLUCOSE 118 (H) 01/21/2022 0328   BUN 37 (H) 01/21/2022 0328   CREATININE 1.07 01/21/2022 0328   CALCIUM 9.0 01/21/2022 0328   PROT 6.7 01/15/2022 1908   ALBUMIN 3.9 01/15/2022 1908   AST 28 01/15/2022 1908   ALT 15 01/15/2022 1908   ALKPHOS 56 01/15/2022 1908   BILITOT 1.7 (H) 01/15/2022 1908   GFRNONAA >60 01/21/2022 0328   COAGS Lab Results  Component Value  and distal right MCA branches are widely patent. Posterior circulation: Atheromatous irregularity within the dominant left V4 segment without significant stenosis. Left PICA patent. Right vertebral artery largely terminates in PICA. Right PICA patent as well. Hypoplastic right V4 segment largely occludes beyond the takeoff of the right PICA. Basilar irregular and somewhat diminutive but patent to its distal aspect. Superior cerebral arteries patent bilaterally. Predominant fetal type origin of the PCAs. Both PCAs irregular but patent to their distal aspects without high-grade stenosis. Venous sinuses: Grossly patent allowing for timing the contrast bolus. Anatomic variants: As above.  No aneurysm. Review of the MIP images confirms the above findings CT Brain Perfusion Findings: ASPECTS: 10. CBF (<30%) Volume: 55m Perfusion (Tmax>6.0s) volume: 1913mMismatch Volume: 1908mnfarction Location:No evidence for acute core  infarct by CT perfusion. Large 190 cc perfusion deficit within the left MCA distribution related to the above described severe stenosis/near occlusion at the left ICA terminus. IMPRESSION: 1. 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 While this finding is somewhat age indeterminate, subtle surrounding soft tissue attenuation suggests that there is likely an acute component. No visible downstream left MCA embolic occlusion. 2. 190 mL perfusion deficit within the left MCA distribution related to the above described severe stenosis/near occlusion at the left ICA terminus. No acute core infarct by CT perfusion. 3. Moderate atheromatous disease elsewhere about the major arterial vasculature of the head and neck. No other proximal high-grade or correctable stenosis. 4. No acute traumatic injury within the cervical spine. 5. Aortic Atherosclerosis (ICD10-I70.0) and Emphysema (ICD10-J43.9). Critical Value/emergent results were called by telephone at the time of interpretation on 01/15/2022 at 7:40 p.m. to provider Dr. BHACurly Shoresho verbally acknowledged these results. Electronically Signed   By: BenJeannine BogaD.   On: 01/15/2022 20:36   DG CHEST PORT 1 VIEW  Result Date: 01/16/2022 CLINICAL DATA:  Hypoxia, altered mental status. EXAM: PORTABLE CHEST 1 VIEW COMPARISON:  Chest x-ray 10/17/2020 FINDINGS: Aorta is ectatic in the heart is mildly enlarged, unchanged. The lungs are clear. There is no pleural effusion or pneumothorax. No acute fractures are seen. IMPRESSION: 1. No acute cardiopulmonary process. No significant interval change. Electronically Signed   By: AmyRonney AstersD.   On: 01/16/2022 00:45   DG Swallowing Func-Speech Pathology  Result Date: 01/16/2022 Table formatting from the original result was not included. Objective Swallowing Evaluation: Type of Study: Bedside Swallow Evaluation  Patient Details Name: Casey CayerN: [id number] of  Birth: 9/208/12/1952day's Date: 01/16/2022 Time: SLP Start Time (ACUTE ONLY): 1254 -SLP Stop Time (ACUTE ONLY): 1311 SLP Time Calculation (min) (ACUTE ONLY): 17 min Past Medical History: Past Medical History: Diagnosis Date  GERD (gastroesophageal reflux disease)   Hiatal hernia  Past Surgical History: Past Surgical History: Procedure Laterality Date  INTRAMEDULLARY (IM) NAIL INTERTROCHANTERIC Left 10/17/2020  Procedure: INTRAMEDULLARY (IM) NAIL INTERTROCHANTRIC;  Surgeon: MenHessie KnowsD;  Location: [facility] ORS;  Service: Orthopedics;  Laterality: Left;  IR CT HEAD LTD  01/15/2022  IR PERCUTANEOUS ART THROMBECTOMY/INFUSION INTRACRANIAL INC DIAG ANGIO  01/15/2022  IR PTA INTRACRANIAL  01/15/2022  IR US [place] VASC ACCESS RIGHT  01/15/2022  RADIOLOGY WITH ANESTHESIA N/A 01/15/2022  Procedure: RADIOLOGY WITH ANESTHESIA;  Surgeon: DevLuanne BrasD;  [facility];  Laterality: N/A;  T9-T11 fusion in 1990   HPI: Pt is a [age]o. male who presented with aphasia and right-sided weakness. MRI brain 5/31: Patchy acute ischemic nonhemorrhagic left  tube. Multiple delay angiograms of the left ICA with frontal and lateral views showed stable patency of the left ICA terminus with brisk contrast opacification of the left anterior circulation. Flat panel CT of the head was obtained and post processed in a separate workstation with concurrent attending physician supervision. Selected images were sent to PACS. No evidence of hemorrhagic  complication. Right common femoral artery angiogram was obtained in right anterior oblique and lateral views. The puncture is at the level of the common femoral artery. The artery has mild atherosclerotic changes without significant stenosis, adequate for closure device. The sheath was exchanged over the wire for an 8 [place] Angio-Seal which was utilized for access closure. Immediate hemostasis was achieved. IMPRESSION: Successful mechanical thrombectomy followed by angioplasty for treatment of near occlusive the stenosis and thrombus in the left ICA terminus with complete recanalization and brisk left anterior circulation anterograde flow. No evidence of thromboembolic or hemorrhagic complication. PLAN: Transfer to ICU for continued care. Continue ASA 325 mg q.d. Electronically Signed   By: Pedro Earls M.D.   On: 01/16/2022 09:43   CT HEAD CODE STROKE WO CONTRAST  Result Date: 01/15/2022 CLINICAL DATA:  Code stroke. EXAM: CT HEAD WITHOUT CONTRAST TECHNIQUE: Contiguous axial images were obtained from the base of the skull through the vertex without intravenous contrast. RADIATION DOSE REDUCTION: This exam was performed according to the departmental dose-optimization program which includes automated exposure control, adjustment of the mA and/or kV according to patient size and/or use of iterative reconstruction technique. COMPARISON:  None available. FINDINGS: Brain: Generalized age-related cerebral atrophy with moderately advanced chronic microvascular ischemic disease. Several remote lacunar infarcts present about the bilateral basal ganglia and thalami. Multifocal areas of encephalomalacia involving the right parietal and occipital lobes as well as the left temporal occipital region, consistent with chronic ischemic infarcts. Small remote right cerebellar infarct. No acute intracranial hemorrhage. Note made of a 7 mm partially calcified lesion at the fourth ventricular outflow tract (series  2, image 7), indeterminate, but favored to be vascular in nature, possibly reflecting aneurysm. No other mass lesion, mass effect or midline shift. No hydrocephalus or extra-axial fluid collection. Vascular: No hyperdense vessel. 7 mm fourth ventricular lesion as above. Calcified atherosclerosis present at skull base. Skull: Scalp soft tissues and calvarium within normal limits. Sinuses/Orbits: Left gaze noted. Scattered mucosal thickening noted about the ethmoidal air cells and maxillary sinuses. Mastoid air cells are clear. Other: None. ASPECTS [facility] Stroke Program Early CT Score) - Ganglionic level infarction (caudate, lentiform nuclei, internal capsule, insula, M1-M3 cortex): 7 - Supraganglionic infarction (M4-M6 cortex): 3 Total score (0-10 with 10 being normal): 10 IMPRESSION: 1. No acute intracranial abnormality. 2. ASPECTS is 10. 3. Atrophy with moderate chronic small vessel ischemic disease with multiple remote ischemic infarcts as above. 4. Approximate 7 mm lesion at the level of the fourth ventricular outflow tract, indeterminate, but favored to be vascular in nature, potentially reflecting aneurysm. These results were communicated to Dr. Curly Shores at 7:19 pm on 01/15/2022 by text page via the [facility] messaging system. Electronically Signed   By: Jeannine Boga M.D.   On: 01/15/2022 19:23   VAS [place] LOWER EXTREMITY VENOUS (DVT)  Result Date: 01/17/2022  Lower Venous DVT Study Patient Name:  Casey Rangel  Date of Exam:   01/17/2022 Medical Rec #: [id number]       Accession #:    [id number] Date of Birth: 1951/05/27       Patient Gender: M Patient Age:  ECHOCARDIOGRAM COMPLETE  Result Date: 01/16/2022    ECHOCARDIOGRAM REPORT   Patient Name:   Casey Rangel Date of Exam: 01/16/2022 Medical Rec #:  [id number]      Height:       70.0 in Accession #:    [id number]     Weight:       181.7 lb Date of Birth:  March 19, 1951      BSA:          2.004 m Patient Age:    35 years       BP:           132/84 mmHg Patient Gender: M              HR:           74 bpm. Exam Location:  Inpatient Procedure: 2D Echo, Cardiac Doppler and  Color Doppler Indications:    Stroke  History:        Patient has no prior history of Echocardiogram examinations.                 Risk Factors:Former Smoker. GERD.  Sonographer:    Clayton Lefort RDCS (AE) Referring Phys: [id number] Lorenza Chick  Sonographer Comments: No subcostal window. IMPRESSIONS  1. Left ventricular ejection fraction, by estimation, is 35 to 40%. The left ventricle has moderately decreased function. The left ventricle demonstrates global hypokinesis. There is severe concentric left ventricular hypertrophy. Left ventricular diastolic function could not be evaluated.  2. Right ventricular systolic function is normal. The right ventricular size is normal.  3. A small pericardial effusion is present. The pericardial effusion is anterior to the right ventricle. There is no evidence of cardiac tamponade.  4. The mitral valve is normal in structure. No evidence of mitral valve regurgitation. No evidence of mitral stenosis.  5. The aortic valve is normal in structure. Aortic valve regurgitation is not visualized. No aortic stenosis is present.  6. 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  7. The inferior vena cava is normal in size with greater than 50% respiratory variability, suggesting right atrial pressure of 3 mmHg. FINDINGS  Left Ventricle: Left ventricular ejection fraction, by estimation, is 35 to 40%. The left ventricle has moderately decreased function. The left ventricle demonstrates global hypokinesis. The left ventricular internal cavity size was normal in size. There is severe concentric left ventricular hypertrophy. Left ventricular diastolic function could not be evaluated. Right Ventricle: The right ventricular size is normal. No increase in right ventricular wall thickness. Right ventricular systolic function is normal. Left Atrium: Left atrial size was normal in size. Right Atrium: Right atrial size was normal in size. Pericardium:  A small pericardial effusion is present. The pericardial effusion is anterior to the right ventricle. There is no evidence of cardiac tamponade. Mitral Valve: The mitral valve is normal in structure. No evidence of mitral valve regurgitation. No evidence of mitral valve stenosis. MV peak gradient, 7.0 mmHg. The mean mitral valve gradient is 3.0 mmHg. Tricuspid Valve: The tricuspid valve is normal in structure. Tricuspid valve regurgitation is not demonstrated. No evidence of tricuspid stenosis. Aortic Valve: The aortic valve is normal in structure. Aortic valve regurgitation is not visualized. No aortic stenosis is present. Aortic valve mean gradient measures 3.0 mmHg. Aortic valve peak gradient measures 5.4 mmHg. Aortic valve area, by VTI measures 2.86 cm. Pulmonic Valve: The pulmonic valve was normal in structure. Pulmonic valve regurgitation is not visualized.  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 Cardioembolic source Principal Problem:   Acute ischemic left MCA stroke [facility]) Active Problems:   Malnutrition of moderate degree   Acute combined systolic and diastolic heart failure ([place])   Allergies as of 01/21/2022       Reactions   Penicillins Other (See Comments)   Unknown         Medication List     STOP taking these medications    docusate sodium 100 MG capsule Commonly known as: COLACE   enoxaparin 40 MG/0.4ML injection Commonly known as: LOVENOX   HYDROcodone-acetaminophen 5-325 MG tablet Commonly known as: NORCO/VICODIN   traZODone 50 MG tablet Commonly known as: DESYREL   Vitamin D (Ergocalciferol) 1.25 MG (50000 UNIT) Caps capsule Commonly known as: DRISDOL       TAKE these medications    aspirin EC 81 MG tablet Take 1 tablet (81 mg total) by mouth daily. Swallow whole. Start taking on: January 22, 2022   carvedilol 3.125 MG tablet Commonly known as: COREG Take 1 tablet (3.125 mg total) by mouth 2 (two) times daily with a meal.   clopidogrel 75 MG tablet Commonly known as: PLAVIX Take 1 tablet (75 mg total) by mouth daily. Start taking on: January 22, 2022   QUEtiapine 25 MG tablet Commonly known as: SEROQUEL Take 1 tablet (25 mg total) by mouth at bedtime.   rosuvastatin 20 MG tablet Commonly known as: CRESTOR Take 1 tablet (20 mg total) by mouth daily. Start taking on: January 22, 2022   sacubitril-valsartan 24-26 MG Commonly known as: ENTRESTO Take 1 tablet by mouth 2 (two) times daily.   senna-docusate  8.6-50 MG tablet Commonly known as: Senokot-S Take 1 tablet by mouth at bedtime as needed for mild constipation or moderate constipation. What changed:  when to take this reasons to take this        LABORATORY STUDIES CBC    Component Value Date/Time   WBC 11.0 (H) 01/21/2022 0328   RBC 5.58 01/21/2022 0328   HGB 17.1 (H) 01/21/2022 0328   HCT 50.0 01/21/2022 0328   PLT 356 01/21/2022 0328   MCV 89.6 01/21/2022 0328   MCH 30.6 01/21/2022 0328   MCHC 34.2 01/21/2022 0328   RDW 13.7 01/21/2022 0328   LYMPHSABS 1.0 01/15/2022 1908   MONOABS 1.1 (H) 01/15/2022 1908   EOSABS 0.0 01/15/2022 1908   BASOSABS 0.1 01/15/2022 1908   CMP    Component Value Date/Time   NA 140 01/21/2022 0328   K 4.4 01/21/2022 0328   CL 119 (H) 01/21/2022 0328   CO2 15 (L) 01/21/2022 0328   GLUCOSE 118 (H) 01/21/2022 0328   BUN 37 (H) 01/21/2022 0328   CREATININE 1.07 01/21/2022 0328   CALCIUM 9.0 01/21/2022 0328   PROT 6.7 01/15/2022 1908   ALBUMIN 3.9 01/15/2022 1908   AST 28 01/15/2022 1908   ALT 15 01/15/2022 1908   ALKPHOS 56 01/15/2022 1908   BILITOT 1.7 (H) 01/15/2022 1908   GFRNONAA >60 01/21/2022 0328   COAGS Lab Results  Component Value  Stroke Discharge Summary  Patient ID: Casey Rangel   MRN: [id number]      DOB: [birth year]  Date of Admission: 01/15/2022 Date of Discharge: 01/21/2022  Attending Physician:  Stroke, Md, MD, Stroke MD Consultant(s):    cardiology and pulmonary/intensive care  Patient's PCP:  Pcp, No  DISCHARGE DIAGNOSIS: Stroke:  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 Cardioembolic source Principal Problem:   Acute ischemic left MCA stroke [facility]) Active Problems:   Malnutrition of moderate degree   Acute combined systolic and diastolic heart failure ([place])   Allergies as of 01/21/2022       Reactions   Penicillins Other (See Comments)   Unknown         Medication List     STOP taking these medications    docusate sodium 100 MG capsule Commonly known as: COLACE   enoxaparin 40 MG/0.4ML injection Commonly known as: LOVENOX   HYDROcodone-acetaminophen 5-325 MG tablet Commonly known as: NORCO/VICODIN   traZODone 50 MG tablet Commonly known as: DESYREL   Vitamin D (Ergocalciferol) 1.25 MG (50000 UNIT) Caps capsule Commonly known as: DRISDOL       TAKE these medications    aspirin EC 81 MG tablet Take 1 tablet (81 mg total) by mouth daily. Swallow whole. Start taking on: January 22, 2022   carvedilol 3.125 MG tablet Commonly known as: COREG Take 1 tablet (3.125 mg total) by mouth 2 (two) times daily with a meal.   clopidogrel 75 MG tablet Commonly known as: PLAVIX Take 1 tablet (75 mg total) by mouth daily. Start taking on: January 22, 2022   QUEtiapine 25 MG tablet Commonly known as: SEROQUEL Take 1 tablet (25 mg total) by mouth at bedtime.   rosuvastatin 20 MG tablet Commonly known as: CRESTOR Take 1 tablet (20 mg total) by mouth daily. Start taking on: January 22, 2022   sacubitril-valsartan 24-26 MG Commonly known as: ENTRESTO Take 1 tablet by mouth 2 (two) times daily.   senna-docusate  8.6-50 MG tablet Commonly known as: Senokot-S Take 1 tablet by mouth at bedtime as needed for mild constipation or moderate constipation. What changed:  when to take this reasons to take this        LABORATORY STUDIES CBC    Component Value Date/Time   WBC 11.0 (H) 01/21/2022 0328   RBC 5.58 01/21/2022 0328   HGB 17.1 (H) 01/21/2022 0328   HCT 50.0 01/21/2022 0328   PLT 356 01/21/2022 0328   MCV 89.6 01/21/2022 0328   MCH 30.6 01/21/2022 0328   MCHC 34.2 01/21/2022 0328   RDW 13.7 01/21/2022 0328   LYMPHSABS 1.0 01/15/2022 1908   MONOABS 1.1 (H) 01/15/2022 1908   EOSABS 0.0 01/15/2022 1908   BASOSABS 0.1 01/15/2022 1908   CMP    Component Value Date/Time   NA 140 01/21/2022 0328   K 4.4 01/21/2022 0328   CL 119 (H) 01/21/2022 0328   CO2 15 (L) 01/21/2022 0328   GLUCOSE 118 (H) 01/21/2022 0328   BUN 37 (H) 01/21/2022 0328   CREATININE 1.07 01/21/2022 0328   CALCIUM 9.0 01/21/2022 0328   PROT 6.7 01/15/2022 1908   ALBUMIN 3.9 01/15/2022 1908   AST 28 01/15/2022 1908   ALT 15 01/15/2022 1908   ALKPHOS 56 01/15/2022 1908   BILITOT 1.7 (H) 01/15/2022 1908   GFRNONAA >60 01/21/2022 0328   COAGS Lab Results  Component Value  and distal right MCA branches are widely patent. Posterior circulation: Atheromatous irregularity within the dominant left V4 segment without significant stenosis. Left PICA patent. Right vertebral artery largely terminates in PICA. Right PICA patent as well. Hypoplastic right V4 segment largely occludes beyond the takeoff of the right PICA. Basilar irregular and somewhat diminutive but patent to its distal aspect. Superior cerebral arteries patent bilaterally. Predominant fetal type origin of the PCAs. Both PCAs irregular but patent to their distal aspects without high-grade stenosis. Venous sinuses: Grossly patent allowing for timing the contrast bolus. Anatomic variants: As above.  No aneurysm. Review of the MIP images confirms the above findings CT Brain Perfusion Findings: ASPECTS: 10. CBF (<30%) Volume: 55m Perfusion (Tmax>6.0s) volume: 1913mMismatch Volume: 1908mnfarction Location:No evidence for acute core  infarct by CT perfusion. Large 190 cc perfusion deficit within the left MCA distribution related to the above described severe stenosis/near occlusion at the left ICA terminus. IMPRESSION: 1. 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 While this finding is somewhat age indeterminate, subtle surrounding soft tissue attenuation suggests that there is likely an acute component. No visible downstream left MCA embolic occlusion. 2. 190 mL perfusion deficit within the left MCA distribution related to the above described severe stenosis/near occlusion at the left ICA terminus. No acute core infarct by CT perfusion. 3. Moderate atheromatous disease elsewhere about the major arterial vasculature of the head and neck. No other proximal high-grade or correctable stenosis. 4. No acute traumatic injury within the cervical spine. 5. Aortic Atherosclerosis (ICD10-I70.0) and Emphysema (ICD10-J43.9). Critical Value/emergent results were called by telephone at the time of interpretation on 01/15/2022 at 7:40 p.m. to provider Dr. BHACurly Shoresho verbally acknowledged these results. Electronically Signed   By: BenJeannine BogaD.   On: 01/15/2022 20:36   DG CHEST PORT 1 VIEW  Result Date: 01/16/2022 CLINICAL DATA:  Hypoxia, altered mental status. EXAM: PORTABLE CHEST 1 VIEW COMPARISON:  Chest x-ray 10/17/2020 FINDINGS: Aorta is ectatic in the heart is mildly enlarged, unchanged. The lungs are clear. There is no pleural effusion or pneumothorax. No acute fractures are seen. IMPRESSION: 1. No acute cardiopulmonary process. No significant interval change. Electronically Signed   By: AmyRonney AstersD.   On: 01/16/2022 00:45   DG Swallowing Func-Speech Pathology  Result Date: 01/16/2022 Table formatting from the original result was not included. Objective Swallowing Evaluation: Type of Study: Bedside Swallow Evaluation  Patient Details Name: Casey CayerN: [id number] of  Birth: 9/208/12/1952day's Date: 01/16/2022 Time: SLP Start Time (ACUTE ONLY): 1254 -SLP Stop Time (ACUTE ONLY): 1311 SLP Time Calculation (min) (ACUTE ONLY): 17 min Past Medical History: Past Medical History: Diagnosis Date  GERD (gastroesophageal reflux disease)   Hiatal hernia  Past Surgical History: Past Surgical History: Procedure Laterality Date  INTRAMEDULLARY (IM) NAIL INTERTROCHANTERIC Left 10/17/2020  Procedure: INTRAMEDULLARY (IM) NAIL INTERTROCHANTRIC;  Surgeon: MenHessie KnowsD;  Location: [facility] ORS;  Service: Orthopedics;  Laterality: Left;  IR CT HEAD LTD  01/15/2022  IR PERCUTANEOUS ART THROMBECTOMY/INFUSION INTRACRANIAL INC DIAG ANGIO  01/15/2022  IR PTA INTRACRANIAL  01/15/2022  IR US [place] VASC ACCESS RIGHT  01/15/2022  RADIOLOGY WITH ANESTHESIA N/A 01/15/2022  Procedure: RADIOLOGY WITH ANESTHESIA;  Surgeon: DevLuanne BrasD;  [facility];  Laterality: N/A;  T9-T11 fusion in 1990   HPI: Pt is a [age]. male who presented with aphasia and right-sided weakness. MRI brain 5/31: Patchy acute ischemic nonhemorrhagic left  and distal right MCA branches are widely patent. Posterior circulation: Atheromatous irregularity within the dominant left V4 segment without significant stenosis. Left PICA patent. Right vertebral artery largely terminates in PICA. Right PICA patent as well. Hypoplastic right V4 segment largely occludes beyond the takeoff of the right PICA. Basilar irregular and somewhat diminutive but patent to its distal aspect. Superior cerebral arteries patent bilaterally. Predominant fetal type origin of the PCAs. Both PCAs irregular but patent to their distal aspects without high-grade stenosis. Venous sinuses: Grossly patent allowing for timing the contrast bolus. Anatomic variants: As above.  No aneurysm. Review of the MIP images confirms the above findings CT Brain Perfusion Findings: ASPECTS: 10. CBF (<30%) Volume: 55m Perfusion (Tmax>6.0s) volume: 1913mMismatch Volume: 1908mnfarction Location:No evidence for acute core  infarct by CT perfusion. Large 190 cc perfusion deficit within the left MCA distribution related to the above described severe stenosis/near occlusion at the left ICA terminus. IMPRESSION: 1. 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 While this finding is somewhat age indeterminate, subtle surrounding soft tissue attenuation suggests that there is likely an acute component. No visible downstream left MCA embolic occlusion. 2. 190 mL perfusion deficit within the left MCA distribution related to the above described severe stenosis/near occlusion at the left ICA terminus. No acute core infarct by CT perfusion. 3. Moderate atheromatous disease elsewhere about the major arterial vasculature of the head and neck. No other proximal high-grade or correctable stenosis. 4. No acute traumatic injury within the cervical spine. 5. Aortic Atherosclerosis (ICD10-I70.0) and Emphysema (ICD10-J43.9). Critical Value/emergent results were called by telephone at the time of interpretation on 01/15/2022 at 7:40 p.m. to provider Dr. BHACurly Shoresho verbally acknowledged these results. Electronically Signed   By: BenJeannine BogaD.   On: 01/15/2022 20:36   DG CHEST PORT 1 VIEW  Result Date: 01/16/2022 CLINICAL DATA:  Hypoxia, altered mental status. EXAM: PORTABLE CHEST 1 VIEW COMPARISON:  Chest x-ray 10/17/2020 FINDINGS: Aorta is ectatic in the heart is mildly enlarged, unchanged. The lungs are clear. There is no pleural effusion or pneumothorax. No acute fractures are seen. IMPRESSION: 1. No acute cardiopulmonary process. No significant interval change. Electronically Signed   By: AmyRonney AstersD.   On: 01/16/2022 00:45   DG Swallowing Func-Speech Pathology  Result Date: 01/16/2022 Table formatting from the original result was not included. Objective Swallowing Evaluation: Type of Study: Bedside Swallow Evaluation  Patient Details Name: Casey CayerN: [id number] of  Birth: 9/208/12/1952day's Date: 01/16/2022 Time: SLP Start Time (ACUTE ONLY): 1254 -SLP Stop Time (ACUTE ONLY): 1311 SLP Time Calculation (min) (ACUTE ONLY): 17 min Past Medical History: Past Medical History: Diagnosis Date  GERD (gastroesophageal reflux disease)   Hiatal hernia  Past Surgical History: Past Surgical History: Procedure Laterality Date  INTRAMEDULLARY (IM) NAIL INTERTROCHANTERIC Left 10/17/2020  Procedure: INTRAMEDULLARY (IM) NAIL INTERTROCHANTRIC;  Surgeon: MenHessie KnowsD;  Location: [facility] ORS;  Service: Orthopedics;  Laterality: Left;  IR CT HEAD LTD  01/15/2022  IR PERCUTANEOUS ART THROMBECTOMY/INFUSION INTRACRANIAL INC DIAG ANGIO  01/15/2022  IR PTA INTRACRANIAL  01/15/2022  IR US [place]IDE VASC ACCESS RIGHT  01/15/2022  RADIOLOGY WITH ANESTHESIA N/A 01/15/2022  Procedure: RADIOLOGY WITH ANESTHESIA;  Surgeon: DevLuanne BrasD;  [facility];  Laterality: N/A;  T9-T11 fusion in 1990   HPI: Pt is a [age]o. male who presented with aphasia and right-sided weakness. 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 Cardioembolic source Principal Problem:   Acute ischemic left MCA stroke [facility]) Active Problems:   Malnutrition of moderate degree   Acute combined systolic and diastolic heart failure ([place])   Allergies as of 01/21/2022       Reactions   Penicillins Other (See Comments)   Unknown         Medication List     STOP taking these medications    docusate sodium 100 MG capsule Commonly known as: COLACE   enoxaparin 40 MG/0.4ML injection Commonly known as: LOVENOX   HYDROcodone-acetaminophen 5-325 MG tablet Commonly known as: NORCO/VICODIN   traZODone 50 MG tablet Commonly known as: DESYREL   Vitamin D (Ergocalciferol) 1.25 MG (50000 UNIT) Caps capsule Commonly known as: DRISDOL       TAKE these medications    aspirin EC 81 MG tablet Take 1 tablet (81 mg total) by mouth daily. Swallow whole. Start taking on: January 22, 2022   carvedilol 3.125 MG tablet Commonly known as: COREG Take 1 tablet (3.125 mg total) by mouth 2 (two) times daily with a meal.   clopidogrel 75 MG tablet Commonly known as: PLAVIX Take 1 tablet (75 mg total) by mouth daily. Start taking on: January 22, 2022   QUEtiapine 25 MG tablet Commonly known as: SEROQUEL Take 1 tablet (25 mg total) by mouth at bedtime.   rosuvastatin 20 MG tablet Commonly known as: CRESTOR Take 1 tablet (20 mg total) by mouth daily. Start taking on: January 22, 2022   sacubitril-valsartan 24-26 MG Commonly known as: ENTRESTO Take 1 tablet by mouth 2 (two) times daily.   senna-docusate  8.6-50 MG tablet Commonly known as: Senokot-S Take 1 tablet by mouth at bedtime as needed for mild constipation or moderate constipation. What changed:  when to take this reasons to take this        LABORATORY STUDIES CBC    Component Value Date/Time   WBC 11.0 (H) 01/21/2022 0328   RBC 5.58 01/21/2022 0328   HGB 17.1 (H) 01/21/2022 0328   HCT 50.0 01/21/2022 0328   PLT 356 01/21/2022 0328   MCV 89.6 01/21/2022 0328   MCH 30.6 01/21/2022 0328   MCHC 34.2 01/21/2022 0328   RDW 13.7 01/21/2022 0328   LYMPHSABS 1.0 01/15/2022 1908   MONOABS 1.1 (H) 01/15/2022 1908   EOSABS 0.0 01/15/2022 1908   BASOSABS 0.1 01/15/2022 1908   CMP    Component Value Date/Time   NA 140 01/21/2022 0328   K 4.4 01/21/2022 0328   CL 119 (H) 01/21/2022 0328   CO2 15 (L) 01/21/2022 0328   GLUCOSE 118 (H) 01/21/2022 0328   BUN 37 (H) 01/21/2022 0328   CREATININE 1.07 01/21/2022 0328   CALCIUM 9.0 01/21/2022 0328   PROT 6.7 01/15/2022 1908   ALBUMIN 3.9 01/15/2022 1908   AST 28 01/15/2022 1908   ALT 15 01/15/2022 1908   ALKPHOS 56 01/15/2022 1908   BILITOT 1.7 (H) 01/15/2022 1908   GFRNONAA >60 01/21/2022 0328   COAGS Lab Results  Component Value  tube. Multiple delay angiograms of the left ICA with frontal and lateral views showed stable patency of the left ICA terminus with brisk contrast opacification of the left anterior circulation. Flat panel CT of the head was obtained and post processed in a separate workstation with concurrent attending physician supervision. Selected images were sent to PACS. No evidence of hemorrhagic  complication. Right common femoral artery angiogram was obtained in right anterior oblique and lateral views. The puncture is at the level of the common femoral artery. The artery has mild atherosclerotic changes without significant stenosis, adequate for closure device. The sheath was exchanged over the wire for an 8 [place] Angio-Seal which was utilized for access closure. Immediate hemostasis was achieved. IMPRESSION: Successful mechanical thrombectomy followed by angioplasty for treatment of near occlusive the stenosis and thrombus in the left ICA terminus with complete recanalization and brisk left anterior circulation anterograde flow. No evidence of thromboembolic or hemorrhagic complication. PLAN: Transfer to ICU for continued care. Continue ASA 325 mg q.d. Electronically Signed   By: Pedro Earls M.D.   On: 01/16/2022 09:43   CT HEAD CODE STROKE WO CONTRAST  Result Date: 01/15/2022 CLINICAL DATA:  Code stroke. EXAM: CT HEAD WITHOUT CONTRAST TECHNIQUE: Contiguous axial images were obtained from the base of the skull through the vertex without intravenous contrast. RADIATION DOSE REDUCTION: This exam was performed according to the departmental dose-optimization program which includes automated exposure control, adjustment of the mA and/or kV according to patient size and/or use of iterative reconstruction technique. COMPARISON:  None available. FINDINGS: Brain: Generalized age-related cerebral atrophy with moderately advanced chronic microvascular ischemic disease. Several remote lacunar infarcts present about the bilateral basal ganglia and thalami. Multifocal areas of encephalomalacia involving the right parietal and occipital lobes as well as the left temporal occipital region, consistent with chronic ischemic infarcts. Small remote right cerebellar infarct. No acute intracranial hemorrhage. Note made of a 7 mm partially calcified lesion at the fourth ventricular outflow tract (series  2, image 7), indeterminate, but favored to be vascular in nature, possibly reflecting aneurysm. No other mass lesion, mass effect or midline shift. No hydrocephalus or extra-axial fluid collection. Vascular: No hyperdense vessel. 7 mm fourth ventricular lesion as above. Calcified atherosclerosis present at skull base. Skull: Scalp soft tissues and calvarium within normal limits. Sinuses/Orbits: Left gaze noted. Scattered mucosal thickening noted about the ethmoidal air cells and maxillary sinuses. Mastoid air cells are clear. Other: None. ASPECTS [facility] Stroke Program Early CT Score) - Ganglionic level infarction (caudate, lentiform nuclei, internal capsule, insula, M1-M3 cortex): 7 - Supraganglionic infarction (M4-M6 cortex): 3 Total score (0-10 with 10 being normal): 10 IMPRESSION: 1. No acute intracranial abnormality. 2. ASPECTS is 10. 3. Atrophy with moderate chronic small vessel ischemic disease with multiple remote ischemic infarcts as above. 4. Approximate 7 mm lesion at the level of the fourth ventricular outflow tract, indeterminate, but favored to be vascular in nature, potentially reflecting aneurysm. These results were communicated to Dr. Curly Shores at 7:19 pm on 01/15/2022 by text page via the [facility] messaging system. Electronically Signed   By: Jeannine Boga M.D.   On: 01/15/2022 19:23   VAS [place] LOWER EXTREMITY VENOUS (DVT)  Result Date: 01/17/2022  Lower Venous DVT Study Patient Name:  Casey Rangel  Date of Exam:   01/17/2022 Medical Rec #: [id number]       Accession #:    [id number] Date of Birth: 1951/05/27       Patient Gender: M Patient Age:  and distal right MCA branches are widely patent. Posterior circulation: Atheromatous irregularity within the dominant left V4 segment without significant stenosis. Left PICA patent. Right vertebral artery largely terminates in PICA. Right PICA patent as well. Hypoplastic right V4 segment largely occludes beyond the takeoff of the right PICA. Basilar irregular and somewhat diminutive but patent to its distal aspect. Superior cerebral arteries patent bilaterally. Predominant fetal type origin of the PCAs. Both PCAs irregular but patent to their distal aspects without high-grade stenosis. Venous sinuses: Grossly patent allowing for timing the contrast bolus. Anatomic variants: As above.  No aneurysm. Review of the MIP images confirms the above findings CT Brain Perfusion Findings: ASPECTS: 10. CBF (<30%) Volume: 55m Perfusion (Tmax>6.0s) volume: 1913mMismatch Volume: 1908mnfarction Location:No evidence for acute core  infarct by CT perfusion. Large 190 cc perfusion deficit within the left MCA distribution related to the above described severe stenosis/near occlusion at the left ICA terminus. IMPRESSION: 1. 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 While this finding is somewhat age indeterminate, subtle surrounding soft tissue attenuation suggests that there is likely an acute component. No visible downstream left MCA embolic occlusion. 2. 190 mL perfusion deficit within the left MCA distribution related to the above described severe stenosis/near occlusion at the left ICA terminus. No acute core infarct by CT perfusion. 3. Moderate atheromatous disease elsewhere about the major arterial vasculature of the head and neck. No other proximal high-grade or correctable stenosis. 4. No acute traumatic injury within the cervical spine. 5. Aortic Atherosclerosis (ICD10-I70.0) and Emphysema (ICD10-J43.9). Critical Value/emergent results were called by telephone at the time of interpretation on 01/15/2022 at 7:40 p.m. to provider Dr. BHACurly Shoresho verbally acknowledged these results. Electronically Signed   By: BenJeannine BogaD.   On: 01/15/2022 20:36   DG CHEST PORT 1 VIEW  Result Date: 01/16/2022 CLINICAL DATA:  Hypoxia, altered mental status. EXAM: PORTABLE CHEST 1 VIEW COMPARISON:  Chest x-ray 10/17/2020 FINDINGS: Aorta is ectatic in the heart is mildly enlarged, unchanged. The lungs are clear. There is no pleural effusion or pneumothorax. No acute fractures are seen. IMPRESSION: 1. No acute cardiopulmonary process. No significant interval change. Electronically Signed   By: AmyRonney AstersD.   On: 01/16/2022 00:45   DG Swallowing Func-Speech Pathology  Result Date: 01/16/2022 Table formatting from the original result was not included. Objective Swallowing Evaluation: Type of Study: Bedside Swallow Evaluation  Patient Details Name: Casey CayerN: [id number] of  Birth: 9/208/12/1952day's Date: 01/16/2022 Time: SLP Start Time (ACUTE ONLY): 1254 -SLP Stop Time (ACUTE ONLY): 1311 SLP Time Calculation (min) (ACUTE ONLY): 17 min Past Medical History: Past Medical History: Diagnosis Date  GERD (gastroesophageal reflux disease)   Hiatal hernia  Past Surgical History: Past Surgical History: Procedure Laterality Date  INTRAMEDULLARY (IM) NAIL INTERTROCHANTERIC Left 10/17/2020  Procedure: INTRAMEDULLARY (IM) NAIL INTERTROCHANTRIC;  Surgeon: MenHessie KnowsD;  Location: [facility] ORS;  Service: Orthopedics;  Laterality: Left;  IR CT HEAD LTD  01/15/2022  IR PERCUTANEOUS ART THROMBECTOMY/INFUSION INTRACRANIAL INC DIAG ANGIO  01/15/2022  IR PTA INTRACRANIAL  01/15/2022  IR US [place]IDE VASC ACCESS RIGHT  01/15/2022  RADIOLOGY WITH ANESTHESIA N/A 01/15/2022  Procedure: RADIOLOGY WITH ANESTHESIA;  Surgeon: DevLuanne BrasD;  [facility];  Laterality: N/A;  T9-T11 fusion in 1990   HPI: Pt is a [age]o. male who presented with aphasia and right-sided weakness. MRI brain 5/31: Patchy acute ischemic nonhemorrhagic left  and distal right MCA branches are widely patent. Posterior circulation: Atheromatous irregularity within the dominant left V4 segment without significant stenosis. Left PICA patent. Right vertebral artery largely terminates in PICA. Right PICA patent as well. Hypoplastic right V4 segment largely occludes beyond the takeoff of the right PICA. Basilar irregular and somewhat diminutive but patent to its distal aspect. Superior cerebral arteries patent bilaterally. Predominant fetal type origin of the PCAs. Both PCAs irregular but patent to their distal aspects without high-grade stenosis. Venous sinuses: Grossly patent allowing for timing the contrast bolus. Anatomic variants: As above.  No aneurysm. Review of the MIP images confirms the above findings CT Brain Perfusion Findings: ASPECTS: 10. CBF (<30%) Volume: 55m Perfusion (Tmax>6.0s) volume: 1913mMismatch Volume: 1908mnfarction Location:No evidence for acute core  infarct by CT perfusion. Large 190 cc perfusion deficit within the left MCA distribution related to the above described severe stenosis/near occlusion at the left ICA terminus. IMPRESSION: 1. 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 While this finding is somewhat age indeterminate, subtle surrounding soft tissue attenuation suggests that there is likely an acute component. No visible downstream left MCA embolic occlusion. 2. 190 mL perfusion deficit within the left MCA distribution related to the above described severe stenosis/near occlusion at the left ICA terminus. No acute core infarct by CT perfusion. 3. Moderate atheromatous disease elsewhere about the major arterial vasculature of the head and neck. No other proximal high-grade or correctable stenosis. 4. No acute traumatic injury within the cervical spine. 5. Aortic Atherosclerosis (ICD10-I70.0) and Emphysema (ICD10-J43.9). Critical Value/emergent results were called by telephone at the time of interpretation on 01/15/2022 at 7:40 p.m. to provider Dr. BHACurly Shoresho verbally acknowledged these results. Electronically Signed   By: BenJeannine BogaD.   On: 01/15/2022 20:36   DG CHEST PORT 1 VIEW  Result Date: 01/16/2022 CLINICAL DATA:  Hypoxia, altered mental status. EXAM: PORTABLE CHEST 1 VIEW COMPARISON:  Chest x-ray 10/17/2020 FINDINGS: Aorta is ectatic in the heart is mildly enlarged, unchanged. The lungs are clear. There is no pleural effusion or pneumothorax. No acute fractures are seen. IMPRESSION: 1. No acute cardiopulmonary process. No significant interval change. Electronically Signed   By: AmyRonney AstersD.   On: 01/16/2022 00:45   DG Swallowing Func-Speech Pathology  Result Date: 01/16/2022 Table formatting from the original result was not included. Objective Swallowing Evaluation: Type of Study: Bedside Swallow Evaluation  Patient Details Name: Casey CayerN: [id number] of  Birth: 9/208/12/1952day's Date: 01/16/2022 Time: SLP Start Time (ACUTE ONLY): 1254 -SLP Stop Time (ACUTE ONLY): 1311 SLP Time Calculation (min) (ACUTE ONLY): 17 min Past Medical History: Past Medical History: Diagnosis Date  GERD (gastroesophageal reflux disease)   Hiatal hernia  Past Surgical History: Past Surgical History: Procedure Laterality Date  INTRAMEDULLARY (IM) NAIL INTERTROCHANTERIC Left 10/17/2020  Procedure: INTRAMEDULLARY (IM) NAIL INTERTROCHANTRIC;  Surgeon: MenHessie KnowsD;  Location: [facility] ORS;  Service: Orthopedics;  Laterality: Left;  IR CT HEAD LTD  01/15/2022  IR PERCUTANEOUS ART THROMBECTOMY/INFUSION INTRACRANIAL INC DIAG ANGIO  01/15/2022  IR PTA INTRACRANIAL  01/15/2022  IR US [place]IDE VASC ACCESS RIGHT  01/15/2022  RADIOLOGY WITH ANESTHESIA N/A 01/15/2022  Procedure: RADIOLOGY WITH ANESTHESIA;  Surgeon: DevLuanne BrasD;  [facility]  Laterality: N/A;  T9-T11 fusion in 1990   HPI: Pt is a [age]o. male who presented with aphasia and right-sided weakness. 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 Cardioembolic source Principal Problem:   Acute ischemic left MCA stroke [facility]) Active Problems:   Malnutrition of moderate degree   Acute combined systolic and diastolic heart failure ([place])   Allergies as of 01/21/2022       Reactions   Penicillins Other (See Comments)   Unknown         Medication List     STOP taking these medications    docusate sodium 100 MG capsule Commonly known as: COLACE   enoxaparin 40 MG/0.4ML injection Commonly known as: LOVENOX   HYDROcodone-acetaminophen 5-325 MG tablet Commonly known as: NORCO/VICODIN   traZODone 50 MG tablet Commonly known as: DESYREL   Vitamin D (Ergocalciferol) 1.25 MG (50000 UNIT) Caps capsule Commonly known as: DRISDOL       TAKE these medications    aspirin EC 81 MG tablet Take 1 tablet (81 mg total) by mouth daily. Swallow whole. Start taking on: January 22, 2022   carvedilol 3.125 MG tablet Commonly known as: COREG Take 1 tablet (3.125 mg total) by mouth 2 (two) times daily with a meal.   clopidogrel 75 MG tablet Commonly known as: PLAVIX Take 1 tablet (75 mg total) by mouth daily. Start taking on: January 22, 2022   QUEtiapine 25 MG tablet Commonly known as: SEROQUEL Take 1 tablet (25 mg total) by mouth at bedtime.   rosuvastatin 20 MG tablet Commonly known as: CRESTOR Take 1 tablet (20 mg total) by mouth daily. Start taking on: January 22, 2022   sacubitril-valsartan 24-26 MG Commonly known as: ENTRESTO Take 1 tablet by mouth 2 (two) times daily.   senna-docusate  8.6-50 MG tablet Commonly known as: Senokot-S Take 1 tablet by mouth at bedtime as needed for mild constipation or moderate constipation. What changed:  when to take this reasons to take this        LABORATORY STUDIES CBC    Component Value Date/Time   WBC 11.0 (H) 01/21/2022 0328   RBC 5.58 01/21/2022 0328   HGB 17.1 (H) 01/21/2022 0328   HCT 50.0 01/21/2022 0328   PLT 356 01/21/2022 0328   MCV 89.6 01/21/2022 0328   MCH 30.6 01/21/2022 0328   MCHC 34.2 01/21/2022 0328   RDW 13.7 01/21/2022 0328   LYMPHSABS 1.0 01/15/2022 1908   MONOABS 1.1 (H) 01/15/2022 1908   EOSABS 0.0 01/15/2022 1908   BASOSABS 0.1 01/15/2022 1908   CMP    Component Value Date/Time   NA 140 01/21/2022 0328   K 4.4 01/21/2022 0328   CL 119 (H) 01/21/2022 0328   CO2 15 (L) 01/21/2022 0328   GLUCOSE 118 (H) 01/21/2022 0328   BUN 37 (H) 01/21/2022 0328   CREATININE 1.07 01/21/2022 0328   CALCIUM 9.0 01/21/2022 0328   PROT 6.7 01/15/2022 1908   ALBUMIN 3.9 01/15/2022 1908   AST 28 01/15/2022 1908   ALT 15 01/15/2022 1908   ALKPHOS 56 01/15/2022 1908   BILITOT 1.7 (H) 01/15/2022 1908   GFRNONAA >60 01/21/2022 0328   COAGS Lab Results  Component Value  ECHOCARDIOGRAM COMPLETE  Result Date: 01/16/2022    ECHOCARDIOGRAM REPORT   Patient Name:   Casey Rangel Date of Exam: 01/16/2022 Medical Rec #:  [id number]      Height:       70.0 in Accession #:    [id number]     Weight:       181.7 lb Date of Birth:  March 19, 1951      BSA:          2.004 m Patient Age:    35 years       BP:           132/84 mmHg Patient Gender: M              HR:           74 bpm. Exam Location:  Inpatient Procedure: 2D Echo, Cardiac Doppler and  Color Doppler Indications:    Stroke  History:        Patient has no prior history of Echocardiogram examinations.                 Risk Factors:Former Smoker. GERD.  Sonographer:    Clayton Lefort RDCS (AE) Referring Phys: [id number] Lorenza Chick  Sonographer Comments: No subcostal window. IMPRESSIONS  1. Left ventricular ejection fraction, by estimation, is 35 to 40%. The left ventricle has moderately decreased function. The left ventricle demonstrates global hypokinesis. There is severe concentric left ventricular hypertrophy. Left ventricular diastolic function could not be evaluated.  2. Right ventricular systolic function is normal. The right ventricular size is normal.  3. A small pericardial effusion is present. The pericardial effusion is anterior to the right ventricle. There is no evidence of cardiac tamponade.  4. The mitral valve is normal in structure. No evidence of mitral valve regurgitation. No evidence of mitral stenosis.  5. The aortic valve is normal in structure. Aortic valve regurgitation is not visualized. No aortic stenosis is present.  6. 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  7. The inferior vena cava is normal in size with greater than 50% respiratory variability, suggesting right atrial pressure of 3 mmHg. FINDINGS  Left Ventricle: Left ventricular ejection fraction, by estimation, is 35 to 40%. The left ventricle has moderately decreased function. The left ventricle demonstrates global hypokinesis. The left ventricular internal cavity size was normal in size. There is severe concentric left ventricular hypertrophy. Left ventricular diastolic function could not be evaluated. Right Ventricle: The right ventricular size is normal. No increase in right ventricular wall thickness. Right ventricular systolic function is normal. Left Atrium: Left atrial size was normal in size. Right Atrium: Right atrial size was normal in size. Pericardium:  A small pericardial effusion is present. The pericardial effusion is anterior to the right ventricle. There is no evidence of cardiac tamponade. Mitral Valve: The mitral valve is normal in structure. No evidence of mitral valve regurgitation. No evidence of mitral valve stenosis. MV peak gradient, 7.0 mmHg. The mean mitral valve gradient is 3.0 mmHg. Tricuspid Valve: The tricuspid valve is normal in structure. Tricuspid valve regurgitation is not demonstrated. No evidence of tricuspid stenosis. Aortic Valve: The aortic valve is normal in structure. Aortic valve regurgitation is not visualized. No aortic stenosis is present. Aortic valve mean gradient measures 3.0 mmHg. Aortic valve peak gradient measures 5.4 mmHg. Aortic valve area, by VTI measures 2.86 cm. Pulmonic Valve: The pulmonic valve was normal in structure. Pulmonic valve regurgitation is not visualized.  and distal right MCA branches are widely patent. Posterior circulation: Atheromatous irregularity within the dominant left V4 segment without significant stenosis. Left PICA patent. Right vertebral artery largely terminates in PICA. Right PICA patent as well. Hypoplastic right V4 segment largely occludes beyond the takeoff of the right PICA. Basilar irregular and somewhat diminutive but patent to its distal aspect. Superior cerebral arteries patent bilaterally. Predominant fetal type origin of the PCAs. Both PCAs irregular but patent to their distal aspects without high-grade stenosis. Venous sinuses: Grossly patent allowing for timing the contrast bolus. Anatomic variants: As above.  No aneurysm. Review of the MIP images confirms the above findings CT Brain Perfusion Findings: ASPECTS: 10. CBF (<30%) Volume: 55m Perfusion (Tmax>6.0s) volume: 1913mMismatch Volume: 1908mnfarction Location:No evidence for acute core  infarct by CT perfusion. Large 190 cc perfusion deficit within the left MCA distribution related to the above described severe stenosis/near occlusion at the left ICA terminus. IMPRESSION: 1. 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 While this finding is somewhat age indeterminate, subtle surrounding soft tissue attenuation suggests that there is likely an acute component. No visible downstream left MCA embolic occlusion. 2. 190 mL perfusion deficit within the left MCA distribution related to the above described severe stenosis/near occlusion at the left ICA terminus. No acute core infarct by CT perfusion. 3. Moderate atheromatous disease elsewhere about the major arterial vasculature of the head and neck. No other proximal high-grade or correctable stenosis. 4. No acute traumatic injury within the cervical spine. 5. Aortic Atherosclerosis (ICD10-I70.0) and Emphysema (ICD10-J43.9). Critical Value/emergent results were called by telephone at the time of interpretation on 01/15/2022 at 7:40 p.m. to provider Dr. BHACurly Shoresho verbally acknowledged these results. Electronically Signed   By: BenJeannine BogaD.   On: 01/15/2022 20:36   DG CHEST PORT 1 VIEW  Result Date: 01/16/2022 CLINICAL DATA:  Hypoxia, altered mental status. EXAM: PORTABLE CHEST 1 VIEW COMPARISON:  Chest x-ray 10/17/2020 FINDINGS: Aorta is ectatic in the heart is mildly enlarged, unchanged. The lungs are clear. There is no pleural effusion or pneumothorax. No acute fractures are seen. IMPRESSION: 1. No acute cardiopulmonary process. No significant interval change. Electronically Signed   By: AmyRonney AstersD.   On: 01/16/2022 00:45   DG Swallowing Func-Speech Pathology  Result Date: 01/16/2022 Table formatting from the original result was not included. Objective Swallowing Evaluation: Type of Study: Bedside Swallow Evaluation  Patient Details Name: Casey CayerN: [id number] of  Birth: 9/208/12/1952day's Date: 01/16/2022 Time: SLP Start Time (ACUTE ONLY): 1254 -SLP Stop Time (ACUTE ONLY): 1311 SLP Time Calculation (min) (ACUTE ONLY): 17 min Past Medical History: Past Medical History: Diagnosis Date  GERD (gastroesophageal reflux disease)   Hiatal hernia  Past Surgical History: Past Surgical History: Procedure Laterality Date  INTRAMEDULLARY (IM) NAIL INTERTROCHANTERIC Left 10/17/2020  Procedure: INTRAMEDULLARY (IM) NAIL INTERTROCHANTRIC;  Surgeon: MenHessie KnowsD;  Location: [facility] ORS;  Service: Orthopedics;  Laterality: Left;  IR CT HEAD LTD  01/15/2022  IR PERCUTANEOUS ART THROMBECTOMY/INFUSION INTRACRANIAL INC DIAG ANGIO  01/15/2022  IR PTA INTRACRANIAL  01/15/2022  IR US [place]IDE VASC ACCESS RIGHT  01/15/2022  RADIOLOGY WITH ANESTHESIA N/A 01/15/2022  Procedure: RADIOLOGY WITH ANESTHESIA;  Surgeon: DevLuanne BrasD;  [facility]  Laterality: N/A;  T9-T11 fusion in 1990   HPI: Pt is a [age]o. male who presented with aphasia and right-sided weakness. MRI brain 5/31: Patchy acute ischemic nonhemorrhagic left  and distal right MCA branches are widely patent. Posterior circulation: Atheromatous irregularity within the dominant left V4 segment without significant stenosis. Left PICA patent. Right vertebral artery largely terminates in PICA. Right PICA patent as well. Hypoplastic right V4 segment largely occludes beyond the takeoff of the right PICA. Basilar irregular and somewhat diminutive but patent to its distal aspect. Superior cerebral arteries patent bilaterally. Predominant fetal type origin of the PCAs. Both PCAs irregular but patent to their distal aspects without high-grade stenosis. Venous sinuses: Grossly patent allowing for timing the contrast bolus. Anatomic variants: As above.  No aneurysm. Review of the MIP images confirms the above findings CT Brain Perfusion Findings: ASPECTS: 10. CBF (<30%) Volume: 55m Perfusion (Tmax>6.0s) volume: 1913mMismatch Volume: 1908mnfarction Location:No evidence for acute core  infarct by CT perfusion. Large 190 cc perfusion deficit within the left MCA distribution related to the above described severe stenosis/near occlusion at the left ICA terminus. IMPRESSION: 1. 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 While this finding is somewhat age indeterminate, subtle surrounding soft tissue attenuation suggests that there is likely an acute component. No visible downstream left MCA embolic occlusion. 2. 190 mL perfusion deficit within the left MCA distribution related to the above described severe stenosis/near occlusion at the left ICA terminus. No acute core infarct by CT perfusion. 3. Moderate atheromatous disease elsewhere about the major arterial vasculature of the head and neck. No other proximal high-grade or correctable stenosis. 4. No acute traumatic injury within the cervical spine. 5. Aortic Atherosclerosis (ICD10-I70.0) and Emphysema (ICD10-J43.9). Critical Value/emergent results were called by telephone at the time of interpretation on 01/15/2022 at 7:40 p.m. to provider Dr. BHACurly Shoresho verbally acknowledged these results. Electronically Signed   By: BenJeannine BogaD.   On: 01/15/2022 20:36   DG CHEST PORT 1 VIEW  Result Date: 01/16/2022 CLINICAL DATA:  Hypoxia, altered mental status. EXAM: PORTABLE CHEST 1 VIEW COMPARISON:  Chest x-ray 10/17/2020 FINDINGS: Aorta is ectatic in the heart is mildly enlarged, unchanged. The lungs are clear. There is no pleural effusion or pneumothorax. No acute fractures are seen. IMPRESSION: 1. No acute cardiopulmonary process. No significant interval change. Electronically Signed   By: AmyRonney AstersD.   On: 01/16/2022 00:45   DG Swallowing Func-Speech Pathology  Result Date: 01/16/2022 Table formatting from the original result was not included. Objective Swallowing Evaluation: Type of Study: Bedside Swallow Evaluation  Patient Details Name: Casey CayerN: [id number] of  Birth: 9/208/12/1952day's Date: 01/16/2022 Time: SLP Start Time (ACUTE ONLY): 1254 -SLP Stop Time (ACUTE ONLY): 1311 SLP Time Calculation (min) (ACUTE ONLY): 17 min Past Medical History: Past Medical History: Diagnosis Date  GERD (gastroesophageal reflux disease)   Hiatal hernia  Past Surgical History: Past Surgical History: Procedure Laterality Date  INTRAMEDULLARY (IM) NAIL INTERTROCHANTERIC Left 10/17/2020  Procedure: INTRAMEDULLARY (IM) NAIL INTERTROCHANTRIC;  Surgeon: MenHessie KnowsD;  Location: [facility] ORS;  Service: Orthopedics;  Laterality: Left;  IR CT HEAD LTD  01/15/2022  IR PERCUTANEOUS ART THROMBECTOMY/INFUSION INTRACRANIAL INC DIAG ANGIO  01/15/2022  IR PTA INTRACRANIAL  01/15/2022  IR US [place]IDE VASC ACCESS RIGHT  01/15/2022  RADIOLOGY WITH ANESTHESIA N/A 01/15/2022  Procedure: RADIOLOGY WITH ANESTHESIA;  Surgeon: DevLuanne BrasD;  [facility]  Laterality: N/A;  T9-T11 fusion in 1990   HPI: Pt is a [age]o. male who presented with aphasia and right-sided weakness. 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 Cardioembolic source Principal Problem:   Acute ischemic left MCA stroke [facility]) Active Problems:   Malnutrition of moderate degree   Acute combined systolic and diastolic heart failure ([place])   Allergies as of 01/21/2022       Reactions   Penicillins Other (See Comments)   Unknown         Medication List     STOP taking these medications    docusate sodium 100 MG capsule Commonly known as: COLACE   enoxaparin 40 MG/0.4ML injection Commonly known as: LOVENOX   HYDROcodone-acetaminophen 5-325 MG tablet Commonly known as: NORCO/VICODIN   traZODone 50 MG tablet Commonly known as: DESYREL   Vitamin D (Ergocalciferol) 1.25 MG (50000 UNIT) Caps capsule Commonly known as: DRISDOL       TAKE these medications    aspirin EC 81 MG tablet Take 1 tablet (81 mg total) by mouth daily. Swallow whole. Start taking on: January 22, 2022   carvedilol 3.125 MG tablet Commonly known as: COREG Take 1 tablet (3.125 mg total) by mouth 2 (two) times daily with a meal.   clopidogrel 75 MG tablet Commonly known as: PLAVIX Take 1 tablet (75 mg total) by mouth daily. Start taking on: January 22, 2022   QUEtiapine 25 MG tablet Commonly known as: SEROQUEL Take 1 tablet (25 mg total) by mouth at bedtime.   rosuvastatin 20 MG tablet Commonly known as: CRESTOR Take 1 tablet (20 mg total) by mouth daily. Start taking on: January 22, 2022   sacubitril-valsartan 24-26 MG Commonly known as: ENTRESTO Take 1 tablet by mouth 2 (two) times daily.   senna-docusate  8.6-50 MG tablet Commonly known as: Senokot-S Take 1 tablet by mouth at bedtime as needed for mild constipation or moderate constipation. What changed:  when to take this reasons to take this        LABORATORY STUDIES CBC    Component Value Date/Time   WBC 11.0 (H) 01/21/2022 0328   RBC 5.58 01/21/2022 0328   HGB 17.1 (H) 01/21/2022 0328   HCT 50.0 01/21/2022 0328   PLT 356 01/21/2022 0328   MCV 89.6 01/21/2022 0328   MCH 30.6 01/21/2022 0328   MCHC 34.2 01/21/2022 0328   RDW 13.7 01/21/2022 0328   LYMPHSABS 1.0 01/15/2022 1908   MONOABS 1.1 (H) 01/15/2022 1908   EOSABS 0.0 01/15/2022 1908   BASOSABS 0.1 01/15/2022 1908   CMP    Component Value Date/Time   NA 140 01/21/2022 0328   K 4.4 01/21/2022 0328   CL 119 (H) 01/21/2022 0328   CO2 15 (L) 01/21/2022 0328   GLUCOSE 118 (H) 01/21/2022 0328   BUN 37 (H) 01/21/2022 0328   CREATININE 1.07 01/21/2022 0328   CALCIUM 9.0 01/21/2022 0328   PROT 6.7 01/15/2022 1908   ALBUMIN 3.9 01/15/2022 1908   AST 28 01/15/2022 1908   ALT 15 01/15/2022 1908   ALKPHOS 56 01/15/2022 1908   BILITOT 1.7 (H) 01/15/2022 1908   GFRNONAA >60 01/21/2022 0328   COAGS Lab Results  Component Value  Stroke Discharge Summary  Patient ID: Casey Rangel   MRN: [id number]      DOB: [birth year]  Date of Admission: 01/15/2022 Date of Discharge: 01/21/2022  Attending Physician:  Stroke, Md, MD, Stroke MD Consultant(s):    cardiology and pulmonary/intensive care  Patient's PCP:  Pcp, No  DISCHARGE DIAGNOSIS: Stroke:  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 Cardioembolic source Principal Problem:   Acute ischemic left MCA stroke [facility]) Active Problems:   Malnutrition of moderate degree   Acute combined systolic and diastolic heart failure ([place])   Allergies as of 01/21/2022       Reactions   Penicillins Other (See Comments)   Unknown         Medication List     STOP taking these medications    docusate sodium 100 MG capsule Commonly known as: COLACE   enoxaparin 40 MG/0.4ML injection Commonly known as: LOVENOX   HYDROcodone-acetaminophen 5-325 MG tablet Commonly known as: NORCO/VICODIN   traZODone 50 MG tablet Commonly known as: DESYREL   Vitamin D (Ergocalciferol) 1.25 MG (50000 UNIT) Caps capsule Commonly known as: DRISDOL       TAKE these medications    aspirin EC 81 MG tablet Take 1 tablet (81 mg total) by mouth daily. Swallow whole. Start taking on: January 22, 2022   carvedilol 3.125 MG tablet Commonly known as: COREG Take 1 tablet (3.125 mg total) by mouth 2 (two) times daily with a meal.   clopidogrel 75 MG tablet Commonly known as: PLAVIX Take 1 tablet (75 mg total) by mouth daily. Start taking on: January 22, 2022   QUEtiapine 25 MG tablet Commonly known as: SEROQUEL Take 1 tablet (25 mg total) by mouth at bedtime.   rosuvastatin 20 MG tablet Commonly known as: CRESTOR Take 1 tablet (20 mg total) by mouth daily. Start taking on: January 22, 2022   sacubitril-valsartan 24-26 MG Commonly known as: ENTRESTO Take 1 tablet by mouth 2 (two) times daily.   senna-docusate  8.6-50 MG tablet Commonly known as: Senokot-S Take 1 tablet by mouth at bedtime as needed for mild constipation or moderate constipation. What changed:  when to take this reasons to take this        LABORATORY STUDIES CBC    Component Value Date/Time   WBC 11.0 (H) 01/21/2022 0328   RBC 5.58 01/21/2022 0328   HGB 17.1 (H) 01/21/2022 0328   HCT 50.0 01/21/2022 0328   PLT 356 01/21/2022 0328   MCV 89.6 01/21/2022 0328   MCH 30.6 01/21/2022 0328   MCHC 34.2 01/21/2022 0328   RDW 13.7 01/21/2022 0328   LYMPHSABS 1.0 01/15/2022 1908   MONOABS 1.1 (H) 01/15/2022 1908   EOSABS 0.0 01/15/2022 1908   BASOSABS 0.1 01/15/2022 1908   CMP    Component Value Date/Time   NA 140 01/21/2022 0328   K 4.4 01/21/2022 0328   CL 119 (H) 01/21/2022 0328   CO2 15 (L) 01/21/2022 0328   GLUCOSE 118 (H) 01/21/2022 0328   BUN 37 (H) 01/21/2022 0328   CREATININE 1.07 01/21/2022 0328   CALCIUM 9.0 01/21/2022 0328   PROT 6.7 01/15/2022 1908   ALBUMIN 3.9 01/15/2022 1908   AST 28 01/15/2022 1908   ALT 15 01/15/2022 1908   ALKPHOS 56 01/15/2022 1908   BILITOT 1.7 (H) 01/15/2022 1908   GFRNONAA >60 01/21/2022 0328   COAGS Lab Results  Component Value

## 2022-01-21 NOTE — TOC Transition Note (Signed)
Transition of Care [facility]) - CM/SW Discharge Note   Patient Details  Name: Casey Rangel MRN: [id number] Date of Birth: Jan 01, 1951  Transition of Care [facility]) CM/SW Contact:  Baldemar Lenis, LCSW Phone Number: 01/21/2022, 2:43 PM   Clinical Narrative:   CSW spoke with patient's sister, [name], to provide bed offers for SNF, and [name] chose [facility]. CSW confirmed with [facility] that bed is available and patient can admit today. CSW sent discharge information, and confirmed with [name], in agreement for transfer today. Transport scheduled with [facility] for next available.  Nurse to call report to [phone], Room 605P.    Final next level of care: Skilled Nursing Facility Barriers to Discharge: Barriers Resolved   Patient Goals and CMS Choice Patient states their goals for this hospitalization and ongoing recovery are:: patient unable to participate in goal setting CMS Medicare.gov Compare Post Acute Care list provided to:: Patient Represenative (must comment) Choice offered to / list presented to : Sibling  Discharge Placement              Patient chooses bed at: [facility] Patient to be transferred to facility by: [facility] Name of family member notified: [name] Patient and family notified of of transfer: 01/21/22  Discharge Plan and Services     Post Acute Care Choice: Skilled Nursing Facility                               Social Determinants of Health (SDOH) Interventions     Readmission Risk Interventions     View : No data to display.

## 2022-01-21 NOTE — Progress Notes (Signed)
STROKE TEAM PROGRESS NOTE   INTERVAL HISTORY No family at the bedside.  He awake but remains severely aphasic and only speaking some single/combinations of words family is agreeable to SNF and awaiting insurance approval and bed availability.  Vital signs stable.  Neurological exam unchanged.    Vitals:   01/21/22 0405 01/21/22 0605 01/21/22 0800 01/21/22 0852  BP: (!) 144/86 125/76 (!) 134/92 132/79  Pulse: 84 74 76 76  Resp: (!) 28 (!) 28 16   Temp: 97.9 F (36.6 C) 98 F (36.7 C) 98 F (36.7 C)   TempSrc: Axillary  Axillary   SpO2: 94% 95% 95%   Weight:      Height:       CBC:  Recent Labs  Lab 01/15/22 1908 01/15/22 1914 01/20/22 0420 01/21/22 0328  WBC 16.3*   < > 11.9* 11.0*  NEUTROABS 13.9*  --   --   --   HGB 16.4   < > 17.8* 17.1*  HCT 49.3   < > 50.6 50.0  MCV 94.6   < > 88.6 89.6  PLT 277   < > 374 356   < > = values in this interval not displayed.   Basic Metabolic Panel:  Recent Labs  Lab 01/20/22 0420 01/21/22 0328  NA 140 140  K 4.2 4.4  CL 112* 119*  CO2 18* 15*  GLUCOSE 107* 118*  BUN 24* 37*  CREATININE 0.98 1.07  CALCIUM 9.0 9.0   Lipid Panel:  Recent Labs  Lab 01/16/22 0449  CHOL 178  TRIG 43  HDL 40*  CHOLHDL 4.5  VLDL 9  LDLCALC 129*   HgbA1c:  Recent Labs  Lab 01/16/22 0449  HGBA1C 5.1   Urine Drug Screen:  Recent Labs  Lab 01/15/22 1908  LABOPIA NONE DETECTED  COCAINSCRNUR NONE DETECTED  LABBENZ NONE DETECTED  AMPHETMU NONE DETECTED  THCU NONE DETECTED  LABBARB NONE DETECTED    Alcohol Level  Recent Labs  Lab 01/15/22 1930  ETH <10    IMAGING past 24 hours No results found.  PHYSICAL EXAM  Physical Exam  Constitutional: Appears well-developed and well-nourished elderly male not in distress.  Cardiovascular: Normal rate and regular rhythm.  Respiratory: Effort normal, breathing unlabored on room air  Neuro: Mental Status: Patient is awake, alert globally aphasic Speaks occasional words but unable to  speak sentences.  Follows simple midline and one-step commands.  States name, location, and able to identify "banana peel" cranial Nerves: II: Visual Fields are full. Pupils are equal, round, and reactive to light.   III,IV, VI: EOMI without ptosis or diploplia.  V: Facial sensation is symmetric to temperature VII: Facial movement is symmetric resting and smiling VIII: Hearing is intact to voice X: Palate elevates symmetrically XI: Shoulder shrug strong on the left XII:  Motor: Tone is normal. Bulk is normal.  RUE 0/5 LUE 5/5 RLE 3/5 LLE 5/5 Sensory: Sensation is symmetric to light touch and temperature in the arms and legs. No extinction to DSS present.  Withdrawal to pain in all extremities Cerebellar: Unable to participate, no ataxia noted in left upper or lower extremity   ASSESSMENT/PLAN [name] is a 71 y.o. male with history of GERD, left hip fracture presenting with global aphasia and right sided weakness and 3 days of increased confusion. Taken for a mechanical thrombectomy and angioplasty. Start DAPT therapy with ASA 81mg  and plavix 75mg  after MRI. Passed MBS. Will recommend cardiology consult for cardiomyopathies, check LE venous Doppler,  and recommend 30-day cardiac event monitor as outpatient. Cardiology consult placed, appreciate recommendations.  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 Cardioembolic source  Code Stroke CT head No acute abnormality. old infarct left parieto-occipital, right PCA, right MCA/ACA  CTA head & neck severe near occlusive stenosis at the Lt ICA terminus with radiographic string sign.  CT perfusion 190 mL perfusion deficit within the left MCA distribution  MRI  patchy watershed distrubution stroke in left MCA territory S/p IR with mechanical thrombectomy and angioplasty of left ICA terminus MRA  The previously demonstrated high-grade stenosis of  the supraclinoid left ICA is no longer appreciated. 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 2D Echo EF 35 to 40% LE venous Doppler no DVT We will consider 30-day CardioNet monitoring as outpatient to rule out A-fib LDL 129 HgbA1c 5.1 VTE prophylaxis -Lovenox No antithrombotic prior to admission, ASA 81mg  and Plavix 75mg  DAPT for 3 weeks and then ASA alone.  Therapy recommendations: CIR Disposition:  pending  Cardiomyopathy 2D echo EF 35 to 40% No prior records to compare Cardiology consulted 6/3  Hypertension BP goal < 180/105 Stable now Long-term BP goal normotensive Off Cleviprex Start norvasc 5mg   Hyperlipidemia Home meds:  None LDL 129, goal < 70 Add crestor 20mg  Continue statin at discharge  Sundowning  Agitation 6/2 evening Needed haldol  IV PRN Put on seroquel 25mg  Qhs  Other Stroke Risk Factors Advanced Age >/= 11   Other Active Problems Dysphagia - now pass swallow, on dys3 and thin SLP continuing to follow to advance diet Acute hypoxic respiratory insufficiency- resolved   Hospital day # 6  Patient remains severely aphasic but is showing slow improvement.  Continue ongoing therapies.  Likely transfer to  SNF later this week when bed available.  Discussed with .  Greater than 50% time during this 35-minute visit was spent in counseling and coordination of care and discussion with care team and answering questions , MD Medical Director 8/2 Stroke Center Pager: [phone] 01/21/2022 12:49 PM    To contact Stroke Continuity provider, please refer to Child psychotherapist. After hours, contact General Neurology

## 2022-01-21 NOTE — Progress Notes (Addendum)
Telemetry reviewed, no atrial arrhythmias.   Please notify cardiology rounding team once discharge plans are finalized so we can order the appropriate monitor.   Thank you  Marcelino Duster, PA-C 01/21/2022, 10:48 AM [phone] [facility] [address] [place], [place] [address]

## 2022-01-21 NOTE — Progress Notes (Addendum)
Physical Therapy Treatment Patient Details Name: Casey Rangel MRN: [id number] DOB: 1951/04/22 Today's Date: 01/21/2022   History of Present Illness 71 yo admitted with acute R sided weakness and aphasia, found down non-responsive by his sister. 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 MRI pending. PMH L hip fx ; T9-11 fusion    PT Comments    Pt making steady gains toward mobility goals, but significantly limited by aphasia and decreased ability to follow direction even with multimodal cues. Emphasis on getting sustained attention, attention drawn to the Right, transitions to EOB with truncal assist and scooting assist, sit to stand safety/technique, pre-gait to progression of gait stability/pattern quality and staying on task.    Recommendations for follow up therapy are one component of a multi-disciplinary discharge planning process, led by the attending physician.  Recommendations may be updated based on patient status, additional functional criteria and insurance authorization.  Follow Up Recommendations  Skilled nursing-short term rehab (<3 hours/day) (sister not sure she will be able to handle the level of care needed post AIR)     Assistance Recommended at Discharge Frequent or constant Supervision/Assistance  Patient can return home with the following A lot of help with walking and/or transfers;A lot of help with bathing/dressing/bathroom;Assistance with cooking/housework;Assist for transportation;Help with stairs or ramp for entrance;Direct supervision/assist for medications management;Direct supervision/assist for financial management   Equipment Recommendations  Other (comment) (TBD)    Recommendations for Other Services Rehab consult     Precautions / Restrictions Precautions Precautions: Fall     Mobility  Bed Mobility Overal bed mobility: Needs Assistance       Supine to sit: Mod assist     General bed mobility comments: multimodal cues for  direction  (VC not sufficient alone),  initiation assist, R side assist for lag behind, up via L elbow and hand over and placement R side to prep for scoot.    Transfers   Equipment used: Rolling walker (2 wheels) Transfers: Sit to/from Stand Sit to Stand: Min assist, +2 safety/equipment                Ambulation/Gait Ambulation/Gait assistance: Mod assist, +2 safety/equipment Gait Distance (Feet): 60 Feet (then 170 feet after propped rest.) Assistive device: Rolling walker (2 wheels) Gait Pattern/deviations: Step-through pattern, Decreased step length - right, Decreased stance time - right, Decreased stride length Gait velocity: decreased Gait velocity interpretation: <1.31 ft/sec, indicative of household ambulator   General Gait Details: paretic gait on the R with weak w/shift and weak R knee control decreasing stance time on R and then weak swing through.   Stairs             Wheelchair Mobility    Modified Rankin (Stroke Patients Only) Modified Rankin (Stroke Patients Only) Modified Rankin: Moderately severe disability     Balance Overall balance assessment: Needs assistance Sitting-balance support: Feet supported Sitting balance-Leahy Scale: Fair     Standing balance support: Bilateral upper extremity supported, During functional activity Standing balance-Leahy Scale: Poor Standing balance comment: UE support and assist for balance                            Cognition Arousal/Alertness: Awake/alert Behavior During Therapy: Flat affect (mild impulsivities) Overall Cognitive Status: Difficult to assess                                 General  Comments: follows gestures given from left, minimal response in yes/no's to simple questions.        Exercises      General Comments        Pertinent Vitals/Pain Pain Assessment Pain Assessment: Faces Faces Pain Scale: No hurt Pain Intervention(s): Monitored during session     Home Living                          Prior Function            PT Goals (current goals can now be found in the care plan section) Acute Rehab PT Goals PT Goal Formulation: With patient Time For Goal Achievement: 01/30/22 Potential to Achieve Goals: Good Progress towards PT goals: Progressing toward goals    Frequency    Min 3X/week      PT Plan Discharge plan needs to be updated;Frequency needs to be updated    Co-evaluation              AM-PAC PT "6 Clicks" Mobility   Outcome Measure  Help needed turning from your back to your side while in a flat bed without using bedrails?: A Lot Help needed moving from lying on your back to sitting on the side of a flat bed without using bedrails?: A Lot Help needed moving to and from a bed to a chair (including a wheelchair)?: A Lot Help needed standing up from a chair using your arms (e.g., wheelchair or bedside chair)?: A Little Help needed to walk in hospital room?: A Lot Help needed climbing 3-5 steps with a railing? : A Lot 6 Click Score: 13    End of Session   Activity Tolerance: Patient limited by fatigue;Patient tolerated treatment well Patient left: in bed;with call bell/phone within reach;with bed alarm set Nurse Communication: Mobility status PT Visit Diagnosis: Unsteadiness on feet (R26.81);Muscle weakness (generalized) (M62.81);Hemiplegia and hemiparesis Hemiplegia - Right/Left: Right Hemiplegia - dominant/non-dominant: Dominant Hemiplegia - caused by: Cerebral infarction     Time: 8756-4332 PT Time Calculation (min) (ACUTE ONLY): 23 min  Charges:  $Gait Training: 8-22 mins $Therapeutic Activity: 8-22 mins                     01/21/2022  Jacinto Halim., PT Acute Rehabilitation Services [phone]  (pager) [phone]  (office)   Eliseo Gum Dao Memmott 01/21/2022, 3:37 PM

## 2022-01-21 NOTE — Progress Notes (Addendum)
STROKE TEAM PROGRESS NOTE   INTERVAL HISTORY Patient is seen in his room with no family at the bedside.  He is mute today.  Plan is for discharge to a SNF soon.   Vitals:   01/21/22 0405 01/21/22 0605 01/21/22 0800 01/21/22 0852  BP: (!) 144/86 125/76 (!) 134/92 132/79  Pulse: 84 74 76 76  Resp: (!) 28 (!) 28 16   Temp: 97.9 F (36.6 C) 98 F (36.7 C) 98 F (36.7 C)   TempSrc: Axillary  Axillary   SpO2: 94% 95% 95%   Weight:      Height:       CBC:  Recent Labs  Lab 01/15/22 1908 01/15/22 1914 01/20/22 0420 01/21/22 0328  WBC 16.3*   < > 11.9* 11.0*  NEUTROABS 13.9*  --   --   --   HGB 16.4   < > 17.8* 17.1*  HCT 49.3   < > 50.6 50.0  MCV 94.6   < > 88.6 89.6  PLT 277   < > 374 356   < > = values in this interval not displayed.    Basic Metabolic Panel:  Recent Labs  Lab 01/20/22 0420 01/21/22 0328  NA 140 140  K 4.2 4.4  CL 112* 119*  CO2 18* 15*  GLUCOSE 107* 118*  BUN 24* 37*  CREATININE 0.98 1.07  CALCIUM 9.0 9.0    Lipid Panel:  Recent Labs  Lab 01/16/22 0449  CHOL 178  TRIG 43  HDL 40*  CHOLHDL 4.5  VLDL 9  LDLCALC 129*    HgbA1c:  Recent Labs  Lab 01/16/22 0449  HGBA1C 5.1    Urine Drug Screen:  Recent Labs  Lab 01/15/22 1908  LABOPIA NONE DETECTED  COCAINSCRNUR NONE DETECTED  LABBENZ NONE DETECTED  AMPHETMU NONE DETECTED  THCU NONE DETECTED  LABBARB NONE DETECTED     Alcohol Level  Recent Labs  Lab 01/15/22 1930  ETH <10     IMAGING past 24 hours No results found.  PHYSICAL EXAM  Physical Exam  Constitutional: Appears well-developed and well-nourished elderly male not in distress.  Respiratory: Effort normal, breathing unlabored on room air  Neuro: Mental Status: Patient is awake, alert globally aphasic Not speaking today cranial Nerves: II: Visual Fields are full. Pupils are equal, round, and reactive to light.   III,IV, VI: EOMI without ptosis or diploplia.  V: Facial sensation is symmetric to  touch VII: Facial movement is symmetric resting and smiling VIII: Hearing is intact to voice XII:  Motor: Tone is normal. Bulk is normal.  RUE 0/5 LUE 5/5 RLE 3/5 LLE 5/5 Sensory: Sensation is symmetric to light touch in the arms and legs. No extinction to DSS present.  Withdrawal to pain in all extremities Cerebellar: Unable to participate, no ataxia noted in left upper or lower extremity   ASSESSMENT/PLAN [name] is a 71 y.o. male with history of GERD, left hip fracture presenting with global aphasia and right sided weakness and 3 days of increased confusion. Taken for a mechanical thrombectomy and angioplasty. Start DAPT therapy with ASA 81mg  and plavix 75mg  after MRI. Passed MBS. Will recommend cardiology consult for cardiomyopathies, check LE venous Doppler, and recommend 30-day cardiac event monitor as outpatient. Cardiology consult placed, appreciate recommendations.  Now awaiting SNF placement.  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 Cardioembolic source  Code Stroke CT head No acute  abnormality. old infarct left parieto-occipital, right PCA, right MCA/ACA  CTA head & neck severe near occlusive stenosis at the Lt ICA terminus with radiographic string sign.  CT perfusion 190 mL perfusion deficit within the left MCA distribution  MRI  patchy watershed distrubution stroke in left MCA territory S/p IR with mechanical thrombectomy and angioplasty of left ICA terminus MRA  The previously demonstrated high-grade stenosis of the supraclinoid left ICA is no longer appreciated. 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 2D Echo EF 35 to 40% LE venous Doppler no DVT We will consider 30-day CardioNet monitoring as outpatient to rule out A-fib LDL 129 HgbA1c 5.1 VTE prophylaxis -Lovenox No antithrombotic prior to admission, ASA 81mg  and Plavix 75mg  DAPT for 3 weeks and  then ASA alone.  Therapy recommendations: CIR Disposition:  pending  Cardiomyopathy 2D echo EF 35 to 40% No prior records to compare Cardiology consulted 6/3  Hypertension BP goal < 180/105 Stable now Long-term BP goal normotensive Off Cleviprex Start norvasc 5mg   Hyperlipidemia Home meds:  None LDL 129, goal < 70 Add crestor 20mg  Continue statin at discharge  Sundowning  Agitation 6/2 evening Needed haldol  IV PRN Put on seroquel 25mg  Qhs  Other Stroke Risk Factors Advanced Age >/= 5   Other Active Problems Dysphagia - now pass swallow, on dys3 and thin SLP continuing to follow to advance diet Acute hypoxic respiratory insufficiency- resolved   Hospital day # 6   E , MSN, AGACNP-BC [facility] See Amion for schedule and pager information 01/21/2022 12:50 PM     To contact Stroke Continuity provider, please refer to 8/2. After hours, contact General Neurology

## 2022-01-27 ENCOUNTER — Other Ambulatory Visit: Payer: Self-pay | Admitting: *Deleted

## 2022-01-27 DIAGNOSIS — I639 Cerebral infarction, unspecified: Secondary | ICD-10-CM

## 2022-02-03 ENCOUNTER — Telehealth: Payer: Self-pay | Admitting: Cardiology

## 2022-02-03 ENCOUNTER — Encounter: Payer: Self-pay | Admitting: *Deleted

## 2022-02-03 ENCOUNTER — Other Ambulatory Visit: Payer: Self-pay | Admitting: Cardiology

## 2022-02-03 DIAGNOSIS — I4891 Unspecified atrial fibrillation: Secondary | ICD-10-CM

## 2022-02-03 DIAGNOSIS — I493 Ventricular premature depolarization: Secondary | ICD-10-CM

## 2022-02-03 DIAGNOSIS — I639 Cerebral infarction, unspecified: Secondary | ICD-10-CM

## 2022-02-03 NOTE — Telephone Encounter (Signed)
Patient called 3x and unable to leave voicemail's   Little Ishikawa, MD  P Cv Div Nl Scheduling Can we schedule this patient a f/u appointment with Dr Antoine Poche or APP in 2-4 weeks?  Thanks, Thayer Ohm

## 2022-02-03 NOTE — Progress Notes (Signed)
Patient ID: Casey Rangel, male   DOB: 1950-11-13, 71 y.o.   MRN: [id number] Patient enrolled for [employer] to ship a 30 day cardiac event monitor to  [facility] in care of  [facility] [address], [address] [place], [place]  [address] ATTN: [place] Poli [phone]  Letter with instructions mailed to same address as above.  Dr. Epifanio Lesches to read.

## 2022-02-11 ENCOUNTER — Ambulatory Visit (INDEPENDENT_AMBULATORY_CARE_PROVIDER_SITE_OTHER): Payer: Self-pay

## 2022-02-11 DIAGNOSIS — I493 Ventricular premature depolarization: Secondary | ICD-10-CM

## 2022-02-11 DIAGNOSIS — I639 Cerebral infarction, unspecified: Secondary | ICD-10-CM

## 2022-02-11 DIAGNOSIS — I4891 Unspecified atrial fibrillation: Secondary | ICD-10-CM

## 2022-03-04 ENCOUNTER — Telehealth: Payer: Self-pay

## 2022-03-04 NOTE — Telephone Encounter (Signed)
Can we get pt in more urgently as a new patent this week, hew NP appt is 7/25? Looks like he might need me to sign off home health orders to get going on.

## 2022-03-04 NOTE — Telephone Encounter (Signed)
Tonya called from [facility] for patient to see if Mort Sawyers will be his PCP while he is receiving Home Health. Call back number [phone].

## 2022-03-05 NOTE — Telephone Encounter (Signed)
Let me know where you would like the patient to go on your schedule and we can call

## 2022-03-06 NOTE — Telephone Encounter (Signed)
Any opening is fine

## 2022-03-10 ENCOUNTER — Other Ambulatory Visit: Payer: Self-pay

## 2022-03-10 ENCOUNTER — Telehealth: Payer: Self-pay

## 2022-03-10 ENCOUNTER — Encounter (HOSPITAL_COMMUNITY): Payer: Self-pay | Admitting: Emergency Medicine

## 2022-03-10 ENCOUNTER — Emergency Department (HOSPITAL_COMMUNITY): Payer: Self-pay

## 2022-03-10 ENCOUNTER — Emergency Department (HOSPITAL_COMMUNITY)
Admission: EM | Admit: 2022-03-10 | Discharge: 2022-03-10 | Disposition: A | Discharge status: Home / Self Care | Payer: Self-pay | Attending: Emergency Medicine | Admitting: Emergency Medicine

## 2022-03-10 DIAGNOSIS — I110 Hypertensive heart disease with heart failure: Secondary | ICD-10-CM | POA: Insufficient documentation

## 2022-03-10 DIAGNOSIS — Z79899 Other long term (current) drug therapy: Secondary | ICD-10-CM | POA: Insufficient documentation

## 2022-03-10 DIAGNOSIS — Z7982 Long term (current) use of aspirin: Secondary | ICD-10-CM | POA: Insufficient documentation

## 2022-03-10 DIAGNOSIS — I509 Heart failure, unspecified: Secondary | ICD-10-CM | POA: Insufficient documentation

## 2022-03-10 DIAGNOSIS — Z7902 Long term (current) use of antithrombotics/antiplatelets: Secondary | ICD-10-CM | POA: Insufficient documentation

## 2022-03-10 DIAGNOSIS — R410 Disorientation, unspecified: Secondary | ICD-10-CM | POA: Insufficient documentation

## 2022-03-10 LAB — CBC WITH DIFFERENTIAL/PLATELET
Abs Immature Granulocytes: 0.02 10*3/uL (ref 0.00–0.07)
Basophils Absolute: 0.1 10*3/uL (ref 0.0–0.1)
Basophils Relative: 1 %
Eosinophils Absolute: 0.6 10*3/uL — ABNORMAL HIGH (ref 0.0–0.5)
Eosinophils Relative: 6 %
HCT: 41.3 % (ref 39.0–52.0)
Hemoglobin: 14.1 g/dL (ref 13.0–17.0)
Immature Granulocytes: 0 %
Lymphocytes Relative: 16 %
Lymphs Abs: 1.4 10*3/uL (ref 0.7–4.0)
MCH: 31.5 pg (ref 26.0–34.0)
MCHC: 34.1 g/dL (ref 30.0–36.0)
MCV: 92.2 fL (ref 80.0–100.0)
Monocytes Absolute: 0.5 10*3/uL (ref 0.1–1.0)
Monocytes Relative: 6 %
Neutro Abs: 6.2 10*3/uL (ref 1.7–7.7)
Neutrophils Relative %: 71 %
Platelets: 278 10*3/uL (ref 150–400)
RBC: 4.48 MIL/uL (ref 4.22–5.81)
RDW: 13.8 % (ref 11.5–15.5)
WBC: 8.9 10*3/uL (ref 4.0–10.5)
nRBC: 0 % (ref 0.0–0.2)

## 2022-03-10 LAB — COMPREHENSIVE METABOLIC PANEL
ALT: 18 U/L (ref 0–44)
AST: 20 U/L (ref 15–41)
Albumin: 3.8 g/dL (ref 3.5–5.0)
Alkaline Phosphatase: 79 U/L (ref 38–126)
Anion gap: 9 (ref 5–15)
BUN: 19 mg/dL (ref 8–23)
CO2: 25 mmol/L (ref 22–32)
Calcium: 9.2 mg/dL (ref 8.9–10.3)
Chloride: 107 mmol/L (ref 98–111)
Creatinine, Ser: 0.83 mg/dL (ref 0.61–1.24)
GFR, Estimated: >60 mL/min (ref >60)
Glucose, Bld: 100 mg/dL — ABNORMAL HIGH (ref 70–99)
Potassium: 4.2 mmol/L (ref 3.5–5.1)
Sodium: 141 mmol/L (ref 135–145)
Total Bilirubin: 1.1 mg/dL (ref 0.3–1.2)
Total Protein: 6.7 g/dL (ref 6.5–8.1)

## 2022-03-10 LAB — I-STAT CHEM 8, ED
BUN: 21 mg/dL (ref 8–23)
Calcium, Ion: 1.15 mmol/L (ref 1.15–1.40)
Chloride: 104 mmol/L (ref 98–111)
Creatinine, Ser: 0.8 mg/dL (ref 0.61–1.24)
Glucose, Bld: 95 mg/dL (ref 70–99)
HCT: 41 % (ref 39.0–52.0)
Hemoglobin: 13.9 g/dL (ref 13.0–17.0)
Potassium: 4.2 mmol/L (ref 3.5–5.1)
Sodium: 140 mmol/L (ref 135–145)
TCO2: 29 mmol/L (ref 22–32)

## 2022-03-10 LAB — I-STAT VENOUS BLOOD GAS, ED
Acid-Base Excess: 1 mmol/L (ref 0.0–2.0)
Bicarbonate: 26.6 mmol/L (ref 20.0–28.0)
Calcium, Ion: 1.18 mmol/L (ref 1.15–1.40)
HCT: 41 % (ref 39.0–52.0)
Hemoglobin: 13.9 g/dL (ref 13.0–17.0)
O2 Saturation: 71 %
Potassium: 4.2 mmol/L (ref 3.5–5.1)
Sodium: 140 mmol/L (ref 135–145)
TCO2: 28 mmol/L (ref 22–32)
pCO2, Ven: 47.3 mmHg (ref 44–60)
pH, Ven: 7.359 (ref 7.25–7.43)
pO2, Ven: 39 mmHg (ref 32–45)

## 2022-03-10 LAB — URINALYSIS, ROUTINE W REFLEX MICROSCOPIC
Bilirubin Urine: NEGATIVE
Glucose, UA: NEGATIVE mg/dL
Hgb urine dipstick: NEGATIVE
Ketones, ur: 5 mg/dL — AB
Leukocytes,Ua: NEGATIVE
Nitrite: NEGATIVE
Protein, ur: NEGATIVE mg/dL
Specific Gravity, Urine: 1.02 (ref 1.005–1.030)
pH: 5 (ref 5.0–8.0)

## 2022-03-10 LAB — AMMONIA: Ammonia: 21 umol/L (ref 9–35)

## 2022-03-10 LAB — CBG MONITORING, ED: Glucose-Capillary: 84 mg/dL (ref 70–99)

## 2022-03-10 LAB — LACTIC ACID, PLASMA: Lactic Acid, Venous: 1 mmol/L (ref 0.5–1.9)

## 2022-03-10 NOTE — ED Notes (Signed)
Patient verbalizes understanding of discharge instructions. Opportunity for questioning and answers were provided. Armband removed by staff, pt discharged from ED.  

## 2022-03-10 NOTE — ED Notes (Signed)
Checked patient cbg it was 73 notified RN of blood sugar helped patient with urine sample patient is now back in bed on the monitor with family at bedside and call bell in reach

## 2022-03-10 NOTE — Telephone Encounter (Signed)
Patient sister Casey Rangel called in stating Casey Rangel wasn't doing to well. She stated that he wasn't talking like normal and that this morning she found him standing in the middle of the street. She stated she was concerned about his wellbeing. He has a new patient appointment tomorrow. Informed her to take him to ER. Please advise. Thank you!

## 2022-03-10 NOTE — ED Triage Notes (Signed)
Patient sent over by PCP for worsening confusion over the last four days. Per patients wife he was outside this AM at [address] and has been doing this for days. No formal diagnosis of dementia/alzheimer's. No complaints of pain. Denies any urinary frequency, painful urination. Patient is Aox3, denies any recent falls.

## 2022-03-10 NOTE — Telephone Encounter (Signed)
Can we call to triage and make sure pt went to ER and or they called 911?

## 2022-03-10 NOTE — Progress Notes (Signed)
CSW spoke with patients sister who stated that [facility] had applied for medicaid for patient but she has not heard anything back. CSW explained to patients sister for patient to go to a memory care that he will need a formal diagnosis for dementia and medicaid or private pay. CSW told patients sister if her brother doesn't qualify for medicaid that she will have to pay privately. CSW stated this could range from 5,000-10,000 a month. Patients sister stated she could not afford that. Patients sister also stated she could not afford for an aide to come into the home. Patients sister stated combined they only have $140 dollars left this month. Patients sister stated sometimes she can't afford food. CSW asked if she had food stamps and she stated she only gets $20 dollars and has had food stamps for 3 months. Patients sister stated she gets SSI and her brother gets $1400 a month for SSI. CSW told patient sister that she would reach out to [facility] to find out the status of the medicaid application. CSW also gave patients sister the meals on wheels number, pace of the triad brochure, and the personal care services form if patient qualifies for medicaid. CSW also included medicaid worker [place], [id number].

## 2022-03-10 NOTE — Discharge Instructions (Addendum)
Your sister brought you to be seen in the emergency department for your confusion. You had no obvious medical cause of your confusion including no signs of infection, dehydration or new stroke. You should follow up with your primary doctor as planned to help establish a home health aid and follow up with your primary doctor or neurology to be evaluated for dementia to help expedite planning for a long term care facility or memory facility. You should return to the emergency department if you have fevers, vomiting, you fall and have an injury or if you have any other new or concerning symptoms.

## 2022-03-10 NOTE — Telephone Encounter (Signed)
I spoke with Casey Rangel pt was seen at [facility] ED today and pt just got home. Pt already has appt with T Dugal FNP 03/11/22 at 9:40 as new pt and Casey Rangel said pt will keep appt but pt does not want to take a bath; explained does not have to take bath to be seen and Casey Rangel voiced understanding and will talk with Casey Pedro FNP about neurology appt at 03/11/22 visit. Sending note to T Dugal FNP and Tresa Endo CMA.

## 2022-03-10 NOTE — ED Provider Triage Note (Signed)
Emergency Medicine Provider Triage Evaluation Note  Casey Rangel , a 70 y.o. male  was evaluated in triage.  Pt complains of patient is here for altered mental status.  Wife gives the history.  Recent stroke with aphasia.  Patient able to tell me his name where he is and what year it is unsure of the month.  Wife states that he has been getting progressively more confused since he has been home from [facility] skilled nursing facility.  He has been pacing back-and-forth.  She states it took him 45 minutes minutes to figure out a make coffee this morning and she found him wandering in the middle of the street staring looking confused this morning.  Patient unable to afford his medications that cost $900 at discharge from [facility] and has not been taking anything..  Review of Systems  Positive: Confusion Negative: Fever  Physical Exam  BP (!) 148/98 (BP Location: Right Arm)   Pulse 86   Temp 98.1 F (36.7 C) (Oral)   Resp 16   Ht 5\' 10"  (1.778 m)   Wt 78 kg   SpO2 98%   BMI 24.67 kg/m  Gen:   Awake, no distress   Resp:  Normal effort  MSK:   Moves extremities without difficulty  Other:  No facial droop  Medical Decision Making  Medically screening exam initiated at 12:23 PM.  Appropriate orders placed.  Casey Rangel was informed that the remainder of the evaluation will be completed by another provider, this initial triage assessment does not replace that evaluation, and the importance of remaining in the ED until their evaluation is complete.  Work-up initiated   Casey Burn, PA-C 03/10/22 1225

## 2022-03-10 NOTE — ED Provider Notes (Signed)
[facility] EMERGENCY DEPARTMENT Provider Note   CSN: [id number] Arrival date & time: 03/10/22  1107     History  Chief Complaint  Patient presents with   Altered Mental Status    Casey Rangel is a 71 y.o. male.  Patient is a 71 year old male with a past medical history of CVA with aphasia and R-sided weakness recently home from rehab going, hypertension, CHF and concern for dementia presenting to the emergency department with his sister for concern for inability to care for him and worsening confusion.  Patient's sister reports that at the rehab facility he was able to bathe himself and do basic tasks for himself but since he has been home he has not bathed himself and cannot make coffee which he normally does for himself.  She states that he has been wandering throughout the night and she found him in the street at 6 AM this morning.  She states that she is unable to afford his medication and has only been giving him his aspirin which she states he has been refusing to take.  She denies any known falls, fever or infectious symptoms.  The patient denies any pain.  She states that they were unable to establish a home health care provider without seeing a primary doctor first after discharge from the rehab but his sister states that she does not feel like she can take care of him at home even with home health during the day.  The history is provided by a relative.  Altered Mental Status Presenting symptoms: behavior changes and confusion   Context: not taking medications as prescribed        Home Medications Prior to Admission medications   Medication Sig Start Date End Date Taking? Authorizing Provider  aspirin EC 81 MG tablet Take 1 tablet (81 mg total) by mouth daily. Swallow whole. 01/22/22   de Saintclair Halsted, Cortney E, NP  carvedilol (COREG) 3.125 MG tablet Take 1 tablet (3.125 mg total) by mouth 2 (two) times daily with a meal. 01/21/22   de Saintclair Halsted, Cortney E, NP   clopidogrel (PLAVIX) 75 MG tablet Take 1 tablet (75 mg total) by mouth daily. 01/22/22   de Saintclair Halsted, Cortney E, NP  QUEtiapine (SEROQUEL) 25 MG tablet Take 1 tablet (25 mg total) by mouth at bedtime. 01/21/22   de Saintclair Halsted, Cortney E, NP  rosuvastatin (CRESTOR) 20 MG tablet Take 1 tablet (20 mg total) by mouth daily. 01/22/22   de Saintclair Halsted, Cortney E, NP  sacubitril-valsartan (ENTRESTO) 24-26 MG Take 1 tablet by mouth 2 (two) times daily. 01/21/22   de Saintclair Halsted, Cortney E, NP  senna-docusate (SENOKOT-S) 8.6-50 MG tablet Take 1 tablet by mouth at bedtime as needed for mild constipation or moderate constipation. 01/21/22   de Saintclair Halsted, Cortney E, NP      Allergies    Penicillins    Review of Systems   Review of Systems  Constitutional:  Negative for appetite change.  Genitourinary:  Negative for dysuria and hematuria.  Psychiatric/Behavioral:  Positive for confusion and sleep disturbance.   All other systems reviewed and are negative.   Physical Exam Updated Vital Signs BP (!) 151/96   Pulse 84   Temp 98.1 F (36.7 C) (Oral)   Resp (!) 24   Ht 5\' 10"  (1.778 m)   Wt 78 kg   SpO2 99%   BMI 24.67 kg/m  Physical Exam Vitals and nursing note reviewed.  Constitutional:  Appearance: Normal appearance.  HENT:     Head: Normocephalic and atraumatic.  Eyes:     Extraocular Movements: Extraocular movements intact.  Cardiovascular:     Rate and Rhythm: Normal rate and regular rhythm.  Pulmonary:     Effort: Pulmonary effort is normal.     Breath sounds: Normal breath sounds.  Abdominal:     General: Abdomen is flat.     Palpations: Abdomen is soft.     Tenderness: There is no abdominal tenderness.  Musculoskeletal:        General: Normal range of motion.     Cervical back: Normal range of motion and neck supple.  Skin:    General: Skin is warm.  Neurological:     Mental Status: He is alert. Mental status is at baseline.  Psychiatric:        Behavior: Behavior normal.     ED  Results / Procedures / Treatments   Labs (all labs ordered are listed, but only abnormal results are displayed) Labs Reviewed  COMPREHENSIVE METABOLIC PANEL - Abnormal; Notable for the following components:      Result Value   Glucose, Bld 100 (*)    All other components within normal limits  CBC WITH DIFFERENTIAL/PLATELET - Abnormal; Notable for the following components:   Eosinophils Absolute 0.6 (*)    All other components within normal limits  URINALYSIS, ROUTINE W REFLEX MICROSCOPIC - Abnormal; Notable for the following components:   Ketones, ur 5 (*)    All other components within normal limits  AMMONIA  LACTIC ACID, PLASMA  CBG MONITORING, ED  CBG MONITORING, ED  I-STAT CHEM 8, ED  I-STAT VENOUS BLOOD GAS, ED    EKG EKG Interpretation  Date/Time:  Monday March 10 2022 11:41:04 EDT Ventricular Rate:  74 PR Interval:  200 QRS Duration: 94 QT Interval:  388 QTC Calculation: 430 R Axis:   -39 Text Interpretation: Normal sinus rhythm Left axis deviation Low voltage QRS Cannot rule out Anterior infarct , age undetermined T wave inversion in lateral leads unchanged No significant change since last tracing Confirmed by Arturo Morton ([id number]) on 03/10/2022 2:40:30 PM  Radiology CT Head Wo Contrast  Result Date: 03/10/2022 CLINICAL DATA:  Altered mental status EXAM: CT HEAD WITHOUT CONTRAST TECHNIQUE: Contiguous axial images were obtained from the base of the skull through the vertex without intravenous contrast. RADIATION DOSE REDUCTION: This exam was performed according to the departmental dose-optimization program which includes automated exposure control, adjustment of the mA and/or kV according to patient size and/or use of iterative reconstruction technique. COMPARISON:  01/15/2022 FINDINGS: Brain: No acute intracranial findings are seen in noncontrast CT brain. There are no signs of bleeding within the cranium. There are multiple old lacunar infarcts in basal ganglia and  periventricular region, more so on the left side. There is old infarct in right occipital lobe. There is encephalomalacia in the right frontal and left parietal lobes. Vascular: Scattered arterial calcifications are seen. Skull: Unremarkable. Sinuses/Orbits: There is mild mucosal thickening in the ethmoid and maxillary sinuses. Other: None. IMPRESSION: No acute intracranial findings are seen. There are multiple infarcts in both cerebral hemispheres. Atrophy. Small vessel disease. Electronically Signed   By: Ernie Avena M.D.   On: 03/10/2022 14:28   DG Chest 2 View  Result Date: 03/10/2022 CLINICAL DATA:  Shortness of breath, altered mental status EXAM: CHEST - 2 VIEW COMPARISON:  Previous studies including the examination of 01/16/2022 FINDINGS: Transverse diameter of Edwyna Shell is increased. Thoracic aorta  is tortuous and ectatic. Lung fields are clear of any infiltrate or pulmonary edema. There is no pleural effusion or pneumothorax. IMPRESSION: Cardiomegaly. There are no signs of pulmonary edema or focal pulmonary consolidation. Electronically Signed   By: Ernie Avena M.D.   On: 03/10/2022 12:56    Procedures Procedures    Medications Ordered in ED Medications - No data to display  ED Course/ Medical Decision Making/ A&P Clinical Course as of 03/10/22 1610  Mon Mar 10, 2022  1436 CTH negative for acute disease, multiple old infarcts visualized. Labs negative for cause of his confusion. UA is pending. Social work consulted and plan to speak with sister (caretaker) at bedside but due to patient's insurance likely will be unable to find long term care today and will need formal dementia diagnosis by neurology or PCP as an outpatient. [VK]  1438 CXR with cardiomegaly in the setting of known CHF [VK]  1506 UA negative for infection [VK]  1529 Remainder of labs within normal range, patient will likely be stable for discharge with outpatient work up for long term care facility. He is pending  SW evaluation for resources. [VK]  1608 SW to bring resources with home health aid information. I spoke with the patient's sister that he needs to follow up with his PCP and possibly neurology for dementia diagnosis and further evaluation to help establish long term care. Sister was amenable with the plan and they were given strict return precautions [VK]  1609 He has follow up scheduled for tomorrow with his PCP [VK]    Clinical Course User Index [VK] Phoebe Sharps, DO                           Medical Decision Making Patient is a 71 year old male with a past medical history of CVA with aphasia and R-sided weakness recently home from rehab going, hypertension, CHF and concern for dementia presenting to the emergency department with his sister for concern for inability to care for him and worsening confusion.  Patient has no external signs of trauma and no reported trauma making ICH or mass effect less likely cause of his confusion.  Patient will be evaluated for infection, electrolyte abnormality, ACS or arrhythmias causes of his confusion.  His confusion may also likely be chronic in the setting of possible dementia or his prior strokes and social work will be consulted for assistance and home health versus SNF placement as his sister is unable to care for him at home.  Amount and/or Complexity of Data Reviewed Independent Historian: caregiver External Data Reviewed: notes.    Details: Reviewed discharge summary after stroke, symptoms of aphasia and right-sided weakness Labs: ordered. Radiology: ordered. ECG/medicine tests: ordered.           Final Clinical Impression(s) / ED Diagnoses Final diagnoses:  Confusion    Rx / DC Orders ED Discharge Orders     None         Phoebe Sharps, DO 03/10/22 1610

## 2022-03-11 ENCOUNTER — Ambulatory Visit (INDEPENDENT_AMBULATORY_CARE_PROVIDER_SITE_OTHER): Payer: Self-pay | Admitting: Family

## 2022-03-11 ENCOUNTER — Telehealth: Payer: Self-pay | Admitting: *Deleted

## 2022-03-11 ENCOUNTER — Encounter: Payer: Self-pay | Admitting: Family

## 2022-03-11 VITALS — BP 138/89 | HR 70 | Temp 98.6°F | Resp 16 | Ht 70.0 in | Wt 170.2 lb

## 2022-03-11 DIAGNOSIS — I10 Essential (primary) hypertension: Secondary | ICD-10-CM

## 2022-03-11 DIAGNOSIS — R4189 Other symptoms and signs involving cognitive functions and awareness: Secondary | ICD-10-CM | POA: Insufficient documentation

## 2022-03-11 DIAGNOSIS — I5041 Acute combined systolic (congestive) and diastolic (congestive) heart failure: Secondary | ICD-10-CM

## 2022-03-11 DIAGNOSIS — E782 Mixed hyperlipidemia: Secondary | ICD-10-CM

## 2022-03-11 DIAGNOSIS — K5900 Constipation, unspecified: Secondary | ICD-10-CM

## 2022-03-11 DIAGNOSIS — I63512 Cerebral infarction due to unspecified occlusion or stenosis of left middle cerebral artery: Secondary | ICD-10-CM

## 2022-03-11 DIAGNOSIS — I639 Cerebral infarction, unspecified: Secondary | ICD-10-CM

## 2022-03-11 MED ORDER — SENNOSIDES-DOCUSATE SODIUM 8.6-50 MG PO TABS: 1.0000 | ORAL_TABLET | Freq: Every evening | ORAL | 1 refills | Status: DC | PRN

## 2022-03-11 NOTE — Assessment & Plan Note (Signed)
Used to be on entresto Reviewed recent echocardiogram suggestive of Heart failure Pt today asymptomatic No edema on lower ext Pt to monitor weight per sister, however this may not be ideal due to her concerns caring for him.  Sending to cardiologist for referral as well.

## 2022-03-11 NOTE — Assessment & Plan Note (Signed)
Pt to work on a low chol diet exercise as tolerated At f/u with repeat lipid panel No longer on crestor, will re-evaluate need for this at f/u labs

## 2022-03-11 NOTE — Assessment & Plan Note (Signed)
Per notes appears to have been on entresto as well as on coreg however poor history from sister as well as pt unable to recall due to cognitive defects. Going to refer again to cardiologist for re-eval   Repeat blood pressure in office was ok at 138/89.

## 2022-03-11 NOTE — Telephone Encounter (Signed)
Thank you for your recommendations. Will see pt today as scheduled.

## 2022-03-11 NOTE — Assessment & Plan Note (Signed)
Due to report of this and poor MMSE exam in office Referring to neurology Also will attempt to start workup /referral for social work coordination of care Sister unable to care for him at home, she states even if she were have to a sitter 24/7 Need other options for care

## 2022-03-11 NOTE — Patient Instructions (Addendum)
A referral was placed today for cardiology.  Please let us know if you have not heard back within 2 weeks about the referral.  A referral was placed today for neurology.  Please let us know if you have not heard back within 2 weeks about the referral.  A referral was placed today community care management. Please let us know if you have not heard back within 2 weeks about the referral.   Due to recent changes in healthcare laws, you may see results of your imaging and/or laboratory studies on MyChart before I have had a chance to review them.  I understand that in some cases there may be results that are confusing or concerning to you. Please understand that not all results are received at the same time and often I may need to interpret multiple results in order to provide you with the best plan of care or course of treatment. Therefore, I ask that you please give me 2 business days to thoroughly review all your results before contacting my office for clarification. Should we see a critical lab result, you will be contacted sooner.   It was a pleasure seeing you today! Please do not hesitate to reach out with any questions and or concerns.  Regards,   Mort Sawyers FNP-C

## 2022-03-11 NOTE — Progress Notes (Signed)
New Patient Office Visit  Subjective:  Patient ID: Qualin Vaziri, male    DOB: May 14, 1951  Age: 71 y.o. MRN: [id number]  CC:  Chief Complaint  Patient presents with   Establish Care    HPI Margaret Steenstra is here to establish care as a new patient.  Prior provider was: has not had for years, has been in [place] at least ten years  Pt is without acute concerns.   Brother living with his sister has been living with her for seven years. Trying to help him with his daily medications but unable to do this. Even prior to stroke sister states he was mixing up words and jumbling words. Not able to make his own meals, dressing himself, but not able to clean himself up after stool without help from sister.   MMSE, total score 12 out of 30     03/11/2022    9:59 AM  MMSE - Mini Mental State Exam  Orientation to time 0  Orientation to Place 5  Registration 1  Recall 0  Language- name 2 objects 2  Language- repeat 0  Language- follow 3 step command 3  Language- read & follow direction 1  Write a sentence 0  Write a sentence-comments unable to write since stroke  Copy design 0   Was in hospital 5/31 for stroke. Sister had found him at home lying on the floor with weakness left arm and leg, but sister states today with residual weakness on his right side from the stroke. Levo positive with hemineglect and right sided paralysis with a high NIH score. Ekg sinus tachy without acute ACS. Labs with normal blood sugar renal function and coags. CBC with 57846 ua without UTI. CTA head with narrowing and possible obstruction. CT perfusion , some area in watershed but with viable brain tissue. MRI with patchy acute ischemic nonhemorrhagic left MCA distribution infarct in watershed. Severe near occlusive stenosis at the left ICA terminus.    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  Was sent to SNF, told to start on plavix 75 mg for secondary stroke prevention x 3 weeks then asa alone.    From notes used to see cardiologist prior ot stroke, was on entresto at one point or another but sister states has not been taking anything other than aspirin. No recent appointments with cardiology. This appears to have been while at the hospital.   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 Recently discharged home from rehab with ongoing htn, CHF and demential. Sister accompanies pt. She is concerned about being able to further provide care for him as he is also experiencing worsening confusion. She has found him wandering throughout the night and found him at 6 am in the street two nights ago.    Ekg in Er July 24, NSR with left axis deviation  Labs negative in ER for cause of confusion  CTH negative for acute disease, there were old infarcts visualized.   Per ER notes, likely will need formal dementia dx by neurology or pcp as outpt CXR with cardiomegaly  UA negative for infection   Past Medical History:  Diagnosis Date   GERD (gastroesophageal reflux disease)    Hiatal hernia    Stroke [facility])     Past Surgical History:  Procedure Laterality Date   INTRAMEDULLARY (IM) NAIL INTERTROCHANTERIC Left 10/17/2020   Procedure: INTRAMEDULLARY (IM) NAIL INTERTROCHANTRIC;  Surgeon: Kennedy Bucker, MD;  Location: [facility] ORS;  Service:  Orthopedics;  Laterality: Left;   IR CT HEAD LTD  01/15/2022   IR PERCUTANEOUS ART THROMBECTOMY/INFUSION INTRACRANIAL INC DIAG ANGIO  01/15/2022   IR PTA INTRACRANIAL  01/15/2022   IR US GUIDE VASC ACCESS RIGHT  01/15/2022   RADIOLOGY WITH ANESTHESIA N/A 01/15/2022   Procedure: RADIOLOGY WITH ANESTHESIA;  Surgeon: Julieanne Cotton, MD;  Location: [facility] OR;  Service: Radiology;  Laterality: N/A;   T9-T11 fusion in 1990      Family History  Problem Relation Age of Onset   Hearing loss Mother    Liver disease Brother    Hearing loss Paternal Grandmother     Social History   Socioeconomic History   Marital status: Single     Spouse name: Not on file   Number of children: Not on file   Years of education: Not on file   Highest education level: Not on file  Occupational History   Not on file  Tobacco Use   Smoking status: Former    Packs/day: 1.50    Years: 30.00    Total pack years: 45.00    Types: Cigarettes    Quit date: 2001    Years since quitting: 22.5   Smokeless tobacco: Never  Substance and Sexual Activity   Alcohol use: Not Currently   Drug use: Never   Sexual activity: Not Currently  Other Topics Concern   Not on file  Social History Narrative   Not on file   Social Determinants of Health   Financial Resource Strain: Not on file  Food Insecurity: Not on file  Transportation Needs: Not on file  Physical Activity: Not on file  Stress: Not on file  Social Connections: Not on file  Intimate Partner Violence: Not on file    Outpatient Medications Prior to Visit  Medication Sig Dispense Refill   aspirin EC 81 MG tablet Take 1 tablet (81 mg total) by mouth daily. Swallow whole. 30 tablet 12   carvedilol (COREG) 3.125 MG tablet Take 1 tablet (3.125 mg total) by mouth 2 (two) times daily with a meal. (Patient not taking: Reported on 03/11/2022) 60 tablet 1   sacubitril-valsartan (ENTRESTO) 24-26 MG Take 1 tablet by mouth 2 (two) times daily. (Patient not taking: Reported on 03/11/2022) 60 tablet 1   clopidogrel (PLAVIX) 75 MG tablet Take 1 tablet (75 mg total) by mouth daily. (Patient not taking: Reported on 03/11/2022) 30 tablet 0   QUEtiapine (SEROQUEL) 25 MG tablet Take 1 tablet (25 mg total) by mouth at bedtime. (Patient not taking: Reported on 03/11/2022) 30 tablet 1   rosuvastatin (CRESTOR) 20 MG tablet Take 1 tablet (20 mg total) by mouth daily. (Patient not taking: Reported on 03/11/2022) 30 tablet 1   senna-docusate (SENOKOT-S) 8.6-50 MG tablet Take 1 tablet by mouth at bedtime as needed for mild constipation or moderate constipation. (Patient not taking: Reported on 03/11/2022) 30 tablet 1    No facility-administered medications prior to visit.    Allergies  Allergen Reactions   Penicillins Other (See Comments)    Unknown        Objective:    Physical Exam Constitutional:      General: He is not in acute distress.    Appearance: Normal appearance. He is normal weight. He is not ill-appearing, toxic-appearing or diaphoretic.  HENT:     Head: Normocephalic.     Right Ear: Tympanic membrane normal. Decreased hearing noted.     Left Ear: Tympanic membrane normal. Decreased hearing noted.  Nose: Nose normal.  Eyes:     Pupils: Pupils are equal, round, and reactive to light.  Cardiovascular:     Rate and Rhythm: Normal rate and regular rhythm.  Pulmonary:     Effort: Pulmonary effort is normal.     Breath sounds: Normal breath sounds.  Abdominal:     General: Abdomen is flat. Bowel sounds are normal.     Palpations: Abdomen is soft.     Tenderness: There is no abdominal tenderness.  Musculoskeletal:        General: Normal range of motion.     Cervical back: Normal range of motion.  Skin:    General: Skin is warm.  Neurological:     General: No focal deficit present.     Mental Status: He is alert and oriented to person, place, and time.     Cranial Nerves: Cranial nerve deficit present. No facial asymmetry (some weakness with smile on right side).     Motor: No weakness.     Coordination: Coordination is intact. Finger-Nose-Finger Test and Heel to [facility] Test normal.     Gait: Gait normal.  Psychiatric:        Mood and Affect: Mood normal.        Speech: Speech normal.        Behavior: Behavior normal. Behavior is cooperative.        Thought Content: Thought content normal.        Cognition and Memory: Cognition is impaired. Memory is impaired. He exhibits impaired recent memory and impaired remote memory.        Judgment: Judgment is inappropriate.       BP 138/89   Pulse 70   Temp 98.6 F (37 C)   Resp 16   Ht 5\' 10"  (1.778 m)   Wt 170 lb 4  oz (77.2 kg)   SpO2 98%   BMI 24.43 kg/m  Wt Readings from Last 3 Encounters:  03/11/22 170 lb 4 oz (77.2 kg)  03/10/22 171 lb 15.3 oz (78 kg)  01/21/22 173 lb 8 oz (78.7 kg)     Health Maintenance Due  Topic Date Due   COVID-19 Vaccine (1) Never done   Hepatitis C Screening  Never done   TETANUS/TDAP  Never done   COLONOSCOPY (Pts 45-40yrs Insurance coverage will need to be confirmed)  Never done   Zoster Vaccines- Shingrix (1 of 2) Never done   Pneumonia Vaccine 41+ Years old (1 - PCV) Never done    There are no preventive care reminders to display for this patient.  No results found for: "TSH" Lab Results  Component Value Date   WBC 8.9 03/10/2022   HGB 13.9 03/10/2022   HGB 13.9 03/10/2022   HCT 41.0 03/10/2022   HCT 41.0 03/10/2022   MCV 92.2 03/10/2022   PLT 278 03/10/2022   Lab Results  Component Value Date   NA 140 03/10/2022   NA 140 03/10/2022   K 4.2 03/10/2022   K 4.2 03/10/2022   CO2 25 03/10/2022   GLUCOSE 95 03/10/2022   BUN 21 03/10/2022   CREATININE 0.80 03/10/2022   BILITOT 1.1 03/10/2022   ALKPHOS 79 03/10/2022   AST 20 03/10/2022   ALT 18 03/10/2022   PROT 6.7 03/10/2022   ALBUMIN 3.8 03/10/2022   CALCIUM 9.2 03/10/2022   ANIONGAP 9 03/10/2022   Lab Results  Component Value Date   CHOL 178 01/16/2022   Lab Results  Component Value Date  HDL 40 (L) 01/16/2022   Lab Results  Component Value Date   LDLCALC 129 (H) 01/16/2022   Lab Results  Component Value Date   TRIG 43 01/16/2022   Lab Results  Component Value Date   CHOLHDL 4.5 01/16/2022   Lab Results  Component Value Date   HGBA1C 5.1 01/16/2022      Assessment & Plan:   Problem List Items Addressed This Visit       Cardiovascular and Mediastinum   Primary hypertension    Per notes appears to have been on entresto as well as on coreg however poor history from sister as well as pt unable to recall due to cognitive defects. Going to refer again to cardiologist  for re-eval   Repeat blood pressure in office was ok at 138/89.       Relevant Orders   Ambulatory referral to Cardiology   [facility]   Acute ischemic left MCA stroke (HCC)    Continue daily asa 81 mg  No longer on plavix due to post prophy stroke x 3 weeks only  Referring to neurology for h/o stroke as well as workup on cognitive decline      Acute combined systolic and diastolic heart failure (HCC)    Used to be on entresto Reviewed recent echocardiogram suggestive of Heart failure Pt today asymptomatic No edema on lower ext Pt to monitor weight per sister, however this may not be ideal due to her concerns caring for him.  Sending to cardiologist for referral as well.       Relevant Orders   Ambulatory referral to Cardiology   [facility]   RESOLVED: Cerebrovascular accident (CVA) (HCC)   Relevant Orders   AMB Referral to [facility]ordin[facility]     Other   Acute cognitive decline - Primary    Due to report of this and poor MMSE exam in office Referring to neurology Also will attempt to start workup /referral for social work coordination of care Sister unable to care for him at home, she states even if she were have to a sitter 24/7 Need other options for care       Relevant Orders   Ambulatory referral to Neurology   AMB Referral to [facility]ordin[facility]   Mixed hyperlipidemia    Pt to work on a low chol diet exercise as tolerated At f/u with repeat lipid panel No longer on crestor, will re-evaluate need for this at f/u labs      Constipation    Refill sennakot as was helpful prior.       Relevant Medications   senna-docusate (SENOKOT-S) 8.6-50 MG tablet    Meds ordered this encounter  Medications   senna-docusate (SENOKOT-S) 8.6-50 MG tablet    Sig: Take 1 tablet by mouth at bedtime as needed for mild constipation or moderate constipation.    Dispense:  30 tablet    Refill:  1     Order Specific Question:   Supervising Provider    Answer:   Ermalene Searing, AMY E [[id number]]    Follow-up: Return in about 1 month (around 04/11/2022) for for follow up on referrals.    Mort Sawyers, FNP

## 2022-03-11 NOTE — Assessment & Plan Note (Signed)
Continue daily asa 81 mg  No longer on plavix due to post prophy stroke x 3 weeks only  Referring to neurology for h/o stroke as well as workup on cognitive decline

## 2022-03-11 NOTE — Assessment & Plan Note (Signed)
Refill sennakot as was helpful prior.

## 2022-03-11 NOTE — Chronic Care Management (AMB) (Signed)
Care Coordination  Note  03/11/2022 Name: Casey Rangel MRN: [id number] DOB: 1950/10/12  Casey Rangel is a 71 y.o. year old male who is a primary care patient of Mort Sawyers, FNP. I reached out to [name] by phone today to offer care coordination services.      Casey Rangel was given information about Care Coordination services today including:  The Care Coordination services include support from the care team which includes your Nurse Coordinator, Clinical Social Worker, or Pharmacist.  The Care Coordination team is here to help remove barriers to the health concerns and goals most important to you. Care Coordination services are voluntary and the patient may decline or stop services at any time by request to their care team member.   Patient agreed to services and verbal consent obtained.   Follow up plan: Telephone appointment with care coordination team member scheduled for: 03/12/2022  Burman Nieves, [facility] Care Coordination Care Guide Direct Dial: [phone]

## 2022-03-12 ENCOUNTER — Ambulatory Visit: Payer: Self-pay | Admitting: *Deleted

## 2022-03-12 ENCOUNTER — Telehealth: Payer: Self-pay | Admitting: Family

## 2022-03-12 NOTE — Telephone Encounter (Signed)
Verbal orders given  

## 2022-03-12 NOTE — Patient Outreach (Signed)
Care Coordination   Initial Visit Note   03/12/2022 Name: Musaab Geddis MRN: [id number] DOB: 10-07-1950  Kymon Logalbo is a 71 y.o. year old male who sees Mort Sawyers, FNP for primary care. I spoke with  [name] 's sister [name] by phone today  What matters to the patients health and wellness today?  In home care needs   Goals Addressed               This Visit's Progress     In home care needs (pt-stated)        Care Coordination Interventions:  Confirmed that patient's sister is his primary caregiver and she is requesting asistance with patient's care Confirmed concern for patient's safety due to his lack of concern in relations to his mobility(will not use assistive devices) wanders outside of the home, and will not allow caregiver to manage his finances Caregiver confirms that she has no access to his account to ensure bills are paid and he has refused to allow this-she has a legal papers drawn up in the past but patient refuses to provide consent Caregiver confirmed referrals for Neurology and Cardiology Medicaid application started at [facility], caregiver contact Medicaid worker yesterday and is awaiting a return call -Carie Caddy [phone] Caregiver confirmed that she will also follow back up with Meals on Wheels Active listening / Reflection utilized  Emotional Support Provided Caregiver stress acknowledged  Consideration of in-home help encouraged : options discussed Recommended continued follow up with status of the Medicaid application as well as Meals on Wheels-patient can be referred for Personal Care Services if Medicaid is approved CSW to request the possibility of [employer] services with patient's provider         SDOH assessments and interventions completed:   Yes   Care Coordination Interventions Activated:  Yes Care Coordination Interventions:  Yes, provided  Follow up plan: Follow up call scheduled for 03/25/22  Encounter Outcome:  Pt.  Visit Completed

## 2022-03-12 NOTE — Telephone Encounter (Signed)
Home Health verbal orders Caller Name:Tonya Agency Name: [facility]  Callback number: [phone]  Requesting OT/PT/Skilled nursing/Social Work/Speech:PT/OT  Reason:  Frequency: Start of care to begin on 03/15/2022  Please forward to [facility] pool or providers CMA

## 2022-03-12 NOTE — Patient Instructions (Signed)
Visit Information  Thank you for taking time to visit with me today. Please don't hesitate to contact me if I can be of assistance to you.   Following are the goals we discussed today:   Goals Addressed               This Visit's Progress     In home care needs (pt-stated)        Care Coordination Interventions:  Confirmed that patient's sister is his primary caregiver and she is requesting asistance with patient's care Confirmed concern for patient's safety due to his lack of concern in relations to his mobility(will not use assistive devices) wanders outside of the home, and will not allow caregiver to manage his finances Caregiver confirms that she has no access to his account to ensure bills are paid and he has refused to allow this-she has a legal papers drawn up in the past but patient refuses to provide consent Caregiver confirmed referrals for Neurology and Cardiology Medicaid application started at [facility], caregiver contact Medicaid worker yesterday and is awaiting a return call -Carie Caddy [phone] Caregiver confirmed that she will also follow back up with Meals on Wheels Active listening / Reflection utilized  Emotional Support Provided Caregiver stress acknowledged  Consideration of in-home help encouraged : options discussed Recommended continued follow up with status of the Medicaid application as well as Meals on Wheels-patient can be referred for Personal Care Services if Medicaid is approved CSW to request the possibility of [facility] services with patient's provider         Our next appointment is by telephone on 03/24/22 at 10:30am  Please call the care guide team at [phone] if you need to cancel or reschedule your appointment.   If you are experiencing a Mental Health or Behavioral Health Crisis or need someone to talk to, please call the Suicide and Crisis Lifeline: 988   Patient verbalizes understanding of instructions and care plan provided  today and agrees to view in MyChart. Active MyChart status and patient understanding of how to access instructions and care plan via MyChart confirmed with patient.     Telephone follow up appointment with care management team member scheduled for: 03/24/22  Adriana Reams [facility] Care Management [phone]

## 2022-03-17 ENCOUNTER — Telehealth: Payer: Self-pay

## 2022-03-17 NOTE — Telephone Encounter (Signed)
Home Health verbal orders  Agency Name: [facility]   Requesting OT/PT and Skilled nursing  Frequency: Nursing Frequency:  [phone] / Once every other week for 5 weeks  Comments: Patient does not have any of the medications due to cost, is aware he is working with Mardella Layman about that but the only medication he has is the Asprin.   Please forward to [facility] pool or providers CMA

## 2022-03-18 ENCOUNTER — Telehealth: Payer: Self-pay | Admitting: Family

## 2022-03-18 NOTE — Telephone Encounter (Signed)
HH ORDERS   Caller Name: [facility] Agency Name: [facility] Phone #: [phone] secure  Service Requested: OT (examples: OT/PT/Skilled Nursing/Social Work/Speech Therapy/Wound Care)  Frequency of Visits: 1 week 4

## 2022-03-19 NOTE — Telephone Encounter (Signed)
Verbal orders given  

## 2022-03-19 NOTE — Telephone Encounter (Signed)
Ok for verbal orders ?

## 2022-03-20 ENCOUNTER — Telehealth: Payer: Self-pay | Admitting: Family

## 2022-03-20 ENCOUNTER — Ambulatory Visit (INDEPENDENT_AMBULATORY_CARE_PROVIDER_SITE_OTHER): Payer: Self-pay | Admitting: Physician Assistant

## 2022-03-20 ENCOUNTER — Encounter: Payer: Self-pay | Admitting: Physician Assistant

## 2022-03-20 VITALS — BP 145/89 | HR 74 | Resp 18 | Wt 168.0 lb

## 2022-03-20 DIAGNOSIS — Z91148 Patient's other noncompliance with medication regimen for other reason: Secondary | ICD-10-CM

## 2022-03-20 DIAGNOSIS — I63512 Cerebral infarction due to unspecified occlusion or stenosis of left middle cerebral artery: Secondary | ICD-10-CM

## 2022-03-20 DIAGNOSIS — Z599 Problem related to housing and economic circumstances, unspecified: Secondary | ICD-10-CM

## 2022-03-20 DIAGNOSIS — F01518 Vascular dementia, unspecified severity, with other behavioral disturbance: Secondary | ICD-10-CM

## 2022-03-20 DIAGNOSIS — F0150 Vascular dementia without behavioral disturbance: Secondary | ICD-10-CM | POA: Insufficient documentation

## 2022-03-20 MED ORDER — DONEPEZIL HCL 10 MG PO TABS: ORAL_TABLET | ORAL | 3 refills | Status: DC

## 2022-03-20 NOTE — Patient Instructions (Addendum)
It was a pleasure to see you today at our office.   Recommendations:  Follow up in 3  months Start Donepezil Take half tablet (5 mg) daily for 2 weeks, then increase to the full tablet at 10 mg daily   Recommend Speech therapy for vascular dementia     Whom to call:  Memory  decline, memory medications: Call our office [phone]   For psychiatric meds, mood meds: Please have your primary care physician manage these medications.   Counseling regarding caregiver distress, including caregiver depression, anxiety and issues regarding community resources, adult day care programs, adult living facilities, or memory care questions:   Feel free to contact Misty Lisabeth Register, Social Worker at [phone]   For assessment of decision of mental capacity and competency:  Call Dr. Erick Blinks, geriatric psychiatrist at [phone]  For guidance in geriatric dementia issues please call [facility] [phone]  For guidance regarding [facility] and if placement were needed at the facility, contact Sidney Ace, Social Worker tel: [phone]  If you have any severe symptoms of a stroke, or other severe issues such as confusion,severe chills or fever, etc call 911 or go to the ER as you may need to be evaluated further     RECOMMENDATIONS FOR ALL PATIENTS WITH MEMORY PROBLEMS: 1. Continue to exercise (Recommend 30 minutes of walking everyday, or 3 hours every week) 2. Increase social interactions - continue going to [place] and enjoy social gatherings with friends and family 3. Eat healthy, avoid fried foods and eat more fruits and vegetables 4. Maintain adequate blood pressure, blood sugar, and blood cholesterol level. Reducing the risk of stroke and cardiovascular disease also helps promoting better memory. 5. Avoid stressful situations. Live a simple life and avoid aggravations. Organize your time and prepare for the next day in anticipation. 6.  Sleep well, avoid any interruptions of sleep and avoid any distractions in the bedroom that may interfere with adequate sleep quality 7. Avoid sugar, avoid sweets as there is a strong link between excessive sugar intake, diabetes, and cognitive impairment We discussed the Mediterranean diet, which has been shown to help patients reduce the risk of progressive memory disorders and reduces cardiovascular risk. This includes eating fish, eat fruits and green leafy vegetables, nuts like almonds and hazelnuts, walnuts, and also use olive oil. Avoid fast foods and fried foods as much as possible. Avoid sweets and sugar as sugar use has been linked to worsening of memory function.  There is always a concern of gradual progression of memory problems. If this is the case, then we may need to adjust level of care according to patient needs. Support, both to the patient and caregiver, should then be put into place.    The Alzheimer's Association is here all day, every day for people facing Alzheimer's disease through our free 24/7 Helpline: 6122404007. The Helpline provides reliable information and support to all those who need assistance, such as individuals living with memory loss, Alzheimer's or other dementia, caregivers, health care professionals and the public.  Our highly trained and knowledgeable staff can help you with: Understanding memory loss, dementia and Alzheimer's  Medications and other treatment options  General information about aging and brain health  Skills to provide quality care and to find the best care from professionals  Legal, financial and living-arrangement decisions Our Helpline also features: Confidential care consultation provided by master's level clinicians who can help with decision-making support, crisis assistance and education on issues families  face every day  Help in a caller's preferred language using our translation service that features more than 200 languages and  dialects  Referrals to local community programs, services and ongoing support     FALL PRECAUTIONS: Be cautious when walking. Scan the area for obstacles that may increase the risk of trips and falls. When getting up in the mornings, sit up at the edge of the bed for a few minutes before getting out of bed. Consider elevating the bed at the head end to avoid drop of blood pressure when getting up. Walk always in a well-lit room (use night lights in the walls). Avoid area rugs or power cords from appliances in the middle of the walkways. Use a walker or a cane if necessary and consider physical therapy for balance exercise. Get your eyesight checked regularly.  FINANCIAL OVERSIGHT: Supervision, especially oversight when making financial decisions or transactions is also recommended.  HOME SAFETY: Consider the safety of the kitchen when operating appliances like stoves, microwave oven, and blender. Consider having supervision and share cooking responsibilities until no longer able to participate in those. Accidents with firearms and other hazards in the house should be identified and addressed as well.   ABILITY TO BE LEFT ALONE: If patient is unable to contact 911 operator, consider using LifeLine, or when the need is there, arrange for someone to stay with patients. Smoking is a fire hazard, consider supervision or cessation. Risk of wandering should be assessed by caregiver and if detected at any point, supervision and safe proof recommendations should be instituted.  MEDICATION SUPERVISION: Inability to self-administer medication needs to be constantly addressed. Implement a mechanism to ensure safe administration of the medications.   DRIVING: Regarding driving, in patients with progressive memory problems, driving will be impaired. We advise to have someone else do the driving if trouble finding directions or if minor accidents are reported. Independent driving assessment is available to  determine safety of driving.   If you are interested in the driving assessment, you can contact the following:  [employer] in [place] [phone]  [facility] [phone]  [facility] [phone] or [phone]      Mediterranean Diet A Mediterranean diet refers to food and lifestyle choices that are based on the traditions of countries located on the [employer]. This way of eating has been shown to help prevent certain conditions and improve outcomes for people who have chronic diseases, like kidney disease and heart disease. What are tips for following this plan? Lifestyle  Cook and eat meals together with your family, when possible. Drink enough fluid to keep your urine clear or pale yellow. Be physically active every day. This includes: Aerobic exercise like running or swimming. Leisure activities like gardening, walking, or housework. Get 7-8 hours of sleep each night. If recommended by your health care provider, drink red wine in moderation. This means 1 glass a day for nonpregnant women and 2 glasses a day for men. A glass of wine equals 5 oz (150 mL). Reading food labels  Check the serving size of packaged foods. For foods such as rice and pasta, the serving size refers to the amount of cooked product, not dry. Check the total fat in packaged foods. Avoid foods that have saturated fat or trans fats. Check the ingredients list for added sugars, such as corn syrup. Shopping  At the grocery store, buy most of your food from the areas near the walls of  the store. This includes: Fresh fruits and vegetables (produce). Grains, beans, nuts, and seeds. Some of these may be available in unpackaged forms or large amounts (in bulk). Fresh seafood. Poultry and eggs. Low-fat dairy products. Buy whole ingredients instead of prepackaged foods. Buy fresh fruits and vegetables in-season from local  farmers markets. Buy frozen fruits and vegetables in resealable bags. If you do not have access to quality fresh seafood, buy precooked frozen shrimp or canned fish, such as tuna, salmon, or sardines. Buy small amounts of raw or cooked vegetables, salads, or olives from the deli or salad bar at your store. Stock your pantry so you always have certain foods on hand, such as olive oil, canned tuna, canned tomatoes, rice, pasta, and beans. Cooking  Cook foods with extra-virgin olive oil instead of using butter or other vegetable oils. Have meat as a side dish, and have vegetables or grains as your main dish. This means having meat in small portions or adding small amounts of meat to foods like pasta or stew. Use beans or vegetables instead of meat in common dishes like chili or lasagna. Experiment with different cooking methods. Try roasting or broiling vegetables instead of steaming or sauteing them. Add frozen vegetables to soups, stews, pasta, or rice. Add nuts or seeds for added healthy fat at each meal. You can add these to yogurt, salads, or vegetable dishes. Marinate fish or vegetables using olive oil, lemon juice, garlic, and fresh herbs. Meal planning  Plan to eat 1 vegetarian meal one day each week. Try to work up to 2 vegetarian meals, if possible. Eat seafood 2 or more times a week. Have healthy snacks readily available, such as: Vegetable sticks with hummus. Greek yogurt. Fruit and nut trail mix. Eat balanced meals throughout the week. This includes: Fruit: 2-3 servings a day Vegetables: 4-5 servings a day Low-fat dairy: 2 servings a day Fish, poultry, or lean meat: 1 serving a day Beans and legumes: 2 or more servings a week Nuts and seeds: 1-2 servings a day Whole grains: 6-8 servings a day Extra-virgin olive oil: 3-4 servings a day Limit red meat and sweets to only a few servings a month What are my food choices? Mediterranean diet Recommended Grains: Whole-grain pasta.  Brown rice. Bulgar wheat. Polenta. Couscous. Whole-wheat bread. [place]. Vegetables: Artichokes. Beets. Broccoli. Cabbage. Carrots. Eggplant. Green beans. Chard. Kale. Spinach. Onions. Leeks. Peas. Squash. Tomatoes. Peppers. Radishes. Fruits: Apples. Apricots. Avocado. Berries. Bananas. Cherries. Dates. Figs. Grapes. Lemons. Melon. Oranges. Peaches. Plums. Pomegranate. Meats and other protein foods: Beans. Almonds. Sunflower seeds. Pine nuts. Peanuts. [place]. Salmon. Scallops. Shrimp. [place]. Tilapia. Clams. Oysters. Eggs. Dairy: Low-fat milk. Cheese. Greek yogurt. Beverages: Water. Red wine. Herbal tea. Fats and oils: Extra virgin olive oil. Avocado oil. Grape seed oil. Sweets and desserts: [place] yogurt with honey. Baked apples. Poached pears. Trail mix. Seasoning and other foods: Basil. Cilantro. Coriander. Cumin. Mint. Parsley. Sage. Rosemary. Tarragon. Garlic. Oregano. Thyme. Pepper. Balsalmic vinegar. Tahini. Hummus. Tomato sauce. Olives. Mushrooms. Limit these Grains: Prepackaged pasta or rice dishes. Prepackaged cereal with added sugar. Vegetables: Deep fried potatoes (french fries). Fruits: Fruit canned in syrup. Meats and other protein foods: Beef. Pork. Lamb. Poultry with skin. Hot dogs. Bernie[place] Salines. Dairy: Ice cream. Sour cream. Whole milk. Beverages: Juice. Sugar-sweetened soft drinks. Beer. Liquor and spirits. Fats and oils: Butter. Canola oil. Vegetable oil. Beef fat (tallow). Lard. Sweets and desserts: Cookies. Cakes. Pies. Candy. Seasoning and other foods: Mayonnaise. Premade sauces and marinades. The items listed may  not be a complete list. Talk with your dietitian about what dietary choices are right for you. Summary The Mediterranean diet includes both food and lifestyle choices. Eat a variety of fresh fruits and vegetables, beans, nuts, seeds, and whole grains. Limit the amount of red meat and sweets that you eat. Talk with your health care provider about whether it is safe  for you to drink red wine in moderation. This means 1 glass a day for nonpregnant women and 2 glasses a day for men. A glass of wine equals 5 oz (150 mL). This information is not intended to replace advice given to you by your health care provider. Make sure you discuss any questions you have with your health care provider. Document Released: 03/27/2016 Document Revised: 04/29/2016 Document Reviewed: 03/27/2016 Elsevier Interactive Patient Education  2017 [employer].

## 2022-03-20 NOTE — Telephone Encounter (Signed)
Do you have any ideas who I would send this to?

## 2022-03-20 NOTE — Telephone Encounter (Signed)
Patient sister called in and stated that Casey Rangel's neurologist suggested that we may have some samples of plavix that can be given to him,because they are out. She would like to know if we have any? She would like a phone call.

## 2022-03-20 NOTE — Telephone Encounter (Signed)
Home Health verbal orders Caller Name:Monique Singletary Agency Name: [facility]  Callback number: [phone]  Requesting OT/PT/Skilled nursing/Social Work/Speech:PT  Reason:Home safety Home exercise program Strengthening gate Balance Training  Frequency:1 x a week for 1 wk 2 x week for 3 weeks 1 x a week for 2 weeks  Please forward to [facility] pool or providers CMA

## 2022-03-20 NOTE — Telephone Encounter (Signed)
Verbal orders given  

## 2022-03-20 NOTE — Progress Notes (Addendum)
Assessment/Plan:    The patient is seen in neurologic consultation at the request of Mort Sawyers, FNP for the evaluation of memory.  Casey Rangel is a very pleasant 71 y.o. 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  He underwent thrombectomy for left ICA occlusion and placed on Plavix 75 mg for 3 weeks, then aspirin alone.  He was recently discharged home from rehab which seemed to help with strength and balance.  Seen today for evaluation of memory loss.  MMSE today is 11/30 with delayed recall 0.  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  Vascular and Alzheimer's dementia Recommend memory care versus ALF facility for safety.  Due to financial hardship, spoke with the patient's sister, who is going to talk to [facility] financial services to determine what help she can obtain.  They have no insurance at this time.  Also, she has given information about our Child psychotherapist to discuss these issues. Continue aspirin, Plavix, Crestor, and antihypertensive  Start donepezil 10 mg, take half tab daily for 2 weeks then increase to 1 daily if tolerated. Referral to Speech Therapy continue Seroquel 25 mg nightly as per PCP  Subjective:    The patient is accompanied by his sister who supplements the history.    How long did patient have memory difficulties?  His sister reports that he had memory issues for several years.  She reports that he had difficulty recalling words, but recently "he could not get the words out ".  Also, she reports that "his response will be totally off the wall".  He also began to wander off and get lost in the neighborhood while driving.   He could not follow commands.  She also noticed some apathy.  She states that both short-term memory and long-term memory are affected.   Patient lives with: History stating we did set [place].  repeats oneself?  Endorsed.  He could not remember this appointment, asked his sister several times about where they were going.  Disoriented when walking into a room? Endorsed.  He does not seem to be aware of his surroundings, sometimes he does not recognize where he is. Leaving objects in unusual places?  Patient denies   Ambulates  with difficulty?   Patient denies   Recent falls?  Patient denies   Any head injuries?  Patient denies   History of seizures?   Patient denies   Wandering behavior?  His sister reports that the patient was found wandering through the night in the street on 03/09/2022. Patient drives?   Patient no longer drives Any mood changes such irritability agitation?  Patient denies   Any history of depression?:  Patient denies   Hallucinations?  Patient denies   Paranoia?  Patient denies   Patient reports that he sleeps well without apparent vivid dreams, REM behavior or sleepwalking    History of sleep apnea?  Patient denies   Any hygiene concerns?  Patient is unable to clean himself after stool and needs help from his sister. He does not want to shower for the last 3 weeks and his sister has to assist in giving him sponge baths. Independent of bathing and dressing?  Unable to  dress himself, needs assist.  He is very disinhibited lately, he may be found walking naked around the house. Does the patient needs help with medications?  Is in charge of the medication.  However, his sister is trying to help him and he refuses.  "It is a constant struggle ". Who is in charge of the finances?  Patient is in charge   Any changes in appetite?  Patient denies   Patient have trouble swallowing? Patient denies   Does the patient cook?  He is unable to make his own meals.   Any kitchen  accidents such as leaving the stove on? No longer cooks because he burned the pot that was empty  Any headaches?  Patient denies   Double vision? Patient denies   Any focal numbness or tingling?  Patient denies   Chronic back pain Patient denies   Unilateral weakness?  Patient denies   Any tremors?  Patient denies   Any history of anosmia?  Patient denies   Any incontinence of urine?  Patient denies   Any bowel dysfunction?   Constipation and uses stool softener  History of heavy alcohol intake?  Patient denies   History of heavy tobacco use?  Patient denies   Family history of dementia?  Maternal grandmother had Alzheimer's disease   Patient is retired.  He was working in TEFL teacher.  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    Allergies  Allergen Reactions   Penicillins Other (See Comments)    Unknown     Current Outpatient Medications  Medication Instructions   aspirin EC 81 mg, Oral, Daily, Swallow whole.   carvedilol (COREG) 3.125 mg, Oral, 2 times daily with meals   donepezil (ARICEPT) 10 MG tablet Take half tablet (5 mg) daily for 2 weeks, then increase to the full tablet at 10 mg daily   sacubitril-valsartan (ENTRESTO) 24-26 MG 1 tablet, Oral, 2 times daily   senna-docusate (SENOKOT-S) 8.6-50 MG tablet 1 tablet, Oral, At bedtime PRN     VITALS:   Vitals:   03/20/22 1342  BP: (!) 145/89  Pulse: 74  Resp: 18  SpO2: 98%  Weight: 168 lb (76.2 kg)       No data to display          PHYSICAL EXAM   HEENT:  Normocephalic, atraumatic. The mucous membranes are moist. The superficial temporal arteries are without ropiness or tenderness. Cardiovascular: Regular rate and rhythm. Lungs: Clear to auscultation bilaterally. Neck: There are no carotid bruits noted bilaterally.  NEUROLOGICAL:     No data to display             03/23/2022    9:00 AM  03/11/2022    9:59 AM  MMSE - Mini Mental State Exam  Orientation to time 1 0  Orientation to Place 3 5  Registration 1 1  Attention/ Calculation 0   Recall 0 0  Language- name 2 objects 2 2  Language- repeat 0 0  Language- follow 3 step command 3 3  Language- read & follow direction 1 1  Write a sentence 0 0  Write a sentence-comments  unable to write since stroke  Copy design 0 0  Total score 11      Orientation:  Alert and oriented to person, place and time. No aphasia or dysarthria. Fund of knowledge is reduced.  Recent and remote memory impaired.  Attention and concentration are reduced l.  Able to name objects and unable to repeat phrases.  Delayed recall 0  cranial nerves: There is good facial symmetry. Extraocular muscles are intact and visual fields are full to confrontational testing. Speech is fluent and clear. Soft palate rises symmetrically and there is no tongue deviation. Hearing is intact to conversational tone. Tone: Tone is good throughout. Sensation: Sensation is intact to light touch and pinprick throughout. Vibration is intact at the bilateral big toe.There is no extinction with double simultaneous stimulation. There is no sensory dermatomal level identified. Coordination: The patient has no difficulty with RAM's or FNF bilaterally. Normal finger to nose  Motor: Strength is 5/5 in the bilateral upper and left lower extremity.  Right lower extremities 4/5. There is no pronator drift. There are no fasciculations noted. DTR's: Deep tendon reflexes are 2/4 at the bilateral biceps, triceps, brachioradialis, patella and achilles.  Plantar responses are downgoing bilaterally. Gait and Station: The patient is able to ambulate without difficulty.The patient is able to heel toe walk without any difficulty.The patient is able to ambulate in a tandem fashion. The patient is able to stand in the Romberg position.     Thank you for allowing [place] the opportunity to participate in the  care of this nice patient. Please do not hesitate to contact us for any questions or concerns.   Total time spent on today's visit was 61 minutes dedicated to this patient today, preparing to see patient, examining the patient, ordering tests and/or medications and counseling the patient, documenting clinical information in the EHR or other health record, independently interpreting results and communicating results to the patient/family, discussing treatment and goals, answering patient's questions and coordinating care.  Cc:  Mort Sawyers, FNP  Marlowe Kays 03/23/2022 11:12 AM

## 2022-03-20 NOTE — Telephone Encounter (Signed)
Please advise sorry we do not have any samples however it is 15$ for 30 days supply at [facility] if she wants it sent there.   Also, I have referred them to community care management. They should get a call soon, it is to help with financial burden and give them resources so they can hopefully better afford their medications.

## 2022-03-21 ENCOUNTER — Telehealth: Payer: Self-pay | Admitting: Family

## 2022-03-21 ENCOUNTER — Telehealth: Payer: Self-pay | Admitting: *Deleted

## 2022-03-21 NOTE — Telephone Encounter (Signed)
Telephone encounter was:  Successful.  03/21/2022 Name: Casey Rangel MRN: [id number] DOB: May 16, 1951  Janssen Puls is a 71 y.o. year old male who is a primary care patient of Mort Sawyers, FNP . The community resource team was consulted for assistance with Transportation Needs , Food Insecurity, and Caregiver Stress  Care guide performed the following interventions: Patient provided with information about care guide support team and interviewed to confirm resource needs Follow up call placed to the patient to discuss status of referral.  Follow Up Plan:  No further follow up planned at this time. The patient has been provided with needed resources.  Yehuda Mao Greenauer -[facility] [place], Population Health [phone] [address] [place] , [place] [address] Email : [email or web address]

## 2022-03-21 NOTE — Telephone Encounter (Signed)
If pt saw neurology and they recommended speech therapy typically they would also place the referral

## 2022-03-21 NOTE — Telephone Encounter (Signed)
Spoke to Pt sister and she said that she wanted to talk to community care first. Has an appt 8/7 and would give [place] a call back next week to let us know if she wants the RX sent into [facility].

## 2022-03-21 NOTE — Telephone Encounter (Signed)
Patients sister called in and stated that his neurologist recommended speech therapy for him, and would like to know if he could get an order for in home speech therapy instead.

## 2022-03-21 NOTE — Telephone Encounter (Signed)
Called and informed pt sister about this.

## 2022-03-24 ENCOUNTER — Ambulatory Visit: Payer: Self-pay | Admitting: *Deleted

## 2022-03-24 NOTE — Patient Instructions (Signed)
Visit Information  Thank you for taking time to visit with me today. Please don't hesitate to contact me if I can be of assistance to you.   Following are the goals we discussed today:   Goals Addressed               This Visit's Progress     In home care needs (pt-stated)        Care Coordination Interventions:  Confirmed that patient's sister is his primary caregiver and continues to  request asistance with patient's care Concern for patient's safety continues due to his lack of concern in relations to his mobility(will not use assistive devices) wanders outside of the home, removing dead bolt from door, patient's caregiver continues to have difficulty managing patient's finances Patient's neighbor was able to repair dead bolt Caregiver confirms that she has no access to his account to ensure bills are paid and he has refused to allow this-she was provided with a website with links to attorney's that could possibly assist with this Caregiver confirmed referrals for Neurology and Cardiology-patient prescribed medication however has not started due to cost-patient does not have medicare part B Medicaid application completed at  [facility],  [place] worker-[name] [phone] based on patient's income, he is likely to  be approved. Once approved, personal care services can be applied for as well as facility care if needed down the line Caregiver confirmed that patient is now on the list for Meals on Wheels Active listening / Reflection utilized  Emotional Support Provided Caregiver stress acknowledged  Caregiver confirmed that [facility] services will be started for patient         Our next appointment is by telephone on 04/24/22 at 10:30am  Please call the care guide team at [phone] if you need to cancel or reschedule your appointment.   If you are experiencing a Mental Health or Behavioral Health Crisis or need someone to talk to, please call the Suicide and Crisis  Lifeline: 988   Patient verbalizes understanding of instructions and care plan provided today and agrees to view in MyChart. Active MyChart status and patient understanding of how to access instructions and care plan via MyChart confirmed with patient.     Telephone follow up appointment with care management team member scheduled for: 04/24/22  Adriana Reams [facility] Care Management [phone]

## 2022-03-24 NOTE — Patient Outreach (Signed)
Care Coordination   Follow Up Visit Note   03/24/2022 Name: Casey Rangel MRN: [id number] DOB: 14-Nov-1950  Casey Rangel is a 71 y.o. year old male who sees Mort Sawyers, FNP for primary care. I spoke with  [name] by phone today  What matters to the patients health and wellness today?  In home care needs    Goals Addressed               This Visit's Progress     In home care needs (pt-stated)        Care Coordination Interventions:  Confirmed that patient's sister is his primary caregiver and continues to  request asistance with patient's care Concern for patient's safety continues due to his lack of concern in relations to his mobility(will not use assistive devices) wanders outside of the home, removing dead bolt from door, patient's caregiver continues to have difficulty managing patient's finances Patient's neighbor was able to repair dead bolt Caregiver confirms that she has no access to his account to ensure bills are paid and he has refused to allow this-she was provided with a website with links to attorney's that could possibly assist with this Caregiver confirmed referrals for Neurology and Cardiology-patient prescribed medication however has not started due to cost-patient does not have medicare part B Medicaid application completed at  [facility],  [place] worker-[name] [phone] based on patient's income, he is likely to  be approved. Once approved, personal care services can be applied for as well as facility care if needed down the line Caregiver confirmed that patient is now on the list for Meals on Wheels Active listening / Reflection utilized  Emotional Support Provided Caregiver stress acknowledged  Caregiver confirmed that [facility] services will be started for patient         SDOH assessments and interventions completed:  Yes     Care Coordination Interventions Activated:  Yes  Care Coordination Interventions:  Yes, provided   Follow  up plan: Follow up call scheduled for 04/24/22    Encounter Outcome:  Pt. Visit Completed

## 2022-03-31 ENCOUNTER — Telehealth: Payer: Self-pay | Admitting: *Deleted

## 2022-03-31 NOTE — Progress Notes (Signed)
Kindly inform the patient that heart monitor study did not show any atrial fibrillation or significant arrhythmia.

## 2022-03-31 NOTE — Telephone Encounter (Signed)
-----   Message from Micki Riley, MD sent at 03/31/2022  5:04 PM EDT ----- Joneen Roach inform the patient that heart monitor study did not show any atrial fibrillation or significant arrhythmia.

## 2022-04-01 NOTE — Telephone Encounter (Signed)
I spoke with the patient's sister, Casey Rangel (as per [facility]) and informed her of the results. She verbalized understanding of the findings and expressed appreciation for the call.

## 2022-04-16 DIAGNOSIS — E440 Moderate protein-calorie malnutrition: Secondary | ICD-10-CM

## 2022-04-16 DIAGNOSIS — I110 Hypertensive heart disease with heart failure: Secondary | ICD-10-CM

## 2022-04-16 DIAGNOSIS — Z9181 History of falling: Secondary | ICD-10-CM

## 2022-04-16 DIAGNOSIS — I5043 Acute on chronic combined systolic (congestive) and diastolic (congestive) heart failure: Secondary | ICD-10-CM

## 2022-04-16 DIAGNOSIS — Z7982 Long term (current) use of aspirin: Secondary | ICD-10-CM

## 2022-04-16 DIAGNOSIS — I69320 Aphasia following cerebral infarction: Secondary | ICD-10-CM

## 2022-04-16 DIAGNOSIS — D649 Anemia, unspecified: Secondary | ICD-10-CM

## 2022-04-16 DIAGNOSIS — I69351 Hemiplegia and hemiparesis following cerebral infarction affecting right dominant side: Secondary | ICD-10-CM

## 2022-04-16 DIAGNOSIS — F05 Delirium due to known physiological condition: Secondary | ICD-10-CM

## 2022-04-24 ENCOUNTER — Telehealth: Payer: Self-pay | Admitting: Family

## 2022-04-24 ENCOUNTER — Ambulatory Visit: Payer: Self-pay | Admitting: *Deleted

## 2022-04-24 NOTE — Patient Outreach (Addendum)
Care Coordination   Follow Up Visit Note   04/24/2022 Name: Casey Rangel MRN: [id number] DOB: [birth year]  Casey Rangel is a 71 y.o. year old male who sees Mort Sawyers, FNP for primary care. I spoke with  [name] by phone today.  What matters to the patients health and wellness today?  In home support    Goals Addressed               This Visit's Progress     In home care needs (pt-stated)        Care Coordination Interventions:  Confirmed that patient's sister is his primary caregiver and continues to  request asistance with patient's care Patient's condition continues to progress-patient's sister discussed episodes of incontinence-bowel and urine is often unaware of this and resists getting cleaned up Patient's safety remains a concern, as patient often wants to drive, continues to remove dead bolt from the door, hides his medications and cannot manage his finances Per patient's sister, she continues to work on submission of required paperwork to complete the Medicaid application Caregiver confirmed referrals for Neurology and Cardiology-patient prescribed medication however has not started due to cost-patient's sister confirmed having the financial assistance application for the medications and is planning to submit the paperwork Caregiver stress acknowledged-emphasized self care and possibly involving her son to assist with patient's care. General management strategies of patients condition discussed Discussed importance of completing the Medicaid application and financial assistance paperwork so that additional care can be arranged for patient ie personal care assistance, needed assistance with costs of medications and a higher level of care if needed-,  Medicaid [place] [phone] Active listening / Reflection utilized  Emotional Support Provided Caregiver stress acknowledged  Caregiver confirmed that [facility] services have started-patient now receiving  PT/OT         SDOH assessments and interventions completed:  No     Care Coordination Interventions Activated:  Yes  Care Coordination Interventions:  Yes, provided   Follow up plan: Follow up call scheduled for 05/13/22    Encounter Outcome:  Pt. Visit Completed

## 2022-04-24 NOTE — Telephone Encounter (Signed)
Monique from [facility] [place] called in and stated that Casey Rangel will be discharged from PT next week. He will need to be transferred for speech therapy to another agency because they don't have anyone yet. Thank you!

## 2022-04-24 NOTE — Patient Instructions (Addendum)
Visit Information  Thank you for taking time to visit with me today. Please don't hesitate to contact me if I can be of assistance to you.   Following are the goals we discussed today:   Goals Addressed               This Visit's Progress     In home care needs (pt-stated)        Care Coordination Interventions:  Confirmed that patient's sister is his primary caregiver and continues to  request asistance with patient's care Patient's condition continues to progress-patient's sister discussed episodes of incontinence-bowel and urine is often unaware of this and resists getting cleaned up Patient's safety remains a concern, as patient often wants to drive, continues to remove dead bolt from the door, hides his medications and cannot manage his finances Per patient's sister, she continues to work on submission of required paperwork to complete the Medicaid application Caregiver confirmed referrals for Neurology and Cardiology-patient prescribed medication however has not started due to cost-patient's sister confirmed having the financial assistance application for the medications and is planning to submit the paperwork Caregiver stress acknowledged-emphasized self care and possibly involving her son to assist with patient's care. General management strategies of patients condition discussed Discussed importance of completing the Medicaid application and financial assistance paperwork so that additional care can be arranged for patient ie personal care assistance, needed assistance with costs of medications and a higher level of care if needed-,  Medicaid [place] [phone] Active listening / Reflection utilized  Emotional Support Provided Caregiver stress acknowledged  Caregiver confirmed that [facility] services have started-patient now receiving PT/OT         Our next appointment is by telephone on 05/13/22 at 10am  Please call the care guide team at [phone] if you need  to cancel or reschedule your appointment.   If you are experiencing a Mental Health or Behavioral Health Crisis or need someone to talk to, please call 911   Patient verbalizes understanding of instructions and care plan provided today and agrees to view in MyChart. Active MyChart status and patient understanding of how to access instructions and care plan via MyChart confirmed with patient.     Telephone follow up appointment with care management team member scheduled for: 05/13/22  Adriana Reams [facility] Care Management [phone]

## 2022-04-24 NOTE — Telephone Encounter (Signed)
You do need to put in another order for speech with a different agency. He has met his PT goal but they do not have anyone to do speech therapy.

## 2022-04-24 NOTE — Telephone Encounter (Signed)
Kelly please call [facility] and get more details. Does this mean we need to create an order or is this an FYI?

## 2022-04-25 ENCOUNTER — Telehealth: Payer: Self-pay

## 2022-04-25 DIAGNOSIS — F01518 Vascular dementia, unspecified severity, with other behavioral disturbance: Secondary | ICD-10-CM

## 2022-04-25 NOTE — Telephone Encounter (Signed)
Casey Rangel am so sorry but I did not order the order for speech therapy please call them and let them know that this would have to be changed from neurology as they are the one that placed a referral

## 2022-04-25 NOTE — Telephone Encounter (Signed)
Called and spoke to Neurology and they had orders in and sister said she wanted someone to come into the home. Neurology said that they will not be able to do speech if they can not go to where they send them.

## 2022-04-25 NOTE — Telephone Encounter (Signed)
Telephone call from [facility], [place] is unable to take the patient for At home speech therapy.  Per The patient sister the patient has gotten worse she do not know if is worth it to continue with looking for speech.  Please advise,, also for the Donepezil 5 mg 1/2 tab patient could not afford it so patient never started. Per Sister patient was hiding (3) bottles of Mel[place] and had 8 pills out to take at once.  She just want to let [place] [facility] know that.

## 2022-04-28 NOTE — Telephone Encounter (Signed)
Holding referral per [facility]

## 2022-04-28 NOTE — Addendum Note (Signed)
Addended by: Leida Lauth on: 04/28/2022 04:16 PM   Modules accepted: Orders

## 2022-04-29 ENCOUNTER — Other Ambulatory Visit: Payer: Self-pay

## 2022-04-29 NOTE — Patient Outreach (Signed)
Care Coordination   04/29/2022 Name: Casey Rangel MRN: [id number] DOB: 01/14/1951    Telephone outreach to patient to obtain mRS was successfully completed with sister. MRS= 3  Vanice Sarah [facility] Care Management Assistant [phone]

## 2022-05-01 NOTE — Telephone Encounter (Signed)
Read recent neurology note. Pt to continue f/u with neurology as it appears speech therapy may be being reconsidered as pt condition is deteriorating.

## 2022-05-02 ENCOUNTER — Telehealth: Payer: Self-pay | Admitting: Physician Assistant

## 2022-05-02 NOTE — Telephone Encounter (Signed)
Cathy from [facility] called, she needs orders for pt  Speech therapy 1x 1 week 2x 3weeks 1x 3 weeks  [phone] if have any questions

## 2022-05-05 NOTE — Telephone Encounter (Signed)
Cathy from [facility] called, and was given the  orders for    Speech therapy  1x 1 week  2x 3weeks  1x 3 weeks

## 2022-05-08 ENCOUNTER — Telehealth: Payer: Self-pay | Admitting: Physician Assistant

## 2022-05-08 NOTE — Telephone Encounter (Signed)
Sandy with [facility] called to report she met with the patient and that is it, no further orders.

## 2022-05-09 ENCOUNTER — Telehealth: Payer: Self-pay | Admitting: Physician Assistant

## 2022-05-09 NOTE — Telephone Encounter (Signed)
Casey Rangel from [facility] called in and left a message with the access nurse. The physical therapist was supposed to see the patient, but his wife pushed the visit until Monday.Casey Rangel does not need a call back unless the provider has any issues with the appt being pushed back to Monday.

## 2022-05-12 NOTE — Progress Notes (Deleted)
Cardiology Office Note  Date:  05/12/2022   ID:  Casey Rangel, DOB 06-Nov-1950, MRN [id number]  PCP:  Mort Sawyers, FNP   No chief complaint on file.   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  PSH:    Past Surgical History:  Procedure Laterality Date   INTRAMEDULLARY (IM) NAIL INTERTROCHANTERIC Left 10/17/2020   Procedure: INTRAMEDULLARY (IM) NAIL INTERTROCHANTRIC;  Surgeon: Kennedy Bucker, MD;  Location: [facility] ORS;  Service: Orthopedics;  Laterality: Left;   IR CT HEAD LTD  01/15/2022   IR PERCUTANEOUS ART THROMBECTOMY/INFUSION INTRACRANIAL INC DIAG ANGIO  01/15/2022   IR PTA INTRACRANIAL  01/15/2022   IR US GUIDE VASC ACCESS RIGHT  01/15/2022   RADIOLOGY WITH ANESTHESIA N/A 01/15/2022   Procedure: RADIOLOGY WITH ANESTHESIA;  Surgeon: Julieanne Cotton, MD;  Location: [facility] OR;  Service: Radiology;  Laterality: N/A;   T9-T11 fusion in 1990      Current Outpatient Medications  Medication Sig Dispense Refill   aspirin EC 81 MG tablet Take 1 tablet (81 mg total) by mouth daily. Swallow whole. 30 tablet 12   carvedilol (COREG) 3.125 MG tablet Take 1 tablet (3.125 mg total) by mouth 2 (two) times daily with a meal. (Patient not taking: Reported on 03/11/2022) 60 tablet 1   donepezil (ARICEPT) 10 MG tablet Take half tablet (5 mg) daily for 2  weeks, then increase to the full tablet at 10 mg daily 30 tablet 3   sacubitril-valsartan (ENTRESTO) 24-26 MG Take 1 tablet by mouth 2 (two) times daily. (Patient not taking: Reported on 03/11/2022) 60 tablet 1   senna-docusate (SENOKOT-S) 8.6-50 MG tablet Take 1 tablet by mouth at bedtime as needed for mild constipation or moderate constipation. (Patient not taking: Reported on 03/20/2022) 30 tablet 1   No current facility-administered medications for this visit.     Allergies:   Penicillins   Social History:  The patient  reports that he quit smoking about 22 years ago. His smoking use included cigarettes. He has a 45.00 pack-year smoking history. He has never used smokeless tobacco. He reports that he does not currently use alcohol. He reports that he does not use drugs.   Family History:   family history includes Hearing loss in his mother and paternal grandmother; Liver disease in his brother.    Review of Systems: ROS   PHYSICAL EXAM: VS:  There were no vitals taken for this visit. , BMI There is no height or weight on file to calculate BMI. GEN: Well nourished, well developed, in no acute distress HEENT: normal Neck: no JVD, carotid bruits, or masses Cardiac: RRR; no murmurs, rubs, or gallops,no edema  Respiratory:  clear to auscultation bilaterally, normal work of breathing GI: soft, nontender, nondistended, + BS MS: no deformity or atrophy Skin: warm and dry, no rash Neuro:  Strength and sensation are intact Psych: euthymic mood, full affect    Recent Labs:  03/10/2022: ALT 18; BUN 21; Creatinine, Ser 0.80; Hemoglobin 13.9; Hemoglobin 13.9; Platelets 278; Potassium 4.2; Potassium 4.2; Sodium 140; Sodium 140    Lipid Panel Lab Results  Component Value Date   CHOL 178 01/16/2022   HDL 40 (L) 01/16/2022   LDLCALC 129 (H) 01/16/2022   TRIG 43 01/16/2022      Wt Readings from Last 3 Encounters:  03/20/22 168 lb (76.2 kg)  03/11/22 170 lb 4 oz (77.2 kg)  03/10/22 171  lb 15.3 oz (78 kg)       ASSESSMENT AND PLAN:  Problem List Items Addressed This Visit   None    Disposition:   F/U  12 months   Total encounter time more than 30 minutes  Greater than 50% was spent in counseling and coordination of care with the patient    Signed, Dossie Arbour, M.D., Ph.D. [facility] [place], [place] [phone]

## 2022-05-13 ENCOUNTER — Ambulatory Visit: Payer: Self-pay | Admitting: *Deleted

## 2022-05-13 ENCOUNTER — Ambulatory Visit: Payer: Self-pay | Attending: Cardiovascular Disease | Admitting: Cardiovascular Disease

## 2022-05-13 DIAGNOSIS — I639 Cerebral infarction, unspecified: Secondary | ICD-10-CM

## 2022-05-13 DIAGNOSIS — I420 Dilated cardiomyopathy: Secondary | ICD-10-CM

## 2022-05-13 DIAGNOSIS — I6529 Occlusion and stenosis of unspecified carotid artery: Secondary | ICD-10-CM

## 2022-05-13 DIAGNOSIS — F01C0 Vascular dementia, severe, without behavioral disturbance, psychotic disturbance, mood disturbance, and anxiety: Secondary | ICD-10-CM

## 2022-05-13 DIAGNOSIS — I5042 Chronic combined systolic (congestive) and diastolic (congestive) heart failure: Secondary | ICD-10-CM

## 2022-05-13 DIAGNOSIS — R4701 Aphasia: Secondary | ICD-10-CM

## 2022-05-13 DIAGNOSIS — I739 Peripheral vascular disease, unspecified: Secondary | ICD-10-CM

## 2022-05-14 NOTE — Patient Outreach (Signed)
Care Coordination   Follow Up Visit Note   05/14/2022 Name: Casey Rangel MRN: [id number] DOB: 08-01-1951  Casey Rangel is a 71 y.o. year old male who sees Mort Sawyers, FNP for primary care. I spoke with  Casey Rangel's sister by phone today.  What matters to the patients health and wellness today?  In hone care    Goals Addressed               This Visit's Progress     In home care needs (pt-stated)        Care Coordination Interventions:  Confirmed that patient's sister is his primary caregiver and continues to  request asistance with patient's care Patient's condition continues to progress-continued episodes of incontinence-bowel and urine resistant to assistance with clean up Patient confirmed that patient has been approved for Medicaid and can now afford to fill his prescriptions received from Neurology and Cardiology Patient is also eligible for a in home aid covered by Medicaid-patient's sister agreeable-request form for an assessment for personal care services to be sent to patient's provider to complete Caregiver stress acknowledged-emphasized self care and possibly involving her son to assist with patient's care. General management strategies of patients condition discussed Active listening / Reflection utilized  Emotional Support Provided Caregiver stress acknowledged  Caregiver confirmed that [facility] services have started-patient now receiving PT/OT         SDOH assessments and interventions completed:  No     Care Coordination Interventions Activated:  Yes  Care Coordination Interventions:  Yes, provided   Follow up plan: Follow up call scheduled for 05/27/22    Encounter Outcome:  Pt. Visit Completed

## 2022-05-14 NOTE — Patient Instructions (Signed)
Visit Information  Thank you for taking time to visit with me today. Please don't hesitate to contact me if I can be of assistance to you.   Following are the goals we discussed today:   Goals Addressed               This Visit's Progress     In home care needs (pt-stated)        Care Coordination Interventions:  Confirmed that patient's sister is his primary caregiver and continues to  request asistance with patient's care Patient's condition continues to progress-continued episodes of incontinence-bowel and urine resistant to assistance with clean up Patient confirmed that patient has been approved for Medicaid and can now afford to fill his prescriptions received from Neurology and Cardiology Patient is also eligible for a in home aid covered by Medicaid-patient's sister agreeable-request form for an assessment for personal care services to be sent to patient's provider to complete Caregiver stress acknowledged-emphasized self care and possibly involving her son to assist with patient's care. General management strategies of patients condition discussed Active listening / Reflection utilized  Emotional Support Provided Caregiver stress acknowledged  Caregiver confirmed that [facility] services have started-patient now receiving PT/OT         Our next appointment is by telephone on 05/27/22 at 10am  Please call the care guide team at [phone] if you need to cancel or reschedule your appointment.   If you are experiencing a Mental Health or [place] or need someone to talk to, please call the Suicide and Crisis Lifeline: 988   Patient verbalizes understanding of instructions and care plan provided today and agrees to view in [place]. Active MyChart status and patient understanding of how to access instructions and care plan via MyChart confirmed with patient.     Telephone follow up appointment with care management team member scheduled for: 05/27/22  Elliot Gurney, [place] Worker  [facility] Care Management [phone]

## 2022-05-20 ENCOUNTER — Telehealth: Payer: Self-pay | Admitting: Physician Assistant
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
## 2022-05-21 NOTE — Telephone Encounter (Signed)
Notified and completed call.

## 2022-05-22 ENCOUNTER — Ambulatory Visit: Payer: Self-pay | Admitting: Family

## 2022-05-22 ENCOUNTER — Telehealth: Payer: Self-pay | Admitting: Family
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
## 2022-05-22 NOTE — Telephone Encounter (Signed)
Called and spoke to the sister of Casey Rangel and informed of the information.
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
## 2022-05-27 ENCOUNTER — Ambulatory Visit: Payer: Self-pay | Admitting: *Deleted

## 2022-05-27 NOTE — Patient Instructions (Signed)
Visit Information  Thank you for taking time to visit with me today. Please don't hesitate to contact me if I can be of assistance to you.   Following are the goals we discussed today:   Goals Addressed               This Visit's Progress     In home care needs (pt-stated)        Care Coordination Interventions:  Patient's sister-primary caregiver and continues to  request asistance with patient's care Patient's sister discussed that patient's behavior is becoming more aggressive(attempting to drive, hiding silverware and scree drivers), he is refusing MD visits and [place] speech therapist has discontinued services due to his aggression Medication from Neurologist filled and has been benefical to help patient sleep but does not affect his aggression Patient confirmed that patient has been approved for Medicaid,  CAP services and in home care services discussed, as well as out of home placement-however per patient's sister he will refuse this service Caregiver stress acknowledged-emphasized self care and possibly involving her son to assist with patient's care. General management strategies of patients condition discussed Safety Plan to discussed, calling 911 suggested if sister feels that she is in danger Active listening / Reflection utilized  Emotional Support Provided Caregiver stress acknowledged          Our next appointment is by telephone on 06/17/22 at 10am  Please call the care guide team at [phone] if you need to cancel or reschedule your appointment.   If you are experiencing a Mental Health or [place] or need someone to talk to, please call the Suicide and Crisis Lifeline: 988 call 911   Patient verbalizes understanding of instructions and care plan provided today and agrees to view in [place]. Active MyChart status and patient understanding of how to access instructions and care plan via MyChart confirmed with patient.     Telephone follow up  appointment with care management team member scheduled for: 06/17/22  Elliot Gurney, [place] Worker  [facility] Care Management [phone]

## 2022-05-27 NOTE — Patient Outreach (Signed)
Care Coordination   Follow Up Visit Note   05/27/2022 Name: Casey Rangel MRN: [id number] DOB: [birth year]  Casey Rangel is a 71 y.o. year old male who sees Mort Sawyers, FNP for primary care. I spoke with  Casey Rangel 's sister by phone today.  What matters to the patients health and wellness today?  In home care resources    Goals Addressed               This Visit's Progress     In home care needs (pt-stated)        Care Coordination Interventions:  Patient's sister-primary caregiver and continues to  request asistance with patient's care Patient's sister discussed that patient's behavior is becoming more aggressive(attempting to drive, hiding silverware and scree drivers), he is refusing MD visits and HH speech therapist has discontinued services due to his aggression Medication from Neurologist filled and has been benefical to help patient sleep but does not affect his aggression Patient confirmed that patient has been approved for Medicaid,  CAP services and in home care services discussed, as well as out of home placement-however per patient's sister he will refuse this service Caregiver stress acknowledged-emphasized self care and possibly involving her son to assist with patient's care. General management strategies of patients condition discussed Safety Plan to discussed, calling 911 suggested if sister feels that she is in danger Active listening / Reflection utilized  Emotional Support Provided Caregiver stress acknowledged          SDOH assessments and interventions completed:  No     Care Coordination Interventions Activated:  Yes  Care Coordination Interventions:  Yes, provided   Follow up plan: Follow up call scheduled for 06/17/22    Encounter Outcome:  Pt. Visit Completed

## 2022-06-06 ENCOUNTER — Other Ambulatory Visit: Payer: Self-pay | Admitting: Physician Assistant

## 2022-06-06 MED ORDER — QUETIAPINE FUMARATE 25 MG PO TABS: 25.0000 mg | ORAL_TABLET | Freq: Every day | ORAL | 11 refills | Status: DC

## 2022-06-17 ENCOUNTER — Encounter: Payer: Self-pay | Admitting: *Deleted

## 2022-06-25 ENCOUNTER — Telehealth: Payer: Self-pay | Admitting: Anesthesiology

## 2022-06-25 NOTE — Telephone Encounter (Signed)
No answer at 4:19pm, we have been in clinic

## 2022-06-25 NOTE — Telephone Encounter (Signed)
Patient's sister Casey Rangel called asking to speak to Casey Rangel about patient's health she is very worried about him. Patient has an appt on 11/9. However the patient would like to speak to Casey Rangel today if possible. Telephone to reach her is [phone]. She is available anytime before 12 pm.

## 2022-06-26 ENCOUNTER — Ambulatory Visit: Payer: Self-pay | Admitting: Physician Assistant

## 2022-06-26 NOTE — Progress Notes (Deleted)
Assessment/Plan:   Dementia likely due to  Casey Rangel is a very pleasant 71 y.o. RH male seen today in follow up for memory loss. Patient is currently on . MRI brain personally reviewed was remarkable for       Follow up in   months.    Subjective:    This patient is accompanied in the office by *** who supplements the history.  Previous records as well as any outside records available were reviewed prior to todays visit. Last seen on 03/23/22 at which time his MMSE was 11/30   Any changes in memory since last visit? repeats oneself?  Endorsed Disoriented when walking into a room?  Patient denies   Leaving objects in unusual places?  Patient denies   Ambulates  with difficulty?   Patient denies   Recent falls?  Patient denies   Any head injuries?  Patient denies   History of seizures?   Patient denies   Wandering behavior?  Patient denies   Patient drives?   Patient no longer drives  Any mood changes since last visit?  He has become more aggressive, attempting to drive, hiding silverware and screwdrivers. HH speech therapists have discontinued the services due to aggression.  He refuses MD visits Any worsening depression?:  Patient denies   Hallucinations?  Patient denies   Paranoia?  Patient denies   Patient reports that sleeps well without vivid dreams, REM behavior or sleepwalking   History of sleep apnea?  Patient denies   Any hygiene concerns?  Patient denies   Independent of bathing and dressing?  Endorsed  Does the patient needs help with medications?  in charge *** Who is in charge of the finances?   is in charge  *** Any changes in appetite?  Patient denies  *** Patient have trouble swallowing? Patient denies   Does the patient cook?  Patient denies   Any kitchen accidents such as leaving the stove on? Patient denies   Any headaches?  Patient denies   Double vision? Patient denies   Any focal numbness or tingling?  Patient denies   Chronic back pain Patient  denies   Unilateral weakness?  Patient denies   Any tremors?  Patient denies   Any history of anosmia?  Patient denies   Any incontinence of urine?  Patient denies   Any bowel dysfunction?   Patient denies      Patient lives with:    PREVIOUS MEDICATIONS:   CURRENT MEDICATIONS:  Outpatient Encounter Medications as of 06/26/2022  Medication Sig   aspirin EC 81 MG tablet Take 1 tablet (81 mg total) by mouth daily. Swallow whole.   carvedilol (COREG) 3.125 MG tablet Take 1 tablet (3.125 mg total) by mouth 2 (two) times daily with a meal. (Patient not taking: Reported on 03/11/2022)   donepezil (ARICEPT) 10 MG tablet Take half tablet (5 mg) daily for 2 weeks, then increase to the full tablet at 10 mg daily   QUEtiapine (SEROQUEL) 25 MG tablet Take 1 tablet (25 mg total) by mouth at bedtime.   sacubitril-valsartan (ENTRESTO) 24-26 MG Take 1 tablet by mouth 2 (two) times daily. (Patient not taking: Reported on 03/11/2022)   senna-docusate (SENOKOT-S) 8.6-50 MG tablet Take 1 tablet by mouth at bedtime as needed for mild constipation or moderate constipation. (Patient not taking: Reported on 03/20/2022)   No facility-administered encounter medications on file as of 06/26/2022.       03/23/2022    9:00 AM 03/11/2022  9:59 AM  MMSE - Mini Mental State Exam  Orientation to time 1 0  Orientation to Place 3 5  Registration 1 1  Attention/ Calculation 0   Recall 0 0  Language- name 2 objects 2 2  Language- repeat 0 0  Language- follow 3 step command 3 3  Language- read & follow direction 1 1  Write a sentence 0 0  Write a sentence-comments  unable to write since stroke  Copy design 0 0  Total score 11        No data to display          Objective:     PHYSICAL EXAMINATION:    VITALS:  There were no vitals filed for this visit.  GEN:  The patient appears stated age and is in NAD. HEENT:  Normocephalic, atraumatic.   Neurological examination:  General: NAD, well-groomed, appears  stated age. Orientation: The patient is alert. Oriented to person, place and date Cranial nerves: There is good facial symmetry.The speech is fluent and clear. No aphasia or dysarthria. Fund of knowledge is appropriate. Recent and remote memory are impaired. Attention and concentration are reduced.  Able to name objects and repeat phrases.  Hearing is intact to conversational tone.    Sensation: Sensation is intact to light touch throughout Motor: Strength is at least antigravity x4. Tremors: none  DTR's 2/4 in UE/LE     Movement examination: Tone: There is normal tone in the UE/LE Abnormal movements:  no tremor.  No myoclonus.  No asterixis.   Coordination:  There is no decremation with RAM's. Normal finger to nose  Gait and Station: The patient has no difficulty arising out of a deep-seated chair without the use of the hands. The patient's stride length is good.  Gait is cautious and narrow.    Thank you for allowing [place] the opportunity to participate in the care of this nice patient. Please do not hesitate to contact us for any questions or concerns.   Total time spent on today's visit was *** minutes dedicated to this patient today, preparing to see patient, examining the patient, ordering tests and/or medications and counseling the patient, documenting clinical information in the EHR or other health record, independently interpreting results and communicating results to the patient/family, discussing treatment and goals, answering patient's questions and coordinating care.  Cc:  Mort Sawyers, FNP  Marlowe Kays 06/26/2022 6:44 AM

## 2022-07-16 ENCOUNTER — Other Ambulatory Visit: Payer: Self-pay | Admitting: Family

## 2022-09-02 ENCOUNTER — Ambulatory Visit: Payer: Self-pay | Admitting: Family

## 2023-02-11 ENCOUNTER — Emergency Department
Admission: EM | Admit: 2023-02-11 | Discharge: 2023-02-11 | Disposition: A | Discharge status: Home / Self Care | Payer: Self-pay | Attending: Emergency Medicine | Admitting: Emergency Medicine

## 2023-02-11 ENCOUNTER — Emergency Department: Payer: Medicaid Other

## 2023-02-11 ENCOUNTER — Emergency Department: Payer: Self-pay

## 2023-02-11 ENCOUNTER — Telehealth: Payer: Self-pay | Admitting: Neurosurgery

## 2023-02-11 ENCOUNTER — Encounter: Payer: Self-pay | Admitting: Emergency Medicine

## 2023-02-11 ENCOUNTER — Telehealth: Payer: Self-pay | Admitting: Family

## 2023-02-11 DIAGNOSIS — W010XXA Fall on same level from slipping, tripping and stumbling without subsequent striking against object, initial encounter: Secondary | ICD-10-CM | POA: Insufficient documentation

## 2023-02-11 DIAGNOSIS — S79911A Unspecified injury of right hip, initial encounter: Secondary | ICD-10-CM | POA: Insufficient documentation

## 2023-02-11 DIAGNOSIS — S065X0A Traumatic subdural hemorrhage without loss of consciousness, initial encounter: Secondary | ICD-10-CM | POA: Insufficient documentation

## 2023-02-11 DIAGNOSIS — F0150 Vascular dementia without behavioral disturbance: Secondary | ICD-10-CM | POA: Insufficient documentation

## 2023-02-11 DIAGNOSIS — S065XAA Traumatic subdural hemorrhage with loss of consciousness status unknown, initial encounter: Secondary | ICD-10-CM

## 2023-02-11 DIAGNOSIS — Y9301 Activity, walking, marching and hiking: Secondary | ICD-10-CM | POA: Insufficient documentation

## 2023-02-11 DIAGNOSIS — W19XXXA Unspecified fall, initial encounter: Secondary | ICD-10-CM

## 2023-02-11 DIAGNOSIS — Y92009 Unspecified place in unspecified non-institutional (private) residence as the place of occurrence of the external cause: Secondary | ICD-10-CM | POA: Insufficient documentation

## 2023-02-11 LAB — PROTIME-INR
INR: 1.1 (ref 0.8–1.2)
Prothrombin Time: 14.4 seconds (ref 11.4–15.2)

## 2023-02-11 LAB — CBC WITH DIFFERENTIAL/PLATELET
Abs Immature Granulocytes: 0.03 10*3/uL (ref 0.00–0.07)
Basophils Absolute: 0.2 10*3/uL — ABNORMAL HIGH (ref 0.0–0.1)
Basophils Relative: 2 %
Eosinophils Absolute: 0.3 10*3/uL (ref 0.0–0.5)
Eosinophils Relative: 4 %
HCT: 45 % (ref 39.0–52.0)
Hemoglobin: 14.8 g/dL (ref 13.0–17.0)
Immature Granulocytes: 0 %
Lymphocytes Relative: 20 %
Lymphs Abs: 1.6 10*3/uL (ref 0.7–4.0)
MCH: 30.5 pg (ref 26.0–34.0)
MCHC: 32.9 g/dL (ref 30.0–36.0)
MCV: 92.8 fL (ref 80.0–100.0)
Monocytes Absolute: 0.5 10*3/uL (ref 0.1–1.0)
Monocytes Relative: 6 %
Neutro Abs: 5.6 10*3/uL (ref 1.7–7.7)
Neutrophils Relative %: 68 %
Platelets: 273 10*3/uL (ref 150–400)
RBC: 4.85 MIL/uL (ref 4.22–5.81)
RDW: 13.3 % (ref 11.5–15.5)
WBC: 8.2 10*3/uL (ref 4.0–10.5)
nRBC: 0 % (ref 0.0–0.2)

## 2023-02-11 LAB — COMPREHENSIVE METABOLIC PANEL
ALT: 8 U/L (ref 0–44)
AST: 14 U/L — ABNORMAL LOW (ref 15–41)
Albumin: 4.1 g/dL (ref 3.5–5.0)
Alkaline Phosphatase: 55 U/L (ref 38–126)
Anion gap: 9 (ref 5–15)
BUN: 28 mg/dL — ABNORMAL HIGH (ref 8–23)
CO2: 23 mmol/L (ref 22–32)
Calcium: 9.1 mg/dL (ref 8.9–10.3)
Chloride: 108 mmol/L (ref 98–111)
Creatinine, Ser: 0.97 mg/dL (ref 0.61–1.24)
GFR, Estimated: >60 mL/min (ref >60)
Glucose, Bld: 93 mg/dL (ref 70–99)
Potassium: 4.3 mmol/L (ref 3.5–5.1)
Sodium: 140 mmol/L (ref 135–145)
Total Bilirubin: 1.3 mg/dL — ABNORMAL HIGH (ref 0.3–1.2)
Total Protein: 6.8 g/dL (ref 6.5–8.1)

## 2023-02-11 LAB — URINALYSIS, W/ REFLEX TO CULTURE (INFECTION SUSPECTED)
Bacteria, UA: NONE SEEN
Bilirubin Urine: NEGATIVE
Glucose, UA: NEGATIVE mg/dL
Hgb urine dipstick: NEGATIVE
Ketones, ur: 5 mg/dL — AB
Leukocytes,Ua: NEGATIVE
Nitrite: NEGATIVE
Protein, ur: NEGATIVE mg/dL
Specific Gravity, Urine: 1.024 (ref 1.005–1.030)
pH: 5 (ref 5.0–8.0)

## 2023-02-11 LAB — APTT: aPTT: 27 seconds (ref 24–36)

## 2023-02-11 NOTE — ED Notes (Signed)
[employer] notified to arrange transportation back home via [place]

## 2023-02-11 NOTE — ED Notes (Signed)
Pt attempted to use urinal but unable to void , pt is refusing In and out cath at this time

## 2023-02-11 NOTE — ED Provider Notes (Signed)
I was asked to follow-up on this patient's repeat CT head as well as ambulation trial.  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  Dr. Wendee Copp with neurosurgery recommends repeat CT head at 6 hours.  Patient's repeat CT head is unchanged.  He was able to ambulate with assistance.  Will discharge with neurosurgery follow-up.   Georga Hacking, MD 02/11/23 [phone]

## 2023-02-11 NOTE — Telephone Encounter (Signed)
Pt's sister, [name], called to let Alfonse Alpers know that the pt took a fall this morning, 6/26, & was rushed to [facility] Ms. [name] states the thinks the pt might've broken the hip that wasn't previously hurt. Call back # [phone]

## 2023-02-11 NOTE — ED Notes (Signed)
Patient ambulated self w/ cane x1 standby assist to nursing station and back to his bed without incident. MD [facility] notified.

## 2023-02-11 NOTE — ED Provider Notes (Signed)
[facility] Provider Note    Event Date/Time   First MD Initiated Contact with Patient 02/11/23 [phone]     (approximate)   History   Fall and Hip Pain   HPI  Casey Rangel is a 72 y.o. male   Past medical history of prior stroke, vascular dementia, who presents to the emergency department with a fall from home.  He states that he was walking slipped and fell and hurt his right hip.  He denies head strike or loss of consciousness.  He understands he is in the hospital and being assessed for a fall and denies any other acute medical complaints, but does seem confused at times asking me to repeat questions frequently  He specifically denies any presyncopal symptoms, chest pain, respiratory symptoms, GI or GU complaints.  I attempted to call his Sister Casey Rangel who lives with the patient but she did not pick up the phone.  I do see a telephone encounter from 8:07 AM today with the sister called to report that the patient had taken a fall and was concerned that he broke his hip.   External Medical Documents Reviewed: Telephone encounter from earlier this morning documenting the above      Physical Exam   Triage Vital Signs: ED Triage Vitals [02/11/23 0835]  Enc Vitals Group     BP 134/80     Pulse Rate 69     Resp 18     Temp 98.5 F (36.9 C)     Temp src      SpO2 97 %     Weight 167 lb (75.8 kg)     Height 5\' 10"  (1.778 m)     Head Circumference      Peak Flow      Pain Score 0     Pain Loc      Pain Edu?      Excl. in GC?     Most recent vital signs: Vitals:   02/11/23 0835  BP: 134/80  Pulse: 69  Resp: 18  Temp: 98.5 F (36.9 C)  SpO2: 97%    General: Awake, no distress.  CV:  Good peripheral perfusion.  Resp:  Normal effort.  Abd:  No distention.  Other:  Awake and alert, disheveled appearing, but oriented to self and situation and following all commands. Will ask I repeat questions frequently, unclear if hard of hearing or  difficulty understanding. He is able to range all extremities and able to lift both his legs up off the stretcher and range at the hip and he is neurovascular intact to the affected right lower extremity.  Is a soft nontender abdomen, chest wall appears normal breath sounds bilaterally and neck is supple with full range of motion there is no obvious head trauma.   ED Results / Procedures / Treatments   Labs (all labs ordered are listed, but only abnormal results are displayed) Labs Reviewed  CBC WITH DIFFERENTIAL/PLATELET - Abnormal; Notable for the following components:      Result Value   Basophils Absolute 0.2 (*)    All other components within normal limits  COMPREHENSIVE METABOLIC PANEL - Abnormal; Notable for the following components:   BUN 28 (*)    AST 14 (*)    Total Bilirubin 1.3 (*)    All other components within normal limits  PROTIME-INR  APTT  URINALYSIS, W/ REFLEX TO CULTURE (INFECTION SUSPECTED)     RADIOLOGY I independently reviewed and interpreted CT scan of  the head and see a small bilateral subdurals.   PROCEDURES:  Critical Care performed: Yes, see critical care procedure note(s)  .Critical Care  Performed by: Pilar Jarvis, MD Authorized by: Pilar Jarvis, MD   Critical care provider statement:    Critical care time (minutes):  30   Critical care was time spent personally by me on the following activities:  Development of treatment plan with patient or surrogate, discussions with consultants, evaluation of patient's response to treatment, examination of patient, ordering and review of laboratory studies, ordering and review of radiographic studies, ordering and performing treatments and interventions, pulse oximetry, re-evaluation of patient's condition and review of old charts    MEDICATIONS ORDERED IN ED: Medications - No data to display  IMPRESSION / MDM / ASSESSMENT AND PLAN / ED COURSE  I reviewed the triage vital signs and the nursing notes.                                 Patient's presentation is most consistent with acute presentation with potential threat to life or bodily function.  Differential diagnosis includes, but is not limited to, mechanical slip and fall leading to blunt traumatic injury including intracranial bleeding, cervical spine fracture or dislocation, hip fracture dislocation.   MDM: Patient with a history of vascular dementia but is here reporting and mechanical slip and fall with right hip injury.  Appears disheveled but otherwise largely atraumatic on my exam and able to range at the affected hip.  Will obtain an hip x-ray as well as a CT of the head and neck.  He offers no other acute medical complaints and states that the fall was mechanical in nature. Unclear what his baseline mentation is but seems slightly confused.   SDH on imaging. Nsgy consulted.  Repeat CT scan ordered for 6 hours.  He is not on blood thinners per medical chart review, aspirin only.  He is neurovascular intact moving all extremities, questionable mentation/confusion but unclear what baseline is.  I have added on labs including coags, EKG, urinalysis.  --- I spoke with Dr. Marcell Barlow who recommends repeat CT scan in 6 hours.  If negative can discharge and follow-up in clinic.  Appears subacute  I spoke with Casey Rangel and informed her of results.  His mentation appears to be at baseline.  She worries that he will be unable to walk with his injury.  I assured her that we will ambulate trial prior to disposition, which will be made after 3:30 PM CT scan repeat lab testing as well.       FINAL CLINICAL IMPRESSION(S) / ED DIAGNOSES   Final diagnoses:  Fall, initial encounter  Hip injury, right, initial encounter  Subdural hematoma (HCC)     Rx / DC Orders   ED Discharge Orders     None        Note:  This document was prepared using Dragon voice recognition software and may include unintentional dictation errors.     Pilar Jarvis, MD 02/11/23 1050

## 2023-02-11 NOTE — ED Triage Notes (Signed)
Pt here from home with c/o right hip pain after a fall at home , per ems pt was able to walk after fall , hx of stroke and walks with a cane ,

## 2023-02-11 NOTE — Telephone Encounter (Signed)
Noted.  Looks like he went today.  Can close note.

## 2023-02-11 NOTE — Telephone Encounter (Signed)
Dr. Jenell Milliner SDH fall  Dr.Yarbrough Will have him follow up in 4 weeks with repeat CT. I can see him in follow up in 15 minute spot or at 4 pm.  Can not do telephone follow up, in-person only.

## 2023-02-11 NOTE — Telephone Encounter (Signed)
I sent a message to scheduling.  No authorization is required.

## 2023-02-11 NOTE — ED Notes (Signed)
Spoke with patients sister and she will come pick him up for transport back home.

## 2023-02-12 NOTE — Telephone Encounter (Signed)
Left message to call back  

## 2023-02-13 NOTE — Telephone Encounter (Signed)
Left message to call back  

## 2023-02-16 ENCOUNTER — Inpatient Hospital Stay: Payer: Self-pay | Admitting: Family

## 2023-02-16 NOTE — Telephone Encounter (Signed)
Left message to call back  

## 2023-02-17 NOTE — Telephone Encounter (Addendum)
Per Dr Myer Haff, decline in cognitive function or mobility. I spoke with [name] about this and encouraged them to reach out if we can be of assistance or if they decide they want to reschedule the CT and appointment. At this time, she has not been able to get him to agree to go to any appointments. I also encouraged her to contact his PCP for guidance.

## 2023-02-17 NOTE — Telephone Encounter (Signed)
I spoke to [place] pt's sister. That is the only number on his chart. She states patient has declined any further treatment. He will not see any doctors. He is walking some better but his mind is just not here. Are there any symptoms that she should watch out for?

## 2023-03-16 ENCOUNTER — Telehealth: Payer: Self-pay | Admitting: Family

## 2023-03-16 NOTE — Telephone Encounter (Signed)
Pt's sister, Casey Rangel, called stating she is in need of help with pt. Casey Rangel states she takes care of the pt & it beginning to become hard for her to tend to him. Casey Rangel states the pt had a stroke a years ago, which caused dementia. Casey Rangel states a few months ago, the pt fell & was told he has bleeding on the brain. Casey Rangel stated today, 7/29, the pt used the bathroom on himself & he won't allow her to clean him up or change his clothes due to him being stubborn & so aggressive. Casey Rangel states the pt's HH ended due to him being so aggressive with his aids. Casey Rangel requested advice on getting pt into a facility for better help? Call back # [phone]

## 2023-03-17 ENCOUNTER — Encounter: Payer: Self-pay | Admitting: Physician Assistant

## 2023-03-17 ENCOUNTER — Encounter: Payer: Self-pay | Admitting: Family

## 2023-03-17 NOTE — Telephone Encounter (Signed)
Called patient sister states she received my chart message from [place]. I have reviewed message below to make sure no information missed. Sister states that she is thinking of calling EMS due to mental status changes and bowel and bladder incontinence. She will keep up updated on next steps and plan for [employer]

## 2023-03-17 NOTE — Telephone Encounter (Signed)
You will need to see patient in office for evaluation right?

## 2023-03-17 NOTE — Telephone Encounter (Signed)
Typically in these situations sister would have to look into facilities to have him placed. She could call insurance to see if any help there.   If in the recent past week or few days has had increased mental status changes would need to be seen asap either in office and or if pretty severe go to ER.   As far as dementia and brain bleed I am assuming he is still following with neurology? Pt can also request information to see if neurology has any recommendations for dementia patient placement. They may be more helpful.

## 2023-03-17 NOTE — Telephone Encounter (Signed)
Casey Rangel,   I hope you do not mind me reaching out. Patient's sister [name] states that the patient is declining with his dementia and it is difficult if not impossible for her to manage him at her home anymore and she was requesting the next step for placing him in an ALF or something similar. Do you have any recommendations or how is this typically managed? I do not feel as though I have the appropriate resources to guide her the correct way but I would love your insight for her as well as for my other cognitive patients. Thanks!    Regards,   Mort Sawyers FNP-C

## 2023-03-19 NOTE — Telephone Encounter (Signed)
I agree with this  

## 2023-03-25 ENCOUNTER — Telehealth: Payer: Self-pay | Admitting: Licensed Clinical Social Worker

## 2023-03-26 NOTE — Patient Instructions (Signed)
Visit Information  Thank you for taking time to visit with me today. Please don't hesitate to contact me if I can be of assistance to you.   Following are the goals we discussed today:   Goals Addressed               This Visit's Progress     Patient Stated     In home care needs (pt-stated)   On track     Care Coordination Interventions:  Patient's sister-primary caregiver and continues to  request asistance with patient's care Patient's sister discussed that patient's behavior is becoming more aggressive(attempting to drive, hiding silverware and scree drivers), he is refusing MD visits and HH speech therapist has discontinued services due to his aggression Medication from Neurologist filled and has been benefical to help patient sleep but does not affect his aggression Patient confirmed that patient has been approved for Medicaid,  CAP services and in home care services discussed, as well as out of home placement-however per patient's sister he will refuse this service Caregiver stress acknowledged-emphasized self care and possibly involving her son to assist with patient's care. General management strategies of patients condition discussed Safety Plan to discussed, calling 911 suggested if sister feels that she is in danger Active listening / Reflection utilized  Emotional Support Provided Caregiver stress acknowledged         Please call the care guide team at [phone] if you need to cancel or reschedule your appointment.   If you are experiencing a Mental Health or Behavioral Health Crisis or need someone to talk to, please call the Suicide and Crisis Lifeline: 988 call 911   Patient verbalizes understanding of instructions and care plan provided today and agrees to view in MyChart. Active MyChart status and patient understanding of how to access instructions and care plan via MyChart confirmed with patient.     Jenel Lucks, MSW, LCSW [facility] Care Management [facility]  [email or web address] Phone [phone] 5:52 PM

## 2023-03-26 NOTE — Patient Outreach (Signed)
Care Coordination   Initial Visit Note   03/25/2023 Name: Casey Rangel MRN: [id number] DOB: [birth year]  Lakyn Ave is a 72 y.o. year old male who sees Mort Sawyers, FNP for primary care. I spoke with  Savva Dipierro's sister by phone today.  What matters to the patients health and wellness today?  Placement    Goals Addressed               This Visit's Progress     Patient Stated     In home care needs (pt-stated)   On track     Care Coordination Interventions:  Patient's sister-primary caregiver and continues to  request asistance with patient's care Patient's sister discussed that patient's behavior is becoming more aggressive(attempting to drive, hiding silverware and scree drivers), he is refusing MD visits and HH speech therapist has discontinued services due to his aggression Medication from Neurologist filled and has been benefical to help patient sleep but does not affect his aggression Patient confirmed that patient has been approved for Medicaid,  CAP services and in home care services discussed, as well as out of home placement-however per patient's sister he will refuse this service Caregiver stress acknowledged-emphasized self care and possibly involving her son to assist with patient's care. 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 Encouragement and validation provided]  Nutrition Interventions   Nutrition Discussed/Reviewed Nutrition Reviewed  Pharmacy Interventions   Pharmacy Dicussed/Reviewed Pharmacy Topics Reviewed, Medication Adherence  Safety Interventions   Safety Discussed/Reviewed Safety Reviewed, Fall Risk       Follow up plan: Follow up call scheduled for 1 week    Encounter Outcome:  Pt. Visit Completed   Jenel Lucks, MSW, LCSW [facility] Care Management [facility] Health  [facility] [email or web address] Phone [phone] 5:51 PM

## 2023-04-02 ENCOUNTER — Encounter: Payer: Self-pay | Admitting: Licensed Clinical Social Worker

## 2023-04-02 ENCOUNTER — Telehealth: Payer: Self-pay | Admitting: *Deleted

## 2023-04-02 NOTE — Patient Instructions (Signed)
Visit Information  Thank you for taking time to visit with me today. Please don't hesitate to contact me if I can be of assistance to you.   Following are the goals we discussed today:  Please follow up with the Department of Social Services to apply for special assistance Medicaid for placement purposes.    Our next appointment is by telephone on 04/16/23 at 11am  Please call the care guide team at [phone] if you need to cancel or reschedule your appointment.   If you are experiencing a Mental Health or Behavioral Health Crisis or need someone to talk to, please call 911   Patient verbalizes understanding of instructions and care plan provided today and agrees to view in MyChart. Active MyChart status and patient understanding of how to access instructions and care plan via MyChart confirmed with patient.     Telephone follow up appointment with care management team member scheduled for: 04/16/23  Verna Czech, LCSW Clinical Social Worker  [facility] Care Management [phone]

## 2023-04-02 NOTE — Patient Outreach (Signed)
Care Coordination   Initial Visit Note   04/02/2023 Name: Hyram Knoell MRN: [id number] DOB: [birth year]  Caliber Omana is a 72 y.o. year old male who sees Mort Sawyers, FNP for primary care. I spoke with  Lao Balch's sister by phone today.  What matters to the patients health and wellness today?  Patient's sister discussed concern that patient is no longer able to take care of his own ADL's. Patient's sister states that she is no longer able to provide the care that patient needs. She is requesting assistance with out of home placement at this time.    Goals Addressed             This Visit's Progress    Placement in memory care       Interventions Today    Flowsheet Row Most Recent Value  Chronic Disease   Chronic disease during today's visit Hypertension (HTN), Other  [vascular dementia]  General Interventions   General Interventions Discussed/Reviewed Level of Care  Level of Care Skilled Nursing Facility, Applications  [patient's sister discussed concern that patient is incontinent and not able to complete his own ADL's. Patient's sister requesting assistance with placement for patient due to his physical limitations.]  Applications FL-2  [Fl2 to be requested by provider]  Education Interventions   Education Provided Provided Education  [placement process discussed as well as need to apply for special assistance Medicaid for placement purposes. Discussed possbility of sister applying for guardianship and financial payee as well]  Applications FL-2  [Fl2 to be requested by provider]              SDOH assessments and interventions completed:  Yes  SDOH Interventions Today    Flowsheet Row Most Recent Value  SDOH Interventions   Food Insecurity Interventions Intervention Not Indicated  Housing Interventions Intervention Not Indicated  Transportation Interventions Intervention Not Indicated  Utilities Interventions Intervention Not Indicated        Care  Coordination Interventions:  Yes, provided   Follow up plan: Follow up call scheduled for 04/16/23    Encounter Outcome:  Pt. Visit Completed

## 2023-04-03 NOTE — Patient Outreach (Signed)
Care Coordination   Collaboration  Visit Note   04/02/2023 Name: Casey Rangel MRN: [id number] DOB: 01-07-1951  Casey Rangel is a 72 y.o. year old male who sees Mort Sawyers, FNP for primary care. I  engaged with assigned LCSW  What matters to the patients health and wellness today?  Pt was not engaged during this encounter    SDOH assessments and interventions completed:  No     Care Coordination Interventions:  Yes, provided  Interventions Today    Flowsheet Row Most Recent Value  General Interventions   General Interventions Discussed/Reviewed Communication with  Communication with Social Work  Jay Schlichter provided update to assigned LCSW. Discussed patient care and barriers]       Follow up plan:  LCSW Land will continue to f/up with pt    Encounter Outcome:  Pt. Visit Completed   Jenel Lucks, MSW, LCSW [facility] Care Management [facility] Health  [facility] [email or web address] Phone [phone] 7:19 AM

## 2023-04-16 ENCOUNTER — Emergency Department (HOSPITAL_COMMUNITY): Payer: Medicare Other

## 2023-04-16 ENCOUNTER — Other Ambulatory Visit: Payer: Self-pay

## 2023-04-16 ENCOUNTER — Encounter (HOSPITAL_COMMUNITY): Payer: Self-pay | Admitting: Neurology

## 2023-04-16 ENCOUNTER — Inpatient Hospital Stay (HOSPITAL_COMMUNITY): Payer: Medicare Other

## 2023-04-16 ENCOUNTER — Telehealth: Payer: Self-pay | Admitting: *Deleted

## 2023-04-16 ENCOUNTER — Inpatient Hospital Stay (HOSPITAL_COMMUNITY)
Admission: EM | Admit: 2023-04-16 | Discharge: 2023-05-02 | DRG: 023 | Disposition: A | Discharge status: Skilled Nursing Facility | Payer: Medicare Other | Attending: Neurology | Admitting: Neurology

## 2023-04-16 ENCOUNTER — Ambulatory Visit: Payer: Self-pay | Admitting: *Deleted

## 2023-04-16 ENCOUNTER — Encounter (HOSPITAL_COMMUNITY)
Admission: EM | Disposition: A | Discharge status: Skilled Nursing Facility | Payer: Self-pay | Source: Home / Self Care | Attending: Neurology

## 2023-04-16 DIAGNOSIS — E440 Moderate protein-calorie malnutrition: Secondary | ICD-10-CM | POA: Insufficient documentation

## 2023-04-16 DIAGNOSIS — Z1152 Encounter for screening for COVID-19: Secondary | ICD-10-CM

## 2023-04-16 DIAGNOSIS — E785 Hyperlipidemia, unspecified: Secondary | ICD-10-CM | POA: Diagnosis present

## 2023-04-16 DIAGNOSIS — R451 Restlessness and agitation: Secondary | ICD-10-CM | POA: Diagnosis not present

## 2023-04-16 DIAGNOSIS — Z9181 History of falling: Secondary | ICD-10-CM

## 2023-04-16 DIAGNOSIS — G8191 Hemiplegia, unspecified affecting right dominant side: Secondary | ICD-10-CM | POA: Diagnosis present

## 2023-04-16 DIAGNOSIS — F0150 Vascular dementia without behavioral disturbance: Secondary | ICD-10-CM | POA: Diagnosis present

## 2023-04-16 DIAGNOSIS — I5042 Chronic combined systolic (congestive) and diastolic (congestive) heart failure: Secondary | ICD-10-CM | POA: Diagnosis present

## 2023-04-16 DIAGNOSIS — D72829 Elevated white blood cell count, unspecified: Secondary | ICD-10-CM | POA: Diagnosis present

## 2023-04-16 DIAGNOSIS — E876 Hypokalemia: Secondary | ICD-10-CM | POA: Diagnosis present

## 2023-04-16 DIAGNOSIS — R29727 NIHSS score 27: Secondary | ICD-10-CM | POA: Diagnosis present

## 2023-04-16 DIAGNOSIS — I6602 Occlusion and stenosis of left middle cerebral artery: Secondary | ICD-10-CM

## 2023-04-16 DIAGNOSIS — G309 Alzheimer's disease, unspecified: Secondary | ICD-10-CM | POA: Diagnosis present

## 2023-04-16 DIAGNOSIS — Z87891 Personal history of nicotine dependence: Secondary | ICD-10-CM

## 2023-04-16 DIAGNOSIS — Z822 Family history of deafness and hearing loss: Secondary | ICD-10-CM | POA: Diagnosis not present

## 2023-04-16 DIAGNOSIS — I429 Cardiomyopathy, unspecified: Secondary | ICD-10-CM | POA: Diagnosis present

## 2023-04-16 DIAGNOSIS — Z66 Do not resuscitate: Secondary | ICD-10-CM | POA: Diagnosis present

## 2023-04-16 DIAGNOSIS — I110 Hypertensive heart disease with heart failure: Secondary | ICD-10-CM | POA: Diagnosis present

## 2023-04-16 DIAGNOSIS — I69320 Aphasia following cerebral infarction: Secondary | ICD-10-CM

## 2023-04-16 DIAGNOSIS — R54 Age-related physical debility: Secondary | ICD-10-CM | POA: Diagnosis present

## 2023-04-16 DIAGNOSIS — Z781 Physical restraint status: Secondary | ICD-10-CM

## 2023-04-16 DIAGNOSIS — Z79899 Other long term (current) drug therapy: Secondary | ICD-10-CM

## 2023-04-16 DIAGNOSIS — Z6824 Body mass index (BMI) 24.0-24.9, adult: Secondary | ICD-10-CM

## 2023-04-16 DIAGNOSIS — Z7982 Long term (current) use of aspirin: Secondary | ICD-10-CM | POA: Diagnosis not present

## 2023-04-16 DIAGNOSIS — F02811 Dementia in other diseases classified elsewhere, unspecified severity, with agitation: Secondary | ICD-10-CM | POA: Diagnosis present

## 2023-04-16 DIAGNOSIS — R1310 Dysphagia, unspecified: Secondary | ICD-10-CM | POA: Diagnosis present

## 2023-04-16 DIAGNOSIS — H518 Other specified disorders of binocular movement: Secondary | ICD-10-CM | POA: Diagnosis present

## 2023-04-16 DIAGNOSIS — Z880 Allergy status to penicillin: Secondary | ICD-10-CM

## 2023-04-16 DIAGNOSIS — N179 Acute kidney failure, unspecified: Secondary | ICD-10-CM | POA: Diagnosis not present

## 2023-04-16 DIAGNOSIS — I63512 Cerebral infarction due to unspecified occlusion or stenosis of left middle cerebral artery: Secondary | ICD-10-CM | POA: Diagnosis not present

## 2023-04-16 DIAGNOSIS — I6203 Nontraumatic chronic subdural hemorrhage: Secondary | ICD-10-CM | POA: Diagnosis present

## 2023-04-16 DIAGNOSIS — J9601 Acute respiratory failure with hypoxia: Secondary | ICD-10-CM

## 2023-04-16 DIAGNOSIS — I69322 Dysarthria following cerebral infarction: Secondary | ICD-10-CM | POA: Diagnosis not present

## 2023-04-16 DIAGNOSIS — R4701 Aphasia: Secondary | ICD-10-CM | POA: Diagnosis present

## 2023-04-16 DIAGNOSIS — F0280 Dementia in other diseases classified elsewhere without behavioral disturbance: Secondary | ICD-10-CM | POA: Diagnosis present

## 2023-04-16 DIAGNOSIS — Z981 Arthrodesis status: Secondary | ICD-10-CM

## 2023-04-16 DIAGNOSIS — Z9911 Dependence on respirator [ventilator] status: Secondary | ICD-10-CM

## 2023-04-16 DIAGNOSIS — I639 Cerebral infarction, unspecified: Principal | ICD-10-CM

## 2023-04-16 DIAGNOSIS — R29810 Facial weakness: Secondary | ICD-10-CM | POA: Diagnosis present

## 2023-04-16 HISTORY — PX: IR PERCUTANEOUS ART THROMBECTOMY/INFUSION INTRACRANIAL INC DIAG ANGIO: IMG6087

## 2023-04-16 HISTORY — PX: IR CT HEAD LTD: IMG2386

## 2023-04-16 HISTORY — PX: RADIOLOGY WITH ANESTHESIA: SHX6223

## 2023-04-16 HISTORY — PX: IR INTRA CRAN STENT: IMG2345

## 2023-04-16 LAB — CBC
HCT: 45.4 % (ref 39.0–52.0)
HCT: 44.5 % (ref 39.0–52.0)
Hemoglobin: 15.5 g/dL (ref 13.0–17.0)
Hemoglobin: 15.2 g/dL (ref 13.0–17.0)
MCH: 30.5 pg (ref 26.0–34.0)
MCH: 30.6 pg (ref 26.0–34.0)
MCHC: 34.1 g/dL (ref 30.0–36.0)
MCHC: 34.2 g/dL (ref 30.0–36.0)
MCV: 89.4 fL (ref 80.0–100.0)
MCV: 89.5 fL (ref 80.0–100.0)
Platelets: 287 10*3/uL (ref 150–400)
Platelets: 259 10*3/uL (ref 150–400)
RBC: 5.08 MIL/uL (ref 4.22–5.81)
RBC: 4.97 MIL/uL (ref 4.22–5.81)
RDW: 13.2 % (ref 11.5–15.5)
RDW: 13.2 % (ref 11.5–15.5)
WBC: 12.9 10*3/uL — ABNORMAL HIGH (ref 4.0–10.5)
WBC: 12.3 10*3/uL — ABNORMAL HIGH (ref 4.0–10.5)
nRBC: 0 % (ref 0.0–0.2)
nRBC: 0 % (ref 0.0–0.2)

## 2023-04-16 LAB — COMPREHENSIVE METABOLIC PANEL
ALT: 12 U/L (ref 0–44)
AST: 20 U/L (ref 15–41)
Albumin: 3.5 g/dL (ref 3.5–5.0)
Alkaline Phosphatase: 55 U/L (ref 38–126)
Anion gap: 12 (ref 5–15)
BUN: 21 mg/dL (ref 8–23)
CO2: 20 mmol/L — ABNORMAL LOW (ref 22–32)
Calcium: 8.4 mg/dL — ABNORMAL LOW (ref 8.9–10.3)
Chloride: 103 mmol/L (ref 98–111)
Creatinine, Ser: 1.11 mg/dL (ref 0.61–1.24)
GFR, Estimated: >60 mL/min (ref >60)
Glucose, Bld: 93 mg/dL (ref 70–99)
Potassium: 3.3 mmol/L — ABNORMAL LOW (ref 3.5–5.1)
Sodium: 135 mmol/L (ref 135–145)
Total Bilirubin: 1.3 mg/dL — ABNORMAL HIGH (ref 0.3–1.2)
Total Protein: 6.3 g/dL — ABNORMAL LOW (ref 6.5–8.1)

## 2023-04-16 LAB — POCT I-STAT 7, (LYTES, BLD GAS, ICA,H+H)
Acid-base deficit: 6 mmol/L — ABNORMAL HIGH (ref 0.0–2.0)
Bicarbonate: 20 mmol/L (ref 20.0–28.0)
Calcium, Ion: 1.11 mmol/L — ABNORMAL LOW (ref 1.15–1.40)
HCT: 40 % (ref 39.0–52.0)
Hemoglobin: 13.6 g/dL (ref 13.0–17.0)
O2 Saturation: 99 %
Potassium: 4 mmol/L (ref 3.5–5.1)
Sodium: 140 mmol/L (ref 135–145)
TCO2: 21 mmol/L — ABNORMAL LOW (ref 22–32)
pCO2 arterial: 39.7 mmHg (ref 32–48)
pH, Arterial: 7.31 — ABNORMAL LOW (ref 7.35–7.45)
pO2, Arterial: 150 mmHg — ABNORMAL HIGH (ref 83–108)

## 2023-04-16 LAB — DIFFERENTIAL
Abs Immature Granulocytes: 0.25 10*3/uL — ABNORMAL HIGH (ref 0.00–0.07)
Basophils Absolute: 0.1 10*3/uL (ref 0.0–0.1)
Basophils Relative: 1 %
Eosinophils Absolute: 0.1 10*3/uL (ref 0.0–0.5)
Eosinophils Relative: 1 %
Immature Granulocytes: 2 %
Lymphocytes Relative: 7 %
Lymphs Abs: 0.9 10*3/uL (ref 0.7–4.0)
Monocytes Absolute: 0.7 10*3/uL (ref 0.1–1.0)
Monocytes Relative: 6 %
Neutro Abs: 10.9 10*3/uL — ABNORMAL HIGH (ref 1.7–7.7)
Neutrophils Relative %: 83 %

## 2023-04-16 LAB — I-STAT CHEM 8, ED
BUN: 32 mg/dL — ABNORMAL HIGH (ref 8–23)
Calcium, Ion: 1.02 mmol/L — ABNORMAL LOW (ref 1.15–1.40)
Chloride: 107 mmol/L (ref 98–111)
Creatinine, Ser: 1 mg/dL (ref 0.61–1.24)
Glucose, Bld: 98 mg/dL (ref 70–99)
HCT: 44 % (ref 39.0–52.0)
Hemoglobin: 15 g/dL (ref 13.0–17.0)
Potassium: 5.4 mmol/L — ABNORMAL HIGH (ref 3.5–5.1)
Sodium: 138 mmol/L (ref 135–145)
TCO2: 24 mmol/L (ref 22–32)

## 2023-04-16 LAB — HEMOGLOBIN A1C
Hgb A1c MFr Bld: 5.2 % (ref 4.8–5.6)
Mean Plasma Glucose: 102.54 mg/dL

## 2023-04-16 LAB — ETHANOL: Alcohol, Ethyl (B): <10 mg/dL (ref <10)

## 2023-04-16 LAB — SARS CORONAVIRUS 2 BY RT PCR: SARS Coronavirus 2 by RT PCR: NEGATIVE

## 2023-04-16 LAB — CBG MONITORING, ED: Glucose-Capillary: 103 mg/dL — ABNORMAL HIGH (ref 70–99)

## 2023-04-16 LAB — PROTIME-INR
INR: 1.1 (ref 0.8–1.2)
Prothrombin Time: 14.4 seconds (ref 11.4–15.2)

## 2023-04-16 LAB — APTT: aPTT: 28 seconds (ref 24–36)

## 2023-04-16 LAB — MRSA NEXT GEN BY PCR, NASAL: MRSA by PCR Next Gen: NOT DETECTED

## 2023-04-16 SURGERY — IR WITH ANESTHESIA
Anesthesia: General

## 2023-04-16 MED ORDER — TICAGRELOR 60 MG PO TABS
ORAL_TABLET | ORAL | Status: AC | PRN
  Administered 2023-04-16: 90 mg

## 2023-04-16 MED ORDER — FAMOTIDINE 20 MG PO TABS: 20.0000 mg | ORAL_TABLET | Freq: Two times a day (BID) | ORAL | Status: DC

## 2023-04-16 MED ORDER — ONDANSETRON HCL 4 MG/2ML IJ SOLN
INTRAMUSCULAR | Status: DC | PRN
Start: 2023-04-16 — End: 2023-04-16
  Administered 2023-04-16: 4 mg via INTRAVENOUS

## 2023-04-16 MED ORDER — PHENYLEPHRINE HCL-NACL 20-0.9 MG/250ML-% IV SOLN
INTRAVENOUS | Status: DC | PRN
  Administered 2023-04-16: 30 ug/min via INTRAVENOUS

## 2023-04-16 MED ORDER — CANGRELOR TETRASODIUM 50 MG IV SOLR
INTRAVENOUS | Status: AC
  Filled 2023-04-16: qty 50

## 2023-04-16 MED ORDER — NITROGLYCERIN 1 MG/10 ML FOR IR/CATH LAB
INTRA_ARTERIAL | Status: AC
  Filled 2023-04-16: qty 10

## 2023-04-16 MED ORDER — PHENYLEPHRINE 80 MCG/ML (10ML) SYRINGE FOR IV PUSH (FOR BLOOD PRESSURE SUPPORT)
PREFILLED_SYRINGE | INTRAVENOUS | Status: DC | PRN
  Administered 2023-04-16 (×7): 80 ug via INTRAVENOUS

## 2023-04-16 MED ORDER — ACETAMINOPHEN 325 MG PO TABS
650.0000 mg | ORAL_TABLET | ORAL | Status: DC | PRN
  Administered 2023-04-23 – 2023-04-28 (×2): 650 mg via ORAL
  Filled 2023-04-16 (×2): qty 2

## 2023-04-16 MED ORDER — SODIUM CHLORIDE 0.9 % IV SOLN: INTRAVENOUS | Status: DC | PRN

## 2023-04-16 MED ORDER — LIDOCAINE 2% (20 MG/ML) 5 ML SYRINGE
INTRAMUSCULAR | Status: DC | PRN
  Administered 2023-04-16: 50 mg via INTRAVENOUS

## 2023-04-16 MED ORDER — CEFAZOLIN SODIUM-DEXTROSE 2-4 GM/100ML-% IV SOLN
INTRAVENOUS | Status: AC
  Filled 2023-04-16: qty 100

## 2023-04-16 MED ORDER — SODIUM CHLORIDE 0.9 % IV SOLN: INTRAVENOUS | Status: DC

## 2023-04-16 MED ORDER — ACETAMINOPHEN 160 MG/5ML PO SOLN
650.0000 mg | ORAL | Status: DC | PRN
  Administered 2023-04-24: 650 mg
  Filled 2023-04-16: qty 20.3

## 2023-04-16 MED ORDER — SODIUM CHLORIDE (PF) 0.9 % IJ SOLN
INTRAVENOUS | Status: DC | PRN
  Administered 2023-04-16 (×2): 25 ug via INTRA_ARTERIAL

## 2023-04-16 MED ORDER — ENOXAPARIN SODIUM 40 MG/0.4ML IJ SOSY: 40.0000 mg | PREFILLED_SYRINGE | Freq: Every day | INTRAMUSCULAR | Status: DC

## 2023-04-16 MED ORDER — ACETAMINOPHEN 650 MG RE SUPP: 650.0000 mg | RECTAL | Status: DC | PRN

## 2023-04-16 MED ORDER — PROPOFOL 10 MG/ML IV BOLUS
INTRAVENOUS | Status: DC | PRN
  Administered 2023-04-16: 130 mg via INTRAVENOUS
  Administered 2023-04-16 (×2): 20 mg via INTRAVENOUS

## 2023-04-16 MED ORDER — IOHEXOL 350 MG/ML SOLN
100.0000 mL | Freq: Once | INTRAVENOUS | Status: AC | PRN
  Administered 2023-04-16: 100 mL via INTRAVENOUS

## 2023-04-16 MED ORDER — FENTANYL CITRATE (PF) 100 MCG/2ML IJ SOLN
INTRAMUSCULAR | Status: DC | PRN
  Administered 2023-04-16 (×2): 50 ug via INTRAVENOUS

## 2023-04-16 MED ORDER — SUCCINYLCHOLINE CHLORIDE 200 MG/10ML IV SOSY
PREFILLED_SYRINGE | INTRAVENOUS | Status: DC | PRN
  Administered 2023-04-16: 120 mg via INTRAVENOUS

## 2023-04-16 MED ORDER — ORAL CARE MOUTH RINSE
15.0000 mL | OROMUCOSAL | Status: DC
  Administered 2023-04-16 – 2023-04-17 (×6): 15 mL via OROMUCOSAL

## 2023-04-16 MED ORDER — CLEVIDIPINE BUTYRATE 0.5 MG/ML IV EMUL: 0.0000 mg/h | INTRAVENOUS | Status: DC

## 2023-04-16 MED ORDER — ASPIRIN 81 MG PO CHEW
81.0000 mg | CHEWABLE_TABLET | Freq: Every day | ORAL | Status: DC
  Administered 2023-04-21 – 2023-04-29 (×9): 81 mg via ORAL
  Filled 2023-04-16 (×10): qty 1

## 2023-04-16 MED ORDER — TICAGRELOR 90 MG PO TABS
ORAL_TABLET | ORAL | Status: AC
  Filled 2023-04-16: qty 1

## 2023-04-16 MED ORDER — CEFAZOLIN SODIUM-DEXTROSE 2-3 GM-%(50ML) IV SOLR
INTRAVENOUS | Status: DC | PRN
  Administered 2023-04-16: 2 g via INTRAVENOUS

## 2023-04-16 MED ORDER — PANTOPRAZOLE SODIUM 40 MG IV SOLR
40.0000 mg | Freq: Every day | INTRAVENOUS | Status: DC
  Administered 2023-04-16: 40 mg via INTRAVENOUS
  Filled 2023-04-16: qty 10

## 2023-04-16 MED ORDER — ASPIRIN 81 MG PO TBEC
DELAYED_RELEASE_TABLET | ORAL | Status: AC | PRN
  Administered 2023-04-16: 81 mg via ORAL

## 2023-04-16 MED ORDER — DEXAMETHASONE SODIUM PHOSPHATE 10 MG/ML IJ SOLN
INTRAMUSCULAR | Status: DC | PRN
  Administered 2023-04-16: 10 mg via INTRAVENOUS

## 2023-04-16 MED ORDER — ACETAMINOPHEN 325 MG PO TABS: 650.0000 mg | ORAL_TABLET | ORAL | Status: DC | PRN

## 2023-04-16 MED ORDER — ROCURONIUM BROMIDE 10 MG/ML (PF) SYRINGE
PREFILLED_SYRINGE | INTRAVENOUS | Status: DC | PRN
  Administered 2023-04-16 (×3): 20 mg via INTRAVENOUS
  Administered 2023-04-16: 40 mg via INTRAVENOUS

## 2023-04-16 MED ORDER — DOCUSATE SODIUM 50 MG/5ML PO LIQD
100.0000 mg | Freq: Two times a day (BID) | ORAL | Status: DC
  Filled 2023-04-16: qty 10

## 2023-04-16 MED ORDER — CHLORHEXIDINE GLUCONATE CLOTH 2 % EX PADS
6.0000 | MEDICATED_PAD | Freq: Every day | CUTANEOUS | Status: DC
  Administered 2023-04-16 – 2023-04-17 (×2): 6 via TOPICAL

## 2023-04-16 MED ORDER — SODIUM CHLORIDE 0.9% FLUSH: 3.0000 mL | Freq: Once | INTRAVENOUS | Status: DC

## 2023-04-16 MED ORDER — SODIUM CHLORIDE 0.9 % IV SOLN: 250.0000 mL | INTRAVENOUS | Status: DC

## 2023-04-16 MED ORDER — ENOXAPARIN SODIUM 40 MG/0.4ML IJ SOSY
40.0000 mg | PREFILLED_SYRINGE | Freq: Every day | INTRAMUSCULAR | Status: DC
  Administered 2023-04-17 – 2023-05-02 (×16): 40 mg via SUBCUTANEOUS
  Filled 2023-04-16 (×16): qty 0.4

## 2023-04-16 MED ORDER — IOHEXOL 300 MG/ML  SOLN
150.0000 mL | Freq: Once | INTRAMUSCULAR | Status: AC | PRN
  Administered 2023-04-16: 100 mL via INTRA_ARTERIAL

## 2023-04-16 MED ORDER — ASPIRIN 81 MG PO CHEW
81.0000 mg | CHEWABLE_TABLET | Freq: Every day | ORAL | Status: DC
  Administered 2023-04-17 – 2023-05-02 (×6): 81 mg
  Filled 2023-04-16 (×6): qty 1

## 2023-04-16 MED ORDER — FENTANYL CITRATE PF 50 MCG/ML IJ SOSY: 25.0000 ug | PREFILLED_SYRINGE | INTRAMUSCULAR | Status: DC | PRN

## 2023-04-16 MED ORDER — CLEVIDIPINE BUTYRATE 0.5 MG/ML IV EMUL
INTRAVENOUS | Status: AC
  Filled 2023-04-16: qty 50

## 2023-04-16 MED ORDER — IOHEXOL 300 MG/ML  SOLN
50.0000 mL | Freq: Once | INTRAMUSCULAR | Status: AC | PRN
  Administered 2023-04-16: 26 mL via INTRAVENOUS

## 2023-04-16 MED ORDER — PHENYLEPHRINE HCL-NACL 20-0.9 MG/250ML-% IV SOLN: 25.0000 ug/min | INTRAVENOUS | Status: DC

## 2023-04-16 MED ORDER — ACETAMINOPHEN 160 MG/5ML PO SOLN: 650.0000 mg | ORAL | Status: DC | PRN

## 2023-04-16 MED ORDER — SODIUM CHLORIDE 0.9 % IV SOLN
INTRAVENOUS | Status: AC | PRN
  Administered 2023-04-16: 2 ug/kg/min via INTRAVENOUS

## 2023-04-16 MED ORDER — MIDAZOLAM HCL 2 MG/2ML IJ SOLN: 1.0000 mg | INTRAMUSCULAR | Status: DC | PRN

## 2023-04-16 MED ORDER — POTASSIUM CHLORIDE 20 MEQ PO PACK
40.0000 meq | PACK | Freq: Once | ORAL | Status: AC
  Administered 2023-04-16: 40 meq
  Filled 2023-04-16: qty 2

## 2023-04-16 MED ORDER — ORAL CARE MOUTH RINSE: 15.0000 mL | OROMUCOSAL | Status: DC | PRN

## 2023-04-16 MED ORDER — CLEVIDIPINE BUTYRATE 0.5 MG/ML IV EMUL
0.0000 mg/h | INTRAVENOUS | Status: DC
  Administered 2023-04-16: 6 mg/h via INTRAVENOUS
  Administered 2023-04-16: 8 mg/h via INTRAVENOUS
  Administered 2023-04-16 – 2023-04-17 (×2): 4 mg/h via INTRAVENOUS
  Administered 2023-04-17: 6 mg/h via INTRAVENOUS
  Filled 2023-04-16: qty 100
  Filled 2023-04-16 (×3): qty 50

## 2023-04-16 MED ORDER — ASPIRIN 81 MG PO CHEW
CHEWABLE_TABLET | ORAL | Status: AC
  Filled 2023-04-16: qty 1

## 2023-04-16 MED ORDER — SENNOSIDES-DOCUSATE SODIUM 8.6-50 MG PO TABS: 1.0000 | ORAL_TABLET | Freq: Every evening | ORAL | Status: DC | PRN

## 2023-04-16 MED ORDER — FENTANYL CITRATE (PF) 100 MCG/2ML IJ SOLN
INTRAMUSCULAR | Status: AC
  Filled 2023-04-16: qty 2

## 2023-04-16 MED ORDER — TICAGRELOR 90 MG PO TABS
90.0000 mg | ORAL_TABLET | Freq: Two times a day (BID) | ORAL | Status: DC
  Administered 2023-04-16 – 2023-04-19 (×6): 90 mg
  Filled 2023-04-16 (×10): qty 1

## 2023-04-16 MED ORDER — POLYETHYLENE GLYCOL 3350 17 G PO PACK: 17.0000 g | PACK | Freq: Every day | ORAL | Status: DC

## 2023-04-16 MED ORDER — TICAGRELOR 90 MG PO TABS
90.0000 mg | ORAL_TABLET | Freq: Two times a day (BID) | ORAL | Status: DC
  Administered 2023-04-21 – 2023-04-28 (×15): 90 mg via ORAL
  Filled 2023-04-16 (×14): qty 1

## 2023-04-16 MED ORDER — PROPOFOL 1000 MG/100ML IV EMUL: 0.0000 ug/kg/min | INTRAVENOUS | Status: DC

## 2023-04-16 MED ORDER — PHENYLEPHRINE HCL-NACL 20-0.9 MG/250ML-% IV SOLN: 0.0000 ug/min | INTRAVENOUS | Status: DC

## 2023-04-16 MED ORDER — CANGRELOR BOLUS VIA INFUSION
INTRAVENOUS | Status: AC | PRN
  Administered 2023-04-16: 1146 ug via INTRAVENOUS

## 2023-04-16 MED ORDER — STROKE: EARLY STAGES OF RECOVERY BOOK
Freq: Once | Status: AC
  Filled 2023-04-16: qty 1

## 2023-04-16 NOTE — Anesthesia Preprocedure Evaluation (Addendum)
Anesthesia Evaluation  Patient identified by MRN, date of birth, ID band Patient unresponsive    Reviewed: Unable to perform ROS - Chart review onlyPreop documentation limited or incomplete due to emergent nature of procedure.  Airway Mallampati: Unable to assess       Dental   Pulmonary former smoker   breath sounds clear to auscultation       Cardiovascular hypertension,  Rhythm:Regular  1. Left ventricular ejection fraction, by estimation, is 35 to 40%. The  left ventricle has moderately decreased function. The left ventricle  demonstrates global hypokinesis. There is severe concentric left  ventricular hypertrophy. Left ventricular  diastolic function could not be evaluated.   2. Right ventricular systolic function is normal. The right ventricular  size is normal.   3. A small pericardial effusion is present. The pericardial effusion is  anterior to the right ventricle. There is no evidence of cardiac  tamponade.   4. The mitral valve is normal in structure. No evidence of mitral valve  regurgitation. No evidence of mitral stenosis.   5. The aortic valve is normal in structure. Aortic valve regurgitation is  not visualized. No aortic stenosis is present.   6. 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   7. The inferior vena cava is normal in size with greater than 50%  respiratory variability, suggesting right atrial pressure of 3 mmHg.     Neuro/Psych CVA, Residual Symptoms    GI/Hepatic hiatal hernia,GERD  ,,  Endo/Other  Lab Results      Component                Value               Date                      HGBA1C                   5.1                 01/16/2022             Renal/GU Lab Results      Component                Value               Date                      NA                       138                 04/16/2023                K                         5.4 (H)             04/16/2023                CO2                      23                  02/11/2023  GLUCOSE                  98                  04/16/2023                BUN                      32 (H)              04/16/2023                CREATININE               1.00                04/16/2023                CALCIUM                  9.1                 02/11/2023                GFRNONAA                 >60                 02/11/2023                Musculoskeletal   Abdominal   Peds  Hematology Lab Results      Component                Value               Date                      WBC                      12.9 (H)            04/16/2023                HGB                      15.0                04/16/2023                HCT                      44.0                04/16/2023                MCV                      89.4                04/16/2023                PLT                      287                 04/16/2023              Anesthesia Other Findings   Reproductive/Obstetrics  Anesthesia Physical Anesthesia Plan  ASA: 4 and emergent  Anesthesia Plan: General   Post-op Pain Management: Minimal or no pain anticipated   Induction: Intravenous, Rapid sequence and Cricoid pressure planned  PONV Risk Score and Plan: 2 and Dexamethasone and Ondansetron  Airway Management Planned: Oral ETT  Additional Equipment: Arterial line  Intra-op Plan:   Post-operative Plan: Possible Post-op intubation/ventilation  Informed Consent:      History available from chart only and Only emergency history available  Plan Discussed with: CRNA and Anesthesiologist  Anesthesia Plan Comments:        Anesthesia Quick Evaluation

## 2023-04-16 NOTE — H&P (Signed)
NEUROLOGY CONSULTATION NOTE   Date of service: April 16, 2023 Patient Name: Casey Rangel MRN:  [id number] DOB:  [birth year]  _ _ _   _ __   _ __ _ _  __ __   _ __   __ _  History of Present Illness  Casey Rangel is a 72 y.o. 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  Patient is unable to provide any history secondary to aphasia.  Sister who lives with the patient reports that she is last saw him at his baseline at 22 PM.  This morning, she found him on the floor next to the bed.  He had soiled himself and he was not responding to her questions.  She called EMS who brought him as a code stroke.  Sister reports that Casey Rangel showed improvement from his stroke last year and he was moving his right side and had recovered some of his language.  He was hard of hearing which compounded his residual aphasia after stroke.  He was able to express his needs, he was able to understand some language.  At baseline, after his stroke last year, Casey Rangel was able to walk on his own without assistance, was able to take shower, cleaning up after himself.  He needed help with food, groceries, finances.  Sister would help him out.  Despite her repeated request for Casey Rangel to take his medications, Casey Rangel would rarely take his medications.  Sister reports that back in June, patient had a fall and in that setting, he was taken to the hospital where he was found to have some bleeding around his brain.  On reviewing notes from 626/24, it appears that he was sent to [facility] ED where CT head demonstrated bilateral subdural hematomas.  On arrival to the ED, a CT head without contrast was obtained which demonstrated in aspects of 9 and concern for a developing left MCA stroke.  CT angio demonstrated occlusion of  proximal left MCA M2.  The occluded vessel does appear pretty significant in caliber.  CT perfusion demonstrated a core of 45 cc and a penumbra of 45 cc.  LKW: 2100 on 04/15/2023. mRS: 3 tNKASE: Not offered at he is outside of window and has a hx of bilateral subdural hematomas. Thrombectomy: Pt presents with disabling Rue and RLE plegia with L gaze, R hemanopsia, global aphasia. Given the disabling nature of his deficit, both me and Dr. Corliss Skains discussed potential thrombectomy with patient's sister. She understands that there is already concern for some damage in the noted area of stroke. She understands that thrombectomy is high risk given his hx of BL SDH. She understands that his baseline is pretty poor and he could potentially decline further with this new stroke. She understands that there is 10-15% risk of hemorrhage. She understands the details of procedure, along with risks, benefits. There are no alternatives except supportive care. She expressed verbal understanding of thrombectomy and consented for thrombectomy. NIHSS components Score: Comment  1a Level of Conscious 0[x]  1[]  2[]  3[]      1b LOC Questions 0[]  1[]  2[x]       1c LOC Commands 0[]  1[]  2[x]       2 Best Gaze 0[]  1[]  2[x]       3 Visual 0[]  1[]  2[x]  3[]      4 Facial  Palsy 0[]  1[]  2[x]  3[]      5a Motor Arm - left 0[x]  1[]  2[]  3[]  4[]  UN[]    5b Motor Arm - Right 0[]  1[]  2[]  3[]  4[x]  UN[]    6a Motor Leg - Left 0[]  1[]  2[x]  3[]  4[]  UN[]    6b Motor Leg - Right 0[]  1[]  2[]  3[]  4[x]  UN[]    7 Limb Ataxia 0[x]  1[]  2[]  3[]  UN[]     8 Sensory 0[]  1[]  2[x]  UN[]      9 Best Language 0[]  1[]  2[]  3[x]      10 Dysarthria 0[]  1[]  2[x]  UN[]      11 Extinct. and Inattention 0[x]  1[]  2[]       TOTAL: 27      ROS   Unable to obtain review of system with dense aphasia.  Past History   Past Medical History:  Diagnosis Date   GERD (gastroesophageal reflux disease)    Hiatal hernia    Stroke [facility])    Past Surgical History:  Procedure  Laterality Date   INTRAMEDULLARY (IM) NAIL INTERTROCHANTERIC Left 10/17/2020   Procedure: INTRAMEDULLARY (IM) NAIL INTERTROCHANTRIC;  Surgeon: Kennedy Bucker, MD;  Location: [facility] ORS;  Service: Orthopedics;  Laterality: Left;   IR CT HEAD LTD  01/15/2022   IR PERCUTANEOUS ART THROMBECTOMY/INFUSION INTRACRANIAL INC DIAG ANGIO  01/15/2022   IR PTA INTRACRANIAL  01/15/2022   IR US GUIDE VASC ACCESS RIGHT  01/15/2022   RADIOLOGY WITH ANESTHESIA N/A 01/15/2022   Procedure: RADIOLOGY WITH ANESTHESIA;  Surgeon: Julieanne Cotton, MD;  Location: [facility] OR;  Service: Radiology;  Laterality: N/A;   T9-T11 fusion in 1990     Family History  Problem Relation Age of Onset   Hearing loss Mother    Liver disease Brother    Hearing loss Paternal Grandmother    Social History   Socioeconomic History   Marital status: Single    Spouse name: Not on file   Number of children: 0   Years of education: 16   Highest education level: Not on file  Occupational History   Not on file  Tobacco Use   Smoking status: Former    Current packs/day: 0.00    Average packs/day: 1.5 packs/day for 30.0 years (45.0 ttl pk-yrs)    Types: Cigarettes    Start date: 75    Quit date: 2001    Years since quitting: 23.6   Smokeless tobacco: Never  Substance and Sexual Activity   Alcohol use: Not Currently   Drug use: Never   Sexual activity: Not Currently  Other Topics Concern   Not on file  Social History Narrative   Right handed   Yes caffeine   One story home   Social Determinants of Health   Financial Resource Strain: Not on file  Food Insecurity: No Food Insecurity (04/02/2023)   Hunger Vital Sign    Worried About Running Out of Food in the Last Year: Never true    Ran Out of Food in the Last Year: Never true  Transportation Needs: No Transportation Needs (04/02/2023)   PRAPARE - Administrator, Civil Service (Medical): No    Lack of Transportation (Non-Medical): No  Physical Activity: Inactive  (03/12/2022)   Exercise Vital Sign    Days of Exercise per Week: 0 days    Minutes of Exercise per Session: 0 min  Stress: Not on file  Social Connections: Socially Isolated (03/12/2022)   Social Connection and Isolation Panel [NHANES]    Frequency of Communication with Friends and Family: Never  Frequency of Social Gatherings with Friends and Family: Never    Attends Religious Services: Never    Database administrator or Organizations: No    Attends Banker Meetings: Never    Marital Status: Never married   Allergies  Allergen Reactions   Penicillins Other (See Comments)    Unknown     Medications  (Not in a hospital admission)    Vitals   Vitals:   04/16/23 1000  Weight: 76.4 kg     Body mass index is 24.17 kg/m.  Physical Exam   General: Laying comfortably in bed; appears disheveled and appears to have soiled himself. HENT: Normal oropharynx and mucosa. Normal external appearance of ears and nose.  Neck: Supple, no pain or tenderness  CV: No JVD. No peripheral edema.  Pulmonary: Symmetric Chest rise. Normal respiratory effort.  Abdomen: Soft to touch, non-tender.  Ext: No cyanosis, edema, or deformity  Skin: No rash. Normal palpation of skin.   Musculoskeletal: Normal digits and nails by inspection. No clubbing.   Neurologic Examination  Mental status/Cognition: Alert, makes eye contact on the left,, does not answer any orientation questions.  At times, appears to try to mimic visual cues. Speech/language: Mute, no speech, does not follow commands or answer questions or identify objects.  He.  Does seem to try to mimic visual cues. cranial nerves:   CN II Pupils equal and reactive to light, not blink to threat on the right.   CN III,IV,VI Left gaze deviation, does not cross midline.   CN V Unable to assess.   CN VII Right facial droop.   CN VIII Makes eye contact and speech on the left.   CN IX & X Seems to be protecting his airway.   CN XI Head  to the left   CN XII midline tongue but does not protrude on command.   Motor:  Muscle bulk: poor, tone flaccid in RUE and RLE. No movement noted in right upper extremity right lower extremity to any noxious to light. Holds left upper extremity off the bed for more than 10 seconds without any drift. Some antigravity movement noted in left lower extremity.  Sensation:  Light touch    Pin prick Localized to proximal plantar left upper extremity left lower extremity.  No response to touch in right upper extremity left lower extremity.   Temperature    Vibration   Proprioception    Coordination/Complex Motor:  Unable to assess. Labs   CBC:  Recent Labs  Lab 04/16/23 0958 04/16/23 1006  WBC 12.9*  --   NEUTROABS 10.9*  --   HGB 15.5 15.0  HCT 45.4 44.0  MCV 89.4  --   PLT 287  --     Basic Metabolic Panel:  Lab Results  Component Value Date   NA 138 04/16/2023   K 5.4 (H) 04/16/2023   CO2 23 02/11/2023   GLUCOSE 98 04/16/2023   BUN 32 (H) 04/16/2023   CREATININE 1.00 04/16/2023   CALCIUM 9.1 02/11/2023   GFRNONAA >60 02/11/2023   Lipid Panel:  Lab Results  Component Value Date   LDLCALC 129 (H) 01/16/2022   HgbA1c:  Lab Results  Component Value Date   HGBA1C 5.1 01/16/2022   Urine Drug Screen:     Component Value Date/Time   LABOPIA NONE DETECTED 01/15/2022 1908   COCAINSCRNUR NONE DETECTED 01/15/2022 1908   LABBENZ NONE DETECTED 01/15/2022 1908   AMPHETMU NONE DETECTED 01/15/2022 1908   THCU NONE  DETECTED 01/15/2022 1908   LABBARB NONE DETECTED 01/15/2022 1908    Alcohol Level     Component Value Date/Time   ETH <10 01/15/2022 1930    CT Head without contrast(Personally reviewed): 1. Acute left MCA territory infarct involving the left insula. ASPECTS is 9. 2. 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 3. Background parenchymal atrophy, chronic small  vessel ischemic disease and chronic infarcts as described. 4. Paranasal sinus disease as described.  CT angio Head and Neck with contrast(Personally reviewed): Occlusion of proximal left MCA M2.  MRI Brain(Personally reviewed): Pending  Impression   Casey Rangel is a 72 y.o. 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  He was found to have left MCA proximal M2 occlusion with 45 cc penumbra and 45 cc of core.  I suspect the cord was probably overestimated with his prior left MCA infarct. Thrombectomy and significant associated risk of hemorrhage was extensively discussed with patient's sister and agreed to thrombectomy. He was outside window for tnkase and has hx of SDH and therefore not a candidate for tnakse.  Recommendations  Acute L MCA stroke with L MCA M2 occlusion: - Frequent Neuro checks per stroke unit protocol - Recommend brain imaging with MRI Brain without contrast - Recommend obtaining TTE - Recommend obtaining Lipid panel with LDL - Please start statin if LDL > 70 - Recommend HbA1c to evaluate for diabetes and how well it is controlled. - Antithrombotic - per Neuro IR for the first 24 hours of intervention. - Recommend DVT ppx - SBP goal -per neuro IR for the first 24 hours after admission. - Recommend Telemetry monitoring for arrythmia - Recommend bedside swallow screen prior to PO intake. - Stroke education booklet - Recommend PT/OT/SLP consult  Cardiomyopathy 2D echo pending   Hypertension BP goal per Neuro IR for the first 24 hours.   Hyperlipidemia Home meds:  None LDL pending. Start Statin if LDL > 70  Depression: - cont home Seroquel.  Verify allergies with patient's sister and patient is allergic to penicillins.  Also discussed with sister about patient's CODE  STATUS and patient is full code.  She is okay with patient being on a ventilator short-term but not long-term.  This patient is critically ill and at significant risk of neurological worsening, death and care requires constant monitoring of vital signs, hemodynamics,respiratory and cardiac monitoring, neurological assessment, discussion with family, other specialists and medical decision making of high complexity. I spent 100 minutes of neurocritical care time  in the care of  this patient. This was time spent independent of any time provided by nurse practitioner or PA.  Erick Blinks [facility] 04/16/2023  11:30 AM   ______________________________________________________________________   Thank you for the opportunity to take part in the care of this patient. If you have any further questions, please contact the neurology consultation attending.  Signed,  Erick Blinks [facility] _ _ _   _ __   _ __ _ _  __ __   _ __   __ _

## 2023-04-16 NOTE — Sedation Documentation (Signed)
Angioseal deployed 8 F

## 2023-04-16 NOTE — Transfer of Care (Signed)
Immediate Anesthesia Transfer of Care Note  Patient: Casey Rangel  Procedure(s) Performed: IR WITH ANESTHESIA  Patient Location: ICU  Anesthesia Type:General  Level of Consciousness: sedated  Airway & Oxygen Therapy: Patient remains intubated per anesthesia plan  Post-op Assessment: Report given to RN and Post -op Vital signs reviewed and stable  Post vital signs: Reviewed and stable  Last Vitals:  Vitals Value Taken Time  BP    Temp    Pulse 61 04/16/23 1359  Resp 16 04/16/23 1359  SpO2 100 % 04/16/23 1359  Vitals shown include unfiled device data.  Last Pain:  Vitals:   04/16/23 1058  TempSrc:   PainSc: 0-No pain         Complications: No notable events documented.

## 2023-04-16 NOTE — Procedures (Signed)
INR.   Status post left common carotid arteriogram.  Right CFA approach.  Findings.  1.  Occluded distal M1 segment extending into the superior and inferior divisions.  Status post revascularization of occluded left MCA distally and the proximal branches with 1 pass with a 4 mm x 40 mm solitaire X retriever device and contact aspiration achieving a TICI 3 revascularization.  Reocclusion due to underlying arteriosclerosis with recanalization with a 3 mm x 40 mm ultra solitaire X retriever device with contact aspiration Progressive reocclusion  with in the stent requiring  a balloon angioplasty regaining TICI 3 revascularization.  Post CT brain shows contrast stain of the post Lt putamen. 15F angioseal used for hemostasis at the RT groin puncture site. Distal pulses dopplerable unchanged. Patient left intubated as per anesthesia.  Iv cangrelor bolus followed by a 4 hr infusion to stop at 4.30 pm followed by CT brain (ordered)  S.Malakhi Markwood MD

## 2023-04-16 NOTE — Code Documentation (Signed)
Stroke Response Nurse Documentation Code Documentation  Casey Rangel is a 72 y.o. male arriving to [facility]  via [facility] on 8/29 with past medical hx of htn, heart failure, hld, vascular dementia, CVA. On No antithrombotic. Code stroke was activated by EMS.   Patient from home where he was LKW at 2100 on 8/27 and now complaining of altered mental status, R sided weakness. Patient found this morning on the floor next to the bed, having soiled himself and was confused and not responding to her questions.    Stroke team at the bedside on patient arrival. Labs drawn and patient cleared for CT by Dr. Theresia Lo. Patient to CT with team. NIHSS 27, see documentation for details and code stroke times. Patient with disoriented, not following commands, left gaze preference , right hemianopia, right arm weakness, bilateral leg weakness, right decreased sensation, Global aphasia , dysarthria , and right neglect on exam. The following imaging was completed:  CT Head, CTA, and CTP. Patient is not a candidate for IV Thrombolytic due to being out of the treatment window. Patient is a candidate for IR due to MCA M2 occlusion.   Care Plan: IR then 4N ICU for post-thrombectomy care .   Bedside handoff with IR RN.    Pearlie Oyster  Stroke Response RN

## 2023-04-16 NOTE — Patient Instructions (Signed)
Visit Information  Thank you for taking time to visit with me today. Please don't hesitate to contact me if I can be of assistance to you.   Following are the goals we discussed today:  Patient's sister encouraged to discuss anticipated discharge needs and plans with inpatient case management team Patient's sister to contact this social worker with any additional community resource needs   Our next appointment is by telephone on 04/23/23 at 1pm  Please call the care guide team at [phone] if you need to cancel or reschedule your appointment.   If you are experiencing a Mental Health or Behavioral Health Crisis or need someone to talk to, please call [phone]   Patient verbalizes understanding of instructions and care plan provided today and agrees to view in MyChart. Active MyChart status and patient understanding of how to access instructions and care plan via MyChart confirmed with patient.     Telephone follow up appointment with care management team member scheduled for: 04/23/23  Verna Czech, LCSW Clinical Social Worker  [phone]

## 2023-04-16 NOTE — Consult Note (Signed)
NAME:  Casey Rangel, MRN:  [id number], DOB:  1951/02/01, LOS: 0 ADMISSION DATE:  04/16/2023, CONSULTATION DATE: 8/29 REFERRING MD: Dr. 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  After his previous stroke he did recover some movement of his right side and some language.  He was able to walk, shower, and clean up after himself without assistance.  He was found by his sister to have right-sided weakness, aphasia, and leftward gaze deviation.  Last known well 9 PM on 8/28.  CT scan of the ED showed concern for developing left MCA stroke.  CT angiogram showed occlusion of the proximal left MCA M2.  The patient was outside window for thrombolytics but was taken to interventional radiology for endovascular procedure.  Initial revascularization of the left MCA with 1 pass and achieving TICI 3, however, the vessel reoccluded.  This happened 2 additional times and ultimately stent was placed.  Due to poor mental status prior to intubation he was left on the mechanical ventilator postoperatively and was transferred to the ICU for ongoing care.  Pertinent  Medical History   has a past medical history of GERD (gastroesophageal reflux disease), Hiatal hernia, and Stroke (HCC).   Significant Hospital Events: Including procedures, antibiotic start and stop dates in addition to other pertinent events   CT head 8/29 > Acute left MCA territory infarct involving the left insula, trace chronic subdural hematoma  CTA head 8/29 >  Acute occlusion of a proximal M2 left middle cerebral  artery vessel (at its origin). Redemonstrated moderate stenosis within the left middle cerebral artery mid M1 segment.  Interim History / Subjective:    Objective   Blood pressure (!) 160/104, pulse (!) 110, temperature 97.9 F (36.6 C), temperature source Oral, resp. rate 16, height 5\' 10"  (1.778 m), weight 76.4 kg.    Vent Mode: PRVC FiO2 (%):  [60 %] 60 % Set Rate:  [16 bmp] 16 bmp Vt Set:  [580 mL] 580 mL PEEP:  [5 cmH20] 5 cmH20 Plateau Pressure:  [16 cmH20] 16 cmH20   Intake/Output Summary (Last 24 hours) at 04/16/2023 1406 Last data filed at 04/16/2023 1340 Gross per 24 hour  Intake 850 ml  Output --  Net 850 ml   Filed Weights   04/16/23 1000  Weight: 76.4 kg    Examination: General: Frail elderly, poorly kempt male in no acute distress on ventilator HENT: Normocephalic, atraumatic Lungs: Clear bilateral breath sounds Cardiovascular: Regular rate and rhythm, no MRG Abdomen: Soft, nondistended Extremities: No acute deformities or range of motion imitations.  +1 pedal pulses bilaterally Neuro: Off prop 15 mins. No eye opening, does not follow commands, + cough. 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  Status post endovascular revascularization and stent placement on 8/29. Prior stroke 12/2021 left ICA with residual R weakness and aphasia  -Management per neurology/stroke service -Systolic blood pressure goal 120 to 140 mmHg -Cleviprex for BP goal -Cangrelor infusion until 4pm at which point CT head will be repeated.  -Echocardiogram -Lipid  panel, HgbA1C  Acute respiratory failure with hypoxia: due to obtundation prior to intubation  - Full vent support - CXR/ABG now - VAP bundle - Assess for SBT in AM - Continue propofol, PRN fentanyl for RASS goal 0 to -1.   Chronic HFrEF - Echo pending - Telemetry monitoring - Holding ASA, coreg, entresto for now  Leukocytosis:  reactive to CVA vs aspiration.  -Hold antibiotics for now -Trend WBC and fever curve - Check CXR, UA, PCT   Alzheimer's dementia - holding Aricept and Seroquel for now  Hypokalemia - Give K  Goals of care: Discussed with Bill's sister [name]. She tells me there has been a steady decline since his fall and subdural back in June. Quality of life was rather poor prior to this. She is hopeful he will improve quickly, but he would not want to be maintained on life support for any extended period of time. DNR as well.     Best Practice (right click and "Reselect all SmartList Selections" daily)   Diet/type: NPO DVT prophylaxis: LMWH GI prophylaxis: PPI Lines: Arterial Line Foley:  Yes, and it is still needed Code Status:  DNR Last date of multidisciplinary goals of care discussion [ ]   Labs   CBC: Recent Labs  Lab 04/16/23 0958 04/16/23 1006 04/16/23 1100  WBC 12.9*  --  12.3*  NEUTROABS 10.9*  --   --   HGB 15.5 15.0 15.2  HCT 45.4 44.0 44.5  MCV 89.4  --  89.5  PLT 287  --  259    Basic Metabolic Panel: Recent Labs  Lab 04/16/23 1006 04/16/23 1100  NA 138 135  K 5.4* 3.3*  CL 107 103  CO2  --  20*  GLUCOSE 98 93  BUN 32* 21  CREATININE 1.00 1.11  CALCIUM  --  8.4*   GFR: Estimated Creatinine Clearance: 63 mL/min (by C-G formula based on SCr of 1.11 mg/dL). Recent Labs  Lab 04/16/23 0958 04/16/23 1100  WBC 12.9* 12.3*    Liver Function Tests: Recent Labs  Lab 04/16/23 1100  AST 20  ALT 12  ALKPHOS 55  BILITOT 1.3*  PROT 6.3*  ALBUMIN 3.5   No results for input(s): "LIPASE", "AMYLASE" in the last 168 hours. No results for input(s): "AMMONIA" in the last 168 hours.  ABG    Component Value Date/Time   HCO3 26.6 03/10/2022 1316   TCO2 24 04/16/2023 1006   O2SAT 71 03/10/2022 1316     Coagulation Profile: Recent Labs  Lab 04/16/23 1100  INR 1.1    Cardiac Enzymes: No results for input(s): "CKTOTAL", "CKMB", "CKMBINDEX", "TROPONINI"  in the last 168 hours.  HbA1C: Hgb A1c MFr Bld  Date/Time Value Ref Range Status  04/16/2023 11:00 AM 5.2 4.8 - 5.6 % Final    Comment:    (NOTE) Pre diabetes:          5.7%-6.4%  Diabetes:              >6.4%  Glycemic control for   <7.0% adults with diabetes   01/16/2022 04:49 AM 5.1 4.8 - 5.6 % Final    Comment:    (NOTE) Pre diabetes:          5.7%-6.4%  Diabetes:              >6.4%  Glycemic control for   <7.0% adults with diabetes     CBG: Recent Labs  Lab 04/16/23 0958  GLUCAP 103*    Review of  Systems:   Patient is encephalopathic and/or intubated; therefore, history has been obtained from chart review.    Past Medical History:  He,  has a past medical history of GERD (gastroesophageal reflux disease), Hiatal hernia, and Stroke (HCC).   Surgical History:   Past Surgical History:  Procedure Laterality Date   INTRAMEDULLARY (IM) NAIL INTERTROCHANTERIC Left 10/17/2020   Procedure: INTRAMEDULLARY (IM) NAIL INTERTROCHANTRIC;  Surgeon: Kennedy Bucker, MD;  Location: [facility] ORS;  Service: Orthopedics;  Laterality: Left;   IR CT HEAD LTD  01/15/2022   IR PERCUTANEOUS ART THROMBECTOMY/INFUSION INTRACRANIAL INC DIAG ANGIO  01/15/2022   IR PTA INTRACRANIAL  01/15/2022   IR US GUIDE VASC ACCESS RIGHT  01/15/2022   RADIOLOGY WITH ANESTHESIA N/A 01/15/2022   Procedure: RADIOLOGY WITH ANESTHESIA;  Surgeon: Julieanne Cotton, MD;  Location: [facility] OR;  Service: Radiology;  Laterality: N/A;   T9-T11 fusion in 1990       Social History:   reports that he quit smoking about 23 years ago. His smoking use included cigarettes. He started smoking about 53 years ago. He has a 45 pack-year smoking history. He has never used smokeless tobacco. He reports that he does not currently use alcohol. He reports that he does not use drugs.   Family History:  His family history includes Hearing loss in his mother and paternal grandmother; Liver disease in his brother.   Allergies Allergies   Allergen Reactions   Penicillins Other (See Comments)    Unknown      Home Medications  Prior to Admission medications   Medication Sig Start Date End Date Taking? Authorizing Provider  aspirin EC 81 MG tablet Take 1 tablet (81 mg total) by mouth daily. Swallow whole. 01/22/22   de Saintclair Halsted, Cortney E, NP  carvedilol (COREG) 3.125 MG tablet Take 1 tablet (3.125 mg total) by mouth 2 (two) times daily with a meal. Patient not taking: Reported on 03/11/2022 01/21/22   de Saintclair Halsted, Cortney E, NP  donepezil (ARICEPT) 10 MG tablet Take half tablet (5 mg) daily for 2 weeks, then increase to the full tablet at 10 mg daily 03/20/22   Marcos Eke, PA-C  QUEtiapine (SEROQUEL) 25 MG tablet Take 1 tablet (25 mg total) by mouth at bedtime. 06/06/22   Wertman, Sung Amabile, PA-C  sacubitril-valsartan (ENTRESTO) 24-26 MG Take 1 tablet by mouth 2 (two) times daily. Patient not taking: Reported on 03/11/2022 01/21/22   de Saintclair Halsted, Cortney E, NP  senna-docusate (SENOKOT-S) 8.6-50 MG tablet Take 1 tablet by mouth at bedtime as needed for mild constipation or moderate constipation. Patient not taking: Reported on 03/20/2022 03/11/22   Mort Sawyers, FNP     Critical care time: 49 minutes     Joneen Roach, AGACNP-BC [facility]  See Amion for personal pager PCCM on call pager [phone] until 7pm. Please call Elink 7p-7a. [phone]  04/16/2023 3:06 PM      ,

## 2023-04-16 NOTE — ED Provider Notes (Signed)
EMERGENCY DEPARTMENT AT [facility] Provider Note   CSN: [id number] Arrival date & time: 04/16/23  6962     History  Chief Complaint  Patient presents with   Code Stroke    Casey Rangel is a 72 y.o. male.  Patient is a 72 year old male with a past medical history of CVA with prior dysarthria per EMS, vascular dementia presenting to the emergency department as a code stroke.  Per EMS, the patient's last known well was last night.  He was found this morning by family members nonverbal with right-sided weakness and a left gaze deficit.  EMS called prehospital arrival stroke alert.  Further history is limited due to patient's critical acuity.  The history is provided by the EMS personnel. The history is limited by the condition of the patient.       Home Medications Prior to Admission medications   Medication Sig Start Date End Date Taking? Authorizing Provider  aspirin EC 81 MG tablet Take 1 tablet (81 mg total) by mouth daily. Swallow whole. 01/22/22   de Saintclair Halsted, Cortney E, NP  carvedilol (COREG) 3.125 MG tablet Take 1 tablet (3.125 mg total) by mouth 2 (two) times daily with a meal. Patient not taking: Reported on 03/11/2022 01/21/22   de Saintclair Halsted, Cortney E, NP  donepezil (ARICEPT) 10 MG tablet Take half tablet (5 mg) daily for 2 weeks, then increase to the full tablet at 10 mg daily 03/20/22   Marcos Eke, PA-C  QUEtiapine (SEROQUEL) 25 MG tablet Take 1 tablet (25 mg total) by mouth at bedtime. 06/06/22   Wertman, Sung Amabile, PA-C  sacubitril-valsartan (ENTRESTO) 24-26 MG Take 1 tablet by mouth 2 (two) times daily. Patient not taking: Reported on 03/11/2022 01/21/22   de Saintclair Halsted, Cortney E, NP  senna-docusate (SENOKOT-S) 8.6-50 MG tablet Take 1 tablet by mouth at bedtime as needed for mild constipation or moderate constipation. Patient not taking: Reported on 03/20/2022 03/11/22   Mort Sawyers, FNP      Allergies    Penicillins    Review of Systems   Review  of Systems  Physical Exam Updated Vital Signs Wt 76.4 kg   BMI 24.17 kg/m  Physical Exam Vitals and nursing note reviewed.  Constitutional:      Appearance: Normal appearance.  HENT:     Head: Normocephalic.     Nose: Nose normal.     Mouth/Throat:     Mouth: Mucous membranes are moist.  Eyes:     Comments: Left gaze deficit  Cardiovascular:     Rate and Rhythm: Normal rate.  Pulmonary:     Effort: Pulmonary effort is normal.  Abdominal:     General: Abdomen is flat.  Musculoskeletal:        General: No deformity.     Cervical back: Normal range of motion.  Skin:    General: Skin is warm and dry.  Neurological:     Mental Status: He is alert.     Comments: Aphasia Right sided flaccid hemiparesis Was able to hold up left arm with some drift Not following commands without direction  Psychiatric:        Mood and Affect: Mood normal.        Behavior: Behavior normal.     ED Results / Procedures / Treatments   Labs (all labs ordered are listed, but only abnormal results are displayed) Labs Reviewed  CBC - Abnormal; Notable for the following components:  Result Value   WBC 12.9 (*)    All other components within normal limits  DIFFERENTIAL - Abnormal; Notable for the following components:   Neutro Abs 10.9 (*)    Abs Immature Granulocytes 0.25 (*)    All other components within normal limits  I-STAT CHEM 8, ED - Abnormal; Notable for the following components:   Potassium 5.4 (*)    BUN 32 (*)    Calcium, Ion 1.02 (*)    All other components within normal limits  CBG MONITORING, ED - Abnormal; Notable for the following components:   Glucose-Capillary 103 (*)    All other components within normal limits  ETHANOL  COMPREHENSIVE METABOLIC PANEL  PROTIME-INR  APTT    EKG None  Radiology No results found.  Procedures .Critical Care  Performed by: Rexford Maus, DO Authorized by: Rexford Maus, DO   Critical care provider statement:     Critical care time (minutes):  30   Critical care time was exclusive of:  Separately billable procedures and treating other patients   Critical care was necessary to treat or prevent imminent or life-threatening deterioration of the following conditions:  CNS failure or compromise   Critical care was time spent personally by me on the following activities:  Development of treatment plan with patient or surrogate, discussions with consultants, evaluation of patient's response to treatment, examination of patient, obtaining history from patient or surrogate, ordering and performing treatments and interventions, ordering and review of laboratory studies, ordering and review of radiographic studies, pulse oximetry and review of old charts   I assumed direction of critical care for this patient from another provider in my specialty: no     Care discussed with: admitting provider       Medications Ordered in ED Medications  sodium chloride flush (NS) 0.9 % injection 3 mL (has no administration in time range)  iohexol (OMNIPAQUE) 300 MG/ML solution 150 mL (has no administration in time range)  iohexol (OMNIPAQUE) 300 MG/ML solution 50 mL (has no administration in time range)  iohexol (OMNIPAQUE) 350 MG/ML injection 100 mL (100 mLs Intravenous Contrast Given 04/16/23 1016)    ED Course/ Medical Decision Making/ A&P                                 Medical Decision Making This patient presents to the ED with chief complaint(s) of code stroke with pertinent past medical history of prior stroke with residual dysarthria, vascular dementia which further complicates the presenting complaint. The complaint involves an extensive differential diagnosis and also carries with it a high risk of complications and morbidity.    The differential diagnosis includes CVA, TIA, ICH, mass effect, hypo or hyperglycemic crisis, recrudescence of prior stroke, infection  Additional history obtained: Additional history  obtained from EMS  Records reviewed Primary Care Documents  ED Course and Reassessment: Patient was made a prehospital arrival stroke alert.  Was immediately met at the [facility] by neurology and myself.  His airway was intact and he was notable to have right-sided deficits with the left gaze deficit concerning for acute stroke and he was immediately transferred to CT scanner.  The patient had head CT that showed no ICH or mass effect and underwent CTA did show concern for LVO.  Neurology activated code IR and he will be transported to IR for thrombectomy.  Independent labs interpretation:  The following labs were independently interpreted: Mild leukocytosis  otherwise within normal range  Independent visualization of imaging: - I independently visualized the following imaging with scope of interpretation limited to determining acute life threatening conditions related to emergency care: CT head Noncon, which revealed no ICH or mass effect  Consultation: - Consulted or discussed management/test interpretation w/ external professional: Neurology  Consideration for admission or further workup: Patient requires admission and further management for his acute stroke Social Determinants of health: N/A    Amount and/or Complexity of Data Reviewed Labs: ordered. Radiology: ordered.  Risk Decision regarding hospitalization.          Final Clinical Impression(s) / ED Diagnoses Final diagnoses:  Cerebral infarction, unspecified mechanism [facility])    Rx / DC Orders ED Discharge Orders     None         Rexford Maus, DO 04/16/23 1052

## 2023-04-16 NOTE — Procedures (Signed)
Extubation Procedure Note  Patient Details:   Name: Katon Maraldo DOB: [birth year] MRN: [id number]   Airway Documentation:    Vent end date: 04/16/23 Vent end time: 1610   Evaluation  O2 sats: stable throughout Complications: No apparent complications Patient did tolerate procedure well. Bilateral Breath Sounds: Diminished   No Pt was successfully extubated with no apparent complications. Pt is currently lying down due to procedure. Per ccm ok to extubate with no cuffleak or loss of Vte noted on ventilator. Pt currently on 4L with upper airway secretions. RT NT suctioned and obtained moderate tan/bloody secretions. No signs of stridor at this time. Pt currently VSS. RT will monitor as needed.   Jolayne Panther 04/16/2023, 4:16 PM

## 2023-04-16 NOTE — Anesthesia Procedure Notes (Signed)
Arterial Line Insertion Start/End8/29/2024 11:00 AM, 04/16/2023 11:06 AM Performed by: Val Eagle, MD, anesthesiologist  Preanesthetic checklist: patient identified, IV checked, site marked, risks and benefits discussed, surgical consent, monitors and equipment checked, pre-op evaluation, timeout performed and anesthesia consent Left, radial was placed Catheter size: 20 G Hand hygiene performed  and Seldinger technique used Allen's test indicative of satisfactory collateral circulation Attempts: 1 Procedure performed using ultrasound guided technique. Ultrasound Notes:anatomy identified Following insertion, dressing applied and Biopatch. Post procedure assessment: normal and unchanged  Patient tolerated the procedure well with no immediate complications.

## 2023-04-16 NOTE — Patient Outreach (Signed)
Care Coordination   04/16/2023 Name: Jemal Griffeth MRN: [id number] DOB: 1951/08/09   Care Coordination Outreach Attempts:  An unsuccessful telephone outreach was attempted today to offer the patient information about available care coordination services. Patient noted to be in the hospital  Follow Up Plan:  Additional outreach attempts will be made to offer the patient care coordination information and services.   Encounter Outcome:  No Answer   Care Coordination Interventions:  No, not indicated    Verna Czech, LCSW Clinical Social Worker  [phone]

## 2023-04-16 NOTE — Progress Notes (Addendum)
NGT to be palced before extubation to ensure he can get prescribed brilinta Extubating soon- taking 1L Vt on 5/5, following commands, tracking. Frequent gagging.  Head CT per protocol.  RN to verify that brilinta was given in IR before kangrelor is d/c.   Steffanie Dunn, DO 04/16/23 4:01 PM [facility]    Extubated, some snoring and secretions Sister [name] updated over the phone. She confirmed that he would not want him to be reintubated now that he is off the vent.   Steffanie Dunn, DO 04/16/23 4:12 PM [facility]  For contact information, see Amion. If no response to pager, please call PCCM consult pager. After hours, 7PM- 7AM, please call Elink.

## 2023-04-16 NOTE — Patient Outreach (Signed)
Care Coordination   Follow Up Visit Note   04/16/2023 Name: Casey Rangel MRN: [id number] DOB: 1950-09-20  Casey Rangel is a 72 y.o. year old male who sees Casey Sawyers, FNP for primary care. I spoke with  Casey Rangel's sister by phone today.  What matters to the patients health and wellness today?  Patient's sister confirms that patient is now hospitalized following a stroke. Patient's sister would like to consider out of home placement for patient post discharge due to his anticipated care needs.    Goals Addressed             This Visit's Progress    care coordinaiton activities       Interventions Today    Flowsheet Row Most Recent Value  Chronic Disease   Chronic disease during today's visit Other  [stroke]  General Interventions   General Interventions Discussed/Reviewed General Interventions Discussed, Doctor Visits, Level of Care  Doctor Visits Discussed/Reviewed Doctor Visits Discussed  [patient admitted to hospital following stroke, surgery scheduled for today]  Level of Care Skilled Nursing Facility  [Pt's sister discussed concerns related to ability to provide for pt's care needs s/p hospitalization, considering need for placement in a SNF-encouraged follow up with the inpatient case management team to discuss options -supportive counseling provided]  Safety Interventions   Safety Discussed/Reviewed Safety Discussed              SDOH assessments and interventions completed:  No     Care Coordination Interventions:  Yes, provided   Follow up plan: Follow up call scheduled for 04/23/23    Encounter Outcome:  Pt. Visit Completed

## 2023-04-16 NOTE — Anesthesia Procedure Notes (Signed)
Procedure Name: Intubation Date/Time: 04/16/2023 10:45 AM  Performed by: Camillia Herter, CRNAPre-anesthesia Checklist: Patient identified, Emergency Drugs available, Suction available, Patient being monitored and Timeout performed Patient Re-evaluated:Patient Re-evaluated prior to induction Oxygen Delivery Method: Circle system utilized Preoxygenation: Pre-oxygenation with 100% oxygen Induction Type: IV induction and Rapid sequence Laryngoscope Size: Miller and 2 Grade View: Grade I Tube type: Subglottic suction tube Tube size: 7.5 mm Number of attempts: 1 Airway Equipment and Method: Stylet and Video-laryngoscopy Placement Confirmation: ETT inserted through vocal cords under direct vision, breath sounds checked- equal and bilateral and positive ETCO2 Secured at: 23 cm Tube secured with: Tape Dental Injury: Teeth and Oropharynx as per pre-operative assessment

## 2023-04-16 NOTE — ED Triage Notes (Signed)
Patient arrives from home via EMS as Code Stroke. Patient LKW last night at 2100 and found by family at 0930 this morning with R side flaccid and aphasic.

## 2023-04-17 ENCOUNTER — Inpatient Hospital Stay (HOSPITAL_COMMUNITY): Payer: Medicare Other

## 2023-04-17 ENCOUNTER — Other Ambulatory Visit (HOSPITAL_COMMUNITY): Payer: Self-pay

## 2023-04-17 ENCOUNTER — Telehealth: Payer: Self-pay | Admitting: *Deleted

## 2023-04-17 ENCOUNTER — Encounter (HOSPITAL_COMMUNITY): Payer: Self-pay | Admitting: Radiology

## 2023-04-17 ENCOUNTER — Telehealth: Payer: Self-pay | Admitting: Family

## 2023-04-17 DIAGNOSIS — J9601 Acute respiratory failure with hypoxia: Secondary | ICD-10-CM

## 2023-04-17 DIAGNOSIS — I6389 Other cerebral infarction: Secondary | ICD-10-CM

## 2023-04-17 LAB — COMPREHENSIVE METABOLIC PANEL
ALT: 14 U/L (ref 0–44)
AST: 23 U/L (ref 15–41)
Albumin: 3.2 g/dL — ABNORMAL LOW (ref 3.5–5.0)
Alkaline Phosphatase: 52 U/L (ref 38–126)
Anion gap: 11 (ref 5–15)
BUN: 11 mg/dL (ref 8–23)
CO2: 21 mmol/L — ABNORMAL LOW (ref 22–32)
Calcium: 8.4 mg/dL — ABNORMAL LOW (ref 8.9–10.3)
Chloride: 107 mmol/L (ref 98–111)
Creatinine, Ser: 1.22 mg/dL (ref 0.61–1.24)
GFR, Estimated: >60 mL/min (ref >60)
Glucose, Bld: 104 mg/dL — ABNORMAL HIGH (ref 70–99)
Potassium: 3.7 mmol/L (ref 3.5–5.1)
Sodium: 139 mmol/L (ref 135–145)
Total Bilirubin: 1.1 mg/dL (ref 0.3–1.2)
Total Protein: 6.2 g/dL — ABNORMAL LOW (ref 6.5–8.1)

## 2023-04-17 LAB — ECHOCARDIOGRAM COMPLETE
AR max vel: 2.77 cm2
AV Peak grad: 5.4 mmHg
Ao pk vel: 1.16 m/s
Area-P 1/2: 3.62 cm2
Calc EF: 47.8 %
Height: 70 in
P 1/2 time: 242 ms
S' Lateral: 4.1 cm
Single Plane A2C EF: 48.8 %
Single Plane A4C EF: 46.2 %
Weight: 2694.9 [oz_av]

## 2023-04-17 LAB — CBC WITH DIFFERENTIAL/PLATELET
Abs Immature Granulocytes: 0.06 10*3/uL (ref 0.00–0.07)
Basophils Absolute: 0 10*3/uL (ref 0.0–0.1)
Basophils Relative: 0 %
Eosinophils Absolute: 0 10*3/uL (ref 0.0–0.5)
Eosinophils Relative: 0 %
HCT: 40.3 % (ref 39.0–52.0)
Hemoglobin: 14 g/dL (ref 13.0–17.0)
Immature Granulocytes: 1 %
Lymphocytes Relative: 9 %
Lymphs Abs: 1 10*3/uL (ref 0.7–4.0)
MCH: 30.4 pg (ref 26.0–34.0)
MCHC: 34.7 g/dL (ref 30.0–36.0)
MCV: 87.6 fL (ref 80.0–100.0)
Monocytes Absolute: 0.8 10*3/uL (ref 0.1–1.0)
Monocytes Relative: 7 %
Neutro Abs: 9.9 10*3/uL — ABNORMAL HIGH (ref 1.7–7.7)
Neutrophils Relative %: 83 %
Platelets: 266 10*3/uL (ref 150–400)
RBC: 4.6 MIL/uL (ref 4.22–5.81)
RDW: 13.3 % (ref 11.5–15.5)
WBC: 11.8 10*3/uL — ABNORMAL HIGH (ref 4.0–10.5)
nRBC: 0 % (ref 0.0–0.2)

## 2023-04-17 LAB — LIPID PANEL
Cholesterol: 186 mg/dL (ref 0–200)
HDL: 43 mg/dL (ref >40)
LDL Cholesterol: 132 mg/dL — ABNORMAL HIGH (ref 0–99)
Total CHOL/HDL Ratio: 4.3 ratio
Triglycerides: 57 mg/dL (ref <150)
VLDL: 11 mg/dL (ref 0–40)

## 2023-04-17 LAB — PHOSPHORUS: Phosphorus: 2.7 mg/dL (ref 2.5–4.6)

## 2023-04-17 LAB — MAGNESIUM: Magnesium: 2.2 mg/dL (ref 1.7–2.4)

## 2023-04-17 MED ORDER — ORAL CARE MOUTH RINSE
15.0000 mL | OROMUCOSAL | Status: DC
  Administered 2023-04-17 – 2023-05-02 (×59): 15 mL via OROMUCOSAL

## 2023-04-17 MED ORDER — ORAL CARE MOUTH RINSE: 15.0000 mL | OROMUCOSAL | Status: DC | PRN

## 2023-04-17 MED ORDER — POTASSIUM CHLORIDE 20 MEQ PO PACK
40.0000 meq | PACK | Freq: Once | ORAL | Status: AC
  Administered 2023-04-17: 40 meq
  Filled 2023-04-17: qty 2

## 2023-04-17 MED ORDER — DONEPEZIL HCL 10 MG PO TABS
5.0000 mg | ORAL_TABLET | Freq: Every day | ORAL | Status: DC
  Administered 2023-04-17 – 2023-04-18 (×2): 5 mg
  Filled 2023-04-17 (×2): qty 1

## 2023-04-17 MED ORDER — CARVEDILOL 3.125 MG PO TABS
3.1250 mg | ORAL_TABLET | Freq: Two times a day (BID) | ORAL | Status: DC
  Administered 2023-04-17 – 2023-04-21 (×8): 3.125 mg
  Filled 2023-04-17 (×8): qty 1

## 2023-04-17 MED ORDER — ROSUVASTATIN CALCIUM 20 MG PO TABS
20.0000 mg | ORAL_TABLET | Freq: Every day | ORAL | Status: DC
  Administered 2023-04-17 – 2023-04-21 (×4): 20 mg
  Filled 2023-04-17 (×4): qty 1

## 2023-04-17 NOTE — Progress Notes (Signed)
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 Per RN patient not following commands, non verbal, moves all 4 extremities spontaneously but little movement RUE, does not cross midline.  Allergies: Penicillins  Medications: Prior to Admission medications   Medication Sig Start Date End Date Taking? Authorizing Provider  aspirin EC 81 MG tablet Take 1 tablet (81 mg total) by mouth daily. Swallow whole. 01/22/22   de Saintclair Halsted, Cortney E, NP  carvedilol (COREG) 3.125 MG tablet Take 1 tablet (3.125 mg total) by mouth 2 (two) times daily with a meal. Patient not taking: Reported on 03/11/2022 01/21/22   de Saintclair Halsted, Cortney E, NP  donepezil (ARICEPT) 10 MG tablet Take half tablet (5 mg) daily for 2 weeks, then increase to the full tablet at 10 mg daily 03/20/22   Marcos Eke, PA-C  QUEtiapine (SEROQUEL) 25 MG tablet Take 1 tablet (25 mg total) by mouth at bedtime. 06/06/22   Wertman, Sung Amabile, PA-C  sacubitril-valsartan (ENTRESTO) 24-26 MG Take 1 tablet by mouth 2 (two) times daily. Patient not taking: Reported on 03/11/2022 01/21/22   de Saintclair Halsted, Cortney E, NP  senna-docusate (SENOKOT-S) 8.6-50 MG tablet Take 1 tablet by mouth at bedtime as needed for mild constipation or moderate constipation. Patient not taking: Reported on 03/20/2022 03/11/22   Mort Sawyers, FNP     Vital Signs: BP 117/66   Pulse 88   Temp 99.3 F (37.4 C) (Axillary)   Resp 17   Ht 5\' 10"  (1.778 m)   Wt 168 lb 6.9 oz (76.4 kg)   SpO2 100%   BMI 24.17 kg/m   Physical Exam Vitals reviewed.  Constitutional:      General: He is not in acute distress.    Appearance: He is ill-appearing.  Cardiovascular:     Rate and Rhythm: Normal rate.     Comments: (+) Right CFA puncture site  clean, dry, dressed appropriately. Soft, non pulsatile, no significant hematoma. Pulmonary:     Effort: Pulmonary effort is normal.  Abdominal:     Comments: (+) NG   Skin:    General: Skin is warm and dry.  Neurological:     Comments: Briskly retracts BLE to stimuli     Imaging: MR BRAIN WO CONTRAST  Result Date: 04/16/2023 CLINICAL DATA:  Acute neurologic deficit EXAM: MRI HEAD WITHOUT CONTRAST TECHNIQUE: Multiplanar, multiecho pulse sequences of the brain and surrounding structures were obtained without intravenous contrast. COMPARISON:  01/16/2022 FINDINGS: Brain: Multifocal acute ischemia within the left MCA territory. 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 No acute or chronic hemorrhage. There is confluent hyperintense T2-weighted signal within the white matter. Generalized volume loss. Multiple old small vessel infarcts of the corona radiata. The midline structures are normal. Vascular: Major flow voids are preserved. Skull and upper cervical spine: Normal calvarium and skull base. Visualized upper cervical spine and soft tissues are normal. Sinuses/Orbits:No paranasal sinus fluid levels or advanced mucosal thickening. No mastoid or middle ear effusion. Normal orbits. IMPRESSION: 1. Multifocal acute ischemia within the left MCA territory. No hemorrhage or mass effect. 2. 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 Electronically Signed   By: Deatra Robinson M.D.   On: 04/16/2023 23:25   DG Abd Portable 1V  Result Date: 04/16/2023 CLINICAL  DATA:  NG placement. EXAM: PORTABLE ABDOMEN - 1 VIEW COMPARISON:  None Available. FINDINGS: Partially visualized enteric tube with side port at the GE junction and tip in the proximal stomach. Recommend further advancing by additional 6 cm for optimal positioning. IMPRESSION: Enteric tube with tip in the proximal stomach. Recommend further advancing by additional 6 cm for optimal positioning. Electronically Signed   By: Elgie Collard M.D.    On: 04/16/2023 21:07   CT HEAD WO CONTRAST ( )  Result Date: 04/16/2023 CLINICAL DATA:  Stroke follow-up EXAM: CT HEAD WITHOUT CONTRAST TECHNIQUE: Contiguous axial images were obtained from the base of the skull through the vertex without intravenous contrast. RADIATION DOSE REDUCTION: This exam was performed according to the departmental dose-optimization program which includes automated exposure control, adjustment of the mA and/or kV according to patient size and/or use of iterative reconstruction technique. COMPARISON:  04/16/2023 FINDINGS: Brain: Status post placement of stent in the left MCA. There is hyperdense material in the left sylvian fissure. Unchanged appearance of infarcts of both superior parietal lobes, right occipital lobe and posterior left parietal lobe. Vascular: Atherosclerotic calcification of the internal carotid arteries at the skull base. No abnormal hyperdensity of the major intracranial arteries or dural venous sinuses. Skull: The visualized skull base, calvarium and extracranial soft tissues are normal. Sinuses/Orbits: No fluid levels or advanced mucosal thickening of the visualized paranasal sinuses. No mastoid or middle ear effusion. The orbits are normal. IMPRESSION: 1. Status post placement of stent in the left MCA. 2. Hyperdense material in the left Sylvian fissure, likely extravasated contrast. 3. Unchanged appearance of infarcts of both superior parietal lobes, right occipital lobe and posterior left parietal lobe. Electronically Signed   By: Deatra Robinson M.D.   On: 04/16/2023 18:59   Portable Chest x-ray  Result Date: 04/16/2023 CLINICAL DATA:  Evaluate endotracheal tube placement. EXAM: PORTABLE CHEST 1 VIEW COMPARISON:  03/10/2022 FINDINGS: The ET tube tip 6.2 cm is above the carina. There is an enteric tube with tip coursing below the level of the hemidiaphragms. Stable cardiac enlargement. Lung volumes are low. Mild diffuse increase interstitial markings.  Asymmetric peribronchovascular opacity within the left lower lung is identified. The visualized osseous structures appear grossly intact. IMPRESSION: 1. ET tube tip is above the carina. 2. Asymmetric peribronchovascular opacity within the left lower lung may reflect atelectasis or pneumonia. 3. Mild diffuse increase interstitial markings concerning for edema. Electronically Signed   By: Signa Kell M.D.   On: 04/16/2023 14:58   CT HEAD CODE STROKE WO CONTRAST  Result Date: 04/16/2023 CLINICAL DATA:  Code stroke. Neuro deficit, acute, stroke suspected. EXAM: CT ANGIOGRAPHY HEAD AND NECK CT PERFUSION HEAD TECHNIQUE: Multidetector CT imaging of the head and neck was performed using the standard protocol during bolus administration of intravenous contrast. Multiplanar CT image reconstructions and MIPs were obtained to evaluate the vascular anatomy. Carotid stenosis measurements (when applicable) are obtained utilizing NASCET criteria, using the distal internal carotid diameter as the denominator. Multiphase CT imaging of the brain was performed following IV bolus contrast injection. Subsequent parametric perfusion maps were calculated using RAPID software. RADIATION DOSE REDUCTION: This exam was performed according to the departmental dose-optimization program which includes automated exposure control, adjustment of the mA and/or kV according to patient size and/or use of iterative reconstruction technique. CONTRAST:  OMNIPAQUE IOHEXOL 350 MG/ML SOLN COMPARISON:  Head CT 02/11/2023. MRA head 01/16/2022. CT angiogram head/neck 01/15/2022. FINDINGS: CT HEAD FINDINGS Brain: Generalized cerebral atrophy. Loss of gray-white differentiation at  the left insula consistent with an acute left MCA territory infarct (series 3, image 20). Redemonstrated chronic cortical/subcortical infarcts elsewhere within the bilateral frontal, bilateral parietal, right occipital and right temporal lobes. Chronic lacunar infarcts  within the right corona radiata and bilateral deep gray nuclei, some of which were better appreciated on the prior brain MRI of 01/16/2022. 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 Redemonstrated small chronic infarct within the right cerebellar hemisphere. There is no acute intracranial hemorrhage. No evidence of an intracranial mass. No midline shift. Vascular: No hyperdense vessel.  Atherosclerotic calcifications. Skull: No calvarial fracture or aggressive osseous lesion. Sinuses/Orbits: No orbital mass or acute orbital finding. Mild mucosal thickening within the right maxillary sinus. Mild-to-moderate mucosal thickening within the left maxillary sinus. Mucosal thickening and fluid within bilateral ethmoid air cells, overall moderate in severity. Mild mucosal thickening within the bilateral frontal sinuses. ASPECTS [facility] Stroke Program Early CT Score) - Ganglionic level infarction (caudate, lentiform nuclei, internal capsule, insula, M1-M3 cortex): 6 - Supraganglionic infarction (M4-M6 cortex): 3 Total score (0-10 with 10 being normal): 9 Acute left MCA territory infarct as described. These results were called by telephone at the time of interpretation on 04/16/2023 at 10:10 am to provider Dr. Derry Lory, Who verbally acknowledged these results. Review of the MIP images confirms the above findings CTA NECK FINDINGS Aortic arch: Standard aortic branching. Atherosclerotic plaque within the visualized aortic arch and proximal major branch vessels of the neck No hemodynamically significant innominate or proximal subclavian artery stenosis. Right carotid system: CCA and ICA patent within the neck. Atherosclerotic plaque about the carotid bifurcation and within the proximal ICA. Less than 50% stenosis of the proximal ICA. Left carotid system: CCA and ICA patent within the neck. Atherosclerotic plaque about the carotid  bifurcation and within the proximal ICA. Less than 50% stenosis of the proximal ICA. Vertebral arteries: Vertebral arteries patent within the neck without stenosis. Mild nonstenotic calcified plaque within the left vertebral artery at the V3/V4 junction. Skeleton: Cervical spondylosis. Facet ankylosis on the right, and mild grade 1 anterolisthesis, at C2-C3. Partially imaged levocurvature of the thoracic spine. Other neck: No neck mass or cervical lymphadenopathy. Upper chest: 7 mm pulmonary nodule within the superior segment of the right lower lobe (series 5, image 372). Review of the MIP images confirms the above findings CTA HEAD FINDINGS Anterior circulation: The intracranial internal carotid arteries are patent. Nonstenotic atherosclerotic plaque within both vessels. Redemonstrated moderate stenosis within the left middle cerebral artery mid M1 segment. Occlusion of a proximal M2 left MCA vessel at its origin, new from the prior MRA head of 01/16/2022 (for instance as seen on series 10, image 18). The right middle cerebral artery M1 segment is patent. No right M2 proximal branch occlusion or high-grade proximal stenosis. The anterior cerebral arteries are patent. No intracranial aneurysm is identified. Posterior circulation: The non dominant right vertebral artery terminates predominantly as the right PICA. Nonstenotic atherosclerotic plaque within the left vertebral artery at the V3/V4 junction. Mild atherosclerotic narrowing of the left vertebral artery/proximal basilar artery at the vertebrobasilar junction. The basilar artery is patent. The posterior cerebral arteries are patent. Hypoplastic P1 segments with sizable posterior communicating arteries, bilaterally. Venous sinuses: Within the limitations of contrast timing, no convincing thrombus. Anatomic variants: As described. Review of the MIP images confirms the above findings CT Brain Perfusion Findings: CBF (<30%) Volume: 46 mL Perfusion (Tmax>6.0s)  volume: 91 mL Mismatch Volume: 45mL  Infarction Location:Left MCA vascular territory. CTA head impression #1 called by telephone at the time of interpretation on 04/16/2023 at 11:07 am to provider Dr. Derry Lory, Who verbally acknowledged these results. IMPRESSION: CT head: 1. Acute left MCA territory infarct involving the left insula. ASPECTS is 9. 2. 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 3. Background parenchymal atrophy, chronic small vessel ischemic disease and chronic infarcts as described. 4. Paranasal sinus disease as described. CTA neck: 1. The common carotid and internal carotid arteries are patent within the neck. Atherosclerotic plaque bilaterally, as described (resulting in less than 50% stenosis). Redemonstrated ulcerated plaque within the right carotid bulb. 2. Vertebral arteries patent within the neck without stenosis. Mild non-stenotic atherosclerotic plaque within the left vertebral artery at the V3/V4 junction. 3. Aortic Atherosclerosis (ICD10-I70.0). 4. 7 mm pulmonary nodule within the superior segment of the right lower lobe. Non-contrast chest CT at 6-12 months is recommended. If the nodule is stable at time of repeat CT, then future CT at 18-24 months (from today's scan) is considered optional for low-risk patients, but is recommended for high-risk patients. This recommendation follows the consensus statement: Guidelines for Management of Incidental Pulmonary Nodules Detected on CT Images: From the Fleischner Society 2017; Radiology 2017; 284:228-243. CTA head: 1. Acute occlusion of a proximal M2 left middle cerebral artery vessel (at its origin). 2. Redemonstrated moderate stenosis within the left middle cerebral artery mid M1 segment. 3. Mild atherosclerotic narrowing of the left vertebral artery and proximal basilar artery (at the vertebrobasilar junction). CT perfusion head: The perfusion  software identifies a region of brain parenchyma in the left MCA territory with a CBF < 30% (estimated core infarct). The perfusion software identifies a 91 mL region of critically hypoperfused parenchyma within the left MCA territory (utilizing the Tmax>6 seconds threshold). Reported mismatch volume: 45 mL. Electronically Signed   By: Jackey Loge D.O.   On: 04/16/2023 11:08   CT ANGIO HEAD NECK W WO CM W PERF (CODE STROKE)  Result Date: 04/16/2023 CLINICAL DATA:  Code stroke. Neuro deficit, acute, stroke suspected. EXAM: CT ANGIOGRAPHY HEAD AND NECK CT PERFUSION HEAD TECHNIQUE: Multidetector CT imaging of the head and neck was performed using the standard protocol during bolus administration of intravenous contrast. Multiplanar CT image reconstructions and MIPs were obtained to evaluate the vascular anatomy. Carotid stenosis measurements (when applicable) are obtained utilizing NASCET criteria, using the distal internal carotid diameter as the denominator. Multiphase CT imaging of the brain was performed following IV bolus contrast injection. Subsequent parametric perfusion maps were calculated using RAPID software. RADIATION DOSE REDUCTION: This exam was performed according to the departmental dose-optimization program which includes automated exposure control, adjustment of the mA and/or kV according to patient size and/or use of iterative reconstruction technique. CONTRAST:  OMNIPAQUE IOHEXOL 350 MG/ML SOLN COMPARISON:  Head CT 02/11/2023. MRA head 01/16/2022. CT angiogram head/neck 01/15/2022. FINDINGS: CT HEAD FINDINGS Brain: Generalized cerebral atrophy. Loss of gray-white differentiation at the left insula consistent with an acute left MCA territory infarct (series 3, image 20). Redemonstrated chronic cortical/subcortical infarcts elsewhere within the bilateral frontal, bilateral parietal, right occipital and right temporal lobes. Chronic lacunar infarcts within the right corona radiata and  bilateral deep gray nuclei, some of which were better appreciated on the prior brain MRI of 01/16/2022. 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 Redemonstrated small chronic infarct  within the right cerebellar hemisphere. There is no acute intracranial hemorrhage. No evidence of an intracranial mass. No midline shift. Vascular: No hyperdense vessel.  Atherosclerotic calcifications. Skull: No calvarial fracture or aggressive osseous lesion. Sinuses/Orbits: No orbital mass or acute orbital finding. Mild mucosal thickening within the right maxillary sinus. Mild-to-moderate mucosal thickening within the left maxillary sinus. Mucosal thickening and fluid within bilateral ethmoid air cells, overall moderate in severity. Mild mucosal thickening within the bilateral frontal sinuses. ASPECTS [facility] Stroke Program Early CT Score) - Ganglionic level infarction (caudate, lentiform nuclei, internal capsule, insula, M1-M3 cortex): 6 - Supraganglionic infarction (M4-M6 cortex): 3 Total score (0-10 with 10 being normal): 9 Acute left MCA territory infarct as described. These results were called by telephone at the time of interpretation on 04/16/2023 at 10:10 am to provider Dr. Derry Lory, Who verbally acknowledged these results. Review of the MIP images confirms the above findings CTA NECK FINDINGS Aortic arch: Standard aortic branching. Atherosclerotic plaque within the visualized aortic arch and proximal major branch vessels of the neck No hemodynamically significant innominate or proximal subclavian artery stenosis. Right carotid system: CCA and ICA patent within the neck. Atherosclerotic plaque about the carotid bifurcation and within the proximal ICA. Less than 50% stenosis of the proximal ICA. Left carotid system: CCA and ICA patent within the neck. Atherosclerotic plaque about the carotid bifurcation and within the proximal ICA.  Less than 50% stenosis of the proximal ICA. Vertebral arteries: Vertebral arteries patent within the neck without stenosis. Mild nonstenotic calcified plaque within the left vertebral artery at the V3/V4 junction. Skeleton: Cervical spondylosis. Facet ankylosis on the right, and mild grade 1 anterolisthesis, at C2-C3. Partially imaged levocurvature of the thoracic spine. Other neck: No neck mass or cervical lymphadenopathy. Upper chest: 7 mm pulmonary nodule within the superior segment of the right lower lobe (series 5, image 372). Review of the MIP images confirms the above findings CTA HEAD FINDINGS Anterior circulation: The intracranial internal carotid arteries are patent. Nonstenotic atherosclerotic plaque within both vessels. Redemonstrated moderate stenosis within the left middle cerebral artery mid M1 segment. Occlusion of a proximal M2 left MCA vessel at its origin, new from the prior MRA head of 01/16/2022 (for instance as seen on series 10, image 18). The right middle cerebral artery M1 segment is patent. No right M2 proximal branch occlusion or high-grade proximal stenosis. The anterior cerebral arteries are patent. No intracranial aneurysm is identified. Posterior circulation: The non dominant right vertebral artery terminates predominantly as the right PICA. Nonstenotic atherosclerotic plaque within the left vertebral artery at the V3/V4 junction. Mild atherosclerotic narrowing of the left vertebral artery/proximal basilar artery at the vertebrobasilar junction. The basilar artery is patent. The posterior cerebral arteries are patent. Hypoplastic P1 segments with sizable posterior communicating arteries, bilaterally. Venous sinuses: Within the limitations of contrast timing, no convincing thrombus. Anatomic variants: As described. Review of the MIP images confirms the above findings CT Brain Perfusion Findings: CBF (<30%) Volume: 46 mL Perfusion (Tmax>6.0s) volume: 91 mL Mismatch Volume: 45mL  Infarction Location:Left MCA vascular territory. CTA head impression #1 called by telephone at the time of interpretation on 04/16/2023 at 11:07 am to provider Dr. Derry Lory, Who verbally acknowledged these results. IMPRESSION: CT head: 1. Acute left MCA territory infarct involving the left insula. ASPECTS is 9. 2. 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 3. Background parenchymal atrophy, chronic small vessel ischemic disease and chronic infarcts as described. 4. Paranasal  sinus disease as described. CTA neck: 1. The common carotid and internal carotid arteries are patent within the neck. Atherosclerotic plaque bilaterally, as described (resulting in less than 50% stenosis). Redemonstrated ulcerated plaque within the right carotid bulb. 2. Vertebral arteries patent within the neck without stenosis. Mild non-stenotic atherosclerotic plaque within the left vertebral artery at the V3/V4 junction. 3. Aortic Atherosclerosis (ICD10-I70.0). 4. 7 mm pulmonary nodule within the superior segment of the right lower lobe. Non-contrast chest CT at 6-12 months is recommended. If the nodule is stable at time of repeat CT, then future CT at 18-24 months (from today's scan) is considered optional for low-risk patients, but is recommended for high-risk patients. This recommendation follows the consensus statement: Guidelines for Management of Incidental Pulmonary Nodules Detected on CT Images: From the Fleischner Society 2017; Radiology 2017; 284:228-243. CTA head: 1. Acute occlusion of a proximal M2 left middle cerebral artery vessel (at its origin). 2. Redemonstrated moderate stenosis within the left middle cerebral artery mid M1 segment. 3. Mild atherosclerotic narrowing of the left vertebral artery and proximal basilar artery (at the vertebrobasilar junction). CT perfusion head: The perfusion software identifies a region of brain  parenchyma in the left MCA territory with a CBF < 30% (estimated core infarct). The perfusion software identifies a 91 mL region of critically hypoperfused parenchyma within the left MCA territory (utilizing the Tmax>6 seconds threshold). Reported mismatch volume: 45 mL. Electronically Signed   By: Jackey Loge D.O.   On: 04/16/2023 11:08    Labs:  CBC: Recent Labs    02/11/23 1015 04/16/23 0958 04/16/23 1006 04/16/23 1100 04/16/23 1455 04/17/23 0552  WBC 8.2 12.9*  --  12.3*  --  11.8*  HGB 14.8 15.5 15.0 15.2 13.6 14.0  HCT 45.0 45.4 44.0 44.5 40.0 40.3  PLT 273 287  --  259  --  266    COAGS: Recent Labs    02/11/23 1015 04/16/23 1100  INR 1.1 1.1  APTT 27 28    BMP: Recent Labs    02/11/23 1015 04/16/23 1006 04/16/23 1100 04/16/23 1455 04/17/23 0552  NA 140 138 135 140 139  K 4.3 5.4* 3.3* 4.0 3.7  CL 108 107 103  --  107  CO2 23  --  20*  --  21*  GLUCOSE 93 98 93  --  104*  BUN 28* 32* 21  --  11  CALCIUM 9.1  --  8.4*  --  8.4*  CREATININE 0.97 1.00 1.11  --  1.22  GFRNONAA >60  --  >60  --  >60    LIVER FUNCTION TESTS: Recent Labs    02/11/23 1015 04/16/23 1100 04/17/23 0552  BILITOT 1.3* 1.3* 1.1  AST 14* 20 23  ALT 8 12 14   ALKPHOS 55 55 52  PROT 6.8 6.3* 6.2*  ALBUMIN 4.1 3.5 3.2*    Assessment and Plan:  72 y/o M admitted as a code stroke 04/16/23 found to have 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  Patient non verbal, does not track examiner, per RN moves all 4 extremities spontaneously but much less so on the right upper extremity. Right CFA puncture site unremarkable.  No further NIR needs at this time, continue ASA 81 mg every day + Brilinta 90 mg BID. Plans per neurology/primary team.  NIR remains available as needed, please call with questions or concerns.  Electronically  Signed:  Villa Herb, PA-C 04/17/2023, 8:59 AM   I spent a total of 15 Minutes at the the patient's bedside AND on the patient's hospital floor or unit, greater than 50% of which was counseling/coordinating care for occluded distal M1.

## 2023-04-17 NOTE — Procedures (Signed)
Cortrak  Person Inserting Tube:  Pitts, Heather C, RD Tube Type:  Cortrak - 43 inches Tube Size:  10 Tube Location:  Left nare Secured by: Bridle Technique Used to Measure Tube Placement:  Marking at nare/corner of mouth Cortrak Secured At:  68 cm   Cortrak Tube Team Note:  Consult received to place a Cortrak feeding tube.   X-ray is required, abdominal x-ray has been ordered by the Cortrak team. Please confirm tube placement before using the Cortrak tube.   If the tube becomes dislodged please keep the tube and contact the Cortrak team at [email or web address] for replacement.  If after hours and replacement cannot be delayed, place a NG tube and confirm placement with an abdominal x-ray.    Heather P., RD, LDN, CNSC See AMiON for contact information    

## 2023-04-17 NOTE — Progress Notes (Signed)
SLP Cancellation Note  Patient Details Name: Casey Rangel MRN: [id number] DOB: Jul 31, 1951   Cancelled treatment:       Reason Eval/Treat Not Completed: Fatigue/lethargy limiting ability to participate. Pt extubated on previous date but not appropriate for SLP evaluation per discussion with RN. Will f/u as able.     Mahala Menghini., M.A. CCC-SLP Acute Rehabilitation Services Office [phone]  Secure chat preferred  04/17/2023, 9:33 AM

## 2023-04-17 NOTE — Progress Notes (Signed)
[facility] ADULT ICU REPLACEMENT PROTOCOL   The patient does apply for the [facility] Adult ICU Electrolyte Replacment Protocol based on the criteria listed below:   1.Exclusion criteria: TCTS, ECMO, Dialysis, and Myasthenia Gravis patients 2. Is GFR >/= 30 ml/min? Yes.    Patient's GFR today is >60 3. Is SCr </= 2? Yes.   Patient's SCr is 1.22 mg/dL 4. Did SCr increase >/= 0.5 in 24 hours? No. 5.Pt's weight >40kg  Yes.   6. Abnormal electrolyte(s): K+ = 3.7  7. Electrolytes replaced per protocol 8.  Call MD STAT for K+ </= 2.5, Phos </= 1, or Mag </= 1 Physician:  Delia Chimes, eMD  Faye Ramsay 04/17/2023 6:33 AM

## 2023-04-17 NOTE — Progress Notes (Signed)
Inpatient Rehab Admissions Coordinator:  ? ?Per therapy recommendations,  patient was screened for CIR candidacy by Laura Staley, MS, CCC-SLP. At this time, Pt. Appears to be a a potential candidate for CIR. I will place   order for rehab consult per protocol for full assessment. Please contact me any with questions. ? ?Laura Staley, MS, CCC-SLP ?Rehab Admissions Coordinator  ?[phone] (celll) ?[phone] (office) ? ?

## 2023-04-17 NOTE — Telephone Encounter (Signed)
Patient sister [name] called in and wanted to speak with you in regards to her brother Casey Rangel. She stated that she tried to call your line directly but she is getting a dial tone. Thank you!

## 2023-04-17 NOTE — TOC Initial Note (Signed)
Transition of Care [facility]) - Initial/Assessment Note    Patient Details  Name: Casey Rangel MRN: [id number] Date of Birth: 11/22/1950  [facility]) CM/SW Contact:    Mearl Latin, LCSW Phone Number: 04/17/2023, 3:08 PM  Clinical Narrative:                 Patient admitted from home with history of Dementia with his sister as caregiver. CSW following for SNF needs if CIR is unable to accept patient.   Expected Discharge Plan: Skilled Nursing Facility Barriers to Discharge: Continued Medical Work up   Patient Goals and CMS Choice            Expected Discharge Plan and Services In-house Referral: Clinical Social Work     Living arrangements for the past 2 months: Mobile Home                                      Prior Living Arrangements/Services Living arrangements for the past 2 months: Mobile Home   Patient language and need for interpreter reviewed:: Yes        Need for Family Participation in Patient Care: Yes (Comment) Care giver support system in place?: Yes (comment)   Criminal Activity/Legal Involvement Pertinent to Current Situation/Hospitalization: No - Comment as needed  Activities of Daily Living      Permission Sought/Granted                  Emotional Assessment Appearance:: Appears stated age Attitude/Demeanor/Rapport: Unable to Assess Affect (typically observed): Unable to Assess Orientation: :  (Disoriented x4) Alcohol / Substance Use: Not Applicable Psych Involvement: No (comment)  Admission diagnosis:  Acute ischemic left MCA stroke (HCC) [I63.512] Cerebral infarction, unspecified mechanism (HCC) [I63.9] Middle cerebral artery embolism, left [I66.02] Patient Active Problem List   Diagnosis Date Noted   Acute respiratory failure with hypoxia (HCC) 04/16/2023   Middle cerebral artery embolism, 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   Vascular dementia (HCC) 03/20/2022   Acute cognitive decline 03/11/2022   Mixed hyperlipidemia  03/11/2022   Constipation 03/11/2022   Acute combined systolic and diastolic heart failure (HCC)    Acute ischemic left MCA stroke (HCC) 01/15/2022   Primary hypertension 11/14/2020   PCP:  Mort Sawyers, FNP Pharmacy:   [facility] [phone] - [address] [place] - [address] [place] [address] Phone: [phone] Fax: [phone]  [facility] [address] [place] [address] Phone: [phone] Fax: [phone]     Social Determinants of Health (SDOH) Social History: SDOH Screenings   Food Insecurity: No Food Insecurity (04/02/2023)  Housing: Medium Risk (03/21/2022)  Transportation Needs: No Transportation Needs (04/02/2023)  Utilities: Not At Risk (04/02/2023)  Physical Activity: Inactive (03/12/2022)  Social Connections: Socially Isolated (03/12/2022)  Tobacco Use: Medium Risk (04/16/2023)   SDOH Interventions:     Readmission Risk Interventions     No data to display

## 2023-04-17 NOTE — TOC Benefit Eligibility Note (Signed)
Patient Product/process development scientist completed.    The patient is insured through  [employer] . Patient has Medicare and is not eligible for a copay card, but may be able to apply for patient assistance, if available.    Ran test claim for Brilinta 90 mg and the current 30 day co-pay is $0.00.   This test claim was processed through [facility]- copay amounts may vary at other pharmacies due to pharmacy/plan contracts, or as the patient moves through the different stages of their insurance plan.     Roland Earl, CPHT Pharmacy Technician III Certified Patient Advocate [facility] Patient Advocate Team Direct Number: [phone]  Fax: [phone]

## 2023-04-17 NOTE — Progress Notes (Signed)
Admitted patient from 4N ICU. Pt is alert but no verbal output and does not follow commands. He is agitated and restless: safety precautions observed. Floor mats placed, bed alarms on. Vital signs taken and stable.

## 2023-04-17 NOTE — Progress Notes (Addendum)
STROKE TEAM PROGRESS NOTE   BRIEF HPI Casey Rangel is a 72 y.o. 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 Underwent mechanic thrombectomy of L MCA M1 (TICI 3) complicated by contrast stain in posterior L putamen.    SIGNIFICANT HOSPITAL EVENTS 8/29 presented to ED, mechanic thrombectomy of L MCA M1 (TICI 3) requiring rescue left MCA angioplasty and stenting complicated by contrast stain L sylvian fissure  INTERIM HISTORY/SUBJECTIVE Patient is intubated and cannot provide history. No family at bedside. Per nursing report, sister would like to be contacted for update.    OBJECTIVE  CBC    Component Value Date/Time   WBC 11.8 (H) 04/17/2023 0552   RBC 4.60 04/17/2023 0552   HGB 14.0 04/17/2023 0552   HCT 40.3 04/17/2023 0552   PLT 266 04/17/2023 0552   MCV 87.6 04/17/2023 0552   MCH 30.4 04/17/2023 0552   MCHC 34.7 04/17/2023 0552   RDW 13.3 04/17/2023 0552   LYMPHSABS 1.0 04/17/2023 0552   MONOABS 0.8 04/17/2023 0552   EOSABS 0.0 04/17/2023 0552   BASOSABS 0.0 04/17/2023 0552    BMET    Component Value Date/Time   NA 139 04/17/2023 0552   K 3.7 04/17/2023 0552   CL 107 04/17/2023 0552   CO2 21 (L) 04/17/2023 0552   GLUCOSE 104 (H) 04/17/2023 0552   BUN 11 04/17/2023 0552   CREATININE 1.22 04/17/2023 0552   CALCIUM 8.4 (L) 04/17/2023 0552   GFRNONAA >60 04/17/2023 0552    IMAGING past 24 hours DG Abd Portable 1V  Result Date: 04/17/2023 CLINICAL DATA:  Encounter for feeding tube placement. EXAM: PORTABLE ABDOMEN - 1 VIEW COMPARISON:  04/16/2023 FINDINGS: Feeding tube extends into the abdomen. The patient is rotated which makes it difficult to accurately evaluate the tip location. Suspect that the tip is near the distal stomach. Bandlike density at the left lung base is  suggestive for atelectasis. IMPRESSION: Feeding tube is likely in the distal stomach. Electronically Signed   By: Richarda Overlie M.D.   On: 04/17/2023 13:25   ECHOCARDIOGRAM COMPLETE  Result Date: 04/17/2023    ECHOCARDIOGRAM REPORT   Patient Name:   Casey Rangel Date of Exam: 04/17/2023 Medical Rec #:  [id number]      Height:       70.0 in Accession #:    [id number]     Weight:       168.4 lb Date of Birth:  October 28, 1950      BSA:          1.940 m Patient Age:    71 years       BP:           117/66 mmHg Patient Gender: M              HR:           78 bpm. Exam Location:  Inpatient Procedure: 2D Echo, Cardiac Doppler and Color Doppler Indications:    Stroke I63.9  History:        Patient has prior history of Echocardiogram examinations, most                 recent 01/16/2022. CHF, Stroke; Risk Factors:Hypertension and                 Dyslipidemia.  Sonographer:  Lucendia Herrlich Referring Phys: [id number] [facility] KHALIQDINA IMPRESSIONS  1. Left ventricular ejection fraction, by estimation, is 45 to 50%. Left ventricular ejection fraction by 2D MOD biplane is 47.8 %. The left ventricle has mildly decreased function. The left ventricle demonstrates global hypokinesis. There is moderate asymmetric left ventricular hypertrophy of the basal-septal segment. Left ventricular diastolic parameters are consistent with Grade I diastolic dysfunction (impaired relaxation).  2. Right ventricular systolic function is low normal. The right ventricular size is normal. There is normal pulmonary artery systolic pressure. The estimated right ventricular systolic pressure is 12.1 mmHg.  3. The mitral valve is abnormal. Trivial mitral valve regurgitation.  4. The aortic valve is grossly normal. Aortic valve regurgitation is trivial.  5. Aortic dilatation noted. There is moderate dilatation of the aortic root, measuring 45 mm. There is mild dilatation of the ascending aorta, measuring 44 mm.  6. The inferior vena cava is normal in size with  greater than 50% respiratory variability, suggesting right atrial pressure of 3 mmHg. Comparison(s): Changes from prior study are noted. 01/16/2022: LVEF 35-40%. FINDINGS  Left Ventricle: Left ventricular ejection fraction, by estimation, is 45 to 50%. Left ventricular ejection fraction by 2D MOD biplane is 47.8 %. The left ventricle has mildly decreased function. The left ventricle demonstrates global hypokinesis. The left ventricular internal cavity size was normal in size. There is moderate asymmetric left ventricular hypertrophy of the basal-septal segment. Left ventricular diastolic parameters are consistent with Grade I diastolic dysfunction (impaired relaxation). Indeterminate filling pressures. Right Ventricle: The right ventricular size is normal. No increase in right ventricular wall thickness. Right ventricular systolic function is low normal. There is normal pulmonary artery systolic pressure. The tricuspid regurgitant velocity is 1.51 m/s,  and with an assumed right atrial pressure of 3 mmHg, the estimated right ventricular systolic pressure is 12.1 mmHg. Left Atrium: Left atrial size was normal in size. Right Atrium: Right atrial size was normal in size. Pericardium: There is no evidence of pericardial effusion. Mitral Valve: The mitral valve is abnormal. Mild mitral annular calcification. Trivial mitral valve regurgitation. Tricuspid Valve: The tricuspid valve is grossly normal. Tricuspid valve regurgitation is trivial. Aortic Valve: The aortic valve is grossly normal. Aortic valve regurgitation is trivial. Aortic regurgitation PHT measures 242 msec. Aortic valve peak gradient measures 5.4 mmHg. Pulmonic Valve: The pulmonic valve was normal in structure. Pulmonic valve regurgitation is not visualized. Aorta: Aortic dilatation noted. There is moderate dilatation of the aortic root, measuring 45 mm. There is mild dilatation of the ascending aorta, measuring 44 mm. Venous: The inferior vena cava is normal  in size with greater than 50% respiratory variability, suggesting right atrial pressure of 3 mmHg. IAS/Shunts: No atrial level shunt detected by color flow Doppler.  LEFT VENTRICLE PLAX 2D                        Biplane EF (MOD) LVIDd:         5.90 cm         LV Biplane EF:   Left LVIDs:         4.10 cm                          ventricular LV PW:         1.00 cm                          ejection  LV IVS:        1.50 cm                          fraction by LVOT diam:     2.00 cm                          2D MOD LV SV:         51                               biplane is LV SV Index:   26                               47.8 %. LVOT Area:     3.14 cm                                Diastology                                LV e' medial:    6.22 cm/s LV Volumes (MOD)               LV E/e' medial:  9.3 LV vol d, MOD    139.8 ml      LV e' lateral:   12.00 cm/s A2C:                           LV E/e' lateral: 4.8 LV vol d, MOD    134.8 ml A4C: LV vol s, MOD    71.6 ml A2C: LV vol s, MOD    72.5 ml A4C: LV SV MOD A2C:   68.2 ml LV SV MOD A4C:   134.8 ml LV SV MOD BP:    64.6 ml RIGHT VENTRICLE             IVC RV S prime:     10.70 cm/s  IVC diam: 1.70 cm TAPSE (M-mode): 1.7 cm LEFT ATRIUM             Index        RIGHT ATRIUM           Index LA diam:        3.80 cm 1.96 cm/m   RA Area:     11.40 cm LA Vol (A2C):   31.3 ml 16.13 ml/m  RA Volume:   20.10 ml  10.36 ml/m LA Vol (A4C):   42.0 ml 21.65 ml/m LA Biplane Vol: 32.9 ml 16.96 ml/m  AORTIC VALVE AV Area (Vmax): 2.77 cm AV Vmax:        116.00 cm/s AV Peak Grad:   5.4 mmHg LVOT Vmax:      102.40 cm/s LVOT Vmean:     63.267 cm/s LVOT VTI:       0.163 m AI PHT:         242 msec  AORTA Ao Root diam: 4.50 cm Ao Asc diam:  4.40 cm MITRAL VALVE                TRICUSPID VALVE MV Area (PHT): 3.62 cm     TR Peak grad:   9.1  mmHg MV Decel Time: 210 msec     TR Vmax:        151.00 cm/s MV E velocity: 58.15 cm/s MV A velocity: 106.50 cm/s  SHUNTS MV E/A ratio:  0.55          Systemic VTI:  0.16 m                             Systemic Diam: 2.00 cm Zoila Shutter MD Electronically signed by Zoila Shutter MD Signature Date/Time: 04/17/2023/11:20:00 AM    Final    MR BRAIN WO CONTRAST  Result Date: 04/16/2023 CLINICAL DATA:  Acute neurologic deficit EXAM: MRI HEAD WITHOUT CONTRAST TECHNIQUE: Multiplanar, multiecho pulse sequences of the brain and surrounding structures were obtained without intravenous contrast. COMPARISON:  01/16/2022 FINDINGS: Brain: Multifocal acute ischemia within the left MCA territory. 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 No acute or chronic hemorrhage. There is confluent hyperintense T2-weighted signal within the white matter. Generalized volume loss. Multiple old small vessel infarcts of the corona radiata. The midline structures are normal. Vascular: Major flow voids are preserved. Skull and upper cervical spine: Normal calvarium and skull base. Visualized upper cervical spine and soft tissues are normal. Sinuses/Orbits:No paranasal sinus fluid levels or advanced mucosal thickening. No mastoid or middle ear effusion. Normal orbits. IMPRESSION: 1. Multifocal acute ischemia within the left MCA territory. No hemorrhage or mass effect. 2. 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 Electronically Signed   By: Deatra Robinson M.D.   On: 04/16/2023 23:25   DG Abd Portable 1V  Result Date: 04/16/2023 CLINICAL DATA:  NG placement. EXAM: PORTABLE ABDOMEN - 1 VIEW COMPARISON:  None Available. FINDINGS: Partially visualized enteric tube with side port at the GE junction and tip in the proximal stomach. Recommend further advancing by additional 6 cm for optimal positioning. IMPRESSION: Enteric tube with tip in the proximal stomach. Recommend further advancing by additional 6 cm for optimal positioning. Electronically Signed   By: Elgie Collard M.D.   On: 04/16/2023 21:07   CT HEAD WO CONTRAST ( )  Result Date: 04/16/2023 CLINICAL DATA:  Stroke  follow-up EXAM: CT HEAD WITHOUT CONTRAST TECHNIQUE: Contiguous axial images were obtained from the base of the skull through the vertex without intravenous contrast. RADIATION DOSE REDUCTION: This exam was performed according to the departmental dose-optimization program which includes automated exposure control, adjustment of the mA and/or kV according to patient size and/or use of iterative reconstruction technique. COMPARISON:  04/16/2023 FINDINGS: Brain: Status post placement of stent in the left MCA. There is hyperdense material in the left sylvian fissure. Unchanged appearance of infarcts of both superior parietal lobes, right occipital lobe and posterior left parietal lobe. Vascular: Atherosclerotic calcification of the internal carotid arteries at the skull base. No abnormal hyperdensity of the major intracranial arteries or dural venous sinuses. Skull: The visualized skull base, calvarium and extracranial soft tissues are normal. Sinuses/Orbits: No fluid levels or advanced mucosal thickening of the visualized paranasal sinuses. No mastoid or middle ear effusion. The orbits are normal. IMPRESSION: 1. Status post placement of stent in the left MCA. 2. Hyperdense material in the left Sylvian fissure, likely extravasated contrast. 3. Unchanged appearance of infarcts of both superior parietal lobes, right occipital lobe and posterior left parietal lobe. Electronically Signed   By: Deatra Robinson M.D.   On: 04/16/2023 18:59    Vitals:   04/17/23 1300 04/17/23 1400 04/17/23 1500 04/17/23 1600  BP: (!) 139/100 (!) 140/76 (!) 148/98 (!) 146/70  Pulse: 80 68 78 60  Resp: (!) 23 17 (!) 25 (!) 24  Temp:    98.5 F (36.9 C)  TempSrc:    Axillary  SpO2: 99% 100% 97% 98%  Weight:      Height:         PHYSICAL EXAM General: intubated CV: Regular rate and rhythm on monitor Respiratory:  synchronous with ventilator GI: Abdomen soft and nontender   NEURO:  Mental Status: intubated. Opens eyes and moves  extremities to painful stimuli. Somnolent.  Speech/Language: intubated/sedated cannot assess  Cranial Nerves:  II: PERRL. No R blink reflex to threat. L gaze deviation III, IV, VI: intubated/sedated cannot assess. Eyelids elevate symmetrically.  V: intubated/sedated cannot assess VII: intubated/sedated cannot assess VIII: intubated/sedated cannot assess IX, X: intubated/sedated cannot assess XI: intubated/sedated cannot assess XII: intubated/sedated cannot assess. Motor: R weakness compared to L with withdrawal to painful stimulus in both upper and lower extremities.  Spasticity and increased tone bilaterally right greater than left. Tone: increased RUE/RLE tone.  Reflexes: reflexes 3+ for R brachiocephalic  Sensation- Intact to light touch bilaterally. Extinction absent to light touch to DSS.   Coordination: FTN intact bilaterally, HKS: no ataxia in BLE.No drift.  Gait- deferred   ASSESSMENT/PLAN  Acute Ischemic Infarct:  left insular cortex due to left M2 MCA occlusion s/p mechanical thrombectomy and wrist to left MCA angioplasty and stenting Etiology:  large vessel disease  Code Stroke CT head Acute L MCA territory infarct involving L insula. Small vessel disease. ASPECTS 9.   CTA head & neck Acute occlusion of a proximal M2 left middle cerebral artery vessel CT perfusion mismatch ratio 2.0 CT head repeat s/p stent L MCA, Hyperdense material in the left Sylvian fissure, likely extravasated contrast. MRI  Multifocal acute ischemia within the left MCA territory. No hemorrhage or mass effect. 2D Echo EF 45-50%. Global hypokinesis of LV. Trivial mitral regurg. No intracardiac source of emoblism detected. Normal L atrium.  LDL 132 HgbA1c 5.2 VTE prophylaxis - lovenox 40 aspirin 81 mg daily prior to admission, now on aspirin 81 mg daily and Brilinta (ticagrelor) 90 mg bid for stent.  Therapy recommendations:  CIR Disposition:  pending  Hx of Stroke/TIA 2023: R side weakness,  aphasia, improved  Hypertension HFrEF Home meds:  coreg 3.125 twice daily, entresto 24-26 twice daily Stable Blood Pressure Goal: BP less than 180/105 for 24 hours then normotensive tomorrow afternoon.   Hyperlipidemia Home meds:  none, resumed in hospital LDL 132, goal < 70 Add crestor 20, increase as tolerated  Continue statin at discharge  Tobacco Abuse Former smoker, 45 pack years  Dysphagia Patient has post-stroke dysphagia, SLP consulted    Diet   Diet NPO time specified   Advance diet as tolerated  Other Stroke Risk Factors ETOH use, alcohol level <10, advised to drink no more than 2 drink(s) a day Congestive heart failure   Other Active Problems Leukocytosis, mild, will trend  Hospital day # 1  Meryl Dare, MD PGY-1 Psychiatry Resident 04/17/2023, 4:28 PM  STROKE MD NOTE :  I have personally obtained history,examined this patient, reviewed notes, independently viewed imaging studies, participated in medical decision making and plan of care.ROS completed by me personally and pertinent positives fully documented  I have made any additions or clarifications directly to the above note. Agree with note above.  Patient with prior history of multiple strokes including known left MCA stenosis presented with aphasia  Ridinger paresis due to left M2 occlusion and underwent successful mechanical thrombectomy requiring distal left MCA angioplasty and stenting.  Recommend close neurological observation and strict blood pressure control as per postlhrombectomy protocol..Continue aspirin and Brilinta for left MCA stent.  Discussed with Dr. Johny Drilling and critical care medicine.  Discussed with Dr. Corliss Skains.  No family at the bedside This patient is critically ill and at significant risk of neurological worsening, death and care requires constant monitoring of vital signs, hemodynamics,respiratory and cardiac monitoring, extensive review of multiple databases, frequent neurological  assessment, discussion with family, other specialists and medical decision making of high complexity.I have made any additions or clarifications directly to the above note.This critical care time does not reflect procedure time, or teaching time or supervisory time of PA/NP/Med Resident etc but could involve care discussion time.  I spent 30 minutes of neurocritical care time  in the care of  this patient.     Delia Heady, MD Medical Director [facility] Pager: [phone] 04/17/2023 4:35 PM  To contact Stroke Continuity provider, please refer to [email or web address]. After hours, contact General Neurology

## 2023-04-17 NOTE — Evaluation (Signed)
Physical Therapy Evaluation Patient Details Name: Casey Rangel MRN: [id number] DOB: 1951-02-11 Today's Date: 04/17/2023  History of Present Illness  Pt is a 72 y/o male presenting on 8/29 with L gaze, R sided weakness, and global aphasia. CT with concern for L MCA CVA, CT angiogram with occlusion of proximal L MCA M2. S/P thrombectomy 8/29. PMH L hip fx ; T9-11 fusion, L MCA CVA with residual aphasia, recent small volume SDH 01/2023., alzheimer's dementia.   Clinical Impression  Casey Rangel is 72 y.o. male admitted with above HPI and diagnosis. Patient is currently limited by functional impairments below (see PT problem list). Patient unable to provide home set up and PLOF due to cognitive/communication deficits; per chart review pt ws ambulatory with RW and at mod ind level at baseline. Currently he requires +2 mod assist with multimodal cues for sequencing bed mobility and max +2 for transfers with manual facilitation to weight shift Rt/Lt for stepping bed>chair. Throughout session pt attending to Lt side and max cuing needed to attend to Rt  but pt able to track head and eyes to Rt side with cues. Pt positioned in chair and angled towards window to encourage visual scanning Rt. Patient will benefit from continued skilled PT interventions to address impairments and progress independence with mobility. Patient will benefit from intensive inpatient follow up therapy, >3 hours/day. Acute PT will follow and progress as able.         If plan is discharge home, recommend the following: Two people to help with walking and/or transfers;Two people to help with bathing/dressing/bathroom;Assistance with cooking/housework;Direct supervision/assist for medications management;Assist for transportation;Help with stairs or ramp for entrance;Supervision due to cognitive status;Assistance with feeding   Can travel by private vehicle        Equipment Recommendations Other (comment) (TBD, defer to next venue)   Recommendations for Other Services  Rehab consult    Functional Status Assessment Patient has had a recent decline in their functional status and/or demonstrates limited ability to make significant improvements in function in a reasonable and predictable amount of time     Precautions / Restrictions Precautions Precautions: Fall Precaution Comments: aphasia Restrictions Weight Bearing Restrictions: No      Mobility  Bed Mobility Overal bed mobility: Needs Assistance Bed Mobility: Rolling, Sidelying to Sit Rolling: Mod assist, +2 for physical assistance, +2 for safety/equipment Sidelying to sit: Mod assist, +2 for safety/equipment, +2 for physical assistance, HOB elevated       General bed mobility comments: pt using L UE and LEs to support transfer to EOB, cueing for sequencing and technique.    Transfers Overall transfer level: Needs assistance Equipment used: 2 person hand held assist Transfers: Sit to/from Stand, Bed to chair/wheelchair/BSC Sit to Stand: Max assist, +2 safety/equipment, +2 physical assistance Stand pivot transfers: Max assist, +2 physical assistance, +2 safety/equipment         General transfer comment: increased assist to power up into standing, once upright able to maintain balance with up to mod assist; pivoting towards L side with max assist for weightshiting, sequencing and balance    Ambulation/Gait                  Stairs            Wheelchair Mobility     Tilt Bed    Modified Rankin (Stroke Patients Only)       Balance Overall balance assessment: Needs assistance Sitting-balance support: No upper extremity supported, Feet supported, Single extremity supported Sitting  balance-Leahy Scale: Fair Sitting balance - Comments: statically preferece to L UE support   Standing balance support: Bilateral upper extremity supported, During functional activity Standing balance-Leahy Scale: Poor Standing balance comment: relies  on BUE and external support                             Pertinent Vitals/Pain Pain Assessment Pain Assessment: Faces Faces Pain Scale: No hurt Pain Intervention(s): Monitored during session    Home Living Family/patient expects to be discharged to:: Private residence Living Arrangements: Other relatives Available Help at Discharge: Family Type of Home: Mobile home Home Access: Stairs to enter Entrance Stairs-Rails: Doctor, general practice of Steps: 6   Home Layout: One level Home Equipment: Agricultural consultant (2 wheels);Shower seat Additional Comments: home setup from prior admission, pt unable to report    Prior Function Prior Level of Function : Patient poor historian/Family not available             Mobility Comments: per last admission was independnet, walking with a limp ADLs Comments: per last admission, pt was having difficulty with IADls but able to manage ADLs; sister reports pt not managing meds well in notes from this admission     Extremity/Trunk Assessment   Upper Extremity Assessment Upper Extremity Assessment: Defer to OT evaluation RUE Deficits / Details: pt with chronic deficits, flexion synergy patterns and not using UE functionally RUE Sensation: decreased light touch;decreased proprioception RUE Coordination: decreased fine motor;decreased gross motor LUE Deficits / Details: grossly 3/5 MMT, able to follow commands and use UE some to engage in ADLs/transfers. difficult to assess due to commuincatin/cognition LUE Coordination: decreased fine motor;decreased gross motor    Lower Extremity Assessment Lower Extremity Assessment: RLE deficits/detail;LLE deficits/detail;Difficult to assess due to impaired cognition (assessment limited by impaired cognition and communication) RLE Deficits / Details: grossly 3-/5 or less, pt able to follow some cues/commands for active movement but inconsistent. functional weak with knee flexed in stance and  buckling without support/blocking from therapist in standing. RLE Coordination: decreased fine motor;decreased gross motor LLE Deficits / Details: grossly 3/5 with active movement and functional transfers. pt able to follow some cues/commands for active movement but inconsistent. LLE Coordination: decreased fine motor;decreased gross motor    Cervical / Trunk Assessment Cervical / Trunk Assessment: Kyphotic  Communication   Communication Communication: Difficulty following commands/understanding;Hearing impairment;Difficulty communicating thoughts/reduced clarity of speech Following commands: Follows one step commands inconsistently;Follows one step commands with increased time Cueing Techniques: Verbal cues;Tactile cues;Gestural cues;Visual cues  Cognition Arousal: Alert Behavior During Therapy: Flat affect Overall Cognitive Status: Difficult to assess                                 General Comments: pt following some simple commands with increased time but decreased awareness, probelm sovling. Preference to L gaze, but able to scan when cued.        General Comments General comments (skin integrity, edema, etc.): VSS on RA    Exercises     Assessment/Plan    PT Assessment Patient needs continued PT services  PT Problem List         PT Treatment Interventions      PT Goals (Current goals can be found in the Care Plan section)  Acute Rehab PT Goals Patient Stated Goal: none stated at this time PT Goal Formulation: Patient unable to participate in goal  setting Time For Goal Achievement: 05/01/23 Potential to Achieve Goals: Fair    Frequency       Co-evaluation               AM-PAC PT "6 Clicks" Mobility  Outcome Measure                  End of Session Equipment Utilized During Treatment: Gait belt Activity Tolerance: Patient tolerated treatment well Patient left: in chair;with call bell/phone within reach;with chair alarm set Nurse  Communication: Mobility status;Need for lift equipment (+2 or lift bed>chair) PT Visit Diagnosis: Unsteadiness on feet (R26.81);Other abnormalities of gait and mobility (R26.89);Muscle weakness (generalized) (M62.81);Difficulty in walking, not elsewhere classified (R26.2);Other symptoms and signs involving the nervous system (R29.898);Hemiplegia and hemiparesis Hemiplegia - Right/Left: Right    Time: 6962-9528 PT Time Calculation (min) (ACUTE ONLY): 30 min   Charges:   PT Evaluation $PT Eval Moderate Complexity: 1 Mod   PT General Charges $$ ACUTE PT VISIT: 1 Visit         Wynn Maudlin, DPT Acute Rehabilitation Services Office [phone]  04/17/23 3:06 PM

## 2023-04-17 NOTE — Anesthesia Postprocedure Evaluation (Signed)
Anesthesia Post Note  Patient: Casey Rangel  Procedure(s) Performed: IR WITH ANESTHESIA     Patient location during evaluation: SICU Anesthesia Type: General Level of consciousness: sedated Pain management: pain level controlled Vital Signs Assessment: post-procedure vital signs reviewed and stable Respiratory status: patient remains intubated per anesthesia plan Cardiovascular status: stable Postop Assessment: no apparent nausea or vomiting Anesthetic complications: no   No notable events documented.  Last Vitals:  Vitals:   04/17/23 1200 04/17/23 1300  BP: (!) 145/73 (!) 139/100  Pulse: 68 80  Resp: 19 (!) 23  Temp: 37.3 C   SpO2: 98% 99%    Last Pain:  Vitals:   04/17/23 1200  TempSrc: Axillary  PainSc:                  Jessup Ogas

## 2023-04-17 NOTE — Progress Notes (Signed)
Echocardiogram 2D Echocardiogram has been performed.  Casey Rangel 04/17/2023, 9:18 AM

## 2023-04-17 NOTE — Progress Notes (Signed)
NAME:  Casey Rangel, MRN:  [id number], DOB:  1951/07/24, LOS: 1 ADMISSION DATE:  04/16/2023, CONSULTATION DATE: 8/29 REFERRING MD: Dr. Derry Lory, CHIEF COMPLAINT: CVA  History of Present Illness:  72 year old male with past medical history as below, which is significant for Alzheimer's dementia (followed by outpatient neurology and memory care facility was recommended), previous stroke in May 2023 with mild to moderate aphasia residual, bilateral small subdural hematoma in June 2024: HFrEF(LVEF 35 to 40%), hypertension, and hyperlipidemia who presented to [facility] emergency department on 8/29 as a code stroke.  After his previous stroke he did recover some movement of his right side and some language.  He was able to walk, shower, and clean up after himself without assistance.  He was found by his sister to have right-sided weakness, aphasia, and leftward gaze deviation.  Last known well 9 PM on 8/28.  CT scan of the ED showed concern for developing left MCA stroke.  CT angiogram showed occlusion of the proximal left MCA M2.  The patient was outside window for thrombolytics but was taken to interventional radiology for endovascular procedure.  Initial revascularization of the left MCA with 1 pass and achieving TICI 3, however, the vessel reoccluded.  This happened 2 additional times and ultimately stent was placed.  Due to poor mental status prior to intubation he was left on the mechanical ventilator postoperatively and was transferred to the ICU for ongoing care.  Pertinent  Medical History   has a past medical history of GERD (gastroesophageal reflux disease), Hiatal hernia, and Stroke (HCC).   Significant Hospital Events: Including procedures, antibiotic start and stop dates in addition to other pertinent events   CT head 8/29 > Acute left MCA territory infarct involving the left insula, trace chronic subdural hematoma  CTA head 8/29 >  Acute occlusion of a proximal M2 left middle cerebral  artery vessel (at its origin). Redemonstrated moderate stenosis within the left middle cerebral artery mid M1 segment.  Interim History / Subjective:  - 500 ml I/O Afebrile  MRI Brain: multifocal acute ischemia within the left MCA territory, old right PCA and right MCA infarcts   Objective   Blood pressure 117/66, pulse 88, temperature 98.6 F (37 C), temperature source Axillary, resp. rate 17, height 5\' 10"  (1.778 m), weight 76.4 kg, SpO2 100%.    Vent Mode: PRVC FiO2 (%):  [28 %-60 %] 28 % Set Rate:  [16 bmp] 16 bmp Vt Set:  [580 mL] 580 mL PEEP:  [5 cmH20] 5 cmH20 Plateau Pressure:  [16 cmH20] 16 cmH20   Intake/Output Summary (Last 24 hours) at 04/17/2023 0729 Last data filed at 04/17/2023 0700 Gross per 24 hour  Intake 2341.42 ml  Output 2850 ml  Net -508.58 ml   Filed Weights   04/16/23 1000  Weight: 76.4 kg    Examination: General: Frail adult male, resting in bed.  HENT: Normocephalic, atraumatic Lungs: Clear bilateral breath sounds Cardiovascular: Regular rate and rhythm, no MRG Abdomen: Soft, nondistended, non tender Extremities: No acute deformities or range of motion imitations.  +1 pedal pulses bilaterally Neuro: Opens eyes to voice, takes prompting but follows commands on the left.  GU: Foley  Resolved Hospital Problem list     Assessment & Plan:   Acute CVA secondary to left MCA M1 occlusion.  Status post endovascular revascularization and stent placement on 8/29. Prior stroke 12/2021 left ICA with residual R weakness and aphasia  -Management per neurology/stroke service -Systolic blood pressure goal 120 to 140  mmHg -Secondary stroke work up: Echocardiogram, Lipid panel, HgbA1C pending -ASA/Brilinta -  Acute respiratory failure with hypoxia: due to obtundation prior to intubation  - Full vent support - CXR/ABG now - VAP bundle - Assess for SBT in AM - Continue propofol, PRN fentanyl for RASS goal 0 to -1.   Chronic HFrEF Hypertension  - Echo  pending - Holding entresto  - Start home coreg - Cleviprex for BP control.   Leukocytosis: reactive to CVA vs aspiration.  - Down trending WBC - UA unremarkable - Hold antibiotics for now - Repeat CXR tomorrow  Alzheimer's dementia - holding home Seroquel  - Resume home aricept.   Hypokalemia - replaced    Best Practice (right click and "Reselect all SmartList Selections" daily)   Diet/type: NPO Nutrition consult for tube feeds and cortrak  DVT prophylaxis: LMWH GI prophylaxis: N/A Lines: Arterial Line to be discontinued once off cleviprex  Foley:  Yes, and it is still needed Code Status:  DNR Last date of multidisciplinary goals of care discussion [ 8/29]  Labs   CBC: Recent Labs  Lab 04/16/23 0958 04/16/23 1006 04/16/23 1100 04/16/23 1455 04/17/23 0552  WBC 12.9*  --  12.3*  --  11.8*  NEUTROABS 10.9*  --   --   --  9.9*  HGB 15.5 15.0 15.2 13.6 14.0  HCT 45.4 44.0 44.5 40.0 40.3  MCV 89.4  --  89.5  --  87.6  PLT 287  --  259  --  266    Basic Metabolic Panel: Recent Labs  Lab 04/16/23 1006 04/16/23 1100 04/16/23 1455 04/17/23 0552  NA 138 135 140 139  K 5.4* 3.3* 4.0 3.7  CL 107 103  --  107  CO2  --  20*  --  21*  GLUCOSE 98 93  --  104*  BUN 32* 21  --  11  CREATININE 1.00 1.11  --  1.22  CALCIUM  --  8.4*  --  8.4*  MG  --   --   --  2.2  PHOS  --   --   --  2.7   GFR: Estimated Creatinine Clearance: 57.3 mL/min (by C-G formula based on SCr of 1.22 mg/dL). Recent Labs  Lab 04/16/23 0958 04/16/23 1100 04/17/23 0552  WBC 12.9* 12.3* 11.8*    Liver Function Tests: Recent Labs  Lab 04/16/23 1100 04/17/23 0552  AST 20 23  ALT 12 14  ALKPHOS 55 52  BILITOT 1.3* 1.1  PROT 6.3* 6.2*  ALBUMIN 3.5 3.2*   No results for input(s): "LIPASE", "AMYLASE" in the last 168 hours. No results for input(s): "AMMONIA" in the last 168 hours.  ABG    Component Value Date/Time   PHART 7.310 (L) 04/16/2023 1455   PCO2ART 39.7 04/16/2023 1455    PO2ART 150 (H) 04/16/2023 1455   HCO3 20.0 04/16/2023 1455   TCO2 21 (L) 04/16/2023 1455   ACIDBASEDEF 6.0 (H) 04/16/2023 1455   O2SAT 99 04/16/2023 1455     Coagulation Profile: Recent Labs  Lab 04/16/23 1100  INR 1.1    Cardiac Enzymes: No results for input(s): "CKTOTAL", "CKMB", "CKMBINDEX", "TROPONINI" in the last 168 hours.  HbA1C: Hgb A1c MFr Bld  Date/Time Value Ref Range Status  04/16/2023 11:00 AM 5.2 4.8 - 5.6 % Final    Comment:    (NOTE) Pre diabetes:          5.7%-6.4%  Diabetes:              >  6.4%  Glycemic control for   <7.0% adults with diabetes   01/16/2022 04:49 AM 5.1 4.8 - 5.6 % Final    Comment:    (NOTE) Pre diabetes:          5.7%-6.4%  Diabetes:              >6.4%  Glycemic control for   <7.0% adults with diabetes     CBG: Recent Labs  Lab 04/16/23 0958  GLUCAP 103*    Critical care time: 35 minutes     Earney Hamburg, AGACNP-BC [facility]  Please use secure chat  PCCM on call pager [phone] until 7pm. Please call Elink 7p-7a. [phone]  04/17/2023 7:29 AM      ,

## 2023-04-17 NOTE — Patient Instructions (Signed)
Visit Information  Thank you for taking time to visit with me today. Please don't hesitate to contact me if I can be of assistance to you.   Following are the goals we discussed today:  Please continue to follow up with the Transitions of Care Team regarding discharge plan for patient   Our next appointment is by telephone on 04/23/23 at 1pm  Please call the care guide team at [phone] if you need to cancel or reschedule your appointment.   If you are experiencing a Mental Health or Behavioral Health Crisis or need someone to talk to, please call [phone]   Patient verbalizes understanding of instructions and care plan provided today and agrees to view in MyChart. Active MyChart status and patient understanding of how to access instructions and care plan via MyChart confirmed with patient.     Telephone follow up appointment with care management team member scheduled for: 04/23/23  Verna Czech, LCSW Clinical Social Worker  [phone]

## 2023-04-17 NOTE — Evaluation (Signed)
Occupational Therapy Evaluation Patient Details Name: Casey Rangel MRN: [id number] DOB: 10-18-1950 Today's Date: 04/17/2023   History of Present Illness Pt is a 72 y/o male presenting on 8/29 with L gaze, R sided weakness, and global aphasia. CT with concern for L MCA CVA, CT angiogram with occlusion of proximal L MCA M2. S/P thrombectomy 8/29. PMH L hip fx ; T9-11 fusion, L MCA CVA with residual aphasia, recent small volume SDH 01/2023., alzheimer's dementia.   Clinical Impression   PTA patient independent with ADLs, mobility but having difficulty with IADLs (per last admission and chart review as pt unable to report).  Admitted for above and presents with problem list below.  He has L gaze and head turn preference, but able to scan/track with increased time and cueing.  He is following some commands with increased time, but not verbalizing.  He is highly distractible and is not attending to R side without maximal cueing.  He requires max-total assist for Adls, max +2 for transfers and mod assist +2 for bed mobility.   Further visual and cognitive assessment needed, limited due to aphasia.  Based on performance today, believe pt will best benefit from continued OT services acutely and after dc at an inpatient setting with >3hrs/day to optimize independence, safety and return to PLOF with ADLs and mobility.      If plan is discharge home, recommend the following: Two people to help with walking and/or transfers;A lot of help with bathing/dressing/bathroom;Assistance with cooking/housework;Direct supervision/assist for medications management;Direct supervision/assist for financial management;Assist for transportation;Help with stairs or ramp for entrance    Functional Status Assessment  Patient has had a recent decline in their functional status and demonstrates the ability to make significant improvements in function in a reasonable and predictable amount of time.  Equipment Recommendations  Other  (comment) (defer)    Recommendations for Other Services Rehab consult     Precautions / Restrictions Precautions Precautions: Fall Precaution Comments: aphasia Restrictions Weight Bearing Restrictions: No      Mobility Bed Mobility Overal bed mobility: Needs Assistance Bed Mobility: Rolling, Sidelying to Sit Rolling: Mod assist, +2 for physical assistance, +2 for safety/equipment Sidelying to sit: Mod assist, +2 for safety/equipment, +2 for physical assistance, HOB elevated       General bed mobility comments: pt using L UE and LEs to support transfer to EOB, cueing for sequencing and technique.    Transfers Overall transfer level: Needs assistance Equipment used: 2 person hand held assist Transfers: Sit to/from Stand, Bed to chair/wheelchair/BSC Sit to Stand: Max assist, +2 safety/equipment, +2 physical assistance Stand pivot transfers: Max assist, +2 physical assistance, +2 safety/equipment         General transfer comment: increased assist to power up into standing, once upright able to maintain balance with up to mod assist; pivoting towards L side with max assist for weightshiting, sequencing and balance      Balance Overall balance assessment: Needs assistance Sitting-balance support: No upper extremity supported, Feet supported, Single extremity supported Sitting balance-Leahy Scale: Fair Sitting balance - Comments: statically preferece to L UE support   Standing balance support: Bilateral upper extremity supported, During functional activity Standing balance-Leahy Scale: Poor Standing balance comment: relies on BUE and external support                           ADL either performed or assessed with clinical judgement   ADL Overall ADL's : Needs assistance/impaired  Grooming: Maximal assistance;Sitting           Upper Body Dressing : Maximal assistance;Sitting   Lower Body Dressing: Total assistance;+2 for physical assistance;+2 for  safety/equipment;Sit to/from stand   Toilet Transfer: Maximal assistance;+2 for physical assistance;+2 for safety/equipment;Stand-pivot Toilet Transfer Details (indicate cue type and reason): to recliner         Functional mobility during ADLs: Moderate assistance;Maximal assistance;+2 for physical assistance;+2 for safety/equipment;Cueing for safety;Cueing for sequencing       Vision   Vision Assessment?: Vision impaired- to be further tested in functional context Additional Comments: pt with L gaze preference, able to turn head with support to R side.  Highly distractable on L side but able to scan to R when distractions removed.  Able to track therapist with increased time. Continue assessment     Perception         Praxis         Pertinent Vitals/Pain Pain Assessment Pain Assessment: Faces Faces Pain Scale: No hurt Pain Intervention(s): Monitored during session     Extremity/Trunk Assessment Upper Extremity Assessment Upper Extremity Assessment: RUE deficits/detail;LUE deficits/detail RUE Deficits / Details: pt with chronic deficits, flexion synergy patterns and not using UE functionally RUE Sensation: decreased light touch;decreased proprioception RUE Coordination: decreased fine motor;decreased gross motor LUE Deficits / Details: grossly 3/5 MMT, able to follow commands and use UE some to engage in ADLs/transfers. difficult to assess due to commuincatin/cognition LUE Coordination: decreased fine motor;decreased gross motor   Lower Extremity Assessment Lower Extremity Assessment: Defer to PT evaluation       Communication Communication Communication: Difficulty following commands/understanding;Hearing impairment Following commands: Follows one step commands inconsistently;Follows one step commands with increased time Cueing Techniques: Verbal cues;Tactile cues;Visual cues;Gestural cues   Cognition Arousal: Alert Behavior During Therapy: Flat affect Overall  Cognitive Status: Difficult to assess                                 General Comments: pt following some simple commands with increased time but decreased awareness, probelm sovling. Preference to L gaze, but able to scan when cued.     General Comments  VSS on RA    Exercises     Shoulder Instructions      Home Living Family/patient expects to be discharged to:: Private residence Living Arrangements: Other relatives Available Help at Discharge: Family Type of Home: Mobile home Home Access: Stairs to enter Entrance Stairs-Number of Steps: 6 Entrance Stairs-Rails: Right;Left Home Layout: One level     Bathroom Shower/Tub: Chief Strategy Officer: Standard     Home Equipment: Agricultural consultant (2 wheels);Shower seat   Additional Comments: home setup from prior admission, pt unable to report      Prior Functioning/Environment Prior Level of Function : Patient poor historian/Family not available             Mobility Comments: per last admission was independnet, walking with a limp ADLs Comments: per last admission, pt was having difficulty with IADls but able to manage ADLs; sister reports pt not managing meds well in notes from this admission        OT Problem List: Decreased strength;Decreased activity tolerance;Decreased range of motion;Impaired vision/perception;Impaired balance (sitting and/or standing);Decreased coordination;Decreased cognition;Decreased safety awareness;Decreased knowledge of use of DME or AE;Decreased knowledge of precautions;Impaired sensation;Impaired tone;Obesity;Impaired UE functional use      OT Treatment/Interventions: Self-care/ADL training;Neuromuscular education;DME and/or AE instruction;Splinting;Therapeutic activities;Cognitive  remediation/compensation;Patient/family education;Balance training;Visual/perceptual remediation/compensation    OT Goals(Current goals can be found in the care plan section) Acute Rehab  OT Goals Patient Stated Goal: unable OT Goal Formulation: Patient unable to participate in goal setting Time For Goal Achievement: 05/01/23 Potential to Achieve Goals: Fair  OT Frequency: Min 1X/week    Co-evaluation              AM-PAC OT "6 Clicks" Daily Activity     Outcome Measure Help from another person eating meals?: Total Help from another person taking care of personal grooming?: A Lot Help from another person toileting, which includes using toliet, bedpan, or urinal?: A Lot Help from another person bathing (including washing, rinsing, drying)?: A Lot Help from another person to put on and taking off regular upper body clothing?: A Lot Help from another person to put on and taking off regular lower body clothing?: Total 6 Click Score: 10   End of Session Equipment Utilized During Treatment: Gait belt Nurse Communication: Mobility status;Precautions  Activity Tolerance: Patient tolerated treatment well Patient left: in chair;with call bell/phone within reach;with chair alarm set  OT Visit Diagnosis: Other abnormalities of gait and mobility (R26.89);Muscle weakness (generalized) (M62.81);Hemiplegia and hemiparesis;Other symptoms and signs involving cognitive function;Cognitive communication deficit (R41.841) Symptoms and signs involving cognitive functions: Cerebral infarction Hemiplegia - Right/Left: Right Hemiplegia - caused by: Cerebral infarction (chronic)                Time: 8295-6213 OT Time Calculation (min): 32 min Charges:  OT General Charges $OT Visit: 1 Visit OT Evaluation $OT Eval Moderate Complexity: 1 Mod  Barry Brunner, OT Acute Rehabilitation Services Office [phone]   Chancy Milroy 04/17/2023, 1:48 PM

## 2023-04-17 NOTE — Patient Outreach (Addendum)
Care Coordination   Follow Up Visit Note   04/17/2023 Name: Casey Rangel MRN: [id number] DOB: 1950/09/01  Casey Rangel is a 72 y.o. year old male who sees Mort Sawyers, FNP for primary care. I spoke with  Casey Rangel by phone today.  What matters to the patients health and wellness today?  Patient currently     Goals Addressed             This Visit's Progress    care coordination activities       Interventions Today    Flowsheet Row Most Recent Value  Chronic Disease   Chronic disease during today's visit Other  [stroke]  General Interventions   General Interventions Discussed/Reviewed General Interventions Reviewed  [patient's current medical status discussed-patient's sister requesting clarification regarding discharge plan from the hospital-this social worker encouraged sister to contact the [facility] department to confirm discharge plan to rehab]              SDOH assessments and interventions completed:  No     Care Coordination Interventions:  Yes, provided   Follow up plan: Follow up call scheduled for 04/23/23    Encounter Outcome:  Pt. Visit Completed

## 2023-04-18 ENCOUNTER — Inpatient Hospital Stay (HOSPITAL_COMMUNITY): Payer: Medicare Other

## 2023-04-18 LAB — CBC
HCT: 45.5 % (ref 39.0–52.0)
Hemoglobin: 15.9 g/dL (ref 13.0–17.0)
MCH: 31.5 pg (ref 26.0–34.0)
MCHC: 34.9 g/dL (ref 30.0–36.0)
MCV: 90.1 fL (ref 80.0–100.0)
Platelets: 293 10*3/uL (ref 150–400)
RBC: 5.05 MIL/uL (ref 4.22–5.81)
RDW: 13.2 % (ref 11.5–15.5)
WBC: 11.1 10*3/uL — ABNORMAL HIGH (ref 4.0–10.5)
nRBC: 0 % (ref 0.0–0.2)

## 2023-04-18 LAB — GLUCOSE, CAPILLARY
Glucose-Capillary: 103 mg/dL — ABNORMAL HIGH (ref 70–99)
Glucose-Capillary: 113 mg/dL — ABNORMAL HIGH (ref 70–99)
Glucose-Capillary: 121 mg/dL — ABNORMAL HIGH (ref 70–99)
Glucose-Capillary: 96 mg/dL (ref 70–99)

## 2023-04-18 LAB — PHOSPHORUS: Phosphorus: 2.7 mg/dL (ref 2.5–4.6)

## 2023-04-18 LAB — BASIC METABOLIC PANEL
Anion gap: 11 (ref 5–15)
BUN: 16 mg/dL (ref 8–23)
CO2: 22 mmol/L (ref 22–32)
Calcium: 8.6 mg/dL — ABNORMAL LOW (ref 8.9–10.3)
Chloride: 103 mmol/L (ref 98–111)
Creatinine, Ser: 1.04 mg/dL (ref 0.61–1.24)
GFR, Estimated: >60 mL/min (ref >60)
Glucose, Bld: 89 mg/dL (ref 70–99)
Potassium: 3.9 mmol/L (ref 3.5–5.1)
Sodium: 136 mmol/L (ref 135–145)

## 2023-04-18 LAB — MAGNESIUM: Magnesium: 2.3 mg/dL (ref 1.7–2.4)

## 2023-04-18 MED ORDER — OSMOLITE 1.5 CAL PO LIQD
1000.0000 mL | ORAL | Status: DC
  Administered 2023-04-18 – 2023-04-19 (×2): 1000 mL

## 2023-04-18 MED ORDER — FREE WATER
145.0000 mL | Status: DC
  Administered 2023-04-18 – 2023-04-19 (×5): 145 mL

## 2023-04-18 MED ORDER — PROSOURCE TF20 ENFIT COMPATIBL EN LIQD
60.0000 mL | Freq: Every day | ENTERAL | Status: DC
  Administered 2023-04-18 – 2023-04-21 (×3): 60 mL
  Filled 2023-04-18 (×3): qty 60

## 2023-04-18 NOTE — Progress Notes (Signed)
Initial Nutrition Assessment  DOCUMENTATION CODES:   Not applicable  INTERVENTION:  Initiate tube feeding via Cortrak: Start Osmolite 1.5 at 20ml and advance by 10ml q8h to a goal rate of 50 ml/h (1200 ml per day) Prosource TF20 60 ml once daily Free water flushes q4h  Provides 1880 kcal, 95 gm protein, 1784 ml total free water daily (TF + FWF)  NUTRITION DIAGNOSIS:   Inadequate oral intake related to inability to eat as evidenced by NPO status.  GOAL:   Patient will meet greater than or equal to 90% of their needs  MONITOR:   Diet advancement, Labs, Weight trends, TF tolerance  REASON FOR ASSESSMENT:   Consult Enteral/tube feeding initiation and management  ASSESSMENT:   Pt admitted with R sided weakness, L gaze deviation and global aphasia d/t acute L MCA stroke. 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   8/29 - s/p mechanical thrombectomy L MCA + stent placement 8/20 - Cortrak placed (tip gastric)  RD working remotely. Unable to reach pt via phone call to room. Will gather more detailed nutrition related history on follow up. Pt noted to have support of his sister prior to admission with groceries and meals.   Suspect pt may have a degree of malnutrition in the setting of dementia, recent SDH and sisters report of decline prior to admission.   Reviewed weight history. Although limited over the last year, it does not appear pt has had significant weight changes. Weights documented between 75-78 kg since June 2023.   Medications and labs reviewed  NUTRITION - FOCUSED PHYSICAL EXAM: RD working remotely. Deferred to follow up.   Diet Order:   Diet Order             Diet NPO time specified  Diet effective now                   EDUCATION NEEDS:   No education needs have been identified at this time  Skin:  Skin Assessment: Reviewed RN  Assessment  Last BM:  PTA  Height:   Ht Readings from Last 1 Encounters:  04/16/23 5\' 10"  (1.778 m)    Weight:   Wt Readings from Last 1 Encounters:  04/16/23 76.4 kg   BMI:  Body mass index is 24.17 kg/m.  Estimated Nutritional Needs:   Kcal:  1800-2000  Protein:  90-105g  Fluid:  >/=1.8L  Drusilla Kanner, RDN, LDN Clinical Nutrition

## 2023-04-18 NOTE — Progress Notes (Addendum)
1700 Patient pulled out the cortrak  1715: paged MD for NG tube placement and placed double lumen 16 Fr NG tube, fixed at 55 cm at left nare, patient tolerated well, waiting for an abd  X-ray to confirm placement  1932: Confirmed NG placement by abd x-ray, restarted feeding @30ml / hour.

## 2023-04-18 NOTE — Progress Notes (Signed)
Inpatient Rehab Admissions Coordinator:   I spoke with Pt.'s sister over the phone to discuss potential CIR admit. She states she was struggling to care for Pt. Prior to admission and that she cannot manage his care post stroke. She would like SNF placement in [place]. CIR will sign off.   Megan Salon, MS, CCC-SLP Rehab Admissions Coordinator  [phone] (celll) [phone] (office)

## 2023-04-18 NOTE — Progress Notes (Signed)
STROKE TEAM PROGRESS NOTE   BRIEF HPI Casey Rangel is a 72 y.o. 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 Underwent mechanic thrombectomy of L MCA M1 (TICI 3) complicated by contrast stain in posterior L putamen.    SIGNIFICANT HOSPITAL EVENTS 8/29 presented to ED, mechanic thrombectomy of L MCA M1 (TICI 3) requiring rescue left MCA angioplasty and stenting complicated by contrast stain L sylvian fissure  INTERIM HISTORY/SUBJECTIVE Patient is intubated and cannot provide history. No family at bedside. Per nursing report, sister would like to be contacted for update.    OBJECTIVE  CBC    Component Value Date/Time   WBC 11.1 (H) 04/18/2023 0520   RBC 5.05 04/18/2023 0520   HGB 15.9 04/18/2023 0520   HCT 45.5 04/18/2023 0520   PLT 293 04/18/2023 0520   MCV 90.1 04/18/2023 0520   MCH 31.5 04/18/2023 0520   MCHC 34.9 04/18/2023 0520   RDW 13.2 04/18/2023 0520   LYMPHSABS 1.0 04/17/2023 0552   MONOABS 0.8 04/17/2023 0552   EOSABS 0.0 04/17/2023 0552   BASOSABS 0.0 04/17/2023 0552    BMET    Component Value Date/Time   NA 136 04/18/2023 0520   K 3.9 04/18/2023 0520   CL 103 04/18/2023 0520   CO2 22 04/18/2023 0520   GLUCOSE 89 04/18/2023 0520   BUN 16 04/18/2023 0520   CREATININE 1.04 04/18/2023 0520   CALCIUM 8.6 (L) 04/18/2023 0520   GFRNONAA >60 04/18/2023 0520    IMAGING past 24 hours DG CHEST PORT 1 VIEW  Result Date: 04/18/2023 CLINICAL DATA:  Follow-up infiltrate.  Recent extubation. EXAM: PORTABLE CHEST 1 VIEW COMPARISON:  Radiographs 03/10/2022 and 04/16/2023. FINDINGS: 0545 hours. Interval extubation. The enteric tube has been removed. A feeding tube has been placed, projecting below the diaphragm, tip not visualized. There are persistent low lung volumes. The heart size  and mediastinal contours are stable with mild aortic tortuosity. Patchy left greater than right basilar opacities have improved, likely reflecting resolving atelectasis. No confluent airspace disease, pneumothorax or significant pleural effusion. No acute osseous findings are evident. Telemetry leads overlie the chest. IMPRESSION: Interval extubation and removal of the enteric tube with improved aeration of the lung bases. No acute cardiopulmonary process. Electronically Signed   By: Carey Bullocks M.D.   On: 04/18/2023 09:56    Vitals:   04/17/23 2359 04/18/23 0411 04/18/23 0813 04/18/23 1124  BP: (!) 166/84 (!) 162/102 (!) 156/80 (!) 150/68  Pulse: 66 82 60 89  Resp: 19 (!) 21 19 18   Temp: 98.1 F (36.7 C) 98.4 F (36.9 C) 98 F (36.7 C) 97.9 F (36.6 C)  TempSrc: Oral Oral Oral Oral  SpO2: 97% 98% 96% 98%  Weight:      Height:         PHYSICAL EXAM General: intubated CV: Regular rate and rhythm on monitor Respiratory:  synchronous with ventilator GI: Abdomen soft and nontender  NEURO:  Neuro - awake, alert, eyes open, global aphasia, nonverbal and not following verbal commands, able to pantomime, not able to repeat or name. Left gaze preference but able to have right gaze, blinking to visual threat on the left but not on the right. Right facial droop. Tongue protrusion not cooperative. LUE and LLE against gravity without difficulty, however, RUE increased tone but 0/5. RLE 3-/5,  able to against gravity but not able to hold without drift quickly to bed. Sensation, coordination and gait not tested.   ASSESSMENT/PLAN  Acute Ischemic Infarct:  left MCA scattered infarcts due to left M2 MCA occlusion s/p IR with TICI3 and left M2 stenting, etiology likely large vessel disease  Code Stroke CT head Acute L MCA territory infarct involving L insula. Small vessel disease. ASPECTS 9.   CTA head & neck Acute occlusion of a proximal left M2, left M1 moderate stenosis, right ICA bulb ulcerated  plaque, bilateral ICA bulb atherosclerosis. CT perfusion 45/91 S/p IR with TICI3 and left M2 stenting CT head s/p stent L MCA, Hyperdense material in the left Sylvian fissure, likely extravasated contrast. MRI  Multifocal acute ischemia within the left MCA territory. No hemorrhage or mass effect. 2D Echo EF 45-50%. Global hypokinesis of LV LDL 132 HgbA1c 5.2 VTE prophylaxis - lovenox 40 aspirin 81 mg daily prior to admission, now on aspirin 81 mg daily and Brilinta (ticagrelor) 90 mg bid for stent.  Therapy recommendations:  CIR Disposition:  pending  Hx of Stroke/TIA 01/2022 admitted for stroke. CT showed old left parietal occipital, right PCA and right MCA/ACA infarcts.  CTA head and neck left ICA terminus string sign and possible occlusion.  CTP positive penumbra.  Status post IR with TICI3. MRI showed left patchy MCA infarct. MRA showed left ICA terminus patent but left M1 moderate stenosis.  EF 35 to 40%, LDL 129, A1c 5.1.  Patient discharged on DAPT and Crestor 20. 30-day cardiac event monitoring as outpatient showed no active but only has 7 days of data.  Hypertension HFrEF Home meds:  coreg 3.125 twice daily, entresto 24-26 twice daily Stable On Coreg 3.125 Long-term BP goal normotensive  Hyperlipidemia Home meds:  none LDL 132, goal < 70 On crestor 20 Continue statin at discharge  Dysphagia Did not pass follow Currently NPO On tube bleed at 50  Other Stroke Risk Factors ETOH use, alcohol level <10, advised to drink no more than 2 drink(s) a day Congestive heart failure Former smoker  Other Active Problems Leukocytosis, mild, WBC 11.8--11.1 AKI, creatinine 1.22--1.04  Hospital day # 2  Marvel Plan, MD PhD Stroke Neurology 04/18/2023 5:28 PM   To contact Stroke Continuity provider, please refer to [email or web address]. After hours, contact General Neurology

## 2023-04-18 NOTE — Progress Notes (Signed)
Physical Therapy Treatment Patient Details Name: Casey Rangel MRN: [id number] DOB: [birth year] Today's Date: 04/18/2023   History of Present Illness Pt is a 72 y/o male presenting on 8/29 with L gaze, R sided weakness, and global aphasia. CT with concern for L MCA CVA, CT angiogram with occlusion of proximal L MCA M2. S/P thrombectomy 8/29. PMH L hip fx ; T9-11 fusion, L MCA CVA with residual aphasia, recent small volume SDH 01/2023., alzheimer's dementia.    PT Comments  Seems to be following commands a bit better, facilitating movement to EOB when cued but ultimately needs up to mod assist +2 to successfully complete bed mobility and transfers out of bed to chair. Stood at [facility] for approximately 5 min with bil support while being assisted with peri-care/hygiene due to bowel incontinence. Needs assist to advance RLE towards right but bears weight without over buckling today. Shows Rt neglect but does track past midline with verbal and visual cues; possible hemianopsia on Rt. Patient will continue to benefit from skilled physical therapy services to further improve independence with functional mobility.      If plan is discharge home, recommend the following: Two people to help with walking and/or transfers;Two people to help with bathing/dressing/bathroom;Assistance with cooking/housework;Direct supervision/assist for medications management;Assist for transportation;Help with stairs or ramp for entrance;Supervision due to cognitive status;Assistance with feeding   Can travel by private vehicle        Equipment Recommendations  Other (comment) (TBD, defer to next venue)    Recommendations for Other Services Rehab consult     Precautions / Restrictions Precautions Precautions: Fall Precaution Comments: aphasia Restrictions Weight Bearing Restrictions: No     Mobility  Bed Mobility Overal bed mobility: Needs Assistance Bed Mobility: Rolling, Sidelying to Sit Rolling: Mod assist, +2 for  physical assistance, +2 for safety/equipment Sidelying to sit: Mod assist, +2 for safety/equipment, +2 for physical assistance, HOB elevated       General bed mobility comments: Mod assist +2 to roll onto Lt side, multi modal cues to facilitate LEs off of bed, heavy trunk support to rise. Pt did demonstrate ability to bridge and scoot hips over in bed when initially cued to scoot to EOB.    Transfers Overall transfer level: Needs assistance Equipment used: 2 person hand held assist Transfers: Sit to/from Stand, Bed to chair/wheelchair/BSC Sit to Stand: +2 safety/equipment, +2 physical assistance, Mod assist   Step pivot transfers: Mod assist, +2 safety/equipment, +2 physical assistance       General transfer comment: Mod assist +2 for boost and balance to rise from bed. Once upright able to remain standing with min assist bil UE support. Pt with episode of bowel incontinence. Tolerated standing approx 5 min while being assisted with pericare. Mod assist +2 for step pivot transfer to recliner; needed physical assist to lift and advance RLE, minor instability in knee but bearing weight quite well. Used techniques to facilitate weight shift.    Ambulation/Gait                   Stairs             Wheelchair Mobility     Tilt Bed    Modified Rankin (Stroke Patients Only) Modified Rankin (Stroke Patients Only) Pre-Morbid Rankin Score: Slight disability Modified Rankin: Severe disability     Balance Overall balance assessment: Needs assistance Sitting-balance support: Single extremity supported, No upper extremity supported Sitting balance-Leahy Scale: Fair Sitting balance - Comments: statically preferece to L UE support  Standing balance support: Bilateral upper extremity supported, During functional activity Standing balance-Leahy Scale: Poor Standing balance comment: relies on BUE                            Cognition Arousal: Alert Behavior  During Therapy: Flat affect Overall Cognitive Status: Difficult to assess                                 General Comments: pt following some simple commands with increased time but decreased awareness, probelm sovling. Preference to L gaze, but able to scan when cued.        Exercises      General Comments General comments (skin integrity, edema, etc.): assisted with peri care, pt with bowel incontinence in bed.      Pertinent Vitals/Pain Pain Assessment Pain Assessment: CPOT Facial Expression: Relaxed, neutral Body Movements: Absence of movements Muscle Tension: Relaxed Compliance with ventilator (intubated pts.): N/A Vocalization (extubated pts.): N/A CPOT Total: 0 Pain Intervention(s): Monitored during session    Home Living                          Prior Function            PT Goals (current goals can now be found in the care plan section) Acute Rehab PT Goals Patient Stated Goal: none stated at this time PT Goal Formulation: Patient unable to participate in goal setting Time For Goal Achievement: 05/01/23 Potential to Achieve Goals: Fair Progress towards PT goals: Progressing toward goals    Frequency    Min 1X/week      PT Plan      Co-evaluation              AM-PAC PT "6 Clicks" Mobility   Outcome Measure  Help needed turning from your back to your side while in a flat bed without using bedrails?: Total Help needed moving from lying on your back to sitting on the side of a flat bed without using bedrails?: Total Help needed moving to and from a bed to a chair (including a wheelchair)?: Total Help needed standing up from a chair using your arms (e.g., wheelchair or bedside chair)?: Total Help needed to walk in hospital room?: Total Help needed climbing 3-5 steps with a railing? : Total 6 Click Score: 6    End of Session Equipment Utilized During Treatment: Gait belt Activity Tolerance: Patient tolerated treatment  well Patient left: in chair;with call bell/phone within reach;with chair alarm set;with SCD's reapplied Nurse Communication: Mobility status;Need for lift equipment (+2 assist, can use stedy or stand pivot transfer towards left) PT Visit Diagnosis: Unsteadiness on feet (R26.81);Other abnormalities of gait and mobility (R26.89);Muscle weakness (generalized) (M62.81);Difficulty in walking, not elsewhere classified (R26.2);Other symptoms and signs involving the nervous system (R29.898);Hemiplegia and hemiparesis Hemiplegia - Right/Left: Right Hemiplegia - caused by: Cerebral infarction     Time: 4742-5956 PT Time Calculation (min) (ACUTE ONLY): 22 min  Charges:    $Therapeutic Activity: 8-22 mins PT General Charges $$ ACUTE PT VISIT: 1 Visit                     Kathlyn Sacramento, PT, DPT [facility]  Rehabilitation Services Physical Therapist Office: [phone] Website: [email or web address]    Berton Mount 04/18/2023, 4:22 PM

## 2023-04-18 NOTE — Plan of Care (Signed)
  Problem: Ischemic Stroke/TIA Tissue Perfusion: Goal: Complications of ischemic stroke/TIA will be minimized Outcome: Progressing   

## 2023-04-19 ENCOUNTER — Inpatient Hospital Stay (HOSPITAL_COMMUNITY): Payer: Medicare Other

## 2023-04-19 ENCOUNTER — Encounter: Payer: Self-pay | Admitting: Family

## 2023-04-19 LAB — CBC
HCT: 43.7 % (ref 39.0–52.0)
Hemoglobin: 15.7 g/dL (ref 13.0–17.0)
MCH: 31.7 pg (ref 26.0–34.0)
MCHC: 35.9 g/dL (ref 30.0–36.0)
MCV: 88.3 fL (ref 80.0–100.0)
Platelets: 281 10*3/uL (ref 150–400)
RBC: 4.95 MIL/uL (ref 4.22–5.81)
RDW: 13.2 % (ref 11.5–15.5)
WBC: 10.8 10*3/uL — ABNORMAL HIGH (ref 4.0–10.5)
nRBC: 0 % (ref 0.0–0.2)

## 2023-04-19 LAB — MAGNESIUM
Magnesium: 2.3 mg/dL (ref 1.7–2.4)
Magnesium: 2.2 mg/dL (ref 1.7–2.4)

## 2023-04-19 LAB — GLUCOSE, CAPILLARY
Glucose-Capillary: 99 mg/dL (ref 70–99)
Glucose-Capillary: 87 mg/dL (ref 70–99)
Glucose-Capillary: 92 mg/dL (ref 70–99)
Glucose-Capillary: 103 mg/dL — ABNORMAL HIGH (ref 70–99)
Glucose-Capillary: 84 mg/dL (ref 70–99)

## 2023-04-19 LAB — BASIC METABOLIC PANEL
Anion gap: 14 (ref 5–15)
BUN: 28 mg/dL — ABNORMAL HIGH (ref 8–23)
CO2: 17 mmol/L — ABNORMAL LOW (ref 22–32)
Calcium: 8.6 mg/dL — ABNORMAL LOW (ref 8.9–10.3)
Chloride: 104 mmol/L (ref 98–111)
Creatinine, Ser: 0.99 mg/dL (ref 0.61–1.24)
GFR, Estimated: >60 mL/min (ref >60)
Glucose, Bld: 95 mg/dL (ref 70–99)
Potassium: 4.2 mmol/L (ref 3.5–5.1)
Sodium: 135 mmol/L (ref 135–145)

## 2023-04-19 LAB — PHOSPHORUS
Phosphorus: 3.4 mg/dL (ref 2.5–4.6)
Phosphorus: 3.4 mg/dL (ref 2.5–4.6)

## 2023-04-19 MED ORDER — SODIUM CHLORIDE 0.9 % IV SOLN: INTRAVENOUS | Status: DC

## 2023-04-19 NOTE — Plan of Care (Signed)
  Problem: Health Behavior/Discharge Planning: Goal: Ability to manage health-related needs will improve Outcome: Progressing   

## 2023-04-19 NOTE — Plan of Care (Signed)
  Problem: Coping: Goal: Will identify appropriate support needs Outcome: Progressing   Problem: Health Behavior/Discharge Planning: Goal: Goals will be collaboratively established with patient/family Outcome: Progressing   Problem: Nutrition: Goal: Risk of aspiration will decrease Outcome: Progressing   Problem: Clinical Measurements: Goal: Respiratory complications will improve Outcome: Progressing Goal: Cardiovascular complication will be avoided Outcome: Progressing   Problem: Elimination: Goal: Will not experience complications related to bowel motility Outcome: Progressing Goal: Will not experience complications related to urinary retention Outcome: Progressing

## 2023-04-19 NOTE — Progress Notes (Signed)
Pt pulled out his NG tube. It will be reinserted and an x-ray taken before restarting his feed.

## 2023-04-19 NOTE — Progress Notes (Signed)
Ng tube advanced 10 cm in and fixed at 65 cm, waiting for Abd X-ray

## 2023-04-19 NOTE — Progress Notes (Signed)
Patient pulled his NG out, MD aware, ordered not to re-insert, started on IVF NS

## 2023-04-19 NOTE — Progress Notes (Signed)
STROKE TEAM PROGRESS NOTE   BRIEF HPI Casey Rangel is a 72 y.o. 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 Underwent mechanic thrombectomy of L MCA M1 (TICI 3) complicated by contrast stain in posterior L putamen.    SIGNIFICANT HOSPITAL EVENTS 8/29 presented to ED, mechanic thrombectomy of L MCA M1 (TICI 3) requiring rescue left MCA angioplasty and stenting complicated by contrast stain L sylvian fissure 8/31 - pulled cortrak tube -> NG inserted 9/1 - pulled NG tube -> NG reinserted  INTERIM HISTORY/SUBJECTIVE RN at the bedside. Pt lethargic, had cortrak pulled yesterday, and then had NG pulled today, which was re-inserted, and now on TF.    OBJECTIVE  CBC    Component Value Date/Time   WBC 10.8 (H) 04/19/2023 0510   RBC 4.95 04/19/2023 0510   HGB 15.7 04/19/2023 0510   HCT 43.7 04/19/2023 0510   PLT 281 04/19/2023 0510   MCV 88.3 04/19/2023 0510   MCH 31.7 04/19/2023 0510   MCHC 35.9 04/19/2023 0510   RDW 13.2 04/19/2023 0510   LYMPHSABS 1.0 04/17/2023 0552   MONOABS 0.8 04/17/2023 0552   EOSABS 0.0 04/17/2023 0552   BASOSABS 0.0 04/17/2023 0552    BMET    Component Value Date/Time   NA 135 04/19/2023 0510   K 4.2 04/19/2023 0510   CL 104 04/19/2023 0510   CO2 17 (L) 04/19/2023 0510   GLUCOSE 95 04/19/2023 0510   BUN 28 (H) 04/19/2023 0510   CREATININE 0.99 04/19/2023 0510   CALCIUM 8.6 (L) 04/19/2023 0510   GFRNONAA >60 04/19/2023 0510    IMAGING past 24 hours DG Abd Portable 1V  Result Date: 04/19/2023 CLINICAL DATA:  Encounter for NG tube placement EXAM: PORTABLE ABDOMEN - 1 VIEW COMPARISON:  earlier today FINDINGS: Interval advancement of the enteric tube with tip and side port below the GE junction. The side port is approximately 4.3 cm distal to the GE junction.  Bowel gas pattern appears nonobstructed. IMPRESSION: Interval advancement of enteric tube with tip and side port below the GE junction. Electronically Signed   By: Signa Kell M.D.   On: 04/19/2023 11:50   DG CHEST PORT 1 VIEW  Result Date: 04/19/2023 CLINICAL DATA:  NGT placement EXAM: PORTABLE CHEST 1 VIEW COMPARISON:  04/18/2023. FINDINGS: The heart size and mediastinal contours are within normal limits. Both lungs are clear. Aorta is ectatic and calcified. The visualized skeletal structures are unremarkable. NG tube tip is below the diaphragm and off x-ray. Side port is in the distal esophagus. IMPRESSION: No acute cardiopulmonary process. NGT side port in the esophagus. NGT should be advanced about 5 cm. Electronically Signed   By: Layla Maw M.D.   On: 04/19/2023 09:36   DG Abd Portable 1V  Result Date: 04/19/2023 CLINICAL DATA:  [id number] Encounter for nasogastric (NG) tube placement [id number] EXAM: PORTABLE ABDOMEN - 1 VIEW COMPARISON:  04/18/2023 FINDINGS: Gastric tube has been partially retracted, distal tip at or just beyond the GE junction. Nonobstructive bowel gas pattern. Lower abdomen excluded. IMPRESSION: Gastric tube tip at or just beyond the GE junction. Electronically Signed   By: Corlis Leak M.D.   On: 04/19/2023 08:55   DG Abd Portable 1V  Result Date: 04/18/2023 CLINICAL DATA:  NG tube placement EXAM: PORTABLE ABDOMEN - 1 VIEW COMPARISON:  04/17/2023 FINDINGS:  removal of feeding tube. Placement of NG tube. NG tube is in the stomach approximately 5 cm beyond the GE junction. The side port is at the GE junction. IMPRESSION: NG tube in stomach with side port at GE junction. Electronically Signed   By: Genevive Bi M.D.   On: 04/18/2023 19:14    Vitals:   04/18/23 2341 04/19/23 0409 04/19/23 0759 04/19/23 1153  BP: 110/88 136/86 (!) 148/81 (!) 149/72  Pulse: 71 (!) 56 63 65  Resp:   18 18  Temp: 97.8 F (36.6 C) 98 F (36.7 C) 97.6 F (36.4 C) 97.9 F (36.6 C)  TempSrc:  Oral Oral Axillary Axillary  SpO2: 99% 98% 100% 99%  Weight:      Height:         PHYSICAL EXAM General: intubated CV: Regular rate and rhythm on monitor Respiratory:  synchronous with ventilator GI: Abdomen soft and nontender  NEURO:  Neuro - lethargic, eyes open on voice, global aphasia, nonverbal and not following verbal commands, able to pantomime, not able to repeat or name. Left gaze preference but able to have right gaze, blinking to visual threat on the left but not on the right. Right facial droop. Tongue protrusion not cooperative. LUE and LLE against gravity without difficulty, however, RUE increased tone but 0/5. RLE 3-/5, able to against gravity but not able to hold without drift quickly to bed. Sensation, coordination and gait not tested.   ASSESSMENT/PLAN  Acute Ischemic Infarct:  left MCA scattered infarcts due to left M2 MCA occlusion s/p IR with TICI3 and left M2 stenting, etiology likely large vessel disease  Code Stroke CT head Acute L MCA territory infarct involving L insula. Small vessel disease. ASPECTS 9.   CTA head & neck Acute occlusion of a proximal left M2, left M1 moderate stenosis, right ICA bulb ulcerated plaque, bilateral ICA bulb atherosclerosis. CT perfusion 45/91 S/p IR with TICI3 and left M2 stenting CT head s/p stent L MCA, Hyperdense material in the left Sylvian fissure, likely extravasated contrast. MRI  Multifocal acute ischemia within the left MCA territory. No hemorrhage or mass effect. 2D Echo EF 45-50%. Global hypokinesis of LV LDL 132 HgbA1c 5.2 VTE prophylaxis - lovenox 40 aspirin 81 mg daily prior to admission, now on aspirin 81 mg daily and Brilinta (ticagrelor) 90 mg bid for stent.  Therapy recommendations:  CIR Disposition:  pending  Hx of Stroke/TIA 01/2022 admitted for stroke. CT showed old left parietal occipital, right PCA and right MCA/ACA infarcts.  CTA head and neck left ICA terminus string sign and possible occlusion.  CTP  positive penumbra.  Status post IR with TICI3. MRI showed left patchy MCA infarct. MRA showed left ICA terminus patent but left M1 moderate stenosis.  EF 35 to 40%, LDL 129, A1c 5.1.  Patient discharged on DAPT and Crestor 20. 30-day cardiac event monitoring as outpatient showed no active but only has 7 days of data.  Hypertension HFrEF Home meds:  coreg 3.125 twice daily, entresto 24-26 twice daily Stable On Coreg 3.125 Long-term BP goal normotensive  Hyperlipidemia Home meds:  none LDL 132, goal < 70 On crestor 20 Continue statin at discharge  Dysphagia Did not pass follow Currently NPO On tube bleed at 50 Pulled off cortrak and NG -> NG re-inserted May need cortrak again tomorrow  Other Stroke Risk Factors ETOH use, alcohol level <10, advised to drink no more than 2 drink(s) a day Congestive heart failure Former smoker  Other Active Problems Leukocytosis,  mild, WBC 11.8--11.1--10.8 AKI, creatinine 1.22--1.04--0.99  Hospital day # 3  Marvel Plan, MD PhD Stroke Neurology 04/19/2023 2:08 PM   To contact Stroke Continuity provider, please refer to [email or web address]. After hours, contact General Neurology

## 2023-04-20 LAB — GLUCOSE, CAPILLARY
Glucose-Capillary: 109 mg/dL — ABNORMAL HIGH (ref 70–99)
Glucose-Capillary: 75 mg/dL (ref 70–99)
Glucose-Capillary: 76 mg/dL (ref 70–99)
Glucose-Capillary: 81 mg/dL (ref 70–99)
Glucose-Capillary: 88 mg/dL (ref 70–99)
Glucose-Capillary: 91 mg/dL (ref 70–99)

## 2023-04-20 LAB — BASIC METABOLIC PANEL
Anion gap: 9 (ref 5–15)
BUN: 27 mg/dL — ABNORMAL HIGH (ref 8–23)
CO2: 21 mmol/L — ABNORMAL LOW (ref 22–32)
Calcium: 8.4 mg/dL — ABNORMAL LOW (ref 8.9–10.3)
Chloride: 104 mmol/L (ref 98–111)
Creatinine, Ser: 1.05 mg/dL (ref 0.61–1.24)
GFR, Estimated: >60 mL/min (ref >60)
Glucose, Bld: 89 mg/dL (ref 70–99)
Potassium: 3.9 mmol/L (ref 3.5–5.1)
Sodium: 134 mmol/L — ABNORMAL LOW (ref 135–145)

## 2023-04-20 LAB — MAGNESIUM
Magnesium: 2.2 mg/dL (ref 1.7–2.4)
Magnesium: 2.1 mg/dL (ref 1.7–2.4)

## 2023-04-20 LAB — CBC
HCT: 43.4 % (ref 39.0–52.0)
Hemoglobin: 15.1 g/dL (ref 13.0–17.0)
MCH: 30.6 pg (ref 26.0–34.0)
MCHC: 34.8 g/dL (ref 30.0–36.0)
MCV: 88 fL (ref 80.0–100.0)
Platelets: 273 10*3/uL (ref 150–400)
RBC: 4.93 MIL/uL (ref 4.22–5.81)
RDW: 12.9 % (ref 11.5–15.5)
WBC: 9.8 10*3/uL (ref 4.0–10.5)
nRBC: 0 % (ref 0.0–0.2)

## 2023-04-20 LAB — PHOSPHORUS
Phosphorus: 3.3 mg/dL (ref 2.5–4.6)
Phosphorus: 3 mg/dL (ref 2.5–4.6)

## 2023-04-20 MED ORDER — ASPIRIN 300 MG RE SUPP
300.0000 mg | Freq: Once | RECTAL | Status: AC
  Administered 2023-04-20: 300 mg via RECTAL
  Filled 2023-04-20: qty 1

## 2023-04-20 MED ORDER — HALOPERIDOL LACTATE 5 MG/ML IJ SOLN
2.0000 mg | Freq: Three times a day (TID) | INTRAMUSCULAR | Status: DC | PRN
  Administered 2023-04-23 – 2023-05-01 (×3): 2 mg via INTRAVENOUS
  Filled 2023-04-20 (×3): qty 1

## 2023-04-20 NOTE — Progress Notes (Signed)
Physical Therapy Treatment Patient Details Name: Casey Rangel MRN: [id number] DOB: [birth year] Today's Date: 04/20/2023   History of Present Illness Pt is a 72 y/o male presenting on 8/29 with L gaze, R sided weakness, and global aphasia. CT with concern for L MCA CVA, CT angiogram with occlusion of proximal L MCA M2. S/P thrombectomy 8/29. PMH L hip fx ; T9-11 fusion, L MCA CVA with residual aphasia, recent small volume SDH 01/2023., alzheimer's dementia.    PT Comments  Making good progress towards functional goals. Min assist with bed mobility and transfers today. Pt is impulsive and needs multimodal cues for safety awareness and instructions for mobility. Min assist with +2 for chair follow to ambulate. Needs assist for RW  control, balance, safety awareness (Rt neglect), and to advance RLE into RW consistently. Gait mechanics break down with fatigue however pt unaware and automatically attempts to continue despite increased assistance needed. Patient will continue to benefit from skilled physical therapy services to further improve independence with functional mobility. Apparently, per notes, wife does not feel she will be able to provide assist for pt after d/c if he went to CIR. Would consider SNF, however still believe CIR would be a great option to quickly improve his independence given his PLOF, notable improvement since admission, and need for multidisciplinary care. Will continue to progress during admission.    If plan is discharge home, recommend the following: Two people to help with walking and/or transfers;Two people to help with bathing/dressing/bathroom;Assistance with cooking/housework;Direct supervision/assist for medications management;Assist for transportation;Help with stairs or ramp for entrance;Supervision due to cognitive status;Assistance with feeding   Can travel by private vehicle        Equipment Recommendations  Other (comment) (TBD, defer to next venue)     Recommendations for Other Services Rehab consult     Precautions / Restrictions Precautions Precautions: Fall Precaution Comments: aphasia Required Braces or Orthoses: Other Brace Other Brace: Wrist restraint L UE Restrictions Weight Bearing Restrictions: No     Mobility  Bed Mobility Overal bed mobility: Needs Assistance Bed Mobility: Sidelying to Sit, Rolling Rolling: Min assist Sidelying to sit: Min assist, HOB elevated       General bed mobility comments: Min assist to roll onto Lt side and facilitate LEs out of bed. Improved strength to push self into seated position at EOB with min assist for trunk support. HOB slightly elevated    Transfers Overall transfer level: Needs assistance Equipment used: Rolling walker (2 wheels) Transfers: Sit to/from Stand Sit to Stand: Min assist           General transfer comment: Min assist for boost to stand, slow to rise. Initially impulsive to attempt stand several times while assisting with set-up of RW and equipment, requires tactile cues to remain seated.    Ambulation/Gait Ambulation/Gait assistance: +2 safety/equipment, Min assist Gait Distance (Feet): 28 Feet Assistive device: Rolling walker (2 wheels) Gait Pattern/deviations: Step-to pattern, Decreased step length - right, Decreased stance time - right, Decreased stride length, Decreased dorsiflexion - right, Shuffle, Drifts right/left, Trunk flexed Gait velocity: decr Gait velocity interpretation: <1.31 ft/sec, indicative of household ambulator   General Gait Details: Educated on safe AD use with RW. Min assist for balance and RW control, tactile cues for RLE, nearly mod assist to help advance RLE fully into RW at times but does not buckle. Pt neglects/drags RLE unless cued frequently. +2 assist for chair follow. Impulsive. Mechanics breakdown further as he fatigues with poor awareness.   Stairs  Wheelchair Mobility     Tilt Bed    Modified  Rankin (Stroke Patients Only) Modified Rankin (Stroke Patients Only) Pre-Morbid Rankin Score: Slight disability Modified Rankin: Moderately severe disability     Balance Overall balance assessment: Needs assistance Sitting-balance support: Feet supported, No upper extremity supported Sitting balance-Leahy Scale: Fair Sitting balance - Comments: Sits at [facility] but impulsive   Standing balance support: Bilateral upper extremity supported, During functional activity, Reliant on assistive device for balance Standing balance-Leahy Scale: Poor Standing balance comment: relies on BUE                            Cognition Arousal: Alert Behavior During Therapy: Flat affect, Impulsive Overall Cognitive Status: No family/caregiver present to determine baseline cognitive functioning                                 General Comments: following 1 step command        Exercises General Exercises - Lower Extremity Ankle Circles/Pumps: AAROM, Both, 10 reps, Seated Quad Sets: AAROM, Strengthening, Both, 10 reps, Seated    General Comments General comments (skin integrity, edema, etc.): Assisted with yonker to clear oral secretions      Pertinent Vitals/Pain Pain Assessment Pain Assessment: PAINAD Breathing: normal Negative Vocalization: none Facial Expression: smiling or inexpressive Body Language: relaxed Consolability: no need to console PAINAD Score: 0 Pain Intervention(s): Monitored during session    Home Living     Available Help at Discharge: Family Type of Home: House                  Prior Function            PT Goals (current goals can now be found in the care plan section) Acute Rehab PT Goals Patient Stated Goal: none stated at this time PT Goal Formulation: Patient unable to participate in goal setting Time For Goal Achievement: 05/01/23 Potential to Achieve Goals: Fair Progress towards PT goals: Progressing toward goals     Frequency    Min 1X/week      PT Plan      Co-evaluation              AM-PAC PT "6 Clicks" Mobility   Outcome Measure  Help needed turning from your back to your side while in a flat bed without using bedrails?: A Little Help needed moving from lying on your back to sitting on the side of a flat bed without using bedrails?: A Little Help needed moving to and from a bed to a chair (including a wheelchair)?: A Little Help needed standing up from a chair using your arms (e.g., wheelchair or bedside chair)?: A Little Help needed to walk in hospital room?: A Lot Help needed climbing 3-5 steps with a railing? : Total 6 Click Score: 15    End of Session Equipment Utilized During Treatment: Gait belt Activity Tolerance: Patient tolerated treatment well Patient left: in chair;with call bell/phone within reach;with chair alarm set;with SCD's reapplied;Other (comment) (Lt wrist restraint) Nurse Communication: Mobility status PT Visit Diagnosis: Unsteadiness on feet (R26.81);Other abnormalities of gait and mobility (R26.89);Muscle weakness (generalized) (M62.81);Difficulty in walking, not elsewhere classified (R26.2);Other symptoms and signs involving the nervous system (R29.898);Hemiplegia and hemiparesis Hemiplegia - Right/Left: Right Hemiplegia - dominant/non-dominant: Dominant Hemiplegia - caused by: Cerebral infarction     Time: 1130-1150 PT Time Calculation (min) (ACUTE ONLY):  20 min  Charges:    $Gait Training: 8-22 mins PT General Charges $$ ACUTE PT VISIT: 1 Visit                     Kathlyn Sacramento, PT, DPT [facility]  Rehabilitation Services Physical Therapist Office: [phone] Website: [email or web address]    Berton Mount 04/20/2023, 12:22 PM

## 2023-04-20 NOTE — Progress Notes (Addendum)
STROKE TEAM PROGRESS NOTE   BRIEF HPI Casey Rangel is a 72 y.o. 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 Underwent mechanic thrombectomy of L MCA M1 (TICI 3) complicated by contrast stain in posterior L putamen.    SIGNIFICANT HOSPITAL EVENTS 8/29 presented to ED, mechanic thrombectomy of L MCA M1 (TICI 3) requiring rescue left MCA angioplasty and stenting complicated by contrast stain L sylvian fissure 8/31 - pulled cortrak tube -> NG inserted 9/1 - pulled NG tube -> NG reinserted  INTERIM HISTORY/SUBJECTIVE No family at the bedside. No new neurological events overnight.  Order placed for core trak tube to be placed tomorrow   OBJECTIVE  CBC    Component Value Date/Time   WBC 9.8 04/20/2023 0431   RBC 4.93 04/20/2023 0431   HGB 15.1 04/20/2023 0431   HCT 43.4 04/20/2023 0431   PLT 273 04/20/2023 0431   MCV 88.0 04/20/2023 0431   MCH 30.6 04/20/2023 0431   MCHC 34.8 04/20/2023 0431   RDW 12.9 04/20/2023 0431   LYMPHSABS 1.0 04/17/2023 0552   MONOABS 0.8 04/17/2023 0552   EOSABS 0.0 04/17/2023 0552   BASOSABS 0.0 04/17/2023 0552    BMET    Component Value Date/Time   NA 134 (L) 04/20/2023 0431   K 3.9 04/20/2023 0431   CL 104 04/20/2023 0431   CO2 21 (L) 04/20/2023 0431   GLUCOSE 89 04/20/2023 0431   BUN 27 (H) 04/20/2023 0431   CREATININE 1.05 04/20/2023 0431   CALCIUM 8.4 (L) 04/20/2023 0431   GFRNONAA >60 04/20/2023 0431    IMAGING past 24 hours No results found.  Vitals:   04/20/23 0013 04/20/23 0355 04/20/23 0748 04/20/23 1113  BP: (!) 153/89 (!) 156/73 (!) 160/85 136/85  Pulse:  63 73 76  Resp: 18 15 18 18   Temp: 98 F (36.7 C) 98.3 F (36.8 C) 97.9 F (36.6 C) 97.6 F (36.4 C)  TempSrc: Oral Oral Oral Oral  SpO2: 90% 98% 97% 99%  Weight:      Height:          PHYSICAL EXAM General: intubated CV: Regular rate and rhythm on monitor Respiratory:  synchronous with ventilator GI: Abdomen soft and nontender  NEURO:  Neuro -, eyes open, global aphasia, nonverbal and not following verbal commands, able to pantomime, not able to repeat or name. Left gaze preference but able to have right gaze, blinking to visual threat on the left but not on the right. Right facial droop. Tongue protrusion not cooperative. LUE and LLE against gravity without difficulty, however, RUE increased tone but 0/5. RLE 3-/5, able to against gravity but not able to hold without drift quickly to bed. Sensation, coordination and gait not tested.   ASSESSMENT/PLAN  Acute Ischemic Infarct:  left MCA scattered infarcts due to left M2 MCA occlusion s/p IR with TICI3 and left M2 stenting, etiology likely large vessel disease  Code Stroke CT head Acute L MCA territory infarct involving L insula. Small vessel disease. ASPECTS 9.   CTA head & neck Acute occlusion of a proximal left M2, left M1 moderate stenosis, right ICA bulb ulcerated plaque, bilateral ICA bulb atherosclerosis. CT perfusion 45/91 S/p IR with TICI3 and left M2 stenting CT head s/p stent L MCA, Hyperdense material in the left Sylvian fissure, likely extravasated contrast. MRI  Multifocal acute ischemia within the left MCA territory. No hemorrhage or mass effect. 2D Echo EF 45-50%. Global hypokinesis of LV LDL 132 HgbA1c 5.2 VTE prophylaxis - lovenox 40 aspirin 81 mg daily prior to admission, now on aspirin 81 mg daily and Brilinta (ticagrelor) 90 mg bid for stent, however, lost PO access, will do ASA PR 300 today.  Therapy recommendations:  CIR Disposition:  pending  Hx of Stroke/TIA 01/2022 admitted for stroke. CT showed old left parietal occipital, right PCA and right MCA/ACA infarcts.  CTA head and neck left ICA terminus string sign and possible occlusion.  CTP positive penumbra.  Status post IR with TICI3.  MRI showed left patchy MCA infarct. MRA showed left ICA terminus patent but left M1 moderate stenosis.  EF 35 to 40%, LDL 129, A1c 5.1.  Patient discharged on DAPT and Crestor 20. 30-day cardiac event monitoring as outpatient showed no active but only has 7 days of data.  Hypertension HFrEF Home meds:  coreg 3.125 twice daily, entresto 24-26 twice daily Stable On Coreg 3.125 Long-term BP goal normotensive  Hyperlipidemia Home meds:  none LDL 132, goal < 70 On crestor 20, however, lost PO access today, will resume once po access Continue statin at discharge  Dysphagia Did not pass follow Currently NPO Was on tube bleed at 50 Pulled off cortrak-> NG placed -> pulled off NG -> NG re-inserted->pulled off again cortrak order placed for tomorrow   Other Stroke Risk Factors ETOH use, alcohol level <10, advised to drink no more than 2 drink(s) a day Congestive heart failure Former smoker  Other Active Problems Leukocytosis, mild, WBC 11.8--11.1--10.8-9.8 AKI, creatinine 1.22--1.04--0.99-1.05  Hospital day # 4  Gevena Mart DNP, ACNPC-AG  [facility]  ATTENDING NOTE: I reviewed above note and agree with the assessment and plan. Pt was seen and examined.   No family at the bedside, pt lying in bed, awake, alert, still lethargic, eyes open, non verbal, but able to follow some simple commands with eye close/open, make fist, show two fingers, but did not open mouth or wiggle toes with me. Left gaze preference but able to cross to the right with voice, tracking bilaterally, right hemianopia. Right facial droop. Tongue protrusion not cooperative. RUE no movement. LUE at least 3/5. LLE against gravity without drift. RLE drift to bed around 5 sec. Sensation, coordination not cooperative and gait not tested.  Pt pulled off NG again yesterday, currently on IVF, no po access. Will give ASA PR today, will put on cortrak tomorrow for po nutrition and meds. Pending CIR. Leukocytosis  resolved and AKI much improved.   For detailed assessment and plan, please refer to above/below as I have made changes wherever appropriate.   Marvel Plan, MD PhD Stroke Neurology 04/20/2023 2:36 PM    To contact Stroke Continuity provider, please refer to [email or web address]. After hours, contact General Neurology

## 2023-04-20 NOTE — Plan of Care (Signed)
  Problem: Coping: Goal: Will identify appropriate support needs Outcome: Progressing   Problem: Nutrition: Goal: Risk of aspiration will decrease Outcome: Progressing   Problem: Clinical Measurements: Goal: Respiratory complications will improve Outcome: Progressing Goal: Cardiovascular complication will be avoided Outcome: Progressing

## 2023-04-20 NOTE — Progress Notes (Signed)
Nutrition Follow-up  DOCUMENTATION CODES:   Not applicable  INTERVENTION:  Once Cortrak is in place:  Start Osmolite 1.5 at 20ml and advance by 10ml q8h to a goal rate of 50 ml/h (1200 ml per day) Prosource TF20 60 ml once daily Free water flushes q4h   Provides 1880 kcal, 95 gm protein, 1784 ml total free water daily (TF + FWF)  NUTRITION DIAGNOSIS:   Inadequate oral intake related to inability to eat as evidenced by NPO status.  GOAL:   Patient will meet greater than or equal to 90% of their needs - Not met   MONITOR:   Diet advancement, Labs, Weight trends, TF tolerance  REASON FOR ASSESSMENT:   Consult Enteral/tube feeding initiation and management  ASSESSMENT:   Pt admitted with R sided weakness, L gaze deviation and global aphasia d/t acute L MCA stroke. 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  Meds reviewed. Labs reviewed: Na low, BUN elevated.   MD consult to place Cortrak tube. Spoke with MD who reports that the pt has had 3 tubes so far and has pulled all 3 out. MD would still like to proceed with Cortrak placement tomorrow. TF orders are already in place. RD will continue to monitor POC and TF tolerance once patient is able to receive TF.   NUTRITION - FOCUSED PHYSICAL EXAM:  Remote assessment.   Diet Order:   Diet Order             Diet NPO time specified  Diet effective now                   EDUCATION NEEDS:   No education needs have been identified at this time  Skin:  Skin Assessment: Reviewed RN Assessment  Last BM:  9/2 - type 5  Height:   Ht Readings from Last 1 Encounters:  04/16/23 5\' 10"  (1.778 m)    Weight:   Wt Readings from Last 1 Encounters:  04/16/23 76.4 kg    Ideal Body Weight:     BMI:  Body mass index is 24.17 kg/m.  Estimated Nutritional Needs:   Kcal:  1800-2000  Protein:   90-105g  Fluid:  >/=1.8L  Bethann Humble, RD, LDN, CNSC.

## 2023-04-20 NOTE — Evaluation (Addendum)
Speech Language Pathology Evaluation Patient Details Name: Casey Rangel MRN: [id number] DOB: 1950-09-25 Today's Date: 04/20/2023 Time: 4098-1191 SLP Time Calculation (min) (ACUTE ONLY): 21 min  Problem List:  Patient Active Problem List   Diagnosis Date Noted   Acute respiratory failure with hypoxia (HCC) 04/16/2023   Middle cerebral artery embolism, 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   Vascular dementia (HCC) 03/20/2022   Acute cognitive decline 03/11/2022   Mixed hyperlipidemia 03/11/2022   Constipation 03/11/2022   Acute combined systolic and diastolic heart failure (HCC)    Acute ischemic left MCA stroke (HCC) 01/15/2022   Primary hypertension 11/14/2020   Past Medical History:  Past Medical History:  Diagnosis Date   GERD (gastroesophageal reflux disease)    Hiatal hernia    Stroke [facility])    Past Surgical History:  Past Surgical History:  Procedure Laterality Date   INTRAMEDULLARY (IM) NAIL INTERTROCHANTERIC Left 10/17/2020   Procedure: INTRAMEDULLARY (IM) NAIL INTERTROCHANTRIC;  Surgeon: Kennedy Bucker, MD;  Location: [facility] ORS;  Service: Orthopedics;  Laterality: Left;   IR CT HEAD LTD  01/15/2022   IR CT HEAD LTD  04/16/2023   IR CT HEAD LTD  04/16/2023   IR INTRA CRAN STENT  04/16/2023   IR PERCUTANEOUS ART THROMBECTOMY/INFUSION INTRACRANIAL INC DIAG ANGIO  01/15/2022   IR PERCUTANEOUS ART THROMBECTOMY/INFUSION INTRACRANIAL INC DIAG ANGIO  04/16/2023   IR PTA INTRACRANIAL  01/15/2022   IR US GUIDE VASC ACCESS RIGHT  01/15/2022   RADIOLOGY WITH ANESTHESIA N/A 01/15/2022   Procedure: RADIOLOGY WITH ANESTHESIA;  Surgeon: Julieanne Cotton, MD;  Location: [facility] OR;  Service: Radiology;  Laterality: N/A;   RADIOLOGY WITH ANESTHESIA N/A 04/16/2023   Procedure: IR WITH ANESTHESIA;  Surgeon: Radiologist, Medication, MD;  Location: [facility] OR;  Service: Radiology;  Laterality: N/A;   T9-T11 fusion in 1990     HPI:  Casey Rangel is a 72 y.o. male with PMH significant for GERD, prior L MCA stroke with residual  mild to moderate aphasia, hard of hearing, recent BL small volume SDH in June 2024 who presents with right-sided weakness, left gaze deviation and global aphasia/mute on 04/16/23.     Patient was unable to provide any history secondary to aphasia.  Sister who lives with the patient reports that she  last saw him at his baseline at 5 PM.  On 04/16/23, she found him on the floor next to the bed.  He had soiled himself and he was not responding to her questions.  She called EMS who brought him as a code stroke. ST consulted for speech/language evaluation.   Assessment / Plan / Recommendation Clinical Impression  Pt seen for informal speech/language assessment via chart review, observations, staff report and utilization of portions of Western Aphasia Battery for naming tasks (pictures) with pt demonstrating global aphasia.  Mr. Henningsen was able to follow simple 1-step commands (YN:WGNF me 1 finger, look out window) with min visual cues provided by SLP. OME unable to be performed d/t potential oral apraxia/impact of global aphasia. Pt verbalized "no" with OT in previous session, but no true words noted during this assessment, although pt attempting to verbalize when asked simple questions such as "What is your name?"  Groping noted occasionally during verbalization attempts. Given F:4 pictures, pt able to identify items via pointing with 25% accuracy.  Pt with decreased ability to repeat words despite max verbal and visual cues paired with familiar phrase completion or phonemic cues.  Pt communicated primarily via gestures such as pointing or waving, but functional communication  impacted in general. ST will f/u to increase functional communication efforts and address global aphasia during acute stay.  Thank you for this consult.    SLP Assessment  SLP Recommendation/Assessment: Patient needs continued Speech Language Pathology Services SLP Visit Diagnosis: Aphasia (R47.01)    Recommendations for follow up  therapy are one component of a multi-disciplinary discharge planning process, led by the attending physician.  Recommendations may be updated based on patient status, additional functional criteria and insurance authorization.    Follow Up Recommendations  Follow physician's recommendations for discharge plan and follow up therapies    Assistance Recommended at Discharge  Frequent or constant Supervision/Assistance  Functional Status Assessment Patient has had a recent decline in their functional status and demonstrates the ability to make significant improvements in function in a reasonable and predictable amount of time.  Frequency and Duration min 2x/week  1 week      SLP Evaluation Cognition  Overall Cognitive Status: No family/caregiver present to determine baseline cognitive functioning Arousal/Alertness: Awake/alert Orientation Level: Other (comment) (DTA d/t global aphasia)       Comprehension  Auditory Comprehension Overall Auditory Comprehension: Impaired Yes/No Questions: Impaired Basic Biographical Questions: 0-25% accurate Commands: Impaired Two Step Basic Commands: 0-24% accurate Conversation: Other (comment) (pt nonverbal) Interfering Components: Other (comment) (global aphasia/HOH) EffectiveTechniques: Repetition;Increased volume Visual Recognition/Discrimination Discrimination: Not tested Reading Comprehension Reading Status: Unable to assess (comment) (global aphasia;pt did point to objects with 25% accuracy)    Expression Expression Primary Mode of Expression: Nonverbal - gestures Verbal Expression Overall Verbal Expression: Impaired Level of Generative/Spontaneous Verbalization:  (pt nonverbal) Repetition: Impaired Level of Impairment: Word level Naming: Impairment Responsive: 0-25% accurate Confrontation: Impaired Convergent: 0-24% accurate Other Naming Comments: groping, attempts to verbalize unsuccessful Pragmatics: Unable to assess Interfering  Components: Premorbid deficit Non-Verbal Means of Communication: Gestures Other Verbal Expression Comments: pointing inconsistent, nonverbal communication assists occasionally Written Expression Written Expression: Not tested   Oral / Motor  Oral Motor/Sensory Function Overall Oral Motor/Sensory Function: Other (comment) (unable to assess d/t pt participation/aphasia) Motor Speech Overall Motor Speech: Other (comment) (DTA) Respiration: Within functional limits Intelligibility: Not tested Motor Planning: Not tested Motor Speech Errors: Not applicable Interfering Components: Hearing loss            Pat Ellamae Lybeck,M.S.,CCC-SLP 04/20/2023, 11:57 AM

## 2023-04-20 NOTE — Progress Notes (Signed)
Occupational Therapy Treatment Patient Details Name: Casey Rangel MRN: [id number] DOB: 1951/06/25 Today's Date: 04/20/2023   History of present illness Pt is a 72 y/o male presenting on 8/29 with L gaze, R sided weakness, and global aphasia. CT with concern for L MCA CVA, CT angiogram with occlusion of proximal L MCA M2. S/P thrombectomy 8/29. PMH L hip fx ; T9-11 fusion, L MCA CVA with residual aphasia, recent small volume SDH 01/2023., alzheimer's dementia.   OT comments  Pt progressed this session one person (A) with bed mobility and basic transfer with RW. Pt verbalized once during session "No" in response to not being comfortable in the bed positioning. Pt with decrease attention to the R side and throughout session attempting to redirect pt to midline to prompt more R side awareness. Recommendation for skilled inpatient follow up therapy, <3 hours/day at this time per notes wife declined CIR currently. Therapy to attempt to clarify with wife if pt could be decreased (A) with transfers if she could consider CIR for return home.       If plan is discharge home, recommend the following:  Two people to help with walking and/or transfers;A lot of help with bathing/dressing/bathroom;Assistance with cooking/housework;Direct supervision/assist for medications management;Direct supervision/assist for financial management;Assist for transportation;Help with stairs or ramp for entrance   Equipment Recommendations  Other (comment)    Recommendations for Other Services Rehab consult    Precautions / Restrictions Precautions Precautions: Fall Precaution Comments: aphasia Required Braces or Orthoses: Other Brace Other Brace: mitten wrist restraint L UE Restrictions Weight Bearing Restrictions: No       Mobility Bed Mobility Overal bed mobility: Needs Assistance Bed Mobility: Rolling, Supine to Sit, Sit to Supine Rolling: Min assist   Supine to sit: Contact guard Sit to supine: Min  assist   General bed mobility comments: pt progressed to the eob on the L side with rail down. pt with help to left bil LE onto the bed surface but pt initiated.    Transfers Overall transfer level: Needs assistance Equipment used: Rolling walker (2 wheels) Transfers: Sit to/from Stand, Bed to chair/wheelchair/BSC Sit to Stand: Min assist Stand pivot transfers: Min assist         General transfer comment: pt needs hand over hand to initiate holding the RW with R UE and sustains.     Balance Overall balance assessment: Needs assistance Sitting-balance support: Single extremity supported, Feet supported Sitting balance-Leahy Scale: Fair     Standing balance support: Bilateral upper extremity supported, During functional activity, Reliant on assistive device for balance Standing balance-Leahy Scale: Poor                             ADL either performed or assessed with clinical judgement   ADL Overall ADL's : Needs assistance/impaired Eating/Feeding: NPO   Grooming: Moderate assistance;Bed level;Wash/dry face                                 General ADL Comments: pt demonstrates ability to stand pivot and use RW for basic transfer. pt could benefit from transfer to [facility] for attempts to void daily.    Extremity/Trunk Assessment Upper Extremity Assessment RUE Deficits / Details: R inattention to the R hand. pt with hand placed on RW and with standing initiates holding the RW.   Lower Extremity Assessment Lower Extremity Assessment: Defer to PT evaluation  Vision   Vision Assessment?: Vision impaired- to be further tested in functional context Additional Comments: L gaze preference. pt does track to midline this session to don socks   Perception     Praxis      Cognition Arousal: Alert Behavior During Therapy: Flat affect Overall Cognitive Status: History of cognitive impairments - at baseline                                  General Comments: following 1 step command, verbalized once "nO' appropriately. pt initiates washing his face        Exercises Exercises: Other exercises Other Exercises Other Exercises: AAROM for R UE - opening hand and elbow extension    Shoulder Instructions       General Comments can benefit from frequent transfers or position changes to decrease risk for skin break down    Pertinent Vitals/ Pain       Pain Assessment Pain Assessment: No/denies pain Facial Expression: Relaxed, neutral Pain Intervention(s): Monitored during session, Limited activity within patient's tolerance  Home Living     Available Help at Discharge: Family Type of Home: House                              Lives With: Family (sister)    Prior Functioning/Environment              Frequency  Min 1X/week        Progress Toward Goals  OT Goals(current goals can now be found in the care plan section)  Progress towards OT goals: Progressing toward goals  Acute Rehab OT Goals Patient Stated Goal: unable to communicate OT Goal Formulation: Patient unable to participate in goal setting Time For Goal Achievement: 05/01/23 Potential to Achieve Goals: Fair ADL Goals Pt Will Perform Grooming: with min assist;sitting Pt Will Perform Upper Body Bathing: with min assist;sitting Pt Will Perform Lower Body Dressing: with mod assist;sit to/from stand;sitting/lateral leans Pt Will Transfer to Toilet: with mod assist;ambulating Additional ADL Goal #1: Pt will be follow 1 step commands with 90% accuracy during ADLs. Additional ADL Goal #2: Pt will scan to R side with minimal cueing.  Plan      Co-evaluation                 AM-PAC OT "6 Clicks" Daily Activity     Outcome Measure   Help from another person eating meals?: Total Help from another person taking care of personal grooming?: A Lot Help from another person toileting, which includes using toliet, bedpan, or  urinal?: A Lot Help from another person bathing (including washing, rinsing, drying)?: A Lot Help from another person to put on and taking off regular upper body clothing?: A Lot Help from another person to put on and taking off regular lower body clothing?: Total 6 Click Score: 10    End of Session Equipment Utilized During Treatment: Gait belt;Rolling walker (2 wheels)  OT Visit Diagnosis: Unsteadiness on feet (R26.81);Muscle weakness (generalized) (M62.81) Symptoms and signs involving cognitive functions: Cerebral infarction Hemiplegia - Right/Left: Right Hemiplegia - caused by: Cerebral infarction   Activity Tolerance Patient tolerated treatment well   Patient Left in bed;with call bell/phone within reach;with bed alarm set;with restraints reapplied;with SCD's reapplied (Rn requesting sustain wrist restraint but reports mitten can be left off)   Nurse Communication Mobility status;Precautions  Time: 6578-4696 OT Time Calculation (min): 23 min  Charges: OT General Charges $OT Visit: 1 Visit OT Treatments $Self Care/Home Management : 8-22 mins   Brynn, OTR/L  Acute Rehabilitation Services Office: [phone] .   Mateo Flow 04/20/2023, 11:43 AM

## 2023-04-21 DIAGNOSIS — E440 Moderate protein-calorie malnutrition: Secondary | ICD-10-CM | POA: Insufficient documentation

## 2023-04-21 LAB — CBC
HCT: 40.6 % (ref 39.0–52.0)
Hemoglobin: 14.4 g/dL (ref 13.0–17.0)
MCH: 31.5 pg (ref 26.0–34.0)
MCHC: 35.5 g/dL (ref 30.0–36.0)
MCV: 88.8 fL (ref 80.0–100.0)
Platelets: 282 10*3/uL (ref 150–400)
RBC: 4.57 MIL/uL (ref 4.22–5.81)
RDW: 12.8 % (ref 11.5–15.5)
WBC: 7.6 10*3/uL (ref 4.0–10.5)
nRBC: 0 % (ref 0.0–0.2)

## 2023-04-21 LAB — GLUCOSE, CAPILLARY
Glucose-Capillary: 102 mg/dL — ABNORMAL HIGH (ref 70–99)
Glucose-Capillary: 103 mg/dL — ABNORMAL HIGH (ref 70–99)
Glucose-Capillary: 130 mg/dL — ABNORMAL HIGH (ref 70–99)
Glucose-Capillary: 69 mg/dL — ABNORMAL LOW (ref 70–99)
Glucose-Capillary: 79 mg/dL (ref 70–99)
Glucose-Capillary: 83 mg/dL (ref 70–99)
Glucose-Capillary: 87 mg/dL (ref 70–99)
Glucose-Capillary: 92 mg/dL (ref 70–99)

## 2023-04-21 LAB — BASIC METABOLIC PANEL
Anion gap: 11 (ref 5–15)
BUN: 22 mg/dL (ref 8–23)
CO2: 19 mmol/L — ABNORMAL LOW (ref 22–32)
Calcium: 8.3 mg/dL — ABNORMAL LOW (ref 8.9–10.3)
Chloride: 107 mmol/L (ref 98–111)
Creatinine, Ser: 0.81 mg/dL (ref 0.61–1.24)
GFR, Estimated: >60 mL/min (ref >60)
Glucose, Bld: 82 mg/dL (ref 70–99)
Potassium: 3.9 mmol/L (ref 3.5–5.1)
Sodium: 137 mmol/L (ref 135–145)

## 2023-04-21 LAB — PHOSPHORUS: Phosphorus: 2.9 mg/dL (ref 2.5–4.6)

## 2023-04-21 LAB — MAGNESIUM: Magnesium: 2.2 mg/dL (ref 1.7–2.4)

## 2023-04-21 MED ORDER — DONEPEZIL HCL 10 MG PO TABS
5.0000 mg | ORAL_TABLET | Freq: Every day | ORAL | Status: DC
  Administered 2023-04-21 – 2023-04-23 (×3): 5 mg via ORAL
  Filled 2023-04-21 (×3): qty 1

## 2023-04-21 MED ORDER — DEXTROSE 50 % IV SOLN
25.0000 mL | Freq: Once | INTRAVENOUS | Status: AC
  Administered 2023-04-21: 25 mL via INTRAVENOUS
  Filled 2023-04-21: qty 50

## 2023-04-21 MED ORDER — POTASSIUM CHLORIDE 20 MEQ PO PACK
20.0000 meq | PACK | Freq: Every day | ORAL | Status: DC
  Administered 2023-04-21 – 2023-04-23 (×3): 20 meq via ORAL
  Filled 2023-04-21 (×3): qty 1

## 2023-04-21 MED ORDER — CARVEDILOL 3.125 MG PO TABS
3.1250 mg | ORAL_TABLET | Freq: Two times a day (BID) | ORAL | Status: DC
  Administered 2023-04-22 – 2023-04-24 (×5): 3.125 mg via ORAL
  Filled 2023-04-21 (×5): qty 1

## 2023-04-21 MED ORDER — ROSUVASTATIN CALCIUM 20 MG PO TABS
20.0000 mg | ORAL_TABLET | Freq: Every day | ORAL | Status: DC
  Administered 2023-04-22 – 2023-04-24 (×3): 20 mg via ORAL
  Filled 2023-04-21 (×3): qty 1

## 2023-04-21 NOTE — Progress Notes (Signed)
BG 69 in NPO patient, patient unable to verbalize if symptomatic. Will give 1/2 amp d50 while awaiting core track  Ritta Slot, MD [facility] [phone]  If 7pm- 7am, please page neurology on call as listed in AMION.

## 2023-04-21 NOTE — Progress Notes (Addendum)
STROKE TEAM PROGRESS NOTE   BRIEF HPI Casey Rangel is a 72 y.o. 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 Underwent mechanic thrombectomy of L MCA M1 (TICI 3) complicated by contrast stain in posterior L putamen.    SIGNIFICANT HOSPITAL EVENTS 8/29 presented to ED, mechanic thrombectomy of L MCA M1 (TICI 3) requiring rescue left MCA angioplasty and stenting complicated by contrast stain L sylvian fissure 8/31 - pulled cortrak tube -> NG inserted 9/1 - pulled NG tube -> NG reinserted -> pulled 9/2: speech eval, able to take meds crushed with applesauce  INTERIM HISTORY/SUBJECTIVE No family at the bedside. No new neurological events overnight.  Holding off on coretrak today, as patient was able to self feed some and able to take meds. His dc plan is for SNF, so we would not be able to dc him with a coretrak there.    OBJECTIVE  CBC    Component Value Date/Time   WBC 7.6 04/21/2023 0745   RBC 4.57 04/21/2023 0745   HGB 14.4 04/21/2023 0745   HCT 40.6 04/21/2023 0745   PLT 282 04/21/2023 0745   MCV 88.8 04/21/2023 0745   MCH 31.5 04/21/2023 0745   MCHC 35.5 04/21/2023 0745   RDW 12.8 04/21/2023 0745   LYMPHSABS 1.0 04/17/2023 0552   MONOABS 0.8 04/17/2023 0552   EOSABS 0.0 04/17/2023 0552   BASOSABS 0.0 04/17/2023 0552    BMET    Component Value Date/Time   NA 137 04/21/2023 0745   K 3.9 04/21/2023 0745   CL 107 04/21/2023 0745   CO2 19 (L) 04/21/2023 0745   GLUCOSE 82 04/21/2023 0745   BUN 22 04/21/2023 0745   CREATININE 0.81 04/21/2023 0745   CALCIUM 8.3 (L) 04/21/2023 0745   GFRNONAA >60 04/21/2023 0745   IMAGING past 24 hours No results found.  Vitals:   04/20/23 1953 04/21/23 0006 04/21/23 0611 04/21/23 0808  BP: (!) 154/91 (!) 148/82  (!) 164/80  Pulse: 95 69  66   Resp: 18 18  (!) 22  Temp: 98.1 F (36.7 C) 98.2 F (36.8 C)  97.8 F (36.6 C)  TempSrc: Oral Oral  Oral  SpO2: 96% 100%  97%  Weight:   73.1 kg   Height:        PHYSICAL EXAM General: intubated CV: Regular rate and rhythm on monitor Respiratory:  synchronous with ventilator GI: Abdomen soft and nontender  NEURO:  Neuro -, eyes open, global aphasia, nonverbal and not following verbal commands, able to pantomime, not able to repeat or name. Left gaze preference but able to have right gaze, blinking to visual threat on the left but not on right. Right facial droop. Tongue protrusion not cooperative. LUE 4+/5 and RUE 4/5 with slight pronator drift. LLE 3/5 and RLE 3/5, but withdraw less to pain on the right than left. Sensation, coordination and gait not tested.   ASSESSMENT/PLAN  Acute Ischemic Infarct:  left MCA scattered infarcts due to left M2 MCA occlusion s/p IR with TICI3 and left M2 stenting, etiology likely large vessel disease  Code Stroke CT head Acute L MCA territory infarct involving L insula. Small vessel disease. ASPECTS 9.   CTA head & neck Acute occlusion of a proximal left M2, left M1 moderate stenosis, right ICA bulb ulcerated plaque, bilateral ICA bulb atherosclerosis.  CT perfusion 45/91 S/p IR with TICI3 and left M2 stenting CT head s/p stent L MCA, Hyperdense material in the left Sylvian fissure, likely extravasated contrast. MRI  Multifocal acute ischemia within the left MCA territory. No hemorrhage or mass effect. 2D Echo EF 45-50%. Global hypokinesis of LV LDL 132 HgbA1c 5.2 VTE prophylaxis - lovenox 40 aspirin 81 mg daily prior to admission, now on aspirin 81 mg daily and Brilinta (ticagrelor) 90 mg bid for stent. Was able to take these medications PO this AM.  Therapy recommendations:  SNF Disposition:  pending  Hx of Stroke/TIA 01/2022 admitted for stroke. CT showed old left parietal occipital, right PCA and right MCA/ACA infarcts.  CTA head and neck  left ICA terminus string sign and possible occlusion.  CTP positive penumbra.  Status post IR with TICI3. MRI showed left patchy MCA infarct. MRA showed left ICA terminus patent but left M1 moderate stenosis.  EF 35 to 40%, LDL 129, A1c 5.1.  Patient discharged on DAPT and Crestor 20. 30-day cardiac event monitoring as outpatient showed no active but only has 7 days of data.  Hypertension HFrEF Home meds:  coreg 3.125 twice daily, entresto 24-26 twice daily Stable On Coreg 3.125 Long-term BP goal normotensive  Hyperlipidemia Home meds:  none LDL 132, goal < 70 On crestor 20 Continue statin at discharge  Dysphagia Did not pass swallow Currently NPO Was on tube feed at 50 Pulled off cortrak-> NG placed -> pulled off NG -> NG re-inserted->pulled off again Holding off on coretrak today to see how much patient is able to do. Was able to take meds crushed with applesauce this am. DC planning is for SNF and he will not be able to have a coretrak there.   Other Stroke Risk Factors ETOH use, alcohol level <10, advised to drink no more than 2 drink(s) a day Congestive heart failure Former smoker  Other Active Problems Leukocytosis, improved: WBC 11.8--11.1--10.8-9.8 -> 7.6 AKI, creatinine 1.22--1.04--0.99-1.05 -> 0.81  Hospital day # 5   Pt seen by Neuro NP/APP and later by MD. Note/plan to be edited by MD as needed.    Lynnae January, DNP, AGACNP-BC [facility] Please use AMION for contact information & EPIC for messaging.  ATTENDING NOTE: I reviewed above note and agree with the assessment and plan. Pt was seen and examined.   No family at the bedside. Pt lying in bed, lethargic but open eyes, globe aphasia, did not following commands, but able to pantomime. Mild weakness on the right. His strength has improved on the right. Speech therapist on board, seems able to swallow and able to take medications with applesauce. Now on dys 1 and honey thick liquid. Hopefully no  need of cortrak in preparation of SNF placement.   For detailed assessment and plan, please refer to above/below as I have made changes wherever appropriate.   Marvel Plan, MD PhD Stroke Neurology 04/21/2023 12:39 PM    To contact Stroke Continuity provider, please refer to [email or web address]. After hours, contact General Neurology

## 2023-04-21 NOTE — NC FL2 (Signed)
[place] MEDICAID FL2 LEVEL OF CARE FORM     IDENTIFICATION  Patient Name: Casey Rangel Birthdate: 12/06/1950 Sex: male Admission Date (Current Location): 04/16/2023  [facility] and [place] Number:  Chiropodist and Address:  [facility] [place], [address], [place], [place] [address]      Provider Number: [id number]  Attending Physician Name and Address:  Stroke, Md, MD  Relative Name and Phone Number:       Current Level of Care: Hospital Recommended Level of Care: Skilled Nursing Facility Prior Approval Number:    Date Approved/Denied:   PASRR Number: [id number]  Discharge Plan: SNF    Current Diagnoses: Patient Active Problem List   Diagnosis Date Noted   Acute respiratory failure with hypoxia (HCC) 04/16/2023   Middle cerebral artery embolism, 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   Vascular dementia (HCC) 03/20/2022   Acute cognitive decline 03/11/2022   Mixed hyperlipidemia 03/11/2022   Constipation 03/11/2022   Acute combined systolic and diastolic heart failure (HCC)    Acute ischemic left MCA stroke (HCC) 01/15/2022   Primary hypertension 11/14/2020    Orientation RESPIRATION BLADDER Height & Weight      (disoriented)  Normal Incontinent Weight: 161 lb 2.5 oz (73.1 kg) Height:  5\' 10"  (177.8 cm)  BEHAVIORAL SYMPTOMS/MOOD NEUROLOGICAL BOWEL NUTRITION STATUS      Incontinent Diet (see DC summary)  AMBULATORY STATUS COMMUNICATION OF NEEDS Skin   Extensive Assist Verbally Skin abrasions                       Personal Care Assistance Level of Assistance  Bathing, Feeding, Dressing Bathing Assistance: Maximum assistance Feeding assistance: Maximum assistance Dressing Assistance: Maximum assistance     Functional Limitations Info  Speech     Speech Info: Impaired (expressive aphasia)    SPECIAL CARE FACTORS FREQUENCY  PT (By licensed PT), OT (By licensed OT), Speech therapy     PT Frequency: 5x/wk OT Frequency: 5x/wk     Speech  Therapy Frequency: 5x/wk      Contractures Contractures Info: Not present    Additional Factors Info  Code Status, Allergies, Psychotropic Code Status Info: DNR, pre-arrest interventions desired Allergies Info: Penicillins Psychotropic Info: Aricept 5mg  daily at bed         Current Medications (04/21/2023):  This is the current hospital active medication list Current Facility-Administered Medications  Medication Dose Route Frequency Provider Last Rate Last Admin   0.9 %  sodium chloride infusion   Intravenous Continuous Marvel Plan, MD 50 mL/hr at 04/21/23 0927 New Bag at 04/21/23 5621   acetaminophen (TYLENOL) tablet 650 mg  650 mg Oral Q4H PRN Erick Blinks, MD       Or   acetaminophen (TYLENOL) 160 MG/5ML solution 650 mg  650 mg Per Tube Q4H PRN Erick Blinks, MD       Or   acetaminophen (TYLENOL) suppository 650 mg  650 mg Rectal Q4H PRN Erick Blinks, MD       aspirin chewable tablet 81 mg  81 mg Oral Daily Deveshwar, Simonne Maffucci, MD   81 mg at 04/21/23 0849   Or   aspirin chewable tablet 81 mg  81 mg Per Tube Daily Deveshwar, Sanjeev, MD   81 mg at 04/19/23 1308   carvedilol (COREG) tablet 3.125 mg  3.125 mg Per Tube BID WC Earney Hamburg B, NP   3.125 mg at 04/21/23 0849   donepezil (ARICEPT) tablet 5 mg  5 mg Per Tube  QHS Earney Hamburg B, NP   5 mg at 04/18/23 2112   enoxaparin (LOVENOX) injection 40 mg  40 mg Subcutaneous Daily Erick Blinks, MD   40 mg at 04/21/23 0848   feeding supplement (OSMOLITE 1.5 CAL) liquid 1,000 mL  1,000 mL Per Tube Continuous Marvel Plan, MD   Stopped at 04/19/23 1553   feeding supplement (PROSource TF20) liquid 60 mL  60 mL Per Tube Daily Marvel Plan, MD   60 mL at 04/21/23 0848   free water 145 mL  145 mL Per Tube Q4H Marvel Plan, MD   145 mL at 04/19/23 1250   haloperidol lactate (HALDOL) injection 2 mg  2 mg Intravenous Q8H PRN Marvel Plan, MD       Oral care mouth rinse  15 mL Mouth Rinse 4 times per day Erick Blinks,  MD   15 mL at 04/21/23 0850   Oral care mouth rinse  15 mL Mouth Rinse PRN Erick Blinks, MD       potassium chloride (KLOR-CON) packet 20 mEq  20 mEq Oral Daily Lynnae January, NP       [START ON 04/22/2023] rosuvastatin (CRESTOR) tablet 20 mg  20 mg Oral Daily Hetty Blend C, NP       senna-docusate (Senokot-S) tablet 1 tablet  1 tablet Oral QHS PRN Erick Blinks, MD       sodium chloride flush (NS) 0.9 % injection 3 mL  3 mL Intravenous Once Kingsley, Victoria K, DO       ticagrelor (BRILINTA) tablet 90 mg  90 mg Oral BID Julieanne Cotton, MD   90 mg at 04/21/23 0848   Or   ticagrelor (BRILINTA) tablet 90 mg  90 mg Per Tube BID Julieanne Cotton, MD   90 mg at 04/19/23 1308     Discharge Medications: Please see discharge summary for a list of discharge medications.  Relevant Imaging Results:  Relevant Lab Results:   Additional Information SS#: [id number]  Baldemar Lenis, LCSW

## 2023-04-21 NOTE — Progress Notes (Signed)
Nutrition Follow-up  DOCUMENTATION CODES:   Non-severe (moderate) malnutrition in context of chronic illness  INTERVENTION:  Monitor for diet tolerance and advancement per SLP If Cortrak placed and TF initiated, recommend Start Osmolite 1.5 at 20ml and advance by 10ml q8h to a goal rate of 50 ml/h (1200 ml per day) Prosource TF20 60 ml once daily Free water flushes q4h Provides 1880 kcal, 95 gm protein, 1784 ml total free water daily (TF + FWF)  Will continue to follow and monitor for nutritional adequacy, GOC and whether pt would benefit from long term nutrition access.   NUTRITION DIAGNOSIS:   Moderate Malnutrition related to chronic illness (stroke, dementia, SDH) as evidenced by mild fat depletion, severe muscle depletion. - diagnosis updated 9/3  GOAL:   Patient will meet greater than or equal to 90% of their needs - goal unmet, addressing via diet advancement  MONITOR:   PO intake, Diet advancement, Labs, Weight trends, Skin  REASON FOR ASSESSMENT:   Consult Enteral/tube feeding initiation and management  ASSESSMENT:   Pt admitted with R sided weakness, L gaze deviation and global aphasia d/t acute L MCA stroke. 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  8/29 - s/p mechanical thrombectomy L MCA + stent placement 8/20 - Cortrak placed (tip gastric)  9/3 - s/p BSE by SLP, initiate dysphagia 1/honey thick liquids  Assessed pt at bedside. Pt tracked RD with eyes but unable to provide information. No family present at this time.   Per IDT discussion, plans to trial pt on dysphagia 1 diet with feeding assistance today and hold off on Cortrak placement as pt has pulled 3 prior tubes. Reassess for necessity of Cortrak placement tomorrow. Per discussion with RN, pt has consumed apple sauce with medications today. Suspect pt not likely to adequately meet nutrition  needs via PO intake alone and would continue to benefit from supplemental nutrition as aligns with GOC.  Admit weight: 76.4 kg Current weight: 73.1 kg  Medications: klor-con  Labs: CBG's 69-103 x24 hours  NUTRITION - FOCUSED PHYSICAL EXAM:  Flowsheet Row Most Recent Value  Orbital Region Moderate depletion  Upper Arm Region Mild depletion  Thoracic and Lumbar Region No depletion  Buccal Region Severe depletion  Temple Region Severe depletion  Clavicle Bone Region Severe depletion  Clavicle and Acromion Bone Region Moderate depletion  Scapular Bone Region Mild depletion  Dorsal Hand Unable to assess  [hand mits]  Patellar Region Severe depletion  Anterior Thigh Region Severe depletion  Posterior Calf Region Moderate depletion  Edema (RD Assessment) None  Hair Reviewed  Eyes Reviewed  Mouth Reviewed  Skin Reviewed  Nails Reviewed       Diet Order:   Diet Order             DIET - DYS 1 Fluid consistency: Honey Thick  Diet effective now                   EDUCATION NEEDS:   No education needs have been identified at this time  Skin:  Skin Assessment: Reviewed RN Assessment  Last BM:  9/2 (type 6 smear)  Height:   Ht Readings from Last 1 Encounters:  04/16/23 5\' 10"  (1.778 m)    Weight:   Wt Readings from Last 1 Encounters:  04/21/23 73.1 kg   BMI:  Body mass index is 23.12 kg/m.  Estimated Nutritional Needs:  Kcal:  1800-2000  Protein:  90-105g  Fluid:  >/=1.8L  Drusilla Kanner, RDN, LDN Clinical Nutrition

## 2023-04-21 NOTE — Evaluation (Signed)
Clinical/Bedside Swallow Evaluation Patient Details  Name: Casey Rangel MRN: [id number] Date of Birth: 09-02-1950  Today's Date: 04/21/2023 Time: SLP Start Time (ACUTE ONLY): 0845 SLP Stop Time (ACUTE ONLY): 0915 SLP Time Calculation (min) (ACUTE ONLY): 30 min  Past Medical History:  Past Medical History:  Diagnosis Date   GERD (gastroesophageal reflux disease)    Hiatal hernia    Stroke [facility])    Past Surgical History:  Past Surgical History:  Procedure Laterality Date   INTRAMEDULLARY (IM) NAIL INTERTROCHANTERIC Left 10/17/2020   Procedure: INTRAMEDULLARY (IM) NAIL INTERTROCHANTRIC;  Surgeon: Kennedy Bucker, MD;  Location: [facility] ORS;  Service: Orthopedics;  Laterality: Left;   IR CT HEAD LTD  01/15/2022   IR CT HEAD LTD  04/16/2023   IR CT HEAD LTD  04/16/2023   IR INTRA CRAN STENT  04/16/2023   IR PERCUTANEOUS ART THROMBECTOMY/INFUSION INTRACRANIAL INC DIAG ANGIO  01/15/2022   IR PERCUTANEOUS ART THROMBECTOMY/INFUSION INTRACRANIAL INC DIAG ANGIO  04/16/2023   IR PTA INTRACRANIAL  01/15/2022   IR US GUIDE VASC ACCESS RIGHT  01/15/2022   RADIOLOGY WITH ANESTHESIA N/A 01/15/2022   Procedure: RADIOLOGY WITH ANESTHESIA;  Surgeon: Julieanne Cotton, MD;  Location: [facility] OR;  Service: Radiology;  Laterality: N/A;   RADIOLOGY WITH ANESTHESIA N/A 04/16/2023   Procedure: IR WITH ANESTHESIA;  Surgeon: Radiologist, Medication, MD;  Location: [facility] OR;  Service: Radiology;  Laterality: N/A;   T9-T11 fusion in 1990     HPI:  Casey Rangel is a 72 y.o. male who presents with right-sided weakness, left gaze deviation and global aphasia/mute on 04/16/23. MRI shows Multifocal acute ischemia within the left MCA territory. No  hemorrhage or mass effect. 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  Recommended dys2/thin on MBS in 2023. Pt also with recent BL small volume SDH in June 2024.  who presents with right-sided weakness, left gaze deviation and global aphasia/mute on 04/16/23.    Assessment / Plan / Recommendation  Clinical Impression  Pt demonstrates significant oropharyngeal impairment that is a likely a worsened function from prior baseline dysphagia as documented in 2023 MBS. Pt has right labial weakness at rest and cannot follow oral motor commands with visual cues. He does laugh spontaneously and vocal quality was clear. There are no pooled secretions which is promising. Pt given a cup of applesauce and cues to use left hand for spoon and pt fed himself' with extra time and occasional assist; pt able to eat 2/3 of puree cup. Pt had slow initiation of oral manipulation, and late swallow initaition, sometimes swallowing every 2 bites. Suspect pt requires sensory feedback of bolus in the pharynx to initatie swallow. Pt also fed himself sips of honey thick liquids in a similar manner. SLP applied suction intermittently with no significant oral residue retrieved. Pt demonstrated no signs of aspiration though risk of aspiration is present. Would like to give pt some opportunity to practice swallowing and then procced with instrumental testing for best texture going forward. RN reports pt was able to consume meds crushed in puree after session. Will start a dys 1/honey thick diet and f/u for tolerance, readiness for MBS and potential for advancement. SLP Visit Diagnosis: Dysphagia, oropharyngeal phase (R13.12)    Aspiration Risk  Moderate aspiration risk;Risk for inadequate nutrition/hydration    Diet Recommendation  Dysphagia 1 (Puree);Honey-thick liquid    Liquid Administration via: Cup Medication Administration: Crushed with puree Supervision: Staff to assist with self feeding;Full supervision/cueing for compensatory strategies Compensations: Minimize environmental distractions Postural  Changes: Seated upright at 90 degrees;Remain upright for at least 30 minutes after po intake    Other  Recommendations Oral Care Recommendations: Oral care before and after PO    Recommendations for follow up therapy are one component of a multi-disciplinary discharge planning process, led by the attending physician.  Recommendations may be updated based on patient status, additional functional criteria and insurance authorization.  Follow up Recommendations Skilled nursing-short term rehab (<3 hours/day)      Assistance Recommended at Discharge    Functional Status Assessment Patient has had a recent decline in their functional status and demonstrates the ability to make significant improvements in function in a reasonable and predictable amount of time.  Frequency and Duration min 2x/week  2 weeks       Prognosis Prognosis for improved oropharyngeal function: Fair Barriers to Reach Goals: Severity of deficits;Time post onset      Swallow Study   General HPI: Casey Rangel is a 72 y.o. male who presents with right-sided weakness, left gaze deviation and global aphasia/mute on 04/16/23. MRI shows Multifocal acute ischemia within the left MCA territory. No  hemorrhage or mass effect. 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 Recommended dys2/thin on MBS in 2023. Pt also with recent BL small volume SDH in June 2024.  who presents with right-sided weakness, left gaze deviation and global aphasia/mute on 04/16/23. Type of Study: Bedside Swallow Evaluation Diet Prior to this Study: NPO Temperature Spikes Noted: No Respiratory Status: Room air History of Recent Intubation: No Behavior/Cognition: Alert;Cooperative;Requires cueing Oral Cavity Assessment: Within Functional  Limits Oral Care Completed by SLP: Yes Oral Cavity - Dentition: Adequate natural dentition Vision: Impaired for self-feeding Self-Feeding Abilities: Needs assist Patient Positioning: Upright in bed Baseline Vocal Quality: Normal (Pt cannot phoante on command, but laughs with good vocal quality) Volitional Cough: Cognitively unable to elicit Volitional Swallow: Unable to elicit    Oral/Motor/Sensory Function Overall Oral Motor/Sensory Function: Moderate impairment Facial ROM: Suspected CN VII (facial) dysfunction;Reduced right Facial Symmetry: Abnormal symmetry right;Suspected CN VII (facial) dysfunction Facial Strength: Reduced right;Suspected CN VII (facial) dysfunction Lingual ROM:  (Unable to assess, but has some function)   Ice Chips Ice chips: Impaired Presentation: Spoon Oral Phase Functional Implications: Prolonged oral transit Pharyngeal Phase Impairments: Suspected delayed Swallow   Thin Liquid Thin Liquid: Not tested    Nectar Thick Nectar Thick Liquid: Not tested   Honey Thick Honey Thick Liquid: Impaired Presentation: Cup Pharyngeal Phase Impairments: Suspected delayed Swallow   Puree Puree: Impaired Presentation: Spoon;Self Fed Oral Phase Impairments: Reduced lingual movement/coordination Oral Phase Functional Implications: Prolonged oral transit Pharyngeal Phase Impairments: Suspected delayed Swallow   Solid     Solid: Not tested      Davin Archuletta, Riley Nearing 04/21/2023,10:01 AM

## 2023-04-21 NOTE — Plan of Care (Signed)
Pt at times more redirectable.   4AM CBG 69, no obvious s/s of hypoglycemia. Pt alert. On call provider notified. 1/2 amp of D50 given, recheck 101.    Problem: Education: Goal: Knowledge of disease or condition will improve Outcome: Progressing Goal: Knowledge of secondary prevention will improve (MUST DOCUMENT ALL) Outcome: Progressing Goal: Knowledge of patient specific risk factors will improve Loraine Leriche N/A or DELETE if not current risk factor) Outcome: Progressing   Problem: Ischemic Stroke/TIA Tissue Perfusion: Goal: Complications of ischemic stroke/TIA will be minimized Outcome: Progressing

## 2023-04-22 LAB — GLUCOSE, CAPILLARY
Glucose-Capillary: 104 mg/dL — ABNORMAL HIGH (ref 70–99)
Glucose-Capillary: 117 mg/dL — ABNORMAL HIGH (ref 70–99)
Glucose-Capillary: 124 mg/dL — ABNORMAL HIGH (ref 70–99)
Glucose-Capillary: 145 mg/dL — ABNORMAL HIGH (ref 70–99)
Glucose-Capillary: 132 mg/dL — ABNORMAL HIGH (ref 70–99)

## 2023-04-22 NOTE — Plan of Care (Signed)
  Problem: Education: Goal: Knowledge of disease or condition will improve Outcome: Progressing Goal: Knowledge of secondary prevention will improve (MUST DOCUMENT ALL) Outcome: Progressing Goal: Knowledge of patient specific risk factors will improve Loraine Leriche N/A or DELETE if not current risk factor) Outcome: Progressing   Problem: Ischemic Stroke/TIA Tissue Perfusion: Goal: Complications of ischemic stroke/TIA will be minimized Outcome: Progressing   Problem: Coping: Goal: Will verbalize positive feelings about self Outcome: Progressing Goal: Will identify appropriate support needs Outcome: Progressing   Problem: Health Behavior/Discharge Planning: Goal: Ability to manage health-related needs will improve Outcome: Progressing Goal: Goals will be collaboratively established with patient/family Outcome: Progressing   Problem: Self-Care: Goal: Ability to participate in self-care as condition permits will improve Outcome: Progressing Goal: Verbalization of feelings and concerns over difficulty with self-care will improve Outcome: Progressing Goal: Ability to communicate needs accurately will improve Outcome: Progressing   Problem: Nutrition: Goal: Risk of aspiration will decrease Outcome: Progressing Goal: Dietary intake will improve Outcome: Progressing   Problem: Education: Goal: Understanding of CV disease, CV risk reduction, and recovery process will improve Outcome: Progressing Goal: Individualized Educational Video(s) Outcome: Progressing   Problem: Activity: Goal: Ability to return to baseline activity level will improve Outcome: Progressing   Problem: Cardiovascular: Goal: Ability to achieve and maintain adequate cardiovascular perfusion will improve Outcome: Progressing Goal: Vascular access site(s) Level 0-1 will be maintained Outcome: Progressing   Problem: Health Behavior/Discharge Planning: Goal: Ability to safely manage health-related needs after  discharge will improve Outcome: Progressing   Problem: Education: Goal: Knowledge of General Education information will improve Description: Including pain rating scale, medication(s)/side effects and non-pharmacologic comfort measures Outcome: Progressing   Problem: Health Behavior/Discharge Planning: Goal: Ability to manage health-related needs will improve Outcome: Progressing   Problem: Clinical Measurements: Goal: Ability to maintain clinical measurements within normal limits will improve Outcome: Progressing Goal: Will remain free from infection Outcome: Progressing Goal: Diagnostic test results will improve Outcome: Progressing Goal: Respiratory complications will improve Outcome: Progressing Goal: Cardiovascular complication will be avoided Outcome: Progressing   Problem: Activity: Goal: Risk for activity intolerance will decrease Outcome: Progressing   Problem: Nutrition: Goal: Adequate nutrition will be maintained Outcome: Progressing   Problem: Coping: Goal: Level of anxiety will decrease Outcome: Progressing   Problem: Elimination: Goal: Will not experience complications related to bowel motility Outcome: Progressing Goal: Will not experience complications related to urinary retention Outcome: Progressing   Problem: Pain Managment: Goal: General experience of comfort will improve Outcome: Progressing   Problem: Safety: Goal: Ability to remain free from injury will improve Outcome: Progressing   Problem: Skin Integrity: Goal: Risk for impaired skin integrity will decrease Outcome: Progressing   Problem: Safety: Goal: Non-violent Restraint(s) Outcome: Progressing

## 2023-04-22 NOTE — Progress Notes (Addendum)
Physical Therapy Treatment Patient Details Name: Casey Rangel MRN: [id number] DOB: April 02, 1951 Today's Date: 04/22/2023   History of Present Illness Pt is a 72 y/o male presenting on 8/29 with L gaze, R sided weakness, and global aphasia. CT with concern for L MCA CVA, CT angiogram with occlusion of proximal L MCA M2. S/P thrombectomy 8/29. PMH L hip fx ; T9-11 fusion, L MCA CVA with residual aphasia, recent small volume SDH 01/2023., alzheimer's dementia.    PT Comments  Received pt semi-reclined in bed with RN assisting with suctioning. Pt remains nonverbal but does follow simple one step commands inconsistently. Pt performed bed mobility with CGA overall with increased time/effort with cues for attention to R side. Pt was not impulsive this session; stood from EOB with RW and min A and ambulated ~30ft with min A +2 for equipment management with max verbal cues to increase R step length and assist to steer RW due to R inattention coming close to running into wall/door. Pt appeared fatigued but unable to express (attempting to hurry back to bed). NT arrived to check blood glucose and pt returned to supine and let out large breath. Pt left in care of NT. Pt would benefit from intensive therapy (3+ hours) if family is able to provide some assist upon discharge. Acute PT to cont to follow.    If plan is discharge home, recommend the following: Assistance with cooking/housework;Direct supervision/assist for medications management;Assist for transportation;Help with stairs or ramp for entrance;Supervision due to cognitive status;Assistance with feeding;A lot of help with walking and/or transfers;A lot of help with bathing/dressing/bathroom   Can travel by private vehicle        Equipment Recommendations  Other (comment) (TBD)    Recommendations for Other Services Rehab consult     Precautions / Restrictions Precautions Precautions: Fall Precaution Comments: aphasia Restrictions Weight Bearing  Restrictions: No     Mobility  Bed Mobility Overal bed mobility: Needs Assistance Bed Mobility: Rolling, Supine to Sit, Sit to Supine Rolling: Supervision   Supine to sit: Contact guard Sit to supine: Contact guard assist   General bed mobility comments: HOB elevated and use of bedrails with increased time/effort Patient Response: Flat affect, Cooperative  Transfers Overall transfer level: Needs assistance Equipment used: Rolling walker (2 wheels) Transfers: Sit to/from Stand Sit to Stand: Min assist           General transfer comment: stood from EOB with RW and min A. Pt did not appear impulsive this session and following one step commands appropriately.    Ambulation/Gait Ambulation/Gait assistance: +2 safety/equipment, Min assist Gait Distance (Feet): 30 Feet Assistive device: Rolling walker (2 wheels) Gait Pattern/deviations: Step-to pattern, Decreased step length - right, Decreased stance time - right, Decreased stride length, Decreased dorsiflexion - right, Shuffle, Drifts right/left, Trunk flexed, Decreased weight shift to right Gait velocity: decreased Gait velocity interpretation: <1.31 ft/sec, indicative of household ambulator   General Gait Details: pt required max veral cues to increased R step length and occasional assist to advance RLE. Pt also required assist to steer RW due to R inattention and tendency to get close to running into obstacles on R side.   Stairs             Wheelchair Mobility     Tilt Bed Tilt Bed Patient Response: Flat affect, Cooperative  Modified Rankin (Stroke Patients Only) Modified Rankin (Stroke Patients Only) Modified Rankin: Moderately severe disability     Balance Overall balance assessment: Needs assistance Sitting-balance support:  Feet supported, No upper extremity supported Sitting balance-Leahy Scale: Fair Sitting balance - Comments: sits EOB with close supervision   Standing balance support: Bilateral  upper extremity supported, During functional activity, Reliant on assistive device for balance (RW) Standing balance-Leahy Scale: Poor Standing balance comment: reliance on BUE support on RW. CGA for static standing balance and min A for dynamic standing balance                            Cognition Arousal: Alert Behavior During Therapy: Flat affect Overall Cognitive Status: No family/caregiver present to determine baseline cognitive functioning                                 General Comments: following 1 step commands consistantly        Exercises      General Comments        Pertinent Vitals/Pain Pain Assessment Pain Assessment: Faces Faces Pain Scale: No hurt    Home Living                          Prior Function            PT Goals (current goals can now be found in the care plan section) Acute Rehab PT Goals Patient Stated Goal: none stated at this time PT Goal Formulation: Patient unable to participate in goal setting Time For Goal Achievement: 05/01/23 Potential to Achieve Goals: Fair Progress towards PT goals: Progressing toward goals    Frequency    Min 1X/week      PT Plan      Co-evaluation              AM-PAC PT "6 Clicks" Mobility   Outcome Measure  Help needed turning from your back to your side while in a flat bed without using bedrails?: A Little Help needed moving from lying on your back to sitting on the side of a flat bed without using bedrails?: A Little Help needed moving to and from a bed to a chair (including a wheelchair)?: A Little Help needed standing up from a chair using your arms (e.g., wheelchair or bedside chair)?: A Little Help needed to walk in hospital room?: A Lot Help needed climbing 3-5 steps with a railing? : Total 6 Click Score: 15    End of Session Equipment Utilized During Treatment: Gait belt Activity Tolerance: Patient tolerated treatment well Patient left: in  bed;with call bell/phone within reach;with bed alarm set;with nursing/sitter in room Nurse Communication: Mobility status PT Visit Diagnosis: Unsteadiness on feet (R26.81);Other abnormalities of gait and mobility (R26.89);Muscle weakness (generalized) (M62.81);Difficulty in walking, not elsewhere classified (R26.2);Other symptoms and signs involving the nervous system (R29.898);Hemiplegia and hemiparesis Hemiplegia - Right/Left: Right Hemiplegia - dominant/non-dominant: Dominant Hemiplegia - caused by: Cerebral infarction     Time: 7829-5621 PT Time Calculation (min) (ACUTE ONLY): 14 min  Charges:    $Gait Training: 8-22 mins PT General Charges $$ ACUTE PT VISIT: 1 Visit                     Blima Rich PT, DPT Marlana Salvage Zaunegger 04/22/2023, 11:45 AM

## 2023-04-22 NOTE — Progress Notes (Signed)
Nutrition Brief Note  Per RD discussions with Neurology MD, hold on Cortrak placement given plans to discharge to SNF. 48 hour calorie count initiated today to assess nutritional adequacy and the necessity for ongoing supplemental nutrition support.   Per RN, pt is consuming dysphagia 1/pureed diet well with feeding assistance. Noted plans for MBS with SLP tomorrow.   Drusilla Kanner, RDN, LDN Clinical Nutrition

## 2023-04-22 NOTE — Plan of Care (Signed)
  Problem: Nutrition: Goal: Risk of aspiration will decrease Outcome: Not Progressing   

## 2023-04-22 NOTE — Progress Notes (Signed)
Pt was able to eat three meals today successfully, everything has been charted and meal tickets collected on door, pt swallowing improved throughout the day, dinner went quicker and no coughing was noted. Followed commands appropriately.   Balinda Quails, RN 04/22/2023 6:17 PM

## 2023-04-22 NOTE — Care Management Important Message (Signed)
Important Message  Patient Details  Name: Casey Rangel MRN: [id number] Date of Birth: Feb 15, 1951   Medicare Important Message Given:  Yes     Casey Rangel [name] 04/22/2023, 4:34 PM

## 2023-04-22 NOTE — Progress Notes (Addendum)
STROKE TEAM PROGRESS NOTE   BRIEF HPI Casey Rangel is a 72 y.o. 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 Underwent mechanic thrombectomy of L MCA M1 (TICI 3) complicated by contrast stain in posterior L putamen.   SIGNIFICANT HOSPITAL EVENTS 8/29: mechanic thrombectomy of L MCA M1 (TICI 3) requiring rescue left MCA angioplasty and stenting complicated by contrast stain L sylvian fissure 8/31 - pulled cortrak tube -> NG inserted 9/1 - pulled NG tube -> NG reinserted -> pulled 9/2: speech eval, able to take meds crushed with applesauce  INTERIM HISTORY/SUBJECTIVE No family at bedside.  Will hold off on coretrak given patient's ability to transition to dysphagia 1 diet and discharge planning to [facility].  Neuro exam seems stable, no acute events overnight.    OBJECTIVE  CBC    Component Value Date/Time   WBC 7.6 04/21/2023 0745   RBC 4.57 04/21/2023 0745   HGB 14.4 04/21/2023 0745   HCT 40.6 04/21/2023 0745   PLT 282 04/21/2023 0745   MCV 88.8 04/21/2023 0745   MCH 31.5 04/21/2023 0745   MCHC 35.5 04/21/2023 0745   RDW 12.8 04/21/2023 0745   LYMPHSABS 1.0 04/17/2023 0552   MONOABS 0.8 04/17/2023 0552   EOSABS 0.0 04/17/2023 0552   BASOSABS 0.0 04/17/2023 0552    BMET    Component Value Date/Time   NA 137 04/21/2023 0745   K 3.9 04/21/2023 0745   CL 107 04/21/2023 0745   CO2 19 (L) 04/21/2023 0745   GLUCOSE 82 04/21/2023 0745   BUN 22 04/21/2023 0745   CREATININE 0.81 04/21/2023 0745   CALCIUM 8.3 (L) 04/21/2023 0745   GFRNONAA >60 04/21/2023 0745   IMAGING past 24 hours No results found.  Vitals:   04/21/23 2355 04/22/23 0404 04/22/23 0633 04/22/23 0750  BP: (!) 150/63 (!) 148/78  (!) 138/93  Pulse: (!) 44 (!) 43  (!) 51  Resp: 18 18  17   Temp: 97.7 F (36.5 C) 97.8 F  (36.6 C)  (!) 97.5 F (36.4 C)  TempSrc: Oral Oral  Oral  SpO2: 100% 99%  100%  Weight:   73.4 kg   Height:        PHYSICAL EXAM General: intubated CV: Regular rate and rhythm on monitor Respiratory:  synchronous with ventilator GI: Abdomen soft and nontender  NEURO:  Neuro -, eyes open, global aphasia, nonverbal and not following verbal commands, not able to pantomime, not able to repeat or name. Left gaze preference but able to have right gaze, blinking to visual threat on the left but not on right. Right facial droop. Tongue protrusion not cooperative.  LUE 4+/5 and RUE 4/5 with slight pronator drift. LLE 3/5 and RLE 3/5, but withdraw less to pain on the right than left.  Sensation, coordination and gait not tested.   ASSESSMENT/PLAN  Acute Ischemic Infarct:  left MCA scattered infarcts due to left M2 MCA occlusion s/p IR with TICI3 and left M2 stenting, etiology likely large vessel disease  Code Stroke CT head Acute L MCA territory infarct involving L insula. Small vessel disease. ASPECTS 9.   CTA head & neck Acute occlusion of a proximal left M2, left M1 moderate stenosis, right ICA bulb ulcerated plaque, bilateral ICA bulb atherosclerosis. CT perfusion 45/91 S/p IR with TICI3 and left M2 stenting CT head  s/p stent L MCA, Hyperdense material in the left Sylvian fissure, likely extravasated contrast. MRI  Multifocal acute ischemia within the left MCA territory. No hemorrhage or mass effect. 2D Echo EF 45-50%. Global hypokinesis of LV LDL 132 HgbA1c 5.2 VTE prophylaxis - lovenox 40 aspirin 81 mg daily prior to admission, now on aspirin 81 mg daily and Brilinta (ticagrelor) 90 mg bid for stent.   Therapy recommendations:  SNF Disposition:  pending  Hx of Stroke/TIA 01/2022 admitted for stroke. CT showed old left parietal occipital, right PCA and right MCA/ACA infarcts.  CTA head and neck left ICA terminus string sign and possible occlusion.  CTP positive penumbra.  Status post  IR with TICI3. MRI showed left patchy MCA infarct. MRA showed left ICA terminus patent but left M1 moderate stenosis.  EF 35 to 40%, LDL 129, A1c 5.1.  Patient discharged on DAPT and Crestor 20. 30-day cardiac event monitoring as outpatient showed no active but only has 7 days of data.  Hypertension HFrEF Home meds:  coreg 3.125 twice daily, entresto 24-26 twice daily Stable On Coreg 3.125 Long-term BP goal normotensive  Hyperlipidemia Home meds:  none LDL 132, goal < 70 On crestor 20 Continue statin at discharge  Dysphagia Currently on dysphagia 1 diet with honey thick liquids Able to take medications with applesauce Pulled off cortrak-> NG placed -> pulled off NG -> NG re-inserted->pulled off again Holding off on coretrak as pt passed swallow  Dietitian on board for calore count, if not adequate, may still consider cortrak on Friday.   Other Stroke Risk Factors ETOH use, alcohol level <10, advised to drink no more than 2 drink(s) a day Congestive heart failure Former smoker  Other Active Problems Leukocytosis, improved: WBC 11.8--11.1--10.8-9.8 -> 7.6 AKI, creatinine 1.22--1.04--0.99-1.05 -> 0.81  Hospital day # 6   Pt seen by Neuro NP/APP and later by MD. Note/plan to be edited by MD as needed.    Lynnae January, DNP, AGACNP-BC [facility] Please use AMION for contact information & EPIC for messaging.   ATTENDING NOTE: I reviewed above note and agree with the assessment and plan. Pt was seen and examined.   No family at the bedside. Pt lying in bed, neuro unchanged. Ate lunch good yesterday but not too much at dinner. Dietitian on board for calore count. If not adequate, may still consider cortrak on Friday. Pending SNF placement. Continue DAPT with ASA and brilinta as well as statin.   For detailed assessment and plan, please refer to above/below as I have made changes wherever appropriate.   Marvel Plan, MD PhD Stroke Neurology 04/22/2023 6:25  PM     To contact Stroke Continuity provider, please refer to [email or web address]. After hours, contact General Neurology

## 2023-04-22 NOTE — Progress Notes (Addendum)
Speech Language Pathology Treatment: Dysphagia  Patient Details Name: Casey Rangel MRN: [id number] DOB: 1951/05/30 Today's Date: 04/22/2023 Time: 6440-3474 SLP Time Calculation (min) (ACUTE ONLY): 11 min  Assessment / Plan / Recommendation Clinical Impression  Pt demonstrates significant oropharyngeal impairment that is a likely a worsened function from prior baseline dysphagia as documented in 2023 MBS. Pt given a cup of applesauce and cues to use left hand for spoon and pt fed himself with extra time and occasional assist; pt able to eat 1/2 of puree cup. Pt had slow initiation of oral manipulation and late initiation of swallow initiation. Pt also fed himself sips of honey thick liquids in a similar manner. SLP applied suction intermittently with no significant oral residue retrieved. Pt demonstrated no signs of aspiration though risk of aspiration is present. Verbal cues seem limited in effectiveness in improving swallow efficiency. Per RN, pt tolerating a puree diet with honey-thick liquids without overt s/sx; however, pt needed assistance with feeding and extra time. Recommend continuation of a puree diet with honey-thick liquids with plan for MBSS tentatively scheduled next date to instrumentally assess pt's oropharyngeal swallow function. Though not targeted directly, pt continues to present with s/sx global aphasia. Not attempts at verbalization or vocalization this date. SLP to continue to f/u for dysphagia management and aphasia tx.   HPI HPI: Casey Rangel is a 72 y.o. male who presents with right-sided weakness, left gaze deviation and global aphasia/mute on 04/16/23. MRI shows Multifocal acute ischemia within the left MCA territory. No  hemorrhage or mass effect. 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   Pt with PMH significant for GERD, prior L MCA stroke with residual mild to moderate aphasia, hard of hearing, history of dysphagia in 2023 characterized by decreased oral cohesion,  delayed swallow initiation. Recommended dys2/thin on MBS in 2023. Pt also with recent BL small volume SDH in June 2024.  who presents with right-sided weakness, left gaze deviation and global aphasia/mute on 04/16/23.      SLP Plan  MBS;Continue with current plan of care (MBSS tentatively scheduled for 04/23/23)      Recommendations for follow up therapy are one component of a multi-disciplinary discharge planning process, led by the attending physician.  Recommendations may be updated based on patient status, additional functional criteria and insurance authorization.    Recommendations  Diet recommendations: Dysphagia 1 (puree);Honey-thick liquid Liquids provided via: Teaspoon;Cup Medication Administration: Crushed with puree Supervision: Staff to assist with self feeding (allow pt to self-feed as much as possible) Compensations: Minimize environmental distractions (check for pocketing) Postural Changes and/or Swallow Maneuvers: Seated upright 90 degrees;Upright 30-60 min after meal                  Oral care before and after PO   Frequent or constant Supervision/Assistance Dysphagia, oropharyngeal phase (R13.12)     MBS;Continue with current plan of care (MBSS tentatively scheduled for 04/23/23)    Clyde Canterbury, M.S., CCC-SLP Speech-Language Pathologist Secure Chat Preferred  O: [phone]  Woodroe Chen  04/22/2023, 1:26 PM

## 2023-04-23 ENCOUNTER — Inpatient Hospital Stay (HOSPITAL_COMMUNITY): Payer: Medicare Other

## 2023-04-23 ENCOUNTER — Encounter: Payer: Self-pay | Admitting: *Deleted

## 2023-04-23 ENCOUNTER — Telehealth: Payer: Self-pay | Admitting: *Deleted

## 2023-04-23 LAB — CBC
HCT: 45.1 % (ref 39.0–52.0)
Hemoglobin: 15.1 g/dL (ref 13.0–17.0)
MCH: 30.5 pg (ref 26.0–34.0)
MCHC: 33.5 g/dL (ref 30.0–36.0)
MCV: 91.1 fL (ref 80.0–100.0)
Platelets: 265 10*3/uL (ref 150–400)
RBC: 4.95 MIL/uL (ref 4.22–5.81)
RDW: 12.9 % (ref 11.5–15.5)
WBC: 10.6 10*3/uL — ABNORMAL HIGH (ref 4.0–10.5)
nRBC: 0 % (ref 0.0–0.2)

## 2023-04-23 LAB — GLUCOSE, CAPILLARY
Glucose-Capillary: 100 mg/dL — ABNORMAL HIGH (ref 70–99)
Glucose-Capillary: 120 mg/dL — ABNORMAL HIGH (ref 70–99)
Glucose-Capillary: 87 mg/dL (ref 70–99)
Glucose-Capillary: 87 mg/dL (ref 70–99)
Glucose-Capillary: 95 mg/dL (ref 70–99)
Glucose-Capillary: 95 mg/dL (ref 70–99)
Glucose-Capillary: 95 mg/dL (ref 70–99)

## 2023-04-23 LAB — BASIC METABOLIC PANEL
Anion gap: 10 (ref 5–15)
BUN: 11 mg/dL (ref 8–23)
CO2: 23 mmol/L (ref 22–32)
Calcium: 8.7 mg/dL — ABNORMAL LOW (ref 8.9–10.3)
Chloride: 104 mmol/L (ref 98–111)
Creatinine, Ser: 0.8 mg/dL (ref 0.61–1.24)
GFR, Estimated: >60 mL/min (ref >60)
Glucose, Bld: 113 mg/dL — ABNORMAL HIGH (ref 70–99)
Potassium: 3.5 mmol/L (ref 3.5–5.1)
Sodium: 137 mmol/L (ref 135–145)

## 2023-04-23 NOTE — Progress Notes (Signed)
Calorie Count Note- Day 1  48 hour calorie count ordered.  Diet: dysphagia 1, honey thick (09/04) Supplements: n/a  Estimated Nutritional Needs:  Kcal:  1800-2000 Protein:  90-105g Fluid:  >/=1.8L  Breakfast: 340 kcal and 27g protein Lunch: 153 kcal and 3g protein Dinner: 861 kcal and 34g protein Supplements: n/a  Total intake: 1354 kcal (75% of minimum estimated needs)  64 protein (71% of minimum estimated needs)  Nutrition Dx: Moderate Malnutrition related to chronic illness (stroke, dementia, SDH) as evidenced by mild fat depletion, severe muscle depletion.   Goal: Patient will meet greater than or equal to 90% of their needs   Intervention:  48 hour calorie count Monitor for diet tolerance and advancement per SLP; now on dysphagia 2/nectar thick liquids  Will continue to follow and monitor for nutritional adequacy, GOC and whether pt would benefit from long term nutrition access.  Add Mighty Shake TID with meals, each supplement provides 330 kcals and 9 grams of protein  Drusilla Kanner, RDN, LDN Clinical Nutrition

## 2023-04-23 NOTE — Progress Notes (Signed)
Occupational Therapy Treatment Patient Details Name: Casey Rangel MRN: [id number] DOB: [birth year] Today's Date: 04/23/2023   History of present illness Pt is a 72 y/o male presenting on 8/29 with L gaze, R sided weakness, and global aphasia. CT with concern for L MCA CVA, CT angiogram with occlusion of proximal L MCA M2. S/P thrombectomy 8/29. PMH L hip fx ; T9-11 fusion, L MCA CVA with residual aphasia, recent small volume SDH 01/2023., alzheimer's dementia.   OT comments  Patient attempting to get to EOB upon entry and required CGA to complete. Patient stood from EOB with min assist and appeared to need to use BSC. Patient instructed on transfer to [facility] with min assist and instructions on hand placement and safety requiring tactile and verbal cues. Patient required several stands from [facility] to perform toilet hygiene with patient often sitting down prematurely. Patient demonstrating gains with bed mobility, toilet transfers, and grooming. Patient will benefit from intensive inpatient follow up therapy, >3 hours/day to continue to address self care, transfers, and following directions. Acute OT to continue to follow.       If plan is discharge home, recommend the following:  A lot of help with bathing/dressing/bathroom;Assistance with cooking/housework;Direct supervision/assist for medications management;Direct supervision/assist for financial management;Assist for transportation;Help with stairs or ramp for entrance;A lot of help with walking and/or transfers   Equipment Recommendations  Other (comment) (defer)    Recommendations for Other Services Rehab consult    Precautions / Restrictions Precautions Precautions: Fall Precaution Comments: aphasia Other Brace: mittens restraints Restrictions Weight Bearing Restrictions: No       Mobility Bed Mobility Overal bed mobility: Needs Assistance Bed Mobility: Supine to Sit, Sit to Supine     Supine to sit: Contact guard Sit to supine:  Contact guard assist   General bed mobility comments: patient attempting to get to EOB upon entry and required cues and CGA to scoot to EOB    Transfers Overall transfer level: Needs assistance Equipment used: Rolling walker (2 wheels) Transfers: Sit to/from Stand, Bed to chair/wheelchair/BSC Sit to Stand: Min assist     Step pivot transfers: Min assist     General transfer comment: transfers to/from [facility] with cues for hand placement and safety.     Balance Overall balance assessment: Needs assistance Sitting-balance support: Feet supported, No upper extremity supported Sitting balance-Leahy Scale: Fair     Standing balance support: Bilateral upper extremity supported, During functional activity Standing balance-Leahy Scale: Poor Standing balance comment: reliant on BUE support for balance                           ADL either performed or assessed with clinical judgement   ADL Overall ADL's : Needs assistance/impaired     Grooming: Wash/dry hands;Wash/dry face;Minimal assistance;Sitting Grooming Details (indicate cue type and reason): assistance to initiate                 Toilet Transfer: Minimal assistance;BSC/3in1;Rolling walker (2 wheels) Toilet Transfer Details (indicate cue type and reason): cues for hand placement and walker management Toileting- Clothing Manipulation and Hygiene: Total assistance;Sit to/from stand Toileting - Clothing Manipulation Details (indicate cue type and reason): stood with RW for toilet hygiene       General ADL Comments: patient required multiple stands from [facility] to complete hygiene    Extremity/Trunk Assessment              Vision       Perception  Praxis      Cognition Arousal: Alert Behavior During Therapy: Flat affect Overall Cognitive Status: No family/caregiver present to determine baseline cognitive functioning                                 General Comments: following 1 step  commands with increased time        Exercises      Shoulder Instructions       General Comments      Pertinent Vitals/ Pain       Pain Assessment Pain Assessment: Faces Faces Pain Scale: No hurt Pain Intervention(s): Monitored during session  Home Living                                          Prior Functioning/Environment              Frequency  Min 1X/week        Progress Toward Goals  OT Goals(current goals can now be found in the care plan section)  Progress towards OT goals: Progressing toward goals  Acute Rehab OT Goals OT Goal Formulation: Patient unable to participate in goal setting Time For Goal Achievement: 05/01/23 Potential to Achieve Goals: Fair ADL Goals Pt Will Perform Grooming: with min assist;sitting Pt Will Perform Upper Body Bathing: with min assist;sitting Pt Will Perform Lower Body Dressing: with mod assist;sit to/from stand;sitting/lateral leans Pt Will Transfer to Toilet: with mod assist;ambulating Additional ADL Goal #1: Pt will be follow 1 step commands with 90% accuracy during ADLs. Additional ADL Goal #2: Pt will scan to R side with minimal cueing.  Plan      Co-evaluation                 AM-PAC OT "6 Clicks" Daily Activity     Outcome Measure   Help from another person eating meals?: Total Help from another person taking care of personal grooming?: A Lot Help from another person toileting, which includes using toliet, bedpan, or urinal?: A Lot Help from another person bathing (including washing, rinsing, drying)?: A Lot Help from another person to put on and taking off regular upper body clothing?: A Lot Help from another person to put on and taking off regular lower body clothing?: Total 6 Click Score: 10    End of Session Equipment Utilized During Treatment: Gait belt;Rolling walker (2 wheels)  OT Visit Diagnosis: Unsteadiness on feet (R26.81);Muscle weakness (generalized) (M62.81) Symptoms  and signs involving cognitive functions: Cerebral infarction Hemiplegia - Right/Left: Right Hemiplegia - caused by: Cerebral infarction   Activity Tolerance Patient tolerated treatment well   Patient Left in bed;with call bell/phone within reach;with bed alarm set   Nurse Communication Mobility status        Time: 6213-0865 OT Time Calculation (min): 31 min  Charges: OT General Charges $OT Visit: 1 Visit OT Treatments $Self Care/Home Management : 23-37 mins  Alfonse Flavors, OTA Acute Rehabilitation Services  Office [phone]   Dewain Penning 04/23/2023, 2:19 PM

## 2023-04-23 NOTE — Progress Notes (Addendum)
STROKE TEAM PROGRESS NOTE   BRIEF HPI Casey Rangel is a 72 y.o. 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 Underwent mechanic thrombectomy of L MCA M1 (TICI 3) complicated by contrast stain in posterior L putamen.   SIGNIFICANT HOSPITAL EVENTS 8/29: mechanic thrombectomy of L MCA M1 (TICI 3) requiring rescue left MCA angioplasty and stenting complicated by contrast stain L sylvian fissure 8/31 - pulled cortrak tube -> NG inserted 9/1 - pulled NG tube -> NG reinserted -> pulled 9/2: speech eval, able to take meds crushed with applesauce  INTERIM HISTORY/SUBJECTIVE No family at bedside.  Diet has been upgraded to Dysphagia 2 wih mildly thick liquids. Will continue to do calorie count to see how he does or if he will need long term access.  Neurologic exam is stable and unchanged  OBJECTIVE  CBC    Component Value Date/Time   WBC 10.6 (H) 04/23/2023 0928   RBC 4.95 04/23/2023 0928   HGB 15.1 04/23/2023 0928   HCT 45.1 04/23/2023 0928   PLT 265 04/23/2023 0928   MCV 91.1 04/23/2023 0928   MCH 30.5 04/23/2023 0928   MCHC 33.5 04/23/2023 0928   RDW 12.9 04/23/2023 0928   LYMPHSABS 1.0 04/17/2023 0552   MONOABS 0.8 04/17/2023 0552   EOSABS 0.0 04/17/2023 0552   BASOSABS 0.0 04/17/2023 0552    BMET    Component Value Date/Time   NA 137 04/21/2023 0745   K 3.9 04/21/2023 0745   CL 107 04/21/2023 0745   CO2 19 (L) 04/21/2023 0745   GLUCOSE 82 04/21/2023 0745   BUN 22 04/21/2023 0745   CREATININE 0.81 04/21/2023 0745   CALCIUM 8.3 (L) 04/21/2023 0745   GFRNONAA >60 04/21/2023 0745   IMAGING past 24 hours DG Swallowing Func-Speech Pathology  Result Date: 04/23/2023 Table formatting from the original result was not included. Modified Barium Swallow Study Patient Details Name: Casey Rangel MRN: [id number] Date of Birth: 08/22/1950 Today's Date: 04/23/2023 HPI/PMH: HPI: Casey Rangel is a 72 y.o. male who presents with right-sided weakness, left gaze deviation and global aphasia/mute on 04/16/23. MRI shows Multifocal acute ischemia within the left MCA territory. No  hemorrhage or mass effect. 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 Recommended dys2/thin on MBS in 2023. Pt also with recent BL small volume SDH in June 2024.  who presents with right-sided weakness, left gaze deviation and global aphasia/mute on 04/16/23. Clinical Impression: Clinical Impression: Pt presents with a primary oral dysphagia with right labial weakness with possible further oral deficits that are difficult to assess given pts global aphasia. Pt is attentive to PO and able to be fed with a spoon or self feed with a cup, though he needs assist to hold cup without spilling. There is mild anterior spillage on the right. Pt could not sip from a straw today. Pt had prolonged oral holding of liquids with slow passive release of hold, spillage to pharynx with subsequent trigger in the pyriforms. No aspiration with nectar or thin. However, oral holding was much longer with thin liquid. Pt seemed to have better sensation of nectar. Puree also tolerated well.  No significant oral residee. Attempted a ntrigrain bar and with extra time pt masticated and swallow this well. Will advance to nectar and dysphagia 2, though with practice suspect pt may even ultimately advance to dys 3/thin. Factors that may increase risk of adverse event in presence of aspiration Casey Rangel & Casey Rangel 2021): No data recorded Recommendations/Plan: Swallowing Evaluation Recommendations Swallowing Evaluation Recommendations Recommendations: PO diet PO  Diet Recommendation: Dysphagia 2 (Finely chopped); Mildly thick liquids (Level 2, nectar thick) Liquid Administration via: Cup; Spoon Medication Administration: Crushed with puree Supervision: Staff to assist with self-feeding Swallowing strategies  : Minimize environmental distractions; Slow rate; Small bites/sips; Check for pocketing or oral holding Postural changes: Position pt fully upright for meals Oral care recommendations: Oral care BID (2x/day) Treatment Plan Treatment Plan Treatment recommendations: Therapy as outlined in treatment plan below Follow-up recommendations: Skilled nursing-short term rehab (<3 hours/day) Functional status assessment: Patient has had a recent decline in their functional status and demonstrates the ability to make significant improvements in function in a reasonable and predictable amount of time. Treatment frequency: Min 2x/week Treatment duration: 2 weeks Interventions: Aspiration precaution training Recommendations Recommendations for follow up therapy are one component of a multi-disciplinary discharge planning process, led by the attending physician.  Recommendations may be updated based on patient status, additional functional criteria and insurance authorization. Assessment: Orofacial Exam: Orofacial Exam Oral Cavity: Oral Hygiene: WFL Oral Cavity - Dentition: Adequate natural dentition Anatomy: Anatomy: WFL Boluses Administered: Boluses Administered Boluses Administered: Thin liquids (Level 0); Mildly thick liquids (Level 2, nectar thick); Puree; Solid  Oral Impairment Domain: Oral Impairment Domain Lip Closure: Escape from interlabial space or lateral juncture, no extension beyond vermillion border Tongue control during bolus hold: Not tested Bolus preparation/mastication: Disorganized chewing/mashing with solid pieces of bolus unchewed Bolus transport/lingual motion: Delayed initiation of tongue motion (oral holding) Oral residue: Trace residue lining oral structures  Location of oral residue : Lateral sulci Initiation of pharyngeal swallow : Pyriform sinuses  Pharyngeal Impairment Domain: Pharyngeal Impairment Domain Soft palate elevation: No bolus between soft palate (SP)/pharyngeal wall (PW) Laryngeal elevation: Complete superior movement of thyroid cartilage with complete approximation of arytenoids to epiglottic petiole Anterior hyoid excursion: Complete anterior movement Epiglottic movement: Complete inversion Laryngeal vestibule closure: Complete, no air/contrast in laryngeal vestibule Pharyngeal stripping wave : Present - complete Pharyngoesophageal segment opening: Complete distension and complete duration, no obstruction of flow Tongue base retraction: No contrast between tongue base and posterior pharyngeal wall (PPW) Pharyngeal residue: Complete pharyngeal Casey  Esophageal Impairment Domain: No data recorded Pill: No data recorded Penetration/Aspiration Scale Score: Penetration/Aspiration Scale Score 1.  Material does not enter airway: Mildly thick liquids (Level 2, nectar thick); Thin liquids (Level 0); Puree; Solid Compensatory Strategies: No data recorded  General Information: Caregiver present: No  Diet Prior to this Study: Dysphagia 1 (pureed); Moderately thick liquids (Level 3, honey thick)   No data recorded  Respiratory Status: WFL   Supplemental O2: None (Room air)   History of Recent Intubation: No  Behavior/Cognition: Alert; Cooperative; Requires cueing Self-Feeding Abilities: Needs assist with self-feeding Baseline vocal quality/speech: Not observed Volitional Cough: Unable to elicit Volitional Swallow: Unable to elicit No data recorded Goal Planning: Prognosis for improved oropharyngeal function: Good Barriers to Reach Goals: Language deficits No data recorded Patient/Family Stated Goal: None stated; no family available Consulted and agree with results and recommendations: Pt unable/family or caregiver not available Pain: Pain Assessment Pain  Assessment: Faces Faces Pain Scale: 0 End of Session: Start Time:SLP Start Time (  ACUTE ONLY): 0830 Stop Time: SLP Stop Time (ACUTE ONLY): 0900 Time Calculation:SLP Time Calculation (min) (ACUTE ONLY): 30 min Charges: SLP Evaluations $ SLP Speech Visit: 1 Visit SLP Evaluations $MBS Swallow: 1 Procedure $Swallowing Treatment: 1 Procedure SLP visit diagnosis: SLP Visit Diagnosis: Dysphagia, oropharyngeal phase (R13.12) Past Medical History: Past Medical History: Diagnosis Date  GERD (gastroesophageal reflux disease)   Hiatal hernia   Stroke [facility])  Past Surgical History: Past Surgical History: Procedure Laterality Date  INTRAMEDULLARY (IM) NAIL INTERTROCHANTERIC Left 10/17/2020  Procedure: INTRAMEDULLARY (IM) NAIL INTERTROCHANTRIC;  Surgeon: Kennedy Bucker, MD;  Location: [facility] ORS;  Service: Orthopedics;  Laterality: Left;  IR CT HEAD LTD  01/15/2022  IR CT HEAD LTD  04/16/2023  IR CT HEAD LTD  04/16/2023  IR INTRA CRAN STENT  04/16/2023  IR PERCUTANEOUS ART THROMBECTOMY/INFUSION INTRACRANIAL INC DIAG ANGIO  01/15/2022  IR PERCUTANEOUS ART THROMBECTOMY/INFUSION INTRACRANIAL INC DIAG ANGIO  04/16/2023  IR PTA INTRACRANIAL  01/15/2022  IR US GUIDE VASC ACCESS RIGHT  01/15/2022  RADIOLOGY WITH ANESTHESIA N/A 01/15/2022  Procedure: RADIOLOGY WITH ANESTHESIA;  Surgeon: Julieanne Cotton, MD;  Location: [facility] OR;  Service: Radiology;  Laterality: N/A;  RADIOLOGY WITH ANESTHESIA N/A 04/16/2023  Procedure: IR WITH ANESTHESIA;  Surgeon: Radiologist, Medication, MD;  Location: [facility] OR;  Service: Radiology;  Laterality: N/A;  T9-T11 fusion in 1990   DeBlois, Riley Nearing 04/23/2023, 10:20 AM   Vitals:   04/23/23 0300 04/23/23 0343 04/23/23 0807 04/23/23 1149  BP:  (!) 142/83 126/75 (!) 154/96  Pulse:  (!) 56 (!) 54 67  Resp:   18 18  Temp: 98.7 F (37.1 C) 98.7 F (37.1 C) 97.7 F (36.5 C) 97.6 F (36.4 C)  TempSrc: Axillary Axillary Oral Oral  SpO2:  100% 99% 100%  Weight:      Height:        PHYSICAL EXAM General:  intubated CV: Regular rate and rhythm on monitor Respiratory:  synchronous with ventilator GI: Abdomen soft and nontender  NEURO:  Neuro -, eyes open, global aphasia, nonverbal and not following verbal commands, not able to pantomime, not able to repeat or name. Left gaze preference but able to have right gaze, blinking to visual threat on the left but not on right. Right facial droop. Tongue protrusion not cooperative.  LUE 4+/5 and RUE 4/5 with slight pronator drift. LLE 3/5 and RLE 3/5, but withdraw less to pain on the right than left.  Sensation, coordination and gait not tested.   ASSESSMENT/PLAN  Acute Ischemic Infarct:  left MCA scattered infarcts due to left M2 MCA occlusion s/p IR with TICI3 and left M2 stenting, etiology likely large vessel disease  Code Stroke CT head Acute L MCA territory infarct involving L insula. Small vessel disease. ASPECTS 9.   CTA head & neck Acute occlusion of a proximal left M2, left M1 moderate stenosis, right ICA bulb ulcerated plaque, bilateral ICA bulb atherosclerosis. CT perfusion 45/91 S/p IR with TICI3 and left M2 stenting CT head s/p stent L MCA, Hyperdense material in the left Sylvian fissure, likely extravasated contrast. MRI  Multifocal acute ischemia within the left MCA territory. No hemorrhage or mass effect. 2D Echo EF 45-50%. Global hypokinesis of LV LDL 132 HgbA1c 5.2 VTE prophylaxis - lovenox 40 aspirin 81 mg daily prior to admission, now on aspirin 81 mg daily and Brilinta (ticagrelor) 90 mg bid for stent.   Therapy recommendations:  SNF Disposition:  pending  Hx of Stroke/TIA 01/2022 admitted for stroke. CT showed old left  parietal occipital, right PCA and right MCA/ACA infarcts.  CTA head and neck left ICA terminus string sign and possible occlusion.  CTP positive penumbra.  Status post IR with TICI3. MRI showed left patchy MCA infarct. MRA showed left ICA terminus patent but left M1 moderate stenosis.  EF 35 to 40%, LDL 129, A1c  5.1.  Patient discharged on DAPT and Crestor 20. 30-day cardiac event monitoring as outpatient showed no active but only has 7 days of data.  Hypertension HFrEF Home meds:  coreg 3.125 twice daily, entresto 24-26 twice daily Stable On Coreg 3.125 Long-term BP goal normotensive  Hyperlipidemia Home meds:  none LDL 132, goal < 70 On crestor 20 Continue statin at discharge  Dysphagia Currently on dysphagia 1 diet with honey thick liquids Able to take medications with applesauce Pulled off cortrak-> NG placed -> pulled off NG -> NG re-inserted->pulled off again Holding off on coretrak as pt passed swallow  Dietitian on board for calore count, if not adequate, may still consider cortrak on Friday.   Other Stroke Risk Factors ETOH use, alcohol level <10, advised to drink no more than 2 drink(s) a day Congestive heart failure Former smoker  Other Active Problems Leukocytosis, improved: WBC 11.8--11.1--10.8-9.8 -> 7.6 AKI, creatinine 1.22--1.04--0.99-1.05 -> 0.81  Hospital day # 7   Pt seen by Neuro NP/APP and later by MD. Note/plan to be edited by MD as needed.    Gevena Mart DNP, ACNPC-AG  [facility]  I have personally obtained history,examined this patient, reviewed notes, independently viewed imaging studies, participated in medical decision making and plan of care.ROS completed by me personally and pertinent positives fully documented  I have made any additions or clarifications directly to the above note. Agree with note above.  Patient neurological exam remains unchanged with global aphasia and right hemiplegia.  He has been cleared for dysphagia diet and is currently on calorie count.  Continue ongoing therapies.  Transfer to rehabilitation and skilled nursing facility over the next few days when bed available.  Delia Heady, MD Medical Director [facility] Pager: [phone] 04/23/2023 4:15 PM     To contact Stroke Continuity provider,  please refer to [email or web address]. After hours, contact General Neurology

## 2023-04-23 NOTE — Plan of Care (Signed)
  Problem: Nutrition: Goal: Risk of aspiration will decrease Outcome: Not Progressing   

## 2023-04-23 NOTE — Plan of Care (Signed)
  Problem: Ischemic Stroke/TIA Tissue Perfusion: Goal: Complications of ischemic stroke/TIA will be minimized Outcome: Progressing   Problem: Self-Care: Goal: Ability to participate in self-care as condition permits will improve Outcome: Progressing   Problem: Nutrition: Goal: Risk of aspiration will decrease Outcome: Progressing   Problem: Cardiovascular: Goal: Vascular access site(s) Level 0-1 will be maintained Outcome: Progressing   Problem: Clinical Measurements: Goal: Diagnostic test results will improve Outcome: Progressing Goal: Cardiovascular complication will be avoided Outcome: Progressing   Problem: Activity: Goal: Risk for activity intolerance will decrease Outcome: Progressing   Problem: Nutrition: Goal: Adequate nutrition will be maintained Outcome: Progressing

## 2023-04-23 NOTE — Patient Outreach (Signed)
Care Coordination   04/23/2023 Name: Casey Rangel MRN: [id number] DOB: 07-Oct-1950   Care Coordination Outreach Attempts:  An unsuccessful telephone outreach was attempted today to offer the patient information about available care coordination services.  Follow Up Plan:  Additional outreach attempts will be made to offer the patient care coordination information and services.   Encounter Outcome:  No Answer   Care Coordination Interventions:  No, not indicated    Taqwa Deem, LCSW [place]  [facility], [facility] Licensed Clinical Social Worker Care Coordinator  Direct Dial: [phone]

## 2023-04-23 NOTE — Progress Notes (Signed)
Modified Barium Swallow Study  Patient Details  Name: Casey Rangel MRN: [id number] Date of Birth: 1951-01-27  Today's Date: 04/23/2023  Modified Barium Swallow completed.  Full report located under Chart Review in the Imaging Section.  History of Present Illness Casey Rangel is a 73 y.o. male who presents with right-sided weakness, left gaze deviation and global aphasia/mute on 04/16/23. MRI shows Multifocal acute ischemia within the left MCA territory. No  hemorrhage or mass effect. 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 Recommended dys2/thin on MBS in 2023. Pt also with recent BL small volume SDH in June 2024.  who presents with right-sided weakness, left gaze deviation and global aphasia/mute on 04/16/23.   Clinical Impression Pt presents with a primary oral dysphagia with right labial weakness with possible further oral deficits that are difficult to assess given pts global aphasia. Pt is attentive to PO and able to be fed with a spoon or self feed with a cup, though he needs assist to hold cup without spilling. There is mild anterior spillage on the right. Pt could not sip from a straw today. Pt had prolonged oral holding of liquids with slow passive release of hold, spillage to pharynx with subsequent trigger in the pyriforms. No aspiration with nectar or thin. However, oral holding was much longer with thin liquid. Pt seemed to have better sensation of nectar. Puree also tolerated well. No significant oral residee. Attempted a ntrigrain bar and with extra time pt masticated and swallow this well. Will advance to nectar and dysphagia 2, though with practice suspect pt may even ultimately advance to dys 3/thin. Factors that may increase risk of adverse event in presence of  aspiration Rubye Oaks & [name] 2021):    Swallow Evaluation Recommendations Recommendations: PO diet PO Diet Recommendation: Dysphagia 2 (Finely chopped);Mildly thick liquids (Level 2, nectar thick) Liquid Administration via: Cup;Spoon Medication Administration: Crushed with puree Supervision: Staff to assist with self-feeding Swallowing strategies  : Minimize environmental distractions;Slow rate;Small bites/sips;Check for pocketing or oral holding Postural changes: Position pt fully upright for meals Oral care recommendations: Oral care BID (2x/day)    Harlon Ditty, MA CCC-SLP  Acute Rehabilitation Services Secure Chat Preferred Office [phone]   Claudine Mouton 04/23/2023,10:19 AM

## 2023-04-24 ENCOUNTER — Inpatient Hospital Stay (HOSPITAL_COMMUNITY): Payer: Medicare Other

## 2023-04-24 LAB — BASIC METABOLIC PANEL
Anion gap: 9 (ref 5–15)
BUN: 9 mg/dL (ref 8–23)
CO2: 24 mmol/L (ref 22–32)
Calcium: 8.5 mg/dL — ABNORMAL LOW (ref 8.9–10.3)
Chloride: 105 mmol/L (ref 98–111)
Creatinine, Ser: 0.74 mg/dL (ref 0.61–1.24)
GFR, Estimated: >60 mL/min (ref >60)
Glucose, Bld: 103 mg/dL — ABNORMAL HIGH (ref 70–99)
Potassium: 3.2 mmol/L — ABNORMAL LOW (ref 3.5–5.1)
Sodium: 138 mmol/L (ref 135–145)

## 2023-04-24 LAB — CBC
HCT: 44.2 % (ref 39.0–52.0)
Hemoglobin: 15.4 g/dL (ref 13.0–17.0)
MCH: 31 pg (ref 26.0–34.0)
MCHC: 34.8 g/dL (ref 30.0–36.0)
MCV: 89.1 fL (ref 80.0–100.0)
Platelets: 306 10*3/uL (ref 150–400)
RBC: 4.96 MIL/uL (ref 4.22–5.81)
RDW: 12.9 % (ref 11.5–15.5)
WBC: 9.4 10*3/uL (ref 4.0–10.5)
nRBC: 0 % (ref 0.0–0.2)

## 2023-04-24 LAB — PHOSPHORUS
Phosphorus: 3.2 mg/dL (ref 2.5–4.6)
Phosphorus: 3.2 mg/dL (ref 2.5–4.6)

## 2023-04-24 LAB — GLUCOSE, CAPILLARY
Glucose-Capillary: 101 mg/dL — ABNORMAL HIGH (ref 70–99)
Glucose-Capillary: 72 mg/dL (ref 70–99)
Glucose-Capillary: 80 mg/dL (ref 70–99)
Glucose-Capillary: 110 mg/dL — ABNORMAL HIGH (ref 70–99)
Glucose-Capillary: 133 mg/dL — ABNORMAL HIGH (ref 70–99)

## 2023-04-24 LAB — MAGNESIUM
Magnesium: 2.1 mg/dL (ref 1.7–2.4)
Magnesium: 2.2 mg/dL (ref 1.7–2.4)

## 2023-04-24 MED ORDER — THIAMINE MONONITRATE 100 MG PO TABS
100.0000 mg | ORAL_TABLET | Freq: Every day | ORAL | Status: DC
  Administered 2023-04-24 – 2023-04-27 (×4): 100 mg
  Filled 2023-04-24 (×4): qty 1

## 2023-04-24 MED ORDER — OSMOLITE 1.5 CAL PO LIQD
1000.0000 mL | ORAL | Status: DC
  Administered 2023-04-24: 1000 mL

## 2023-04-24 MED ORDER — FREE WATER
145.0000 mL | Status: DC
  Administered 2023-04-24: 145 mL

## 2023-04-24 MED ORDER — CARVEDILOL 3.125 MG PO TABS
3.1250 mg | ORAL_TABLET | Freq: Two times a day (BID) | ORAL | Status: DC
  Administered 2023-04-24: 3.125 mg
  Filled 2023-04-24: qty 1

## 2023-04-24 MED ORDER — ROSUVASTATIN CALCIUM 20 MG PO TABS: 20.0000 mg | ORAL_TABLET | Freq: Every day | ORAL | Status: DC

## 2023-04-24 MED ORDER — PROSOURCE TF20 ENFIT COMPATIBL EN LIQD
60.0000 mL | Freq: Every day | ENTERAL | Status: DC
  Administered 2023-04-24 – 2023-04-25 (×2): 60 mL
  Filled 2023-04-24 (×2): qty 60

## 2023-04-24 MED ORDER — SENNOSIDES-DOCUSATE SODIUM 8.6-50 MG PO TABS: 1.0000 | ORAL_TABLET | Freq: Every evening | ORAL | Status: DC | PRN

## 2023-04-24 MED ORDER — POTASSIUM CHLORIDE 20 MEQ PO PACK
20.0000 meq | PACK | Freq: Every day | ORAL | Status: DC
  Administered 2023-04-24: 20 meq via ORAL
  Filled 2023-04-24: qty 1

## 2023-04-24 MED ORDER — DONEPEZIL HCL 10 MG PO TABS
5.0000 mg | ORAL_TABLET | Freq: Every day | ORAL | Status: DC
  Administered 2023-04-24: 5 mg
  Filled 2023-04-24: qty 1

## 2023-04-24 MED ORDER — POTASSIUM CHLORIDE 20 MEQ PO PACK: 20.0000 meq | PACK | Freq: Every day | ORAL | Status: DC

## 2023-04-24 MED ORDER — POTASSIUM CHLORIDE 20 MEQ PO PACK
40.0000 meq | PACK | ORAL | Status: AC
  Administered 2023-04-24 (×2): 40 meq via ORAL
  Filled 2023-04-24 (×2): qty 2

## 2023-04-24 NOTE — Progress Notes (Signed)
Patient in mitts with tele sitter but pulled core trac out of nose bridle removed by nurse. Patient appears much calmer now that it is out. Notified MD and left message for dietary.

## 2023-04-24 NOTE — Procedures (Signed)
Cortrak  Person Inserting Tube:  Pitts, Heather C, RD Tube Type:  Cortrak - 43 inches Tube Size:  10 Tube Location:  Left nare Secured by: Bridle Technique Used to Measure Tube Placement:  Marking at nare/corner of mouth Cortrak Secured At:  67 cm   Cortrak Tube Team Note:  Consult received to place a Cortrak feeding tube.   X-ray is required, abdominal x-ray has been ordered by the Cortrak team. Please confirm tube placement before using the Cortrak tube.   If the tube becomes dislodged please keep the tube and contact the Cortrak team at [email or web address] for replacement.  If after hours and replacement cannot be delayed, place a NG tube and confirm placement with an abdominal x-ray.    Heather P., RD, LDN, CNSC See AMiON for contact information    

## 2023-04-24 NOTE — Progress Notes (Signed)
Speech Language Pathology Treatment: Dysphagia  Patient Details Name: Casey Rangel MRN: [id number] DOB: [birth year] Today's Date: 04/24/2023 Time: 1000-1011 SLP Time Calculation (min) (ACUTE ONLY): 11 min  Assessment / Plan / Recommendation Clinical Impression  Hopeful to feed pt dys 2 breakfast to assess tolerance of advanced diet, but pt very lethargic this am. Eyes intermittently open. SLP repositioned pt, washed face, did oral care. Pt stayed quite sleepy. Offered a teaspoon of nectar thick juice, but pt barely responsive to it. Still he did swallow and oral suction provided to make sure mouth clear. Unable to attempt solids. Will f/u    HPI HPI: Casey Rangel is a 72 y.o. male who presents with right-sided weakness, left gaze deviation and global aphasia/mute on 04/16/23. MRI shows Multifocal acute ischemia within the left MCA territory. No  hemorrhage or mass effect. 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 Recommended dys2/thin on MBS in 2023. Pt also with recent BL small volume SDH in June 2024.  who presents with right-sided weakness, left gaze deviation and global aphasia/mute on 04/16/23.      SLP Plan  Continue with current plan of care      Recommendations for follow up therapy are one component of a multi-disciplinary discharge planning process, led by the attending physician.  Recommendations may be updated based on patient status, additional functional criteria and insurance authorization.    Recommendations  Diet recommendations: Dysphagia 2 (fine chop);Nectar-thick liquid Liquids provided via: Teaspoon;Cup Medication Administration: Crushed with puree Supervision: Staff to assist with self feeding Compensations: Minimize environmental distractions Postural  Changes and/or Swallow Maneuvers: Seated upright 90 degrees;Upright 30-60 min after meal                  Oral care before and after PO   Frequent or constant Supervision/Assistance Dysphagia, oropharyngeal phase (R13.12)     Continue with current plan of care     Mehmet Scally, Riley Nearing  04/24/2023, 10:13 AM

## 2023-04-24 NOTE — Progress Notes (Signed)
Nutrition Follow-up  DOCUMENTATION CODES:   Non-severe (moderate) malnutrition in context of chronic illness  INTERVENTION:  Initiate tube feeds via Cortrak: -Initiate Osmolite 1.5 at 20 mL/hour and advance by 10 mL/hour every 12 hours to goal rate of 50 mL/hour (1200 mL per day) -Provide PROSource TF20 60 mL once daily per tube -Provides: 1880 kcal, 95 grams of protein, 912 mL H2O daily  Provide free water flush of 145 mL every 4 hours, which provides a total of 1782 mL H2O daily including water in tube feeds.  Monitor magnesium, phosphorus, and magnesium BID for 3 days, MD to replace as needed, as pt is at risk for refeeding syndrome.    Provide thiamine 100 mg daily per tube x 5 days due to risk for refeeding syndrome.   Monitor for diet tolerance and advancement per SLP. Provide 1:1 assistance at meals.  Continue Mighty Shake po TID with meals, each supplement provides 330 kcal and 9 grams of protein.  Plan per team is to start discussions regarding more permanent enteral access (G-tube).  NUTRITION DIAGNOSIS:   Moderate Malnutrition related to chronic illness (stroke, dementia, SDH) as evidenced by mild fat depletion, severe muscle depletion.  Ongoing - addressing with interventions.  GOAL:   Patient will meet greater than or equal to 90% of their needs  Not met - addressing with placement of Cortrak tube for initiation of tube feeds.  MONITOR:   PO intake, Diet advancement, Labs, Weight trends, Skin  REASON FOR ASSESSMENT:   Consult Enteral/tube feeding initiation and management  ASSESSMENT:   Pt admitted with R sided weakness, L gaze deviation and global aphasia d/t acute L MCA stroke. 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  8/29 - s/p mechanical thrombectomy L MCA + stent placement 8/30 - Cortrak placed (tip gastric)  8/31 - Cortrak tube removed, NG  tube inserted 9/1 - pulled NG tube, NG reinserted and then pulled 9/3 - s/p BSE by SLP, initiate dysphagia 1/honey thick liquids  9/4 - 48 hour calorie count ordered 9/5 - s/p MBS by SLP, diet advanced to dysphagia 2 with nectar thick liquids  Assessed pt at bedside this AM to complete calorie count. Pt nonverbal. Pt was lethargic this AM. RN had not been able to wake him up to eat breakfast or take medications yet. Per calorie count receipts PO intake significantly decreased yesterday. Pt only took bites of lunch and no documented intake at dinner. When RD later returned to assess how pt had done with breakfast he had only taken bites of meal but had the nectar thick cranberry juice. Discussed with team via secure chat. Plan is for placement of Cortrak tube today for initiation of enteral nutrition. Plan will also be to start discussions regarding more permanent enteral access (G-tube).  Admission wt 76.4 kg on 8/29, but unsure of accuracy as pt was 73.1 kg on 9/3. May be related to use of different scales. Current wt 72.9 kg. Recommend continuing to monitor trends.  Enteral Access: 10 Fr. Cortrak tube placed 9/6 in left nare; terminates in gastric pylorus vs proximal duodenal bulb per abdominal x-ray 9/6  Medications reviewed and include: potassium chloride packet 20 mEq daily po, potassium chloride 40 mEq every 4 hours x 2 today, NS at 50 mL/hour  Labs reviewed: CBG 72-120, Potassium 3.2. No magnesium or phosphorus level this AM, but would be beneficial to have baseline  prior to starting tube feeds due to risk for refeeding syndrome. Added on magnesium and phosphorus levels and both were WNL.  UOP: 1450 mL (0.8 mL/kg/hr) in previous 24 hrs  I/O: -1607.1 mL since admission  Diet Order:   Diet Order             DIET DYS 2 Fluid consistency: Nectar Thick  Diet effective now                  EDUCATION NEEDS:   No education needs have been identified at this time  Skin:  Skin  Assessment: Reviewed RN Assessment  Last BM:  04/23/23 - medium type 5  Height:   Ht Readings from Last 1 Encounters:  04/16/23 5\' 10"  (1.778 m)   Weight:   Wt Readings from Last 1 Encounters:  04/24/23 72.9 kg   BMI:  Body mass index is 23.06 kg/m.  Estimated Nutritional Needs:   Kcal:  1800-2000  Protein:  90-105g  Fluid:  >/=1.8L  Letta Median, MS, RD, LDN, CNSC Pager number available on Amion

## 2023-04-24 NOTE — TOC Progression Note (Signed)
Transition of Care [facility]) - Progression Note    Patient Details  Name: Romere Gardy MRN: [id number] Date of Birth: February 12, 1951  [facility]) CM/SW Contact  Baldemar Lenis, [place] Phone Number: 04/24/2023, 3:09 PM  Clinical Narrative:   CSW spoke with sister, [name], to provide update on SNF offers. [name] would like patient to return to [place], as he had been there before and that is closest to home. Patient has an outstanding balance of $876 at [facility] that would need to be settled before [facility] could readmit, and [name] indicated that they do not have the money to pay for that. [name] is trying to get the patient setup with Medicaid to help with all of his medical bills. Patient's other offer is [facility], and [name] would like to accept. Noting that patient had cortrak placed today, has telesitter, not ready for discharge at this time. CSW to follow.    Expected Discharge Plan: Skilled Nursing Facility Barriers to Discharge: Continued Medical Work up  Expected Discharge Plan and Services In-house Referral: Clinical Social Work     Living arrangements for the past 2 months: Mobile Home                                       Social Determinants of Health (SDOH) Interventions SDOH Screenings   Food Insecurity: No Food Insecurity (04/02/2023)  Housing: Medium Risk (03/21/2022)  Transportation Needs: No Transportation Needs (04/02/2023)  Utilities: Not At Risk (04/02/2023)  Physical Activity: Inactive (03/12/2022)  Social Connections: Socially Isolated (03/12/2022)  Tobacco Use: Medium Risk (04/16/2023)    Readmission Risk Interventions     No data to display

## 2023-04-24 NOTE — Progress Notes (Addendum)
STROKE TEAM PROGRESS NOTE   BRIEF HPI Casey Rangel is a 72 y.o. 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 Underwent mechanic thrombectomy of L MCA M1 (TICI 3) complicated by contrast stain in posterior L putamen.   SIGNIFICANT HOSPITAL EVENTS 8/29: mechanic thrombectomy of L MCA M1 (TICI 3) requiring rescue left MCA angioplasty and stenting complicated by contrast stain L sylvian fissure 8/31 - pulled cortrak tube -> NG inserted 9/1 - pulled NG tube -> NG reinserted -> pulled 9/2: speech eval, able to take meds crushed with applesauce  INTERIM HISTORY/SUBJECTIVE No family at bedside.  Patient not ding well with eating po foods and calorie count is down. Will place core trak tube today and begin TF's.  K 3.2, will replace and check in am   OBJECTIVE  CBC    Component Value Date/Time   WBC 9.4 04/24/2023 0449   RBC 4.96 04/24/2023 0449   HGB 15.4 04/24/2023 0449   HCT 44.2 04/24/2023 0449   PLT 306 04/24/2023 0449   MCV 89.1 04/24/2023 0449   MCH 31.0 04/24/2023 0449   MCHC 34.8 04/24/2023 0449   RDW 12.9 04/24/2023 0449   LYMPHSABS 1.0 04/17/2023 0552   MONOABS 0.8 04/17/2023 0552   EOSABS 0.0 04/17/2023 0552   BASOSABS 0.0 04/17/2023 0552    BMET    Component Value Date/Time   NA 138 04/24/2023 0449   K 3.2 (L) 04/24/2023 0449   CL 105 04/24/2023 0449   CO2 24 04/24/2023 0449   GLUCOSE 103 (H) 04/24/2023 0449   BUN 9 04/24/2023 0449   CREATININE 0.74 04/24/2023 0449   CALCIUM 8.5 (L) 04/24/2023 0449   GFRNONAA >60 04/24/2023 0449   IMAGING past 24 hours No results found.  Vitals:   04/23/23 2300 04/24/23 0345 04/24/23 0500 04/24/23 0821  BP: 130/72 118/89  (!) 166/73  Pulse: 80 (!) 55  (!) 50  Resp:    18  Temp: 99.3 F (37.4 C) (!) 97.5 F (36.4 C)  98 F (36.7  C)  TempSrc: Axillary Oral  Oral  SpO2: 100% 99%  100%  Weight:   72.9 kg   Height:        PHYSICAL EXAM General: intubated CV: Regular rate and rhythm on monitor Respiratory:  synchronous with ventilator GI: Abdomen soft and nontender  NEURO:  Neuro -, eyes open, global aphasia, nonverbal and not following verbal commands, not able to pantomime, not able to repeat or name. Left gaze preference but able to have right gaze, blinking to visual threat on the left but not on right. Right facial droop. Tongue protrusion not cooperative.  LUE 4+/5 and RUE 4/5 with slight pronator drift. LLE 3/5 and RLE 3/5, but withdraw less to pain on the right than left.  Sensation, coordination and gait not tested.   ASSESSMENT/PLAN  Acute Ischemic Infarct:  left MCA scattered infarcts due to left M2 MCA occlusion s/p IR with TICI3 and left M2 stenting, etiology likely large vessel disease  Code Stroke CT head Acute L MCA territory infarct involving L insula. Small vessel disease. ASPECTS 9.   CTA head & neck Acute occlusion of a proximal left M2, left M1 moderate stenosis, right ICA bulb ulcerated plaque, bilateral ICA bulb atherosclerosis. CT perfusion 45/91 S/p IR with TICI3 and left M2 stenting CT head  s/p stent L MCA, Hyperdense material in the left Sylvian fissure, likely extravasated contrast. MRI  Multifocal acute ischemia within the left MCA territory. No hemorrhage or mass effect. 2D Echo EF 45-50%. Global hypokinesis of LV LDL 132 HgbA1c 5.2 VTE prophylaxis - lovenox 40 aspirin 81 mg daily prior to admission, now on aspirin 81 mg daily and Brilinta (ticagrelor) 90 mg bid for stent.   Therapy recommendations:  SNF Disposition:  pending  Hx of Stroke/TIA 01/2022 admitted for stroke. CT showed old left parietal occipital, right PCA and right MCA/ACA infarcts.  CTA head and neck left ICA terminus string sign and possible occlusion.  CTP positive penumbra.  Status post IR with TICI3. MRI  showed left patchy MCA infarct. MRA showed left ICA terminus patent but left M1 moderate stenosis.  EF 35 to 40%, LDL 129, A1c 5.1.  Patient discharged on DAPT and Crestor 20. 30-day cardiac event monitoring as outpatient showed no active but only has 7 days of data.  Hypertension HFrEF Home meds:  coreg 3.125 twice daily, entresto 24-26 twice daily Stable On Coreg 3.125 Long-term BP goal normotensive  Hyperlipidemia Home meds:  none LDL 132, goal < 70 On crestor 20 Continue statin at discharge  Dysphagia Currently on dysphagia 1 diet with honey thick liquids Able to take medications with applesauce Pulled off cortrak-> NG placed -> pulled off NG -> NG re-inserted->pulled off again Holding off on coretrak as pt passed swallow  Coretrak placed 9/6  Other Stroke Risk Factors ETOH use, alcohol level <10, advised to drink no more than 2 drink(s) a day Congestive heart failure Former smoker  Other Active Problems Leukocytosis, improved: WBC 11.8--11.1--10.8-9.8 -> 7.6 AKI, creatinine 1.22--1.04--0.99-1.05 -> 0.81  Hospital day # 8   Pt seen by Neuro NP/APP and later by MD. Note/plan to be edited by MD as needed.    Gevena Mart DNP, ACNPC-AG  [facility]  I have personally obtained history,examined this patient, reviewed notes, independently viewed imaging studies, participated in medical decision making and plan of care.ROS completed by me personally and pertinent positives fully documented  I have made any additions or clarifications directly to the above note. Agree with note above.  Patient remains globally aphasic and is not following commands or speaking.  He has not been eating enough to maintain his nourishment hence will place core track tube for nourishment.  No family at the bedside.  If swallowing function does not improve over the next few days may need to consider PEG tube placement early next week  Delia Heady, MD Medical Director [facility] Pager: [phone] 04/24/2023 1:21 PM      To contact Stroke Continuity provider, please refer to [email or web address]. After hours, contact General Neurology

## 2023-04-24 NOTE — Plan of Care (Signed)
  Problem: Ischemic Stroke/TIA Tissue Perfusion: Goal: Complications of ischemic stroke/TIA will be minimized Outcome: Progressing   Problem: Nutrition: Goal: Risk of aspiration will decrease Outcome: Progressing Goal: Dietary intake will improve Outcome: Progressing   Problem: Cardiovascular: Goal: Ability to achieve and maintain adequate cardiovascular perfusion will improve Outcome: Progressing   Problem: Clinical Measurements: Goal: Ability to maintain clinical measurements within normal limits will improve Outcome: Progressing Goal: Diagnostic test results will improve Outcome: Progressing Goal: Cardiovascular complication will be avoided Outcome: Progressing   Problem: Nutrition: Goal: Adequate nutrition will be maintained Outcome: Progressing

## 2023-04-24 NOTE — Progress Notes (Signed)
Physical Therapy Treatment Patient Details Name: Casey Rangel MRN: [id number] DOB: [birth year] Today's Date: 04/24/2023   History of Present Illness Pt is a 72 y/o male presenting on 8/29 with L gaze, R sided weakness, and global aphasia. CT with concern for L MCA CVA, CT angiogram with occlusion of proximal L MCA M2. S/P thrombectomy 8/29. PMH L hip fx ; T9-11 fusion, L MCA CVA with residual aphasia, recent small volume SDH 01/2023., alzheimer's dementia.    PT Comments  Tolerated treatment well, seemed eager to get OOB and mobilize. Responding to one step commands the majority of session. Having difficulty scanning Rt for hazards/obstacles while ambulating, also drifting heavily towards Rt, required constant min assist to control RW. Patient will continue to benefit from skilled physical therapy services to further improve independence with functional mobility.     If plan is discharge home, recommend the following: Assistance with cooking/housework;Direct supervision/assist for medications management;Assist for transportation;Help with stairs or ramp for entrance;Supervision due to cognitive status;Assistance with feeding;A lot of help with walking and/or transfers;A lot of help with bathing/dressing/bathroom   Can travel by private vehicle     Yes  Equipment Recommendations  Other (comment) (TBD)    Recommendations for Other Services       Precautions / Restrictions Precautions Precautions: Fall Precaution Comments: aphasia Required Braces or Orthoses: Other Brace Other Brace: mittens restraints Restrictions Weight Bearing Restrictions: No     Mobility  Bed Mobility Overal bed mobility: Needs Assistance Bed Mobility: Supine to Sit, Sit to Supine     Supine to sit: Min assist Sit to supine: Contact guard assist   General bed mobility comments: Min assist to rise to EOB for trunk support, cues to facilitate LEs. CGA to return to bed, able to lift LEs back into bed.     Transfers Overall transfer level: Needs assistance Equipment used: Rolling walker (2 wheels) Transfers: Sit to/from Stand, Bed to chair/wheelchair/BSC Sit to Stand: Min assist           General transfer comment: Min assist for balance when rising from bed with posterior lean initially. Reliant on RW to stabilize. Assist with lines.    Ambulation/Gait Ambulation/Gait assistance: Min assist Gait Distance (Feet): 60 Feet Assistive device: Rolling walker (2 wheels) Gait Pattern/deviations: Decreased stance time - right, Decreased stride length, Decreased dorsiflexion - right, Shuffle, Drifts right/left, Trunk flexed, Decreased weight shift to right, Step-through pattern, Decreased step length - right Gait velocity: decreased Gait velocity interpretation: <1.31 ft/sec, indicative of household ambulator   General Gait Details: Heavy rightward drift today, requiring constant min assist for RW control. Cues to scan for obstacles on Rt with notable neglect. Frequent cues for gait symmetry, awareness, and scanning.   Stairs             Wheelchair Mobility     Tilt Bed    Modified Rankin (Stroke Patients Only) Modified Rankin (Stroke Patients Only) Pre-Morbid Rankin Score: Slight disability Modified Rankin: Moderately severe disability     Balance Overall balance assessment: Needs assistance Sitting-balance support: Feet supported, No upper extremity supported Sitting balance-Leahy Scale: Fair Sitting balance - Comments: sits EOB with close supervision   Standing balance support: Bilateral upper extremity supported, During functional activity Standing balance-Leahy Scale: Poor Standing balance comment: reliant on BUE support for balance                            Cognition Arousal: Alert Behavior During Therapy:  Flat affect Overall Cognitive Status: No family/caregiver present to determine baseline cognitive functioning                                  General Comments: following 1 step commands with increased time        Exercises      General Comments General comments (skin integrity, edema, etc.): Alerted RN of feeding pump displaying error      Pertinent Vitals/Pain Pain Assessment Pain Assessment: PAINAD Breathing: normal Negative Vocalization: none Facial Expression: smiling or inexpressive Body Language: relaxed Consolability: no need to console PAINAD Score: 0 Pain Intervention(s): Monitored during session    Home Living                          Prior Function            PT Goals (current goals can now be found in the care plan section) Acute Rehab PT Goals Patient Stated Goal: none stated at this time PT Goal Formulation: Patient unable to participate in goal setting Time For Goal Achievement: 05/01/23 Potential to Achieve Goals: Fair Progress towards PT goals: Progressing toward goals    Frequency    Min 1X/week      PT Plan      Co-evaluation              AM-PAC PT "6 Clicks" Mobility   Outcome Measure  Help needed turning from your back to your side while in a flat bed without using bedrails?: A Little Help needed moving from lying on your back to sitting on the side of a flat bed without using bedrails?: A Little Help needed moving to and from a bed to a chair (including a wheelchair)?: A Little Help needed standing up from a chair using your arms (e.g., wheelchair or bedside chair)?: A Little Help needed to walk in hospital room?: A Lot Help needed climbing 3-5 steps with a railing? : Total 6 Click Score: 15    End of Session Equipment Utilized During Treatment: Gait belt Activity Tolerance: Patient tolerated treatment well Patient left: in bed;with call bell/phone within reach;with bed alarm set;Other (comment) (Mittens on bil hands; Lab tech in room for draw) Nurse Communication: Mobility status (feeding pump error, Rt SCD malfunction) PT Visit Diagnosis:  Unsteadiness on feet (R26.81);Other abnormalities of gait and mobility (R26.89);Muscle weakness (generalized) (M62.81);Difficulty in walking, not elsewhere classified (R26.2);Other symptoms and signs involving the nervous system (R29.898);Hemiplegia and hemiparesis Hemiplegia - Right/Left: Right Hemiplegia - dominant/non-dominant: Dominant Hemiplegia - caused by: Cerebral infarction     Time: 1700-1714 PT Time Calculation (min) (ACUTE ONLY): 14 min  Charges:    $Gait Training: 8-22 mins PT General Charges $$ ACUTE PT VISIT: 1 Visit                     Kathlyn Sacramento, PT, DPT [facility]  Rehabilitation Services Physical Therapist Office: [phone] Website: [email or web address]    Berton Mount 04/24/2023, 5:31 PM

## 2023-04-24 NOTE — Progress Notes (Signed)
Calorie Count Note  48 hour calorie count ordered. Day 2 results:  Diet: dysphagia 2 with nectar thick liquids (advanced to this diet 9/5) Supplements: Mighty Shake po TID with meals (330 kcal, 9 grams of protein per shake)  Lunch 9/5: 3 bites of pot roast with gravy, 3 bites mashed potatoes, 3 bites minced carrots, 50% nectar thick tea (64 kcal, 1 gram protein) Dinner 9/5: N/A - no documented intake at dinner Breakfast 9/6: bites of grits, scrambled eggs, pork sausage patty, a couple spoonfuls of Mighty Shake, 100% of nectar thick cranberry juice (110 kcal, 1 gram protein) Supplements: N/A  Total intake Day 2: 174 kcal (10% of minimum estimated needs)  2 grams of protein (2% of minimum estimated needs)  Estimated Nutritional Needs:  Kcal:  1800-2000 Protein:  90-105g Fluid:  >/=1.8L  Nutrition Dx: Moderate Malnutrition related to chronic illness (stroke, dementia, SDH) as evidenced by mild fat depletion, severe muscle depletion.   Goal: Patient will meet greater than or equal to 90% of their needs   Intervention:  -48 hour calorie count now complete so will discontinue order. -After discussion with team plan for placement of Cortrak tube and initiation of tube feeds today. See full follow-up RD note for follow-up assessment and updated nutrition intervention.  Letta Median, MS, RD, LDN, CNSC Pager number available on Amion

## 2023-04-25 LAB — GLUCOSE, CAPILLARY
Glucose-Capillary: 155 mg/dL — ABNORMAL HIGH (ref 70–99)
Glucose-Capillary: 147 mg/dL — ABNORMAL HIGH (ref 70–99)
Glucose-Capillary: 85 mg/dL (ref 70–99)
Glucose-Capillary: 96 mg/dL (ref 70–99)
Glucose-Capillary: 201 mg/dL — ABNORMAL HIGH (ref 70–99)
Glucose-Capillary: 94 mg/dL (ref 70–99)

## 2023-04-25 LAB — BASIC METABOLIC PANEL
Anion gap: 15 (ref 5–15)
BUN: 12 mg/dL (ref 8–23)
CO2: 22 mmol/L (ref 22–32)
Calcium: 8.7 mg/dL — ABNORMAL LOW (ref 8.9–10.3)
Chloride: 101 mmol/L (ref 98–111)
Creatinine, Ser: 0.75 mg/dL (ref 0.61–1.24)
GFR, Estimated: >60 mL/min (ref >60)
Glucose, Bld: 100 mg/dL — ABNORMAL HIGH (ref 70–99)
Potassium: 3.6 mmol/L (ref 3.5–5.1)
Sodium: 138 mmol/L (ref 135–145)

## 2023-04-25 LAB — CBC
HCT: 45.4 % (ref 39.0–52.0)
Hemoglobin: 15.7 g/dL (ref 13.0–17.0)
MCH: 31 pg (ref 26.0–34.0)
MCHC: 34.6 g/dL (ref 30.0–36.0)
MCV: 89.5 fL (ref 80.0–100.0)
Platelets: 343 K/uL (ref 150–400)
RBC: 5.07 MIL/uL (ref 4.22–5.81)
RDW: 13.1 % (ref 11.5–15.5)
WBC: 9.7 K/uL (ref 4.0–10.5)
nRBC: 0 % (ref 0.0–0.2)

## 2023-04-25 LAB — PHOSPHORUS
Phosphorus: 3.3 mg/dL (ref 2.5–4.6)
Phosphorus: 3.1 mg/dL (ref 2.5–4.6)

## 2023-04-25 LAB — MAGNESIUM
Magnesium: 2 mg/dL (ref 1.7–2.4)
Magnesium: 2.2 mg/dL (ref 1.7–2.4)

## 2023-04-25 MED ORDER — SENNOSIDES-DOCUSATE SODIUM 8.6-50 MG PO TABS: 1.0000 | ORAL_TABLET | Freq: Every evening | ORAL | Status: DC | PRN

## 2023-04-25 MED ORDER — POTASSIUM CHLORIDE 20 MEQ PO PACK
20.0000 meq | PACK | Freq: Every day | ORAL | Status: DC
  Administered 2023-04-25 – 2023-04-26 (×2): 20 meq via ORAL
  Filled 2023-04-25 (×2): qty 1

## 2023-04-25 MED ORDER — CARVEDILOL 3.125 MG PO TABS
3.1250 mg | ORAL_TABLET | Freq: Two times a day (BID) | ORAL | Status: DC
  Administered 2023-04-25 – 2023-04-29 (×8): 3.125 mg via ORAL
  Filled 2023-04-25 (×10): qty 1

## 2023-04-25 MED ORDER — DONEPEZIL HCL 10 MG PO TABS
5.0000 mg | ORAL_TABLET | Freq: Every day | ORAL | Status: DC
  Administered 2023-04-25 – 2023-04-29 (×5): 5 mg via ORAL
  Filled 2023-04-25 (×5): qty 1

## 2023-04-25 MED ORDER — QUETIAPINE FUMARATE 25 MG PO TABS
12.5000 mg | ORAL_TABLET | Freq: Once | ORAL | Status: AC
  Administered 2023-04-25: 12.5 mg via ORAL
  Filled 2023-04-25: qty 1

## 2023-04-25 MED ORDER — ROSUVASTATIN CALCIUM 20 MG PO TABS
20.0000 mg | ORAL_TABLET | Freq: Every day | ORAL | Status: DC
  Administered 2023-04-25 – 2023-04-29 (×5): 20 mg via ORAL
  Filled 2023-04-25 (×5): qty 1

## 2023-04-25 NOTE — Progress Notes (Signed)
Pt willing to take PO meds crushed in applesauce today, however, pt has refused all meals. Pt assisted with eating for all 3 meals and when given food or drink and encouraged to swallow, pt let food drain out of mouth or pocketed food in right cheek. When performing mouthcare/suctioning, pt grabbed suction from RN and pushed it away.

## 2023-04-25 NOTE — Progress Notes (Signed)
Speech Language Pathology Treatment: Dysphagia  Patient Details Name: Casey Rangel MRN: [id number] DOB: 1950/10/15 Today's Date: 04/25/2023 Time: 8413-2440 SLP Time Calculation (min) (ACUTE ONLY): 15 min  Assessment / Plan / Recommendation Clinical Impression  Pt seen for swallow re-assessment/diet check d/t MD (neurologist) request.  Pt consumed nectar-thickened liquids/puree/soft solids via tsp/cup with self-feeding given full supervision/A with delayed swallow onset and oral holding prior to swallow initiation (which was seen on MBS on 9/5 without aspiration present); impaired mastication efforts noted with soft solid, but pt currently consuming Dysphagia 2/nectar-thickened liquids with A during meals.  Pt's global aphasia impacts communication and following directives which could impact diet consumption in general.  Pt able to assist with self-feeding and appeared eager to consume foods/liquid given during trial with max verbal cues d/t HOH/global aphasia.  Recommend continuing current diet of Dysphagia 2(minced)/nectar-thickened liquids with FULL A/supervision with meals for implementation of strategies to consume diet safely.  Consuming adequate amount of food/liquids to maintain nutrition/hydration may be a challenge without FULL A during meals to limit volume/implement strategies for safe swallowing.  ST will continue to f/u in acute setting for dysphagia tx/management.  HPI HPI: Casey Rangel is a 72 y.o. male who presents with right-sided weakness, left gaze deviation and global aphasia/mute on 04/16/23. MRI shows Multifocal acute ischemia within the left MCA territory. No hemorrhage or mass effect. 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 Recommended dys2/thin on MBS in 2023. Pt also  with recent BL small volume SDH in June 2024. who presents with right-sided weakness, left gaze deviation and global aphasia/mute on 04/16/23;MBS 9/5 rec'd D2/thin with potential progression to D3/thin;ST f/u for diet tolerance/dysphagia tx.      SLP Plan  Continue with current plan of care      Recommendations for follow up therapy are one component of a multi-disciplinary discharge planning process, led by the attending physician.  Recommendations may be updated based on patient status, additional functional criteria and insurance authorization.    Recommendations  Diet recommendations: Dysphagia 2 (fine chop);Nectar-thick liquid Liquids provided via: Teaspoon;Cup Medication Administration: Crushed with puree Supervision: Staff to assist with self feeding;Full supervision/cueing for compensatory strategies Compensations: Minimize environmental distractions;Slow rate;Small sips/bites Postural Changes and/or Swallow Maneuvers: Seated upright 90 degrees                  Oral care before and after PO   Frequent or constant Supervision/Assistance Dysphagia, oropharyngeal phase (R13.12)     Continue with current plan of care     Casey Rangel,M.S.,CCC-SLP  04/25/2023, 12:04 PM

## 2023-04-25 NOTE — Progress Notes (Addendum)
STROKE TEAM PROGRESS NOTE   BRIEF HPI Casey Rangel is a 72 y.o. 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 Underwent mechanic thrombectomy of L MCA M1 (TICI 3) complicated by contrast stain in posterior L putamen.   SIGNIFICANT HOSPITAL EVENTS 8/29: mechanic thrombectomy of L MCA M1 (TICI 3) requiring rescue left MCA angioplasty and stenting complicated by contrast stain L sylvian fissure 8/31 - pulled cortrak tube -> NG inserted 9/1 - pulled NG tube -> NG reinserted -> pulled 9/2: speech eval, able to take meds crushed with applesauce  INTERIM HISTORY/SUBJECTIVE No family at bedside.  Patient pulled out core trak tube again yesterday  Spoke with patients sister [name] about the consideration of placing a PEG tube and she states he would not want that to be done. She will come up to the hospital tomorrow and meet with [place] in the afternoon around 1230.   Will try seroquel to see if can help with agitation. Will check QTC  Neurological exam is unchanged and stable.   OBJECTIVE  CBC    Component Value Date/Time   WBC 9.7 04/25/2023 0749   RBC 5.07 04/25/2023 0749   HGB 15.7 04/25/2023 0749   HCT 45.4 04/25/2023 0749   PLT 343 04/25/2023 0749   MCV 89.5 04/25/2023 0749   MCH 31.0 04/25/2023 0749   MCHC 34.6 04/25/2023 0749   RDW 13.1 04/25/2023 0749   LYMPHSABS 1.0 04/17/2023 0552   MONOABS 0.8 04/17/2023 0552   EOSABS 0.0 04/17/2023 0552   BASOSABS 0.0 04/17/2023 0552    BMET    Component Value Date/Time   NA 138 04/25/2023 0749   K 3.6 04/25/2023 0749   CL 101 04/25/2023 0749   CO2 22 04/25/2023 0749   GLUCOSE 100 (H) 04/25/2023 0749   BUN 12 04/25/2023 0749   CREATININE 0.75 04/25/2023 0749   CALCIUM 8.7 (L) 04/25/2023 0749   GFRNONAA >60 04/25/2023 0749   IMAGING past 24  hours No results found.  Vitals:   04/25/23 0146 04/25/23 0333 04/25/23 0819 04/25/23 1145  BP: 121/70 (!) 150/65 (!) 144/66 (!) 145/86  Pulse: (!) 54 (!) 48 (!) 50 (!) 52  Resp:   16 19  Temp: 98 F (36.7 C) 97.7 F (36.5 C) 98.3 F (36.8 C) 97.7 F (36.5 C)  TempSrc: Oral Oral Axillary Oral  SpO2: 100% 100% 100% 100%  Weight:      Height:        PHYSICAL EXAM General: intubated CV: Regular rate and rhythm on monitor Respiratory:  synchronous with ventilator GI: Abdomen soft and nontender  NEURO:  Neuro -, eyes open, global aphasia, nonverbal and not following verbal commands, not able to pantomime, not able to repeat or name. Left gaze preference but able to have right gaze, blinking to visual threat on the left but not on right. Right facial droop. Tongue protrusion not cooperative.  LUE 4+/5 and RUE 4/5 with slight pronator drift. LLE 3/5 and RLE 3/5, but withdraw less to pain on the right than left.  Sensation, coordination and gait not tested.   ASSESSMENT/PLAN  Acute Ischemic Infarct:  left MCA scattered infarcts due to left M2 MCA occlusion s/p IR with TICI3 and left M2 stenting, etiology likely large vessel disease  Code Stroke CT head Acute L MCA territory infarct involving L  insula. Small vessel disease. ASPECTS 9.   CTA head & neck Acute occlusion of a proximal left M2, left M1 moderate stenosis, right ICA bulb ulcerated plaque, bilateral ICA bulb atherosclerosis. CT perfusion 45/91 S/p IR with TICI3 and left M2 stenting CT head s/p stent L MCA, Hyperdense material in the left Sylvian fissure, likely extravasated contrast. MRI  Multifocal acute ischemia within the left MCA territory. No hemorrhage or mass effect. 2D Echo EF 45-50%. Global hypokinesis of LV LDL 132 HgbA1c 5.2 VTE prophylaxis - lovenox 40 aspirin 81 mg daily prior to admission, now on aspirin 81 mg daily and Brilinta (ticagrelor) 90 mg bid for stent.   Therapy recommendations:  SNF Disposition:   pending  Hx of Stroke/TIA 01/2022 admitted for stroke. CT showed old left parietal occipital, right PCA and right MCA/ACA infarcts.  CTA head and neck left ICA terminus string sign and possible occlusion.  CTP positive penumbra.  Status post IR with TICI3. MRI showed left patchy MCA infarct. MRA showed left ICA terminus patent but left M1 moderate stenosis.  EF 35 to 40%, LDL 129, A1c 5.1.  Patient discharged on DAPT and Crestor 20. 30-day cardiac event monitoring as outpatient showed no active but only has 7 days of data.  Hypertension HFrEF Home meds:  coreg 3.125 twice daily, entresto 24-26 twice daily Stable On Coreg 3.125 Long-term BP goal normotensive  Hyperlipidemia Home meds:  none LDL 132, goal < 70 On crestor 20 Continue statin at discharge  Agitation  Start seroquel 12.5 mg  Check EKG   Dysphagia Currently on dysphagia 1 diet with honey thick liquids Able to take medications with applesauce Pulled off cortrak-> NG placed -> pulled off NG -> NG re-inserted->pulled off again Holding off on coretrak as pt passed swallow  Coretrak placed 9/6. Pulled out again last night  Sister [name] does not want PEG tube placed   Other Stroke Risk Factors ETOH use, alcohol level <10, advised to drink no more than 2 drink(s) a day Congestive heart failure Former smoker  Other Active Problems Leukocytosis, improved: WBC 11.8--11.1--10.8-9.8 -> 7.6 AKI, creatinine 1.22--1.04--0.99-1.05 -> 0.81  Hospital day # 9   Pt seen by Neuro NP/APP and later by MD. Note/plan to be edited by MD as needed.    Gevena Mart DNP, ACNPC-AG  [facility]  I have personally obtained history,examined this patient, reviewed notes, independently viewed imaging studies, participated in medical decision making and plan of care.ROS completed by me personally and pertinent positives fully documented  I have made any additions or clarifications directly to the above note. Agree with note above.   Patient remains agitated requiring restraints and he pulled out his core track early next week..  Last speech therapy to reevaluate him and if he is unable to swallow safely may consider PEG tube early next week.  Start Seroquel for agitation.  No family available at the bedside for discussion.  Greater than 50% time during this 35-minute visit was spent in counseling and coordination of care and discussion patient and care team about his dysphagia Delia Heady, MD Medical Director [facility] Pager: [phone] 04/25/2023 1:55 PM  To contact Stroke Continuity provider, please refer to [email or web address]. After hours, contact General Neurology

## 2023-04-26 LAB — GLUCOSE, CAPILLARY
Glucose-Capillary: 105 mg/dL — ABNORMAL HIGH (ref 70–99)
Glucose-Capillary: 108 mg/dL — ABNORMAL HIGH (ref 70–99)
Glucose-Capillary: 152 mg/dL — ABNORMAL HIGH (ref 70–99)
Glucose-Capillary: 69 mg/dL — ABNORMAL LOW (ref 70–99)
Glucose-Capillary: 82 mg/dL (ref 70–99)
Glucose-Capillary: 89 mg/dL (ref 70–99)

## 2023-04-26 LAB — CBC
HCT: 46.1 % (ref 39.0–52.0)
Hemoglobin: 15.9 g/dL (ref 13.0–17.0)
MCH: 30.1 pg (ref 26.0–34.0)
MCHC: 34.5 g/dL (ref 30.0–36.0)
MCV: 87.3 fL (ref 80.0–100.0)
Platelets: 357 10*3/uL (ref 150–400)
RBC: 5.28 MIL/uL (ref 4.22–5.81)
RDW: 13.1 % (ref 11.5–15.5)
WBC: 9.3 10*3/uL (ref 4.0–10.5)
nRBC: 0 % (ref 0.0–0.2)

## 2023-04-26 LAB — BASIC METABOLIC PANEL
Anion gap: 9 (ref 5–15)
BUN: 14 mg/dL (ref 8–23)
CO2: 22 mmol/L (ref 22–32)
Calcium: 8.6 mg/dL — ABNORMAL LOW (ref 8.9–10.3)
Chloride: 106 mmol/L (ref 98–111)
Creatinine, Ser: 0.86 mg/dL (ref 0.61–1.24)
GFR, Estimated: >60 mL/min (ref >60)
Glucose, Bld: 109 mg/dL — ABNORMAL HIGH (ref 70–99)
Potassium: 3.3 mmol/L — ABNORMAL LOW (ref 3.5–5.1)
Sodium: 137 mmol/L (ref 135–145)

## 2023-04-26 LAB — MAGNESIUM
Magnesium: 2.2 mg/dL (ref 1.7–2.4)
Magnesium: 2.1 mg/dL (ref 1.7–2.4)

## 2023-04-26 LAB — PHOSPHORUS
Phosphorus: 3.3 mg/dL (ref 2.5–4.6)
Phosphorus: 3.4 mg/dL (ref 2.5–4.6)

## 2023-04-26 MED ORDER — MEGESTROL ACETATE 400 MG/10ML PO SUSP
400.0000 mg | Freq: Every day | ORAL | Status: DC
  Administered 2023-04-26 – 2023-04-29 (×4): 400 mg via ORAL
  Filled 2023-04-26 (×6): qty 10

## 2023-04-26 MED ORDER — POTASSIUM CHLORIDE 20 MEQ PO PACK
40.0000 meq | PACK | Freq: Every day | ORAL | Status: DC
  Administered 2023-04-27 – 2023-04-29 (×3): 40 meq via ORAL
  Filled 2023-04-26 (×3): qty 2

## 2023-04-26 MED ORDER — QUETIAPINE FUMARATE 25 MG PO TABS
12.5000 mg | ORAL_TABLET | Freq: Two times a day (BID) | ORAL | Status: DC
  Administered 2023-04-26 – 2023-04-29 (×8): 12.5 mg via ORAL
  Filled 2023-04-26 (×8): qty 1

## 2023-04-26 MED ORDER — POTASSIUM CHLORIDE 20 MEQ PO PACK
40.0000 meq | PACK | Freq: Once | ORAL | Status: AC
  Administered 2023-04-26: 40 meq via ORAL
  Filled 2023-04-26: qty 2

## 2023-04-26 MED ORDER — POTASSIUM CHLORIDE CRYS ER 20 MEQ PO TBCR: 40.0000 meq | EXTENDED_RELEASE_TABLET | Freq: Once | ORAL | Status: DC

## 2023-04-26 MED ORDER — MEGESTROL ACETATE 400 MG/10ML PO SUSP
200.0000 mg | Freq: Every day | ORAL | Status: DC
  Filled 2023-04-26: qty 5

## 2023-04-26 NOTE — Progress Notes (Signed)
Pt ate half ice cream, full chocolate pudding, refused fluids

## 2023-04-26 NOTE — Discharge Summary (Addendum)
05/01/2023 1257   PROT 6.2 (L) 04/17/2023 0552   ALBUMIN 3.2 (L) 04/17/2023 0552   AST 23 04/17/2023 0552   ALT 14 04/17/2023 0552   ALKPHOS 52 04/17/2023 0552   BILITOT 1.1 04/17/2023 0552   GFRNONAA >60 05/01/2023 1257   COAGS Lab Results  Component Value Date   INR 1.1 04/16/2023   INR 1.1 02/11/2023   INR 1.1 01/15/2022   Lipid Panel    Component Value Date/Time   CHOL 186 04/17/2023 0600   TRIG 57 04/17/2023 0600   HDL 43 04/17/2023 0600   CHOLHDL 4.3 04/17/2023 0600   VLDL 11 04/17/2023 0600   LDLCALC 132 (H) 04/17/2023 0600   HgbA1C  Lab Results  Component Value Date   HGBA1C 5.2 04/16/2023   Urinalysis    Component Value Date/Time   COLORURINE AMBER (A) 02/11/2023 1037   APPEARANCEUR HAZY (A) 02/11/2023 1037   LABSPEC 1.024 02/11/2023 1037   PHURINE 5.0 02/11/2023 1037    GLUCOSEU NEGATIVE 02/11/2023 1037   HGBUR NEGATIVE 02/11/2023 1037   BILIRUBINUR NEGATIVE 02/11/2023 1037   KETONESUR 5 (A) 02/11/2023 1037   PROTEINUR NEGATIVE 02/11/2023 1037   NITRITE NEGATIVE 02/11/2023 1037   LEUKOCYTESUR NEGATIVE 02/11/2023 1037   Urine Drug Screen     Component Value Date/Time   LABOPIA NONE DETECTED 01/15/2022 1908   COCAINSCRNUR NONE DETECTED 01/15/2022 1908   LABBENZ NONE DETECTED 01/15/2022 1908   AMPHETMU NONE DETECTED 01/15/2022 1908   THCU NONE DETECTED 01/15/2022 1908   LABBARB NONE DETECTED 01/15/2022 1908    Alcohol Level    Component Value Date/Time   ETH <10 04/16/2023 1100     SIGNIFICANT DIAGNOSTIC STUDIES DG Abd Portable 1V  Result Date: 04/24/2023 CLINICAL DATA:  Feeding tube placement. EXAM: PORTABLE ABDOMEN - 1 VIEW COMPARISON:  04/19/2023 FINDINGS: Feeding tube tip in the region of the gastric pylorus or proximal duodenal bulb. Included bowel-gas pattern is normal. Enlarged cardiac silhouette. The included lungs are clear and overexposed. No acute bony abnormality. IMPRESSION: Feeding tube tip in the region of the gastric pylorus or proximal duodenal bulb. Electronically Signed   By: Beckie Salts M.D.   On: 04/24/2023 13:00   DG Swallowing Func-Speech Pathology  Result Date: 04/23/2023 Table formatting from the original result was not included. Modified Barium Swallow Study Patient Details Name: Casey Rangel MRN: [id number] Date of Birth: November 30, 1950 Today's Date: 04/23/2023 HPI/PMH: HPI: Casey Rangel is a 72 y.o. male who presents with right-sided weakness, left gaze deviation and global aphasia/mute on 04/16/23. MRI shows Multifocal acute ischemia within the left MCA territory. No  hemorrhage or mass effect. 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 Recommended dys2/thin on MBS in 2023. Pt also with recent BL small volume SDH in June 2024.  who presents with right-sided weakness, left gaze deviation and global aphasia/mute on 04/16/23. Clinical Impression: Clinical Impression: Pt presents with a primary oral dysphagia with right labial weakness with possible further oral deficits that are difficult to assess given pts global aphasia. Pt is attentive to PO and able to be fed with a spoon or self feed with a cup, though he needs assist to hold cup without spilling. There is mild anterior spillage on the right. Pt could not sip from a straw today. Pt had prolonged  Signed   By: Richarda Overlie M.D.   On: 04/17/2023 13:25   ECHOCARDIOGRAM COMPLETE  Result Date: 04/17/2023    ECHOCARDIOGRAM REPORT   Patient Name:   Casey Rangel Date of Exam: 04/17/2023 Medical Rec #:  [id number]      Height:       70.0 in Accession #:    [id number]     Weight:       168.4 lb Date of Birth:  1950-10-10      BSA:          1.940 m Patient Age:    71 years       BP:           117/66 mmHg Patient Gender: M              HR:           78 bpm. Exam Location:  Inpatient Procedure: 2D Echo, Cardiac Doppler and Color Doppler Indications:    Stroke I63.9  History:        Patient has prior history of Echocardiogram examinations, most                 recent 01/16/2022. CHF, Stroke; Risk Factors:Hypertension and                 Dyslipidemia.  Sonographer:    Lucendia Herrlich Referring Phys: [id number] [facility] [place] IMPRESSIONS  1. Left ventricular ejection fraction, by estimation, is 45 to 50%. Left ventricular ejection fraction by 2D MOD biplane is 47.8 %. The left ventricle has mildly decreased function. The left ventricle demonstrates global hypokinesis. There is moderate asymmetric  left ventricular hypertrophy of the basal-septal segment. Left ventricular diastolic parameters are consistent with Grade I diastolic dysfunction (impaired relaxation).  2. Right ventricular systolic function is low normal. The right ventricular size is normal. There is normal pulmonary artery systolic pressure. The estimated right ventricular systolic pressure is 12.1 mmHg.  3. The mitral valve is abnormal. Trivial mitral valve regurgitation.  4. The aortic valve is grossly normal. Aortic valve regurgitation is trivial.  5. Aortic dilatation noted. There is moderate dilatation of the aortic root, measuring 45 mm. There is mild dilatation of the ascending aorta, measuring 44 mm.  6. The inferior vena cava is normal in size with greater than 50% respiratory variability, suggesting right atrial pressure of 3 mmHg. Comparison(s): Changes from prior study are noted. 01/16/2022: LVEF 35-40%. FINDINGS  Left Ventricle: Left ventricular ejection fraction, by estimation, is 45 to 50%. Left ventricular ejection fraction by 2D MOD biplane is 47.8 %. The left ventricle has mildly decreased function. The left ventricle demonstrates global hypokinesis. The left ventricular internal cavity size was normal in size. There is moderate asymmetric left ventricular hypertrophy of the basal-septal segment. Left ventricular diastolic parameters are consistent with Grade I diastolic dysfunction (impaired relaxation). Indeterminate filling pressures. Right Ventricle: The right ventricular size is normal. No increase in right ventricular wall thickness. Right ventricular systolic function is low normal. There is normal pulmonary artery systolic pressure. The tricuspid regurgitant velocity is 1.51 m/s,  and with an assumed right atrial pressure of 3 mmHg, the estimated right ventricular systolic pressure is 12.1 mmHg. Left Atrium: Left atrial size was normal in size. Right Atrium: Right atrial size was normal in size. Pericardium: There is no  evidence of pericardial effusion. Mitral Valve: The mitral valve is abnormal. Mild mitral annular calcification. Trivial mitral valve regurgitation. Tricuspid Valve: The tricuspid valve is grossly normal. Tricuspid valve  05/01/2023 1257   PROT 6.2 (L) 04/17/2023 0552   ALBUMIN 3.2 (L) 04/17/2023 0552   AST 23 04/17/2023 0552   ALT 14 04/17/2023 0552   ALKPHOS 52 04/17/2023 0552   BILITOT 1.1 04/17/2023 0552   GFRNONAA >60 05/01/2023 1257   COAGS Lab Results  Component Value Date   INR 1.1 04/16/2023   INR 1.1 02/11/2023   INR 1.1 01/15/2022   Lipid Panel    Component Value Date/Time   CHOL 186 04/17/2023 0600   TRIG 57 04/17/2023 0600   HDL 43 04/17/2023 0600   CHOLHDL 4.3 04/17/2023 0600   VLDL 11 04/17/2023 0600   LDLCALC 132 (H) 04/17/2023 0600   HgbA1C  Lab Results  Component Value Date   HGBA1C 5.2 04/16/2023   Urinalysis    Component Value Date/Time   COLORURINE AMBER (A) 02/11/2023 1037   APPEARANCEUR HAZY (A) 02/11/2023 1037   LABSPEC 1.024 02/11/2023 1037   PHURINE 5.0 02/11/2023 1037    GLUCOSEU NEGATIVE 02/11/2023 1037   HGBUR NEGATIVE 02/11/2023 1037   BILIRUBINUR NEGATIVE 02/11/2023 1037   KETONESUR 5 (A) 02/11/2023 1037   PROTEINUR NEGATIVE 02/11/2023 1037   NITRITE NEGATIVE 02/11/2023 1037   LEUKOCYTESUR NEGATIVE 02/11/2023 1037   Urine Drug Screen     Component Value Date/Time   LABOPIA NONE DETECTED 01/15/2022 1908   COCAINSCRNUR NONE DETECTED 01/15/2022 1908   LABBENZ NONE DETECTED 01/15/2022 1908   AMPHETMU NONE DETECTED 01/15/2022 1908   THCU NONE DETECTED 01/15/2022 1908   LABBARB NONE DETECTED 01/15/2022 1908    Alcohol Level    Component Value Date/Time   ETH <10 04/16/2023 1100     SIGNIFICANT DIAGNOSTIC STUDIES DG Abd Portable 1V  Result Date: 04/24/2023 CLINICAL DATA:  Feeding tube placement. EXAM: PORTABLE ABDOMEN - 1 VIEW COMPARISON:  04/19/2023 FINDINGS: Feeding tube tip in the region of the gastric pylorus or proximal duodenal bulb. Included bowel-gas pattern is normal. Enlarged cardiac silhouette. The included lungs are clear and overexposed. No acute bony abnormality. IMPRESSION: Feeding tube tip in the region of the gastric pylorus or proximal duodenal bulb. Electronically Signed   By: Beckie Salts M.D.   On: 04/24/2023 13:00   DG Swallowing Func-Speech Pathology  Result Date: 04/23/2023 Table formatting from the original result was not included. Modified Barium Swallow Study Patient Details Name: Casey Rangel MRN: [id number] Date of Birth: November 30, 1950 Today's Date: 04/23/2023 HPI/PMH: HPI: Casey Rangel is a 72 y.o. male who presents with right-sided weakness, left gaze deviation and global aphasia/mute on 04/16/23. MRI shows Multifocal acute ischemia within the left MCA territory. No  hemorrhage or mass effect. 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 Recommended dys2/thin on MBS in 2023. Pt also with recent BL small volume SDH in June 2024.  who presents with right-sided weakness, left gaze deviation and global aphasia/mute on 04/16/23. Clinical Impression: Clinical Impression: Pt presents with a primary oral dysphagia with right labial weakness with possible further oral deficits that are difficult to assess given pts global aphasia. Pt is attentive to PO and able to be fed with a spoon or self feed with a cup, though he needs assist to hold cup without spilling. There is mild anterior spillage on the right. Pt could not sip from a straw today. Pt had prolonged  05/01/2023 1257   PROT 6.2 (L) 04/17/2023 0552   ALBUMIN 3.2 (L) 04/17/2023 0552   AST 23 04/17/2023 0552   ALT 14 04/17/2023 0552   ALKPHOS 52 04/17/2023 0552   BILITOT 1.1 04/17/2023 0552   GFRNONAA >60 05/01/2023 1257   COAGS Lab Results  Component Value Date   INR 1.1 04/16/2023   INR 1.1 02/11/2023   INR 1.1 01/15/2022   Lipid Panel    Component Value Date/Time   CHOL 186 04/17/2023 0600   TRIG 57 04/17/2023 0600   HDL 43 04/17/2023 0600   CHOLHDL 4.3 04/17/2023 0600   VLDL 11 04/17/2023 0600   LDLCALC 132 (H) 04/17/2023 0600   HgbA1C  Lab Results  Component Value Date   HGBA1C 5.2 04/16/2023   Urinalysis    Component Value Date/Time   COLORURINE AMBER (A) 02/11/2023 1037   APPEARANCEUR HAZY (A) 02/11/2023 1037   LABSPEC 1.024 02/11/2023 1037   PHURINE 5.0 02/11/2023 1037    GLUCOSEU NEGATIVE 02/11/2023 1037   HGBUR NEGATIVE 02/11/2023 1037   BILIRUBINUR NEGATIVE 02/11/2023 1037   KETONESUR 5 (A) 02/11/2023 1037   PROTEINUR NEGATIVE 02/11/2023 1037   NITRITE NEGATIVE 02/11/2023 1037   LEUKOCYTESUR NEGATIVE 02/11/2023 1037   Urine Drug Screen     Component Value Date/Time   LABOPIA NONE DETECTED 01/15/2022 1908   COCAINSCRNUR NONE DETECTED 01/15/2022 1908   LABBENZ NONE DETECTED 01/15/2022 1908   AMPHETMU NONE DETECTED 01/15/2022 1908   THCU NONE DETECTED 01/15/2022 1908   LABBARB NONE DETECTED 01/15/2022 1908    Alcohol Level    Component Value Date/Time   ETH <10 04/16/2023 1100     SIGNIFICANT DIAGNOSTIC STUDIES DG Abd Portable 1V  Result Date: 04/24/2023 CLINICAL DATA:  Feeding tube placement. EXAM: PORTABLE ABDOMEN - 1 VIEW COMPARISON:  04/19/2023 FINDINGS: Feeding tube tip in the region of the gastric pylorus or proximal duodenal bulb. Included bowel-gas pattern is normal. Enlarged cardiac silhouette. The included lungs are clear and overexposed. No acute bony abnormality. IMPRESSION: Feeding tube tip in the region of the gastric pylorus or proximal duodenal bulb. Electronically Signed   By: Beckie Salts M.D.   On: 04/24/2023 13:00   DG Swallowing Func-Speech Pathology  Result Date: 04/23/2023 Table formatting from the original result was not included. Modified Barium Swallow Study Patient Details Name: Casey Rangel MRN: [id number] Date of Birth: November 30, 1950 Today's Date: 04/23/2023 HPI/PMH: HPI: Casey Rangel is a 72 y.o. male who presents with right-sided weakness, left gaze deviation and global aphasia/mute on 04/16/23. MRI shows Multifocal acute ischemia within the left MCA territory. No  hemorrhage or mass effect. 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 Recommended dys2/thin on MBS in 2023. Pt also with recent BL small volume SDH in June 2024.  who presents with right-sided weakness, left gaze deviation and global aphasia/mute on 04/16/23. Clinical Impression: Clinical Impression: Pt presents with a primary oral dysphagia with right labial weakness with possible further oral deficits that are difficult to assess given pts global aphasia. Pt is attentive to PO and able to be fed with a spoon or self feed with a cup, though he needs assist to hold cup without spilling. There is mild anterior spillage on the right. Pt could not sip from a straw today. Pt had prolonged  05/01/2023 1257   PROT 6.2 (L) 04/17/2023 0552   ALBUMIN 3.2 (L) 04/17/2023 0552   AST 23 04/17/2023 0552   ALT 14 04/17/2023 0552   ALKPHOS 52 04/17/2023 0552   BILITOT 1.1 04/17/2023 0552   GFRNONAA >60 05/01/2023 1257   COAGS Lab Results  Component Value Date   INR 1.1 04/16/2023   INR 1.1 02/11/2023   INR 1.1 01/15/2022   Lipid Panel    Component Value Date/Time   CHOL 186 04/17/2023 0600   TRIG 57 04/17/2023 0600   HDL 43 04/17/2023 0600   CHOLHDL 4.3 04/17/2023 0600   VLDL 11 04/17/2023 0600   LDLCALC 132 (H) 04/17/2023 0600   HgbA1C  Lab Results  Component Value Date   HGBA1C 5.2 04/16/2023   Urinalysis    Component Value Date/Time   COLORURINE AMBER (A) 02/11/2023 1037   APPEARANCEUR HAZY (A) 02/11/2023 1037   LABSPEC 1.024 02/11/2023 1037   PHURINE 5.0 02/11/2023 1037    GLUCOSEU NEGATIVE 02/11/2023 1037   HGBUR NEGATIVE 02/11/2023 1037   BILIRUBINUR NEGATIVE 02/11/2023 1037   KETONESUR 5 (A) 02/11/2023 1037   PROTEINUR NEGATIVE 02/11/2023 1037   NITRITE NEGATIVE 02/11/2023 1037   LEUKOCYTESUR NEGATIVE 02/11/2023 1037   Urine Drug Screen     Component Value Date/Time   LABOPIA NONE DETECTED 01/15/2022 1908   COCAINSCRNUR NONE DETECTED 01/15/2022 1908   LABBENZ NONE DETECTED 01/15/2022 1908   AMPHETMU NONE DETECTED 01/15/2022 1908   THCU NONE DETECTED 01/15/2022 1908   LABBARB NONE DETECTED 01/15/2022 1908    Alcohol Level    Component Value Date/Time   ETH <10 04/16/2023 1100     SIGNIFICANT DIAGNOSTIC STUDIES DG Abd Portable 1V  Result Date: 04/24/2023 CLINICAL DATA:  Feeding tube placement. EXAM: PORTABLE ABDOMEN - 1 VIEW COMPARISON:  04/19/2023 FINDINGS: Feeding tube tip in the region of the gastric pylorus or proximal duodenal bulb. Included bowel-gas pattern is normal. Enlarged cardiac silhouette. The included lungs are clear and overexposed. No acute bony abnormality. IMPRESSION: Feeding tube tip in the region of the gastric pylorus or proximal duodenal bulb. Electronically Signed   By: Beckie Salts M.D.   On: 04/24/2023 13:00   DG Swallowing Func-Speech Pathology  Result Date: 04/23/2023 Table formatting from the original result was not included. Modified Barium Swallow Study Patient Details Name: Casey Rangel MRN: [id number] Date of Birth: November 30, 1950 Today's Date: 04/23/2023 HPI/PMH: HPI: Casey Rangel is a 72 y.o. male who presents with right-sided weakness, left gaze deviation and global aphasia/mute on 04/16/23. MRI shows Multifocal acute ischemia within the left MCA territory. No  hemorrhage or mass effect. 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 Recommended dys2/thin on MBS in 2023. Pt also with recent BL small volume SDH in June 2024.  who presents with right-sided weakness, left gaze deviation and global aphasia/mute on 04/16/23. Clinical Impression: Clinical Impression: Pt presents with a primary oral dysphagia with right labial weakness with possible further oral deficits that are difficult to assess given pts global aphasia. Pt is attentive to PO and able to be fed with a spoon or self feed with a cup, though he needs assist to hold cup without spilling. There is mild anterior spillage on the right. Pt could not sip from a straw today. Pt had prolonged  05/01/2023 1257   PROT 6.2 (L) 04/17/2023 0552   ALBUMIN 3.2 (L) 04/17/2023 0552   AST 23 04/17/2023 0552   ALT 14 04/17/2023 0552   ALKPHOS 52 04/17/2023 0552   BILITOT 1.1 04/17/2023 0552   GFRNONAA >60 05/01/2023 1257   COAGS Lab Results  Component Value Date   INR 1.1 04/16/2023   INR 1.1 02/11/2023   INR 1.1 01/15/2022   Lipid Panel    Component Value Date/Time   CHOL 186 04/17/2023 0600   TRIG 57 04/17/2023 0600   HDL 43 04/17/2023 0600   CHOLHDL 4.3 04/17/2023 0600   VLDL 11 04/17/2023 0600   LDLCALC 132 (H) 04/17/2023 0600   HgbA1C  Lab Results  Component Value Date   HGBA1C 5.2 04/16/2023   Urinalysis    Component Value Date/Time   COLORURINE AMBER (A) 02/11/2023 1037   APPEARANCEUR HAZY (A) 02/11/2023 1037   LABSPEC 1.024 02/11/2023 1037   PHURINE 5.0 02/11/2023 1037    GLUCOSEU NEGATIVE 02/11/2023 1037   HGBUR NEGATIVE 02/11/2023 1037   BILIRUBINUR NEGATIVE 02/11/2023 1037   KETONESUR 5 (A) 02/11/2023 1037   PROTEINUR NEGATIVE 02/11/2023 1037   NITRITE NEGATIVE 02/11/2023 1037   LEUKOCYTESUR NEGATIVE 02/11/2023 1037   Urine Drug Screen     Component Value Date/Time   LABOPIA NONE DETECTED 01/15/2022 1908   COCAINSCRNUR NONE DETECTED 01/15/2022 1908   LABBENZ NONE DETECTED 01/15/2022 1908   AMPHETMU NONE DETECTED 01/15/2022 1908   THCU NONE DETECTED 01/15/2022 1908   LABBARB NONE DETECTED 01/15/2022 1908    Alcohol Level    Component Value Date/Time   ETH <10 04/16/2023 1100     SIGNIFICANT DIAGNOSTIC STUDIES DG Abd Portable 1V  Result Date: 04/24/2023 CLINICAL DATA:  Feeding tube placement. EXAM: PORTABLE ABDOMEN - 1 VIEW COMPARISON:  04/19/2023 FINDINGS: Feeding tube tip in the region of the gastric pylorus or proximal duodenal bulb. Included bowel-gas pattern is normal. Enlarged cardiac silhouette. The included lungs are clear and overexposed. No acute bony abnormality. IMPRESSION: Feeding tube tip in the region of the gastric pylorus or proximal duodenal bulb. Electronically Signed   By: Beckie Salts M.D.   On: 04/24/2023 13:00   DG Swallowing Func-Speech Pathology  Result Date: 04/23/2023 Table formatting from the original result was not included. Modified Barium Swallow Study Patient Details Name: Casey Rangel MRN: [id number] Date of Birth: November 30, 1950 Today's Date: 04/23/2023 HPI/PMH: HPI: Casey Rangel is a 72 y.o. male who presents with right-sided weakness, left gaze deviation and global aphasia/mute on 04/16/23. MRI shows Multifocal acute ischemia within the left MCA territory. No  hemorrhage or mass effect. 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 Recommended dys2/thin on MBS in 2023. Pt also with recent BL small volume SDH in June 2024.  who presents with right-sided weakness, left gaze deviation and global aphasia/mute on 04/16/23. Clinical Impression: Clinical Impression: Pt presents with a primary oral dysphagia with right labial weakness with possible further oral deficits that are difficult to assess given pts global aphasia. Pt is attentive to PO and able to be fed with a spoon or self feed with a cup, though he needs assist to hold cup without spilling. There is mild anterior spillage on the right. Pt could not sip from a straw today. Pt had prolonged  Signed   By: Richarda Overlie M.D.   On: 04/17/2023 13:25   ECHOCARDIOGRAM COMPLETE  Result Date: 04/17/2023    ECHOCARDIOGRAM REPORT   Patient Name:   Casey Rangel Date of Exam: 04/17/2023 Medical Rec #:  [id number]      Height:       70.0 in Accession #:    [id number]     Weight:       168.4 lb Date of Birth:  1950-10-10      BSA:          1.940 m Patient Age:    71 years       BP:           117/66 mmHg Patient Gender: M              HR:           78 bpm. Exam Location:  Inpatient Procedure: 2D Echo, Cardiac Doppler and Color Doppler Indications:    Stroke I63.9  History:        Patient has prior history of Echocardiogram examinations, most                 recent 01/16/2022. CHF, Stroke; Risk Factors:Hypertension and                 Dyslipidemia.  Sonographer:    Lucendia Herrlich Referring Phys: [id number] [facility] [place] IMPRESSIONS  1. Left ventricular ejection fraction, by estimation, is 45 to 50%. Left ventricular ejection fraction by 2D MOD biplane is 47.8 %. The left ventricle has mildly decreased function. The left ventricle demonstrates global hypokinesis. There is moderate asymmetric  left ventricular hypertrophy of the basal-septal segment. Left ventricular diastolic parameters are consistent with Grade I diastolic dysfunction (impaired relaxation).  2. Right ventricular systolic function is low normal. The right ventricular size is normal. There is normal pulmonary artery systolic pressure. The estimated right ventricular systolic pressure is 12.1 mmHg.  3. The mitral valve is abnormal. Trivial mitral valve regurgitation.  4. The aortic valve is grossly normal. Aortic valve regurgitation is trivial.  5. Aortic dilatation noted. There is moderate dilatation of the aortic root, measuring 45 mm. There is mild dilatation of the ascending aorta, measuring 44 mm.  6. The inferior vena cava is normal in size with greater than 50% respiratory variability, suggesting right atrial pressure of 3 mmHg. Comparison(s): Changes from prior study are noted. 01/16/2022: LVEF 35-40%. FINDINGS  Left Ventricle: Left ventricular ejection fraction, by estimation, is 45 to 50%. Left ventricular ejection fraction by 2D MOD biplane is 47.8 %. The left ventricle has mildly decreased function. The left ventricle demonstrates global hypokinesis. The left ventricular internal cavity size was normal in size. There is moderate asymmetric left ventricular hypertrophy of the basal-septal segment. Left ventricular diastolic parameters are consistent with Grade I diastolic dysfunction (impaired relaxation). Indeterminate filling pressures. Right Ventricle: The right ventricular size is normal. No increase in right ventricular wall thickness. Right ventricular systolic function is low normal. There is normal pulmonary artery systolic pressure. The tricuspid regurgitant velocity is 1.51 m/s,  and with an assumed right atrial pressure of 3 mmHg, the estimated right ventricular systolic pressure is 12.1 mmHg. Left Atrium: Left atrial size was normal in size. Right Atrium: Right atrial size was normal in size. Pericardium: There is no  evidence of pericardial effusion. Mitral Valve: The mitral valve is abnormal. Mild mitral annular calcification. Trivial mitral valve regurgitation. Tricuspid Valve: The tricuspid valve is grossly normal. Tricuspid valve  05/01/2023 1257   PROT 6.2 (L) 04/17/2023 0552   ALBUMIN 3.2 (L) 04/17/2023 0552   AST 23 04/17/2023 0552   ALT 14 04/17/2023 0552   ALKPHOS 52 04/17/2023 0552   BILITOT 1.1 04/17/2023 0552   GFRNONAA >60 05/01/2023 1257   COAGS Lab Results  Component Value Date   INR 1.1 04/16/2023   INR 1.1 02/11/2023   INR 1.1 01/15/2022   Lipid Panel    Component Value Date/Time   CHOL 186 04/17/2023 0600   TRIG 57 04/17/2023 0600   HDL 43 04/17/2023 0600   CHOLHDL 4.3 04/17/2023 0600   VLDL 11 04/17/2023 0600   LDLCALC 132 (H) 04/17/2023 0600   HgbA1C  Lab Results  Component Value Date   HGBA1C 5.2 04/16/2023   Urinalysis    Component Value Date/Time   COLORURINE AMBER (A) 02/11/2023 1037   APPEARANCEUR HAZY (A) 02/11/2023 1037   LABSPEC 1.024 02/11/2023 1037   PHURINE 5.0 02/11/2023 1037    GLUCOSEU NEGATIVE 02/11/2023 1037   HGBUR NEGATIVE 02/11/2023 1037   BILIRUBINUR NEGATIVE 02/11/2023 1037   KETONESUR 5 (A) 02/11/2023 1037   PROTEINUR NEGATIVE 02/11/2023 1037   NITRITE NEGATIVE 02/11/2023 1037   LEUKOCYTESUR NEGATIVE 02/11/2023 1037   Urine Drug Screen     Component Value Date/Time   LABOPIA NONE DETECTED 01/15/2022 1908   COCAINSCRNUR NONE DETECTED 01/15/2022 1908   LABBENZ NONE DETECTED 01/15/2022 1908   AMPHETMU NONE DETECTED 01/15/2022 1908   THCU NONE DETECTED 01/15/2022 1908   LABBARB NONE DETECTED 01/15/2022 1908    Alcohol Level    Component Value Date/Time   ETH <10 04/16/2023 1100     SIGNIFICANT DIAGNOSTIC STUDIES DG Abd Portable 1V  Result Date: 04/24/2023 CLINICAL DATA:  Feeding tube placement. EXAM: PORTABLE ABDOMEN - 1 VIEW COMPARISON:  04/19/2023 FINDINGS: Feeding tube tip in the region of the gastric pylorus or proximal duodenal bulb. Included bowel-gas pattern is normal. Enlarged cardiac silhouette. The included lungs are clear and overexposed. No acute bony abnormality. IMPRESSION: Feeding tube tip in the region of the gastric pylorus or proximal duodenal bulb. Electronically Signed   By: Beckie Salts M.D.   On: 04/24/2023 13:00   DG Swallowing Func-Speech Pathology  Result Date: 04/23/2023 Table formatting from the original result was not included. Modified Barium Swallow Study Patient Details Name: Casey Rangel MRN: [id number] Date of Birth: November 30, 1950 Today's Date: 04/23/2023 HPI/PMH: HPI: Casey Rangel is a 72 y.o. male who presents with right-sided weakness, left gaze deviation and global aphasia/mute on 04/16/23. MRI shows Multifocal acute ischemia within the left MCA territory. No  hemorrhage or mass effect. 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 Recommended dys2/thin on MBS in 2023. Pt also with recent BL small volume SDH in June 2024.  who presents with right-sided weakness, left gaze deviation and global aphasia/mute on 04/16/23. Clinical Impression: Clinical Impression: Pt presents with a primary oral dysphagia with right labial weakness with possible further oral deficits that are difficult to assess given pts global aphasia. Pt is attentive to PO and able to be fed with a spoon or self feed with a cup, though he needs assist to hold cup without spilling. There is mild anterior spillage on the right. Pt could not sip from a straw today. Pt had prolonged  05/01/2023 1257   PROT 6.2 (L) 04/17/2023 0552   ALBUMIN 3.2 (L) 04/17/2023 0552   AST 23 04/17/2023 0552   ALT 14 04/17/2023 0552   ALKPHOS 52 04/17/2023 0552   BILITOT 1.1 04/17/2023 0552   GFRNONAA >60 05/01/2023 1257   COAGS Lab Results  Component Value Date   INR 1.1 04/16/2023   INR 1.1 02/11/2023   INR 1.1 01/15/2022   Lipid Panel    Component Value Date/Time   CHOL 186 04/17/2023 0600   TRIG 57 04/17/2023 0600   HDL 43 04/17/2023 0600   CHOLHDL 4.3 04/17/2023 0600   VLDL 11 04/17/2023 0600   LDLCALC 132 (H) 04/17/2023 0600   HgbA1C  Lab Results  Component Value Date   HGBA1C 5.2 04/16/2023   Urinalysis    Component Value Date/Time   COLORURINE AMBER (A) 02/11/2023 1037   APPEARANCEUR HAZY (A) 02/11/2023 1037   LABSPEC 1.024 02/11/2023 1037   PHURINE 5.0 02/11/2023 1037    GLUCOSEU NEGATIVE 02/11/2023 1037   HGBUR NEGATIVE 02/11/2023 1037   BILIRUBINUR NEGATIVE 02/11/2023 1037   KETONESUR 5 (A) 02/11/2023 1037   PROTEINUR NEGATIVE 02/11/2023 1037   NITRITE NEGATIVE 02/11/2023 1037   LEUKOCYTESUR NEGATIVE 02/11/2023 1037   Urine Drug Screen     Component Value Date/Time   LABOPIA NONE DETECTED 01/15/2022 1908   COCAINSCRNUR NONE DETECTED 01/15/2022 1908   LABBENZ NONE DETECTED 01/15/2022 1908   AMPHETMU NONE DETECTED 01/15/2022 1908   THCU NONE DETECTED 01/15/2022 1908   LABBARB NONE DETECTED 01/15/2022 1908    Alcohol Level    Component Value Date/Time   ETH <10 04/16/2023 1100     SIGNIFICANT DIAGNOSTIC STUDIES DG Abd Portable 1V  Result Date: 04/24/2023 CLINICAL DATA:  Feeding tube placement. EXAM: PORTABLE ABDOMEN - 1 VIEW COMPARISON:  04/19/2023 FINDINGS: Feeding tube tip in the region of the gastric pylorus or proximal duodenal bulb. Included bowel-gas pattern is normal. Enlarged cardiac silhouette. The included lungs are clear and overexposed. No acute bony abnormality. IMPRESSION: Feeding tube tip in the region of the gastric pylorus or proximal duodenal bulb. Electronically Signed   By: Beckie Salts M.D.   On: 04/24/2023 13:00   DG Swallowing Func-Speech Pathology  Result Date: 04/23/2023 Table formatting from the original result was not included. Modified Barium Swallow Study Patient Details Name: Casey Rangel MRN: [id number] Date of Birth: November 30, 1950 Today's Date: 04/23/2023 HPI/PMH: HPI: Casey Rangel is a 72 y.o. male who presents with right-sided weakness, left gaze deviation and global aphasia/mute on 04/16/23. MRI shows Multifocal acute ischemia within the left MCA territory. No  hemorrhage or mass effect. 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 Recommended dys2/thin on MBS in 2023. Pt also with recent BL small volume SDH in June 2024.  who presents with right-sided weakness, left gaze deviation and global aphasia/mute on 04/16/23. Clinical Impression: Clinical Impression: Pt presents with a primary oral dysphagia with right labial weakness with possible further oral deficits that are difficult to assess given pts global aphasia. Pt is attentive to PO and able to be fed with a spoon or self feed with a cup, though he needs assist to hold cup without spilling. There is mild anterior spillage on the right. Pt could not sip from a straw today. Pt had prolonged  05/01/2023 1257   PROT 6.2 (L) 04/17/2023 0552   ALBUMIN 3.2 (L) 04/17/2023 0552   AST 23 04/17/2023 0552   ALT 14 04/17/2023 0552   ALKPHOS 52 04/17/2023 0552   BILITOT 1.1 04/17/2023 0552   GFRNONAA >60 05/01/2023 1257   COAGS Lab Results  Component Value Date   INR 1.1 04/16/2023   INR 1.1 02/11/2023   INR 1.1 01/15/2022   Lipid Panel    Component Value Date/Time   CHOL 186 04/17/2023 0600   TRIG 57 04/17/2023 0600   HDL 43 04/17/2023 0600   CHOLHDL 4.3 04/17/2023 0600   VLDL 11 04/17/2023 0600   LDLCALC 132 (H) 04/17/2023 0600   HgbA1C  Lab Results  Component Value Date   HGBA1C 5.2 04/16/2023   Urinalysis    Component Value Date/Time   COLORURINE AMBER (A) 02/11/2023 1037   APPEARANCEUR HAZY (A) 02/11/2023 1037   LABSPEC 1.024 02/11/2023 1037   PHURINE 5.0 02/11/2023 1037    GLUCOSEU NEGATIVE 02/11/2023 1037   HGBUR NEGATIVE 02/11/2023 1037   BILIRUBINUR NEGATIVE 02/11/2023 1037   KETONESUR 5 (A) 02/11/2023 1037   PROTEINUR NEGATIVE 02/11/2023 1037   NITRITE NEGATIVE 02/11/2023 1037   LEUKOCYTESUR NEGATIVE 02/11/2023 1037   Urine Drug Screen     Component Value Date/Time   LABOPIA NONE DETECTED 01/15/2022 1908   COCAINSCRNUR NONE DETECTED 01/15/2022 1908   LABBENZ NONE DETECTED 01/15/2022 1908   AMPHETMU NONE DETECTED 01/15/2022 1908   THCU NONE DETECTED 01/15/2022 1908   LABBARB NONE DETECTED 01/15/2022 1908    Alcohol Level    Component Value Date/Time   ETH <10 04/16/2023 1100     SIGNIFICANT DIAGNOSTIC STUDIES DG Abd Portable 1V  Result Date: 04/24/2023 CLINICAL DATA:  Feeding tube placement. EXAM: PORTABLE ABDOMEN - 1 VIEW COMPARISON:  04/19/2023 FINDINGS: Feeding tube tip in the region of the gastric pylorus or proximal duodenal bulb. Included bowel-gas pattern is normal. Enlarged cardiac silhouette. The included lungs are clear and overexposed. No acute bony abnormality. IMPRESSION: Feeding tube tip in the region of the gastric pylorus or proximal duodenal bulb. Electronically Signed   By: Beckie Salts M.D.   On: 04/24/2023 13:00   DG Swallowing Func-Speech Pathology  Result Date: 04/23/2023 Table formatting from the original result was not included. Modified Barium Swallow Study Patient Details Name: Casey Rangel MRN: [id number] Date of Birth: November 30, 1950 Today's Date: 04/23/2023 HPI/PMH: HPI: Casey Rangel is a 72 y.o. male who presents with right-sided weakness, left gaze deviation and global aphasia/mute on 04/16/23. MRI shows Multifocal acute ischemia within the left MCA territory. No  hemorrhage or mass effect. 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 Recommended dys2/thin on MBS in 2023. Pt also with recent BL small volume SDH in June 2024.  who presents with right-sided weakness, left gaze deviation and global aphasia/mute on 04/16/23. Clinical Impression: Clinical Impression: Pt presents with a primary oral dysphagia with right labial weakness with possible further oral deficits that are difficult to assess given pts global aphasia. Pt is attentive to PO and able to be fed with a spoon or self feed with a cup, though he needs assist to hold cup without spilling. There is mild anterior spillage on the right. Pt could not sip from a straw today. Pt had prolonged  05/01/2023 1257   PROT 6.2 (L) 04/17/2023 0552   ALBUMIN 3.2 (L) 04/17/2023 0552   AST 23 04/17/2023 0552   ALT 14 04/17/2023 0552   ALKPHOS 52 04/17/2023 0552   BILITOT 1.1 04/17/2023 0552   GFRNONAA >60 05/01/2023 1257   COAGS Lab Results  Component Value Date   INR 1.1 04/16/2023   INR 1.1 02/11/2023   INR 1.1 01/15/2022   Lipid Panel    Component Value Date/Time   CHOL 186 04/17/2023 0600   TRIG 57 04/17/2023 0600   HDL 43 04/17/2023 0600   CHOLHDL 4.3 04/17/2023 0600   VLDL 11 04/17/2023 0600   LDLCALC 132 (H) 04/17/2023 0600   HgbA1C  Lab Results  Component Value Date   HGBA1C 5.2 04/16/2023   Urinalysis    Component Value Date/Time   COLORURINE AMBER (A) 02/11/2023 1037   APPEARANCEUR HAZY (A) 02/11/2023 1037   LABSPEC 1.024 02/11/2023 1037   PHURINE 5.0 02/11/2023 1037    GLUCOSEU NEGATIVE 02/11/2023 1037   HGBUR NEGATIVE 02/11/2023 1037   BILIRUBINUR NEGATIVE 02/11/2023 1037   KETONESUR 5 (A) 02/11/2023 1037   PROTEINUR NEGATIVE 02/11/2023 1037   NITRITE NEGATIVE 02/11/2023 1037   LEUKOCYTESUR NEGATIVE 02/11/2023 1037   Urine Drug Screen     Component Value Date/Time   LABOPIA NONE DETECTED 01/15/2022 1908   COCAINSCRNUR NONE DETECTED 01/15/2022 1908   LABBENZ NONE DETECTED 01/15/2022 1908   AMPHETMU NONE DETECTED 01/15/2022 1908   THCU NONE DETECTED 01/15/2022 1908   LABBARB NONE DETECTED 01/15/2022 1908    Alcohol Level    Component Value Date/Time   ETH <10 04/16/2023 1100     SIGNIFICANT DIAGNOSTIC STUDIES DG Abd Portable 1V  Result Date: 04/24/2023 CLINICAL DATA:  Feeding tube placement. EXAM: PORTABLE ABDOMEN - 1 VIEW COMPARISON:  04/19/2023 FINDINGS: Feeding tube tip in the region of the gastric pylorus or proximal duodenal bulb. Included bowel-gas pattern is normal. Enlarged cardiac silhouette. The included lungs are clear and overexposed. No acute bony abnormality. IMPRESSION: Feeding tube tip in the region of the gastric pylorus or proximal duodenal bulb. Electronically Signed   By: Beckie Salts M.D.   On: 04/24/2023 13:00   DG Swallowing Func-Speech Pathology  Result Date: 04/23/2023 Table formatting from the original result was not included. Modified Barium Swallow Study Patient Details Name: Casey Rangel MRN: [id number] Date of Birth: November 30, 1950 Today's Date: 04/23/2023 HPI/PMH: HPI: Casey Rangel is a 72 y.o. male who presents with right-sided weakness, left gaze deviation and global aphasia/mute on 04/16/23. MRI shows Multifocal acute ischemia within the left MCA territory. No  hemorrhage or mass effect. 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 Recommended dys2/thin on MBS in 2023. Pt also with recent BL small volume SDH in June 2024.  who presents with right-sided weakness, left gaze deviation and global aphasia/mute on 04/16/23. Clinical Impression: Clinical Impression: Pt presents with a primary oral dysphagia with right labial weakness with possible further oral deficits that are difficult to assess given pts global aphasia. Pt is attentive to PO and able to be fed with a spoon or self feed with a cup, though he needs assist to hold cup without spilling. There is mild anterior spillage on the right. Pt could not sip from a straw today. Pt had prolonged  05/01/2023 1257   PROT 6.2 (L) 04/17/2023 0552   ALBUMIN 3.2 (L) 04/17/2023 0552   AST 23 04/17/2023 0552   ALT 14 04/17/2023 0552   ALKPHOS 52 04/17/2023 0552   BILITOT 1.1 04/17/2023 0552   GFRNONAA >60 05/01/2023 1257   COAGS Lab Results  Component Value Date   INR 1.1 04/16/2023   INR 1.1 02/11/2023   INR 1.1 01/15/2022   Lipid Panel    Component Value Date/Time   CHOL 186 04/17/2023 0600   TRIG 57 04/17/2023 0600   HDL 43 04/17/2023 0600   CHOLHDL 4.3 04/17/2023 0600   VLDL 11 04/17/2023 0600   LDLCALC 132 (H) 04/17/2023 0600   HgbA1C  Lab Results  Component Value Date   HGBA1C 5.2 04/16/2023   Urinalysis    Component Value Date/Time   COLORURINE AMBER (A) 02/11/2023 1037   APPEARANCEUR HAZY (A) 02/11/2023 1037   LABSPEC 1.024 02/11/2023 1037   PHURINE 5.0 02/11/2023 1037    GLUCOSEU NEGATIVE 02/11/2023 1037   HGBUR NEGATIVE 02/11/2023 1037   BILIRUBINUR NEGATIVE 02/11/2023 1037   KETONESUR 5 (A) 02/11/2023 1037   PROTEINUR NEGATIVE 02/11/2023 1037   NITRITE NEGATIVE 02/11/2023 1037   LEUKOCYTESUR NEGATIVE 02/11/2023 1037   Urine Drug Screen     Component Value Date/Time   LABOPIA NONE DETECTED 01/15/2022 1908   COCAINSCRNUR NONE DETECTED 01/15/2022 1908   LABBENZ NONE DETECTED 01/15/2022 1908   AMPHETMU NONE DETECTED 01/15/2022 1908   THCU NONE DETECTED 01/15/2022 1908   LABBARB NONE DETECTED 01/15/2022 1908    Alcohol Level    Component Value Date/Time   ETH <10 04/16/2023 1100     SIGNIFICANT DIAGNOSTIC STUDIES DG Abd Portable 1V  Result Date: 04/24/2023 CLINICAL DATA:  Feeding tube placement. EXAM: PORTABLE ABDOMEN - 1 VIEW COMPARISON:  04/19/2023 FINDINGS: Feeding tube tip in the region of the gastric pylorus or proximal duodenal bulb. Included bowel-gas pattern is normal. Enlarged cardiac silhouette. The included lungs are clear and overexposed. No acute bony abnormality. IMPRESSION: Feeding tube tip in the region of the gastric pylorus or proximal duodenal bulb. Electronically Signed   By: Beckie Salts M.D.   On: 04/24/2023 13:00   DG Swallowing Func-Speech Pathology  Result Date: 04/23/2023 Table formatting from the original result was not included. Modified Barium Swallow Study Patient Details Name: Casey Rangel MRN: [id number] Date of Birth: November 30, 1950 Today's Date: 04/23/2023 HPI/PMH: HPI: Casey Rangel is a 72 y.o. male who presents with right-sided weakness, left gaze deviation and global aphasia/mute on 04/16/23. MRI shows Multifocal acute ischemia within the left MCA territory. No  hemorrhage or mass effect. 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 Recommended dys2/thin on MBS in 2023. Pt also with recent BL small volume SDH in June 2024.  who presents with right-sided weakness, left gaze deviation and global aphasia/mute on 04/16/23. Clinical Impression: Clinical Impression: Pt presents with a primary oral dysphagia with right labial weakness with possible further oral deficits that are difficult to assess given pts global aphasia. Pt is attentive to PO and able to be fed with a spoon or self feed with a cup, though he needs assist to hold cup without spilling. There is mild anterior spillage on the right. Pt could not sip from a straw today. Pt had prolonged  Signed   By: Richarda Overlie M.D.   On: 04/17/2023 13:25   ECHOCARDIOGRAM COMPLETE  Result Date: 04/17/2023    ECHOCARDIOGRAM REPORT   Patient Name:   Casey Rangel Date of Exam: 04/17/2023 Medical Rec #:  [id number]      Height:       70.0 in Accession #:    [id number]     Weight:       168.4 lb Date of Birth:  1950-10-10      BSA:          1.940 m Patient Age:    71 years       BP:           117/66 mmHg Patient Gender: M              HR:           78 bpm. Exam Location:  Inpatient Procedure: 2D Echo, Cardiac Doppler and Color Doppler Indications:    Stroke I63.9  History:        Patient has prior history of Echocardiogram examinations, most                 recent 01/16/2022. CHF, Stroke; Risk Factors:Hypertension and                 Dyslipidemia.  Sonographer:    Lucendia Herrlich Referring Phys: [id number] [facility] [place] IMPRESSIONS  1. Left ventricular ejection fraction, by estimation, is 45 to 50%. Left ventricular ejection fraction by 2D MOD biplane is 47.8 %. The left ventricle has mildly decreased function. The left ventricle demonstrates global hypokinesis. There is moderate asymmetric  left ventricular hypertrophy of the basal-septal segment. Left ventricular diastolic parameters are consistent with Grade I diastolic dysfunction (impaired relaxation).  2. Right ventricular systolic function is low normal. The right ventricular size is normal. There is normal pulmonary artery systolic pressure. The estimated right ventricular systolic pressure is 12.1 mmHg.  3. The mitral valve is abnormal. Trivial mitral valve regurgitation.  4. The aortic valve is grossly normal. Aortic valve regurgitation is trivial.  5. Aortic dilatation noted. There is moderate dilatation of the aortic root, measuring 45 mm. There is mild dilatation of the ascending aorta, measuring 44 mm.  6. The inferior vena cava is normal in size with greater than 50% respiratory variability, suggesting right atrial pressure of 3 mmHg. Comparison(s): Changes from prior study are noted. 01/16/2022: LVEF 35-40%. FINDINGS  Left Ventricle: Left ventricular ejection fraction, by estimation, is 45 to 50%. Left ventricular ejection fraction by 2D MOD biplane is 47.8 %. The left ventricle has mildly decreased function. The left ventricle demonstrates global hypokinesis. The left ventricular internal cavity size was normal in size. There is moderate asymmetric left ventricular hypertrophy of the basal-septal segment. Left ventricular diastolic parameters are consistent with Grade I diastolic dysfunction (impaired relaxation). Indeterminate filling pressures. Right Ventricle: The right ventricular size is normal. No increase in right ventricular wall thickness. Right ventricular systolic function is low normal. There is normal pulmonary artery systolic pressure. The tricuspid regurgitant velocity is 1.51 m/s,  and with an assumed right atrial pressure of 3 mmHg, the estimated right ventricular systolic pressure is 12.1 mmHg. Left Atrium: Left atrial size was normal in size. Right Atrium: Right atrial size was normal in size. Pericardium: There is no  evidence of pericardial effusion. Mitral Valve: The mitral valve is abnormal. Mild mitral annular calcification. Trivial mitral valve regurgitation. Tricuspid Valve: The tricuspid valve is grossly normal. Tricuspid valve  Signed   By: Richarda Overlie M.D.   On: 04/17/2023 13:25   ECHOCARDIOGRAM COMPLETE  Result Date: 04/17/2023    ECHOCARDIOGRAM REPORT   Patient Name:   Casey Rangel Date of Exam: 04/17/2023 Medical Rec #:  [id number]      Height:       70.0 in Accession #:    [id number]     Weight:       168.4 lb Date of Birth:  1950-10-10      BSA:          1.940 m Patient Age:    71 years       BP:           117/66 mmHg Patient Gender: M              HR:           78 bpm. Exam Location:  Inpatient Procedure: 2D Echo, Cardiac Doppler and Color Doppler Indications:    Stroke I63.9  History:        Patient has prior history of Echocardiogram examinations, most                 recent 01/16/2022. CHF, Stroke; Risk Factors:Hypertension and                 Dyslipidemia.  Sonographer:    Lucendia Herrlich Referring Phys: [id number] [facility] [place] IMPRESSIONS  1. Left ventricular ejection fraction, by estimation, is 45 to 50%. Left ventricular ejection fraction by 2D MOD biplane is 47.8 %. The left ventricle has mildly decreased function. The left ventricle demonstrates global hypokinesis. There is moderate asymmetric  left ventricular hypertrophy of the basal-septal segment. Left ventricular diastolic parameters are consistent with Grade I diastolic dysfunction (impaired relaxation).  2. Right ventricular systolic function is low normal. The right ventricular size is normal. There is normal pulmonary artery systolic pressure. The estimated right ventricular systolic pressure is 12.1 mmHg.  3. The mitral valve is abnormal. Trivial mitral valve regurgitation.  4. The aortic valve is grossly normal. Aortic valve regurgitation is trivial.  5. Aortic dilatation noted. There is moderate dilatation of the aortic root, measuring 45 mm. There is mild dilatation of the ascending aorta, measuring 44 mm.  6. The inferior vena cava is normal in size with greater than 50% respiratory variability, suggesting right atrial pressure of 3 mmHg. Comparison(s): Changes from prior study are noted. 01/16/2022: LVEF 35-40%. FINDINGS  Left Ventricle: Left ventricular ejection fraction, by estimation, is 45 to 50%. Left ventricular ejection fraction by 2D MOD biplane is 47.8 %. The left ventricle has mildly decreased function. The left ventricle demonstrates global hypokinesis. The left ventricular internal cavity size was normal in size. There is moderate asymmetric left ventricular hypertrophy of the basal-septal segment. Left ventricular diastolic parameters are consistent with Grade I diastolic dysfunction (impaired relaxation). Indeterminate filling pressures. Right Ventricle: The right ventricular size is normal. No increase in right ventricular wall thickness. Right ventricular systolic function is low normal. There is normal pulmonary artery systolic pressure. The tricuspid regurgitant velocity is 1.51 m/s,  and with an assumed right atrial pressure of 3 mmHg, the estimated right ventricular systolic pressure is 12.1 mmHg. Left Atrium: Left atrial size was normal in size. Right Atrium: Right atrial size was normal in size. Pericardium: There is no  evidence of pericardial effusion. Mitral Valve: The mitral valve is abnormal. Mild mitral annular calcification. Trivial mitral valve regurgitation. Tricuspid Valve: The tricuspid valve is grossly normal. Tricuspid valve  05/01/2023 1257   PROT 6.2 (L) 04/17/2023 0552   ALBUMIN 3.2 (L) 04/17/2023 0552   AST 23 04/17/2023 0552   ALT 14 04/17/2023 0552   ALKPHOS 52 04/17/2023 0552   BILITOT 1.1 04/17/2023 0552   GFRNONAA >60 05/01/2023 1257   COAGS Lab Results  Component Value Date   INR 1.1 04/16/2023   INR 1.1 02/11/2023   INR 1.1 01/15/2022   Lipid Panel    Component Value Date/Time   CHOL 186 04/17/2023 0600   TRIG 57 04/17/2023 0600   HDL 43 04/17/2023 0600   CHOLHDL 4.3 04/17/2023 0600   VLDL 11 04/17/2023 0600   LDLCALC 132 (H) 04/17/2023 0600   HgbA1C  Lab Results  Component Value Date   HGBA1C 5.2 04/16/2023   Urinalysis    Component Value Date/Time   COLORURINE AMBER (A) 02/11/2023 1037   APPEARANCEUR HAZY (A) 02/11/2023 1037   LABSPEC 1.024 02/11/2023 1037   PHURINE 5.0 02/11/2023 1037    GLUCOSEU NEGATIVE 02/11/2023 1037   HGBUR NEGATIVE 02/11/2023 1037   BILIRUBINUR NEGATIVE 02/11/2023 1037   KETONESUR 5 (A) 02/11/2023 1037   PROTEINUR NEGATIVE 02/11/2023 1037   NITRITE NEGATIVE 02/11/2023 1037   LEUKOCYTESUR NEGATIVE 02/11/2023 1037   Urine Drug Screen     Component Value Date/Time   LABOPIA NONE DETECTED 01/15/2022 1908   COCAINSCRNUR NONE DETECTED 01/15/2022 1908   LABBENZ NONE DETECTED 01/15/2022 1908   AMPHETMU NONE DETECTED 01/15/2022 1908   THCU NONE DETECTED 01/15/2022 1908   LABBARB NONE DETECTED 01/15/2022 1908    Alcohol Level    Component Value Date/Time   ETH <10 04/16/2023 1100     SIGNIFICANT DIAGNOSTIC STUDIES DG Abd Portable 1V  Result Date: 04/24/2023 CLINICAL DATA:  Feeding tube placement. EXAM: PORTABLE ABDOMEN - 1 VIEW COMPARISON:  04/19/2023 FINDINGS: Feeding tube tip in the region of the gastric pylorus or proximal duodenal bulb. Included bowel-gas pattern is normal. Enlarged cardiac silhouette. The included lungs are clear and overexposed. No acute bony abnormality. IMPRESSION: Feeding tube tip in the region of the gastric pylorus or proximal duodenal bulb. Electronically Signed   By: Beckie Salts M.D.   On: 04/24/2023 13:00   DG Swallowing Func-Speech Pathology  Result Date: 04/23/2023 Table formatting from the original result was not included. Modified Barium Swallow Study Patient Details Name: Casey Rangel MRN: [id number] Date of Birth: November 30, 1950 Today's Date: 04/23/2023 HPI/PMH: HPI: Casey Rangel is a 72 y.o. male who presents with right-sided weakness, left gaze deviation and global aphasia/mute on 04/16/23. MRI shows Multifocal acute ischemia within the left MCA territory. No  hemorrhage or mass effect. 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 Recommended dys2/thin on MBS in 2023. Pt also with recent BL small volume SDH in June 2024.  who presents with right-sided weakness, left gaze deviation and global aphasia/mute on 04/16/23. Clinical Impression: Clinical Impression: Pt presents with a primary oral dysphagia with right labial weakness with possible further oral deficits that are difficult to assess given pts global aphasia. Pt is attentive to PO and able to be fed with a spoon or self feed with a cup, though he needs assist to hold cup without spilling. There is mild anterior spillage on the right. Pt could not sip from a straw today. Pt had prolonged  05/01/2023 1257   PROT 6.2 (L) 04/17/2023 0552   ALBUMIN 3.2 (L) 04/17/2023 0552   AST 23 04/17/2023 0552   ALT 14 04/17/2023 0552   ALKPHOS 52 04/17/2023 0552   BILITOT 1.1 04/17/2023 0552   GFRNONAA >60 05/01/2023 1257   COAGS Lab Results  Component Value Date   INR 1.1 04/16/2023   INR 1.1 02/11/2023   INR 1.1 01/15/2022   Lipid Panel    Component Value Date/Time   CHOL 186 04/17/2023 0600   TRIG 57 04/17/2023 0600   HDL 43 04/17/2023 0600   CHOLHDL 4.3 04/17/2023 0600   VLDL 11 04/17/2023 0600   LDLCALC 132 (H) 04/17/2023 0600   HgbA1C  Lab Results  Component Value Date   HGBA1C 5.2 04/16/2023   Urinalysis    Component Value Date/Time   COLORURINE AMBER (A) 02/11/2023 1037   APPEARANCEUR HAZY (A) 02/11/2023 1037   LABSPEC 1.024 02/11/2023 1037   PHURINE 5.0 02/11/2023 1037    GLUCOSEU NEGATIVE 02/11/2023 1037   HGBUR NEGATIVE 02/11/2023 1037   BILIRUBINUR NEGATIVE 02/11/2023 1037   KETONESUR 5 (A) 02/11/2023 1037   PROTEINUR NEGATIVE 02/11/2023 1037   NITRITE NEGATIVE 02/11/2023 1037   LEUKOCYTESUR NEGATIVE 02/11/2023 1037   Urine Drug Screen     Component Value Date/Time   LABOPIA NONE DETECTED 01/15/2022 1908   COCAINSCRNUR NONE DETECTED 01/15/2022 1908   LABBENZ NONE DETECTED 01/15/2022 1908   AMPHETMU NONE DETECTED 01/15/2022 1908   THCU NONE DETECTED 01/15/2022 1908   LABBARB NONE DETECTED 01/15/2022 1908    Alcohol Level    Component Value Date/Time   ETH <10 04/16/2023 1100     SIGNIFICANT DIAGNOSTIC STUDIES DG Abd Portable 1V  Result Date: 04/24/2023 CLINICAL DATA:  Feeding tube placement. EXAM: PORTABLE ABDOMEN - 1 VIEW COMPARISON:  04/19/2023 FINDINGS: Feeding tube tip in the region of the gastric pylorus or proximal duodenal bulb. Included bowel-gas pattern is normal. Enlarged cardiac silhouette. The included lungs are clear and overexposed. No acute bony abnormality. IMPRESSION: Feeding tube tip in the region of the gastric pylorus or proximal duodenal bulb. Electronically Signed   By: Beckie Salts M.D.   On: 04/24/2023 13:00   DG Swallowing Func-Speech Pathology  Result Date: 04/23/2023 Table formatting from the original result was not included. Modified Barium Swallow Study Patient Details Name: Casey Rangel MRN: [id number] Date of Birth: November 30, 1950 Today's Date: 04/23/2023 HPI/PMH: HPI: Casey Rangel is a 72 y.o. male who presents with right-sided weakness, left gaze deviation and global aphasia/mute on 04/16/23. MRI shows Multifocal acute ischemia within the left MCA territory. No  hemorrhage or mass effect. 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 Recommended dys2/thin on MBS in 2023. Pt also with recent BL small volume SDH in June 2024.  who presents with right-sided weakness, left gaze deviation and global aphasia/mute on 04/16/23. Clinical Impression: Clinical Impression: Pt presents with a primary oral dysphagia with right labial weakness with possible further oral deficits that are difficult to assess given pts global aphasia. Pt is attentive to PO and able to be fed with a spoon or self feed with a cup, though he needs assist to hold cup without spilling. There is mild anterior spillage on the right. Pt could not sip from a straw today. Pt had prolonged  05/01/2023 1257   PROT 6.2 (L) 04/17/2023 0552   ALBUMIN 3.2 (L) 04/17/2023 0552   AST 23 04/17/2023 0552   ALT 14 04/17/2023 0552   ALKPHOS 52 04/17/2023 0552   BILITOT 1.1 04/17/2023 0552   GFRNONAA >60 05/01/2023 1257   COAGS Lab Results  Component Value Date   INR 1.1 04/16/2023   INR 1.1 02/11/2023   INR 1.1 01/15/2022   Lipid Panel    Component Value Date/Time   CHOL 186 04/17/2023 0600   TRIG 57 04/17/2023 0600   HDL 43 04/17/2023 0600   CHOLHDL 4.3 04/17/2023 0600   VLDL 11 04/17/2023 0600   LDLCALC 132 (H) 04/17/2023 0600   HgbA1C  Lab Results  Component Value Date   HGBA1C 5.2 04/16/2023   Urinalysis    Component Value Date/Time   COLORURINE AMBER (A) 02/11/2023 1037   APPEARANCEUR HAZY (A) 02/11/2023 1037   LABSPEC 1.024 02/11/2023 1037   PHURINE 5.0 02/11/2023 1037    GLUCOSEU NEGATIVE 02/11/2023 1037   HGBUR NEGATIVE 02/11/2023 1037   BILIRUBINUR NEGATIVE 02/11/2023 1037   KETONESUR 5 (A) 02/11/2023 1037   PROTEINUR NEGATIVE 02/11/2023 1037   NITRITE NEGATIVE 02/11/2023 1037   LEUKOCYTESUR NEGATIVE 02/11/2023 1037   Urine Drug Screen     Component Value Date/Time   LABOPIA NONE DETECTED 01/15/2022 1908   COCAINSCRNUR NONE DETECTED 01/15/2022 1908   LABBENZ NONE DETECTED 01/15/2022 1908   AMPHETMU NONE DETECTED 01/15/2022 1908   THCU NONE DETECTED 01/15/2022 1908   LABBARB NONE DETECTED 01/15/2022 1908    Alcohol Level    Component Value Date/Time   ETH <10 04/16/2023 1100     SIGNIFICANT DIAGNOSTIC STUDIES DG Abd Portable 1V  Result Date: 04/24/2023 CLINICAL DATA:  Feeding tube placement. EXAM: PORTABLE ABDOMEN - 1 VIEW COMPARISON:  04/19/2023 FINDINGS: Feeding tube tip in the region of the gastric pylorus or proximal duodenal bulb. Included bowel-gas pattern is normal. Enlarged cardiac silhouette. The included lungs are clear and overexposed. No acute bony abnormality. IMPRESSION: Feeding tube tip in the region of the gastric pylorus or proximal duodenal bulb. Electronically Signed   By: Beckie Salts M.D.   On: 04/24/2023 13:00   DG Swallowing Func-Speech Pathology  Result Date: 04/23/2023 Table formatting from the original result was not included. Modified Barium Swallow Study Patient Details Name: Casey Rangel MRN: [id number] Date of Birth: November 30, 1950 Today's Date: 04/23/2023 HPI/PMH: HPI: Casey Rangel is a 72 y.o. male who presents with right-sided weakness, left gaze deviation and global aphasia/mute on 04/16/23. MRI shows Multifocal acute ischemia within the left MCA territory. No  hemorrhage or mass effect. 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 Recommended dys2/thin on MBS in 2023. Pt also with recent BL small volume SDH in June 2024.  who presents with right-sided weakness, left gaze deviation and global aphasia/mute on 04/16/23. Clinical Impression: Clinical Impression: Pt presents with a primary oral dysphagia with right labial weakness with possible further oral deficits that are difficult to assess given pts global aphasia. Pt is attentive to PO and able to be fed with a spoon or self feed with a cup, though he needs assist to hold cup without spilling. There is mild anterior spillage on the right. Pt could not sip from a straw today. Pt had prolonged  05/01/2023 1257   PROT 6.2 (L) 04/17/2023 0552   ALBUMIN 3.2 (L) 04/17/2023 0552   AST 23 04/17/2023 0552   ALT 14 04/17/2023 0552   ALKPHOS 52 04/17/2023 0552   BILITOT 1.1 04/17/2023 0552   GFRNONAA >60 05/01/2023 1257   COAGS Lab Results  Component Value Date   INR 1.1 04/16/2023   INR 1.1 02/11/2023   INR 1.1 01/15/2022   Lipid Panel    Component Value Date/Time   CHOL 186 04/17/2023 0600   TRIG 57 04/17/2023 0600   HDL 43 04/17/2023 0600   CHOLHDL 4.3 04/17/2023 0600   VLDL 11 04/17/2023 0600   LDLCALC 132 (H) 04/17/2023 0600   HgbA1C  Lab Results  Component Value Date   HGBA1C 5.2 04/16/2023   Urinalysis    Component Value Date/Time   COLORURINE AMBER (A) 02/11/2023 1037   APPEARANCEUR HAZY (A) 02/11/2023 1037   LABSPEC 1.024 02/11/2023 1037   PHURINE 5.0 02/11/2023 1037    GLUCOSEU NEGATIVE 02/11/2023 1037   HGBUR NEGATIVE 02/11/2023 1037   BILIRUBINUR NEGATIVE 02/11/2023 1037   KETONESUR 5 (A) 02/11/2023 1037   PROTEINUR NEGATIVE 02/11/2023 1037   NITRITE NEGATIVE 02/11/2023 1037   LEUKOCYTESUR NEGATIVE 02/11/2023 1037   Urine Drug Screen     Component Value Date/Time   LABOPIA NONE DETECTED 01/15/2022 1908   COCAINSCRNUR NONE DETECTED 01/15/2022 1908   LABBENZ NONE DETECTED 01/15/2022 1908   AMPHETMU NONE DETECTED 01/15/2022 1908   THCU NONE DETECTED 01/15/2022 1908   LABBARB NONE DETECTED 01/15/2022 1908    Alcohol Level    Component Value Date/Time   ETH <10 04/16/2023 1100     SIGNIFICANT DIAGNOSTIC STUDIES DG Abd Portable 1V  Result Date: 04/24/2023 CLINICAL DATA:  Feeding tube placement. EXAM: PORTABLE ABDOMEN - 1 VIEW COMPARISON:  04/19/2023 FINDINGS: Feeding tube tip in the region of the gastric pylorus or proximal duodenal bulb. Included bowel-gas pattern is normal. Enlarged cardiac silhouette. The included lungs are clear and overexposed. No acute bony abnormality. IMPRESSION: Feeding tube tip in the region of the gastric pylorus or proximal duodenal bulb. Electronically Signed   By: Beckie Salts M.D.   On: 04/24/2023 13:00   DG Swallowing Func-Speech Pathology  Result Date: 04/23/2023 Table formatting from the original result was not included. Modified Barium Swallow Study Patient Details Name: Casey Rangel MRN: [id number] Date of Birth: November 30, 1950 Today's Date: 04/23/2023 HPI/PMH: HPI: Casey Rangel is a 72 y.o. male who presents with right-sided weakness, left gaze deviation and global aphasia/mute on 04/16/23. MRI shows Multifocal acute ischemia within the left MCA territory. No  hemorrhage or mass effect. 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 Recommended dys2/thin on MBS in 2023. Pt also with recent BL small volume SDH in June 2024.  who presents with right-sided weakness, left gaze deviation and global aphasia/mute on 04/16/23. Clinical Impression: Clinical Impression: Pt presents with a primary oral dysphagia with right labial weakness with possible further oral deficits that are difficult to assess given pts global aphasia. Pt is attentive to PO and able to be fed with a spoon or self feed with a cup, though he needs assist to hold cup without spilling. There is mild anterior spillage on the right. Pt could not sip from a straw today. Pt had prolonged  Signed   By: Richarda Overlie M.D.   On: 04/17/2023 13:25   ECHOCARDIOGRAM COMPLETE  Result Date: 04/17/2023    ECHOCARDIOGRAM REPORT   Patient Name:   Casey Rangel Date of Exam: 04/17/2023 Medical Rec #:  [id number]      Height:       70.0 in Accession #:    [id number]     Weight:       168.4 lb Date of Birth:  1950-10-10      BSA:          1.940 m Patient Age:    71 years       BP:           117/66 mmHg Patient Gender: M              HR:           78 bpm. Exam Location:  Inpatient Procedure: 2D Echo, Cardiac Doppler and Color Doppler Indications:    Stroke I63.9  History:        Patient has prior history of Echocardiogram examinations, most                 recent 01/16/2022. CHF, Stroke; Risk Factors:Hypertension and                 Dyslipidemia.  Sonographer:    Lucendia Herrlich Referring Phys: [id number] [facility] [place] IMPRESSIONS  1. Left ventricular ejection fraction, by estimation, is 45 to 50%. Left ventricular ejection fraction by 2D MOD biplane is 47.8 %. The left ventricle has mildly decreased function. The left ventricle demonstrates global hypokinesis. There is moderate asymmetric  left ventricular hypertrophy of the basal-septal segment. Left ventricular diastolic parameters are consistent with Grade I diastolic dysfunction (impaired relaxation).  2. Right ventricular systolic function is low normal. The right ventricular size is normal. There is normal pulmonary artery systolic pressure. The estimated right ventricular systolic pressure is 12.1 mmHg.  3. The mitral valve is abnormal. Trivial mitral valve regurgitation.  4. The aortic valve is grossly normal. Aortic valve regurgitation is trivial.  5. Aortic dilatation noted. There is moderate dilatation of the aortic root, measuring 45 mm. There is mild dilatation of the ascending aorta, measuring 44 mm.  6. The inferior vena cava is normal in size with greater than 50% respiratory variability, suggesting right atrial pressure of 3 mmHg. Comparison(s): Changes from prior study are noted. 01/16/2022: LVEF 35-40%. FINDINGS  Left Ventricle: Left ventricular ejection fraction, by estimation, is 45 to 50%. Left ventricular ejection fraction by 2D MOD biplane is 47.8 %. The left ventricle has mildly decreased function. The left ventricle demonstrates global hypokinesis. The left ventricular internal cavity size was normal in size. There is moderate asymmetric left ventricular hypertrophy of the basal-septal segment. Left ventricular diastolic parameters are consistent with Grade I diastolic dysfunction (impaired relaxation). Indeterminate filling pressures. Right Ventricle: The right ventricular size is normal. No increase in right ventricular wall thickness. Right ventricular systolic function is low normal. There is normal pulmonary artery systolic pressure. The tricuspid regurgitant velocity is 1.51 m/s,  and with an assumed right atrial pressure of 3 mmHg, the estimated right ventricular systolic pressure is 12.1 mmHg. Left Atrium: Left atrial size was normal in size. Right Atrium: Right atrial size was normal in size. Pericardium: There is no  evidence of pericardial effusion. Mitral Valve: The mitral valve is abnormal. Mild mitral annular calcification. Trivial mitral valve regurgitation. Tricuspid Valve: The tricuspid valve is grossly normal. Tricuspid valve  05/01/2023 1257   PROT 6.2 (L) 04/17/2023 0552   ALBUMIN 3.2 (L) 04/17/2023 0552   AST 23 04/17/2023 0552   ALT 14 04/17/2023 0552   ALKPHOS 52 04/17/2023 0552   BILITOT 1.1 04/17/2023 0552   GFRNONAA >60 05/01/2023 1257   COAGS Lab Results  Component Value Date   INR 1.1 04/16/2023   INR 1.1 02/11/2023   INR 1.1 01/15/2022   Lipid Panel    Component Value Date/Time   CHOL 186 04/17/2023 0600   TRIG 57 04/17/2023 0600   HDL 43 04/17/2023 0600   CHOLHDL 4.3 04/17/2023 0600   VLDL 11 04/17/2023 0600   LDLCALC 132 (H) 04/17/2023 0600   HgbA1C  Lab Results  Component Value Date   HGBA1C 5.2 04/16/2023   Urinalysis    Component Value Date/Time   COLORURINE AMBER (A) 02/11/2023 1037   APPEARANCEUR HAZY (A) 02/11/2023 1037   LABSPEC 1.024 02/11/2023 1037   PHURINE 5.0 02/11/2023 1037    GLUCOSEU NEGATIVE 02/11/2023 1037   HGBUR NEGATIVE 02/11/2023 1037   BILIRUBINUR NEGATIVE 02/11/2023 1037   KETONESUR 5 (A) 02/11/2023 1037   PROTEINUR NEGATIVE 02/11/2023 1037   NITRITE NEGATIVE 02/11/2023 1037   LEUKOCYTESUR NEGATIVE 02/11/2023 1037   Urine Drug Screen     Component Value Date/Time   LABOPIA NONE DETECTED 01/15/2022 1908   COCAINSCRNUR NONE DETECTED 01/15/2022 1908   LABBENZ NONE DETECTED 01/15/2022 1908   AMPHETMU NONE DETECTED 01/15/2022 1908   THCU NONE DETECTED 01/15/2022 1908   LABBARB NONE DETECTED 01/15/2022 1908    Alcohol Level    Component Value Date/Time   ETH <10 04/16/2023 1100     SIGNIFICANT DIAGNOSTIC STUDIES DG Abd Portable 1V  Result Date: 04/24/2023 CLINICAL DATA:  Feeding tube placement. EXAM: PORTABLE ABDOMEN - 1 VIEW COMPARISON:  04/19/2023 FINDINGS: Feeding tube tip in the region of the gastric pylorus or proximal duodenal bulb. Included bowel-gas pattern is normal. Enlarged cardiac silhouette. The included lungs are clear and overexposed. No acute bony abnormality. IMPRESSION: Feeding tube tip in the region of the gastric pylorus or proximal duodenal bulb. Electronically Signed   By: Beckie Salts M.D.   On: 04/24/2023 13:00   DG Swallowing Func-Speech Pathology  Result Date: 04/23/2023 Table formatting from the original result was not included. Modified Barium Swallow Study Patient Details Name: Casey Rangel MRN: [id number] Date of Birth: November 30, 1950 Today's Date: 04/23/2023 HPI/PMH: HPI: Casey Rangel is a 72 y.o. male who presents with right-sided weakness, left gaze deviation and global aphasia/mute on 04/16/23. MRI shows Multifocal acute ischemia within the left MCA territory. No  hemorrhage or mass effect. 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 Recommended dys2/thin on MBS in 2023. Pt also with recent BL small volume SDH in June 2024.  who presents with right-sided weakness, left gaze deviation and global aphasia/mute on 04/16/23. Clinical Impression: Clinical Impression: Pt presents with a primary oral dysphagia with right labial weakness with possible further oral deficits that are difficult to assess given pts global aphasia. Pt is attentive to PO and able to be fed with a spoon or self feed with a cup, though he needs assist to hold cup without spilling. There is mild anterior spillage on the right. Pt could not sip from a straw today. Pt had prolonged  05/01/2023 1257   PROT 6.2 (L) 04/17/2023 0552   ALBUMIN 3.2 (L) 04/17/2023 0552   AST 23 04/17/2023 0552   ALT 14 04/17/2023 0552   ALKPHOS 52 04/17/2023 0552   BILITOT 1.1 04/17/2023 0552   GFRNONAA >60 05/01/2023 1257   COAGS Lab Results  Component Value Date   INR 1.1 04/16/2023   INR 1.1 02/11/2023   INR 1.1 01/15/2022   Lipid Panel    Component Value Date/Time   CHOL 186 04/17/2023 0600   TRIG 57 04/17/2023 0600   HDL 43 04/17/2023 0600   CHOLHDL 4.3 04/17/2023 0600   VLDL 11 04/17/2023 0600   LDLCALC 132 (H) 04/17/2023 0600   HgbA1C  Lab Results  Component Value Date   HGBA1C 5.2 04/16/2023   Urinalysis    Component Value Date/Time   COLORURINE AMBER (A) 02/11/2023 1037   APPEARANCEUR HAZY (A) 02/11/2023 1037   LABSPEC 1.024 02/11/2023 1037   PHURINE 5.0 02/11/2023 1037    GLUCOSEU NEGATIVE 02/11/2023 1037   HGBUR NEGATIVE 02/11/2023 1037   BILIRUBINUR NEGATIVE 02/11/2023 1037   KETONESUR 5 (A) 02/11/2023 1037   PROTEINUR NEGATIVE 02/11/2023 1037   NITRITE NEGATIVE 02/11/2023 1037   LEUKOCYTESUR NEGATIVE 02/11/2023 1037   Urine Drug Screen     Component Value Date/Time   LABOPIA NONE DETECTED 01/15/2022 1908   COCAINSCRNUR NONE DETECTED 01/15/2022 1908   LABBENZ NONE DETECTED 01/15/2022 1908   AMPHETMU NONE DETECTED 01/15/2022 1908   THCU NONE DETECTED 01/15/2022 1908   LABBARB NONE DETECTED 01/15/2022 1908    Alcohol Level    Component Value Date/Time   ETH <10 04/16/2023 1100     SIGNIFICANT DIAGNOSTIC STUDIES DG Abd Portable 1V  Result Date: 04/24/2023 CLINICAL DATA:  Feeding tube placement. EXAM: PORTABLE ABDOMEN - 1 VIEW COMPARISON:  04/19/2023 FINDINGS: Feeding tube tip in the region of the gastric pylorus or proximal duodenal bulb. Included bowel-gas pattern is normal. Enlarged cardiac silhouette. The included lungs are clear and overexposed. No acute bony abnormality. IMPRESSION: Feeding tube tip in the region of the gastric pylorus or proximal duodenal bulb. Electronically Signed   By: Beckie Salts M.D.   On: 04/24/2023 13:00   DG Swallowing Func-Speech Pathology  Result Date: 04/23/2023 Table formatting from the original result was not included. Modified Barium Swallow Study Patient Details Name: Casey Rangel MRN: [id number] Date of Birth: November 30, 1950 Today's Date: 04/23/2023 HPI/PMH: HPI: Casey Rangel is a 72 y.o. male who presents with right-sided weakness, left gaze deviation and global aphasia/mute on 04/16/23. MRI shows Multifocal acute ischemia within the left MCA territory. No  hemorrhage or mass effect. 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 Recommended dys2/thin on MBS in 2023. Pt also with recent BL small volume SDH in June 2024.  who presents with right-sided weakness, left gaze deviation and global aphasia/mute on 04/16/23. Clinical Impression: Clinical Impression: Pt presents with a primary oral dysphagia with right labial weakness with possible further oral deficits that are difficult to assess given pts global aphasia. Pt is attentive to PO and able to be fed with a spoon or self feed with a cup, though he needs assist to hold cup without spilling. There is mild anterior spillage on the right. Pt could not sip from a straw today. Pt had prolonged  05/01/2023 1257   PROT 6.2 (L) 04/17/2023 0552   ALBUMIN 3.2 (L) 04/17/2023 0552   AST 23 04/17/2023 0552   ALT 14 04/17/2023 0552   ALKPHOS 52 04/17/2023 0552   BILITOT 1.1 04/17/2023 0552   GFRNONAA >60 05/01/2023 1257   COAGS Lab Results  Component Value Date   INR 1.1 04/16/2023   INR 1.1 02/11/2023   INR 1.1 01/15/2022   Lipid Panel    Component Value Date/Time   CHOL 186 04/17/2023 0600   TRIG 57 04/17/2023 0600   HDL 43 04/17/2023 0600   CHOLHDL 4.3 04/17/2023 0600   VLDL 11 04/17/2023 0600   LDLCALC 132 (H) 04/17/2023 0600   HgbA1C  Lab Results  Component Value Date   HGBA1C 5.2 04/16/2023   Urinalysis    Component Value Date/Time   COLORURINE AMBER (A) 02/11/2023 1037   APPEARANCEUR HAZY (A) 02/11/2023 1037   LABSPEC 1.024 02/11/2023 1037   PHURINE 5.0 02/11/2023 1037    GLUCOSEU NEGATIVE 02/11/2023 1037   HGBUR NEGATIVE 02/11/2023 1037   BILIRUBINUR NEGATIVE 02/11/2023 1037   KETONESUR 5 (A) 02/11/2023 1037   PROTEINUR NEGATIVE 02/11/2023 1037   NITRITE NEGATIVE 02/11/2023 1037   LEUKOCYTESUR NEGATIVE 02/11/2023 1037   Urine Drug Screen     Component Value Date/Time   LABOPIA NONE DETECTED 01/15/2022 1908   COCAINSCRNUR NONE DETECTED 01/15/2022 1908   LABBENZ NONE DETECTED 01/15/2022 1908   AMPHETMU NONE DETECTED 01/15/2022 1908   THCU NONE DETECTED 01/15/2022 1908   LABBARB NONE DETECTED 01/15/2022 1908    Alcohol Level    Component Value Date/Time   ETH <10 04/16/2023 1100     SIGNIFICANT DIAGNOSTIC STUDIES DG Abd Portable 1V  Result Date: 04/24/2023 CLINICAL DATA:  Feeding tube placement. EXAM: PORTABLE ABDOMEN - 1 VIEW COMPARISON:  04/19/2023 FINDINGS: Feeding tube tip in the region of the gastric pylorus or proximal duodenal bulb. Included bowel-gas pattern is normal. Enlarged cardiac silhouette. The included lungs are clear and overexposed. No acute bony abnormality. IMPRESSION: Feeding tube tip in the region of the gastric pylorus or proximal duodenal bulb. Electronically Signed   By: Beckie Salts M.D.   On: 04/24/2023 13:00   DG Swallowing Func-Speech Pathology  Result Date: 04/23/2023 Table formatting from the original result was not included. Modified Barium Swallow Study Patient Details Name: Casey Rangel MRN: [id number] Date of Birth: November 30, 1950 Today's Date: 04/23/2023 HPI/PMH: HPI: Casey Rangel is a 72 y.o. male who presents with right-sided weakness, left gaze deviation and global aphasia/mute on 04/16/23. MRI shows Multifocal acute ischemia within the left MCA territory. No  hemorrhage or mass effect. 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 Recommended dys2/thin on MBS in 2023. Pt also with recent BL small volume SDH in June 2024.  who presents with right-sided weakness, left gaze deviation and global aphasia/mute on 04/16/23. Clinical Impression: Clinical Impression: Pt presents with a primary oral dysphagia with right labial weakness with possible further oral deficits that are difficult to assess given pts global aphasia. Pt is attentive to PO and able to be fed with a spoon or self feed with a cup, though he needs assist to hold cup without spilling. There is mild anterior spillage on the right. Pt could not sip from a straw today. Pt had prolonged  05/01/2023 1257   PROT 6.2 (L) 04/17/2023 0552   ALBUMIN 3.2 (L) 04/17/2023 0552   AST 23 04/17/2023 0552   ALT 14 04/17/2023 0552   ALKPHOS 52 04/17/2023 0552   BILITOT 1.1 04/17/2023 0552   GFRNONAA >60 05/01/2023 1257   COAGS Lab Results  Component Value Date   INR 1.1 04/16/2023   INR 1.1 02/11/2023   INR 1.1 01/15/2022   Lipid Panel    Component Value Date/Time   CHOL 186 04/17/2023 0600   TRIG 57 04/17/2023 0600   HDL 43 04/17/2023 0600   CHOLHDL 4.3 04/17/2023 0600   VLDL 11 04/17/2023 0600   LDLCALC 132 (H) 04/17/2023 0600   HgbA1C  Lab Results  Component Value Date   HGBA1C 5.2 04/16/2023   Urinalysis    Component Value Date/Time   COLORURINE AMBER (A) 02/11/2023 1037   APPEARANCEUR HAZY (A) 02/11/2023 1037   LABSPEC 1.024 02/11/2023 1037   PHURINE 5.0 02/11/2023 1037    GLUCOSEU NEGATIVE 02/11/2023 1037   HGBUR NEGATIVE 02/11/2023 1037   BILIRUBINUR NEGATIVE 02/11/2023 1037   KETONESUR 5 (A) 02/11/2023 1037   PROTEINUR NEGATIVE 02/11/2023 1037   NITRITE NEGATIVE 02/11/2023 1037   LEUKOCYTESUR NEGATIVE 02/11/2023 1037   Urine Drug Screen     Component Value Date/Time   LABOPIA NONE DETECTED 01/15/2022 1908   COCAINSCRNUR NONE DETECTED 01/15/2022 1908   LABBENZ NONE DETECTED 01/15/2022 1908   AMPHETMU NONE DETECTED 01/15/2022 1908   THCU NONE DETECTED 01/15/2022 1908   LABBARB NONE DETECTED 01/15/2022 1908    Alcohol Level    Component Value Date/Time   ETH <10 04/16/2023 1100     SIGNIFICANT DIAGNOSTIC STUDIES DG Abd Portable 1V  Result Date: 04/24/2023 CLINICAL DATA:  Feeding tube placement. EXAM: PORTABLE ABDOMEN - 1 VIEW COMPARISON:  04/19/2023 FINDINGS: Feeding tube tip in the region of the gastric pylorus or proximal duodenal bulb. Included bowel-gas pattern is normal. Enlarged cardiac silhouette. The included lungs are clear and overexposed. No acute bony abnormality. IMPRESSION: Feeding tube tip in the region of the gastric pylorus or proximal duodenal bulb. Electronically Signed   By: Beckie Salts M.D.   On: 04/24/2023 13:00   DG Swallowing Func-Speech Pathology  Result Date: 04/23/2023 Table formatting from the original result was not included. Modified Barium Swallow Study Patient Details Name: Casey Rangel MRN: [id number] Date of Birth: November 30, 1950 Today's Date: 04/23/2023 HPI/PMH: HPI: Casey Rangel is a 72 y.o. male who presents with right-sided weakness, left gaze deviation and global aphasia/mute on 04/16/23. MRI shows Multifocal acute ischemia within the left MCA territory. No  hemorrhage or mass effect. 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 Recommended dys2/thin on MBS in 2023. Pt also with recent BL small volume SDH in June 2024.  who presents with right-sided weakness, left gaze deviation and global aphasia/mute on 04/16/23. Clinical Impression: Clinical Impression: Pt presents with a primary oral dysphagia with right labial weakness with possible further oral deficits that are difficult to assess given pts global aphasia. Pt is attentive to PO and able to be fed with a spoon or self feed with a cup, though he needs assist to hold cup without spilling. There is mild anterior spillage on the right. Pt could not sip from a straw today. Pt had prolonged

## 2023-04-26 NOTE — Plan of Care (Signed)
  Problem: Education: Goal: Knowledge of disease or condition will improve Outcome: Progressing   Problem: Ischemic Stroke/TIA Tissue Perfusion: Goal: Complications of ischemic stroke/TIA will be minimized Outcome: Progressing   Problem: Health Behavior/Discharge Planning: Goal: Goals will be collaboratively established with patient/family Outcome: Progressing   Problem: Nutrition: Goal: Risk of aspiration will decrease Outcome: Progressing Goal: Dietary intake will improve Outcome: Progressing   Problem: Nutrition: Goal: Adequate nutrition will be maintained Outcome: Progressing   Problem: Safety: Goal: Non-violent Restraint(s) Outcome: Completed/Met

## 2023-04-26 NOTE — Progress Notes (Addendum)
STROKE TEAM PROGRESS NOTE   BRIEF HPI Casey Rangel is a 72 y.o. 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 Underwent mechanic thrombectomy of L MCA M1 (TICI 3) complicated by contrast stain in posterior L putamen.   SIGNIFICANT HOSPITAL EVENTS 8/29: mechanic thrombectomy of L MCA M1 (TICI 3) requiring rescue left MCA angioplasty and stenting complicated by contrast stain L sylvian fissure 8/31 - pulled cortrak tube -> NG inserted 9/1 - pulled NG tube -> NG reinserted -> pulled 9/2: speech eval, able to take meds crushed with applesauce  INTERIM HISTORY/SUBJECTIVE No family at bedside.  Per RN patients sister is likely not coming up to the hospital today as planned for a meeting.  Will start megace for an appetite stimulant and monitor how he does.  K is 3.3 will replace today  Continue Seroquel 12.5 mg BID Neurological exam is unchanged and stable.   OBJECTIVE  CBC    Component Value Date/Time   WBC 9.3 04/26/2023 0942   RBC 5.28 04/26/2023 0942   HGB 15.9 04/26/2023 0942   HCT 46.1 04/26/2023 0942   PLT 357 04/26/2023 0942   MCV 87.3 04/26/2023 0942   MCH 30.1 04/26/2023 0942   MCHC 34.5 04/26/2023 0942   RDW 13.1 04/26/2023 0942   LYMPHSABS 1.0 04/17/2023 0552   MONOABS 0.8 04/17/2023 0552   EOSABS 0.0 04/17/2023 0552   BASOSABS 0.0 04/17/2023 0552    BMET    Component Value Date/Time   NA 137 04/26/2023 0942   K 3.3 (L) 04/26/2023 0942   CL 106 04/26/2023 0942   CO2 22 04/26/2023 0942   GLUCOSE 109 (H) 04/26/2023 0942   BUN 14 04/26/2023 0942   CREATININE 0.86 04/26/2023 0942   CALCIUM 8.6 (L) 04/26/2023 0942   GFRNONAA >60 04/26/2023 0942   IMAGING past 24 hours No results found.  Vitals:   04/26/23 0001 04/26/23 0351 04/26/23 0500 04/26/23 0819  BP: 125/87 (!)  143/98  (!) 150/69  Pulse: 92 60  (!) 56  Resp: 18 16  18   Temp: 98.4 F (36.9 C) 97.8 F (36.6 C)  98 F (36.7 C)  TempSrc: Oral Oral  Oral  SpO2: 100% 100%  100%  Weight:   69.8 kg   Height:        PHYSICAL EXAM General: intubated CV: Regular rate and rhythm on monitor Respiratory:  synchronous with ventilator GI: Abdomen soft and nontender  NEURO:  Neuro -, eyes open, global aphasia, nonverbal and not following verbal commands, not able to pantomime, not able to repeat or name. Left gaze preference but able to have right gaze, blinking to visual threat on the left but not on right. Right facial droop. Tongue protrusion not cooperative.  LUE 4+/5 and RUE 4/5 with slight pronator drift. LLE 3/5 and RLE 3/5, but withdraw less to pain on the right than left.  Sensation, coordination and gait not tested.   ASSESSMENT/PLAN  Acute Ischemic Infarct:  left MCA scattered infarcts due to left M2 MCA occlusion s/p IR with TICI3 and left M2 stenting, etiology likely large vessel disease  Code Stroke CT head Acute L MCA territory infarct involving L insula. Small vessel disease. ASPECTS 9.   CTA head & neck Acute occlusion of a proximal left M2, left M1 moderate stenosis, right ICA bulb ulcerated  plaque, bilateral ICA bulb atherosclerosis. CT perfusion 45/91 S/p IR with TICI3 and left M2 stenting CT head s/p stent L MCA, Hyperdense material in the left Sylvian fissure, likely extravasated contrast. MRI  Multifocal acute ischemia within the left MCA territory. No hemorrhage or mass effect. 2D Echo EF 45-50%. Global hypokinesis of LV LDL 132 HgbA1c 5.2 VTE prophylaxis - lovenox 40 aspirin 81 mg daily prior to admission, now on aspirin 81 mg daily and Brilinta (ticagrelor) 90 mg bid for stent.   Therapy recommendations:  SNF Disposition:  pending  Hx of Stroke/TIA 01/2022 admitted for stroke. CT showed old left parietal occipital, right PCA and right MCA/ACA infarcts.  CTA head and neck  left ICA terminus string sign and possible occlusion.  CTP positive penumbra.  Status post IR with TICI3. MRI showed left patchy MCA infarct. MRA showed left ICA terminus patent but left M1 moderate stenosis.  EF 35 to 40%, LDL 129, A1c 5.1.  Patient discharged on DAPT and Crestor 20. 30-day cardiac event monitoring as outpatient showed no active but only has 7 days of data.  Hypertension HFrEF Home meds:  coreg 3.125 twice daily, entresto 24-26 twice daily Stable On Coreg 3.125 Long-term BP goal normotensive  Hyperlipidemia Home meds:  none LDL 132, goal < 70 On crestor 20 Continue statin at discharge  Agitation  Start seroquel 12.5 mg  Check EKG   Dysphagia Currently on dysphagia 1 diet with honey thick liquids Able to take medications with applesauce Pulled off cortrak-> NG placed -> pulled off NG -> NG re-inserted->pulled off again Holding off on coretrak as pt passed swallow  Coretrak placed 9/6. Pulled out again last night  Sister [name] does not want PEG tube placed   Other Stroke Risk Factors ETOH use, alcohol level <10, advised to drink no more than 2 drink(s) a day Congestive heart failure Former smoker  Other Active Problems Leukocytosis, improved: WBC 11.8--11.1--10.8-9.8 -> 7.6 AKI, creatinine 1.22--1.04--0.99-1.05 -> 0.81 Hypokalemia K 3.3 will replace   Hospital day # 10   Pt seen by Neuro NP/APP and later by MD. Note/plan to be edited by MD as needed.    Gevena Mart DNP, ACNPC-AG  [facility] I have personally obtained history,examined this patient, reviewed notes, independently viewed imaging studies, participated in medical decision making and plan of care.ROS completed by me personally and pertinent positives fully documented  I have made any additions or clarifications directly to the above note. Agree with note above.  Patient's family cannot make it today for family meeting.  Patient should be encouraged to increase oral intake.  Will  have goals of care discussion with family.  Delia Heady, MD Medical Director [facility] Pager: [phone] 04/26/2023 3:13 PM    To contact Stroke Continuity provider, please refer to [email or web address]. After hours, contact General Neurology

## 2023-04-27 LAB — GLUCOSE, CAPILLARY
Glucose-Capillary: 108 mg/dL — ABNORMAL HIGH (ref 70–99)
Glucose-Capillary: 110 mg/dL — ABNORMAL HIGH (ref 70–99)
Glucose-Capillary: 111 mg/dL — ABNORMAL HIGH (ref 70–99)
Glucose-Capillary: 118 mg/dL — ABNORMAL HIGH (ref 70–99)
Glucose-Capillary: 118 mg/dL — ABNORMAL HIGH (ref 70–99)
Glucose-Capillary: 97 mg/dL (ref 70–99)

## 2023-04-27 LAB — CBC
HCT: 44.6 % (ref 39.0–52.0)
Hemoglobin: 15.4 g/dL (ref 13.0–17.0)
MCH: 31.4 pg (ref 26.0–34.0)
MCHC: 34.5 g/dL (ref 30.0–36.0)
MCV: 91 fL (ref 80.0–100.0)
Platelets: 341 10*3/uL (ref 150–400)
RBC: 4.9 MIL/uL (ref 4.22–5.81)
RDW: 13.3 % (ref 11.5–15.5)
WBC: 9.3 10*3/uL (ref 4.0–10.5)
nRBC: 0 % (ref 0.0–0.2)

## 2023-04-27 LAB — BASIC METABOLIC PANEL
Anion gap: 7 (ref 5–15)
BUN: 20 mg/dL (ref 8–23)
CO2: 22 mmol/L (ref 22–32)
Calcium: 8.4 mg/dL — ABNORMAL LOW (ref 8.9–10.3)
Chloride: 110 mmol/L (ref 98–111)
Creatinine, Ser: 0.99 mg/dL (ref 0.61–1.24)
GFR, Estimated: >60 mL/min (ref >60)
Glucose, Bld: 99 mg/dL (ref 70–99)
Potassium: 3.5 mmol/L (ref 3.5–5.1)
Sodium: 139 mmol/L (ref 135–145)

## 2023-04-27 LAB — PHOSPHORUS: Phosphorus: 3.7 mg/dL (ref 2.5–4.6)

## 2023-04-27 LAB — MAGNESIUM: Magnesium: 2.2 mg/dL (ref 1.7–2.4)

## 2023-04-27 MED ORDER — THIAMINE MONONITRATE 100 MG PO TABS
100.0000 mg | ORAL_TABLET | Freq: Every day | ORAL | Status: DC
  Administered 2023-04-28 – 2023-04-29 (×2): 100 mg via ORAL
  Filled 2023-04-27 (×2): qty 1

## 2023-04-27 NOTE — Plan of Care (Signed)
  Problem: Education: Goal: Knowledge of disease or condition will improve Outcome: Progressing Goal: Knowledge of secondary prevention will improve (MUST DOCUMENT ALL) Outcome: Progressing Goal: Knowledge of patient specific risk factors will improve Loraine Leriche N/A or DELETE if not current risk factor) Outcome: Progressing   Problem: Ischemic Stroke/TIA Tissue Perfusion: Goal: Complications of ischemic stroke/TIA will be minimized Outcome: Progressing   Problem: Coping: Goal: Will verbalize positive feelings about self Outcome: Progressing Goal: Will identify appropriate support needs Outcome: Progressing   Problem: Health Behavior/Discharge Planning: Goal: Ability to manage health-related needs will improve Outcome: Progressing Goal: Goals will be collaboratively established with patient/family Outcome: Progressing   Problem: Self-Care: Goal: Ability to participate in self-care as condition permits will improve Outcome: Progressing Goal: Verbalization of feelings and concerns over difficulty with self-care will improve Outcome: Progressing Goal: Ability to communicate needs accurately will improve Outcome: Progressing   Problem: Nutrition: Goal: Risk of aspiration will decrease Outcome: Progressing Goal: Dietary intake will improve Outcome: Progressing   Problem: Education: Goal: Understanding of CV disease, CV risk reduction, and recovery process will improve Outcome: Progressing Goal: Individualized Educational Video(s) Outcome: Progressing   Problem: Activity: Goal: Ability to return to baseline activity level will improve Outcome: Progressing   Problem: Cardiovascular: Goal: Ability to achieve and maintain adequate cardiovascular perfusion will improve Outcome: Progressing Goal: Vascular access site(s) Level 0-1 will be maintained Outcome: Progressing   Problem: Health Behavior/Discharge Planning: Goal: Ability to safely manage health-related needs after  discharge will improve Outcome: Progressing   Problem: Education: Goal: Knowledge of General Education information will improve Description: Including pain rating scale, medication(s)/side effects and non-pharmacologic comfort measures Outcome: Progressing   Problem: Health Behavior/Discharge Planning: Goal: Ability to manage health-related needs will improve Outcome: Progressing   Problem: Clinical Measurements: Goal: Ability to maintain clinical measurements within normal limits will improve Outcome: Progressing Goal: Will remain free from infection Outcome: Progressing Goal: Diagnostic test results will improve Outcome: Progressing Goal: Respiratory complications will improve 04/27/2023 1827 by Salley Hews, RN Outcome: Progressing 04/27/2023 1826 by Salley Hews, RN Outcome: Progressing Goal: Cardiovascular complication will be avoided Outcome: Progressing   Problem: Activity: Goal: Risk for activity intolerance will decrease 04/27/2023 1827 by Salley Hews, RN Outcome: Progressing 04/27/2023 1826 by Salley Hews, RN Outcome: Progressing   Problem: Nutrition: Goal: Adequate nutrition will be maintained 04/27/2023 1827 by Salley Hews, RN Outcome: Progressing 04/27/2023 1826 by Salley Hews, RN Outcome: Progressing   Problem: Coping: Goal: Level of anxiety will decrease Outcome: Progressing   Problem: Elimination: Goal: Will not experience complications related to bowel motility 04/27/2023 1827 by Salley Hews, RN Outcome: Progressing 04/27/2023 1826 by Salley Hews, RN Outcome: Progressing Goal: Will not experience complications related to urinary retention Outcome: Progressing   Problem: Pain Managment: Goal: General experience of comfort will improve 04/27/2023 1827 by Salley Hews, RN Outcome: Progressing 04/27/2023 1826 by Salley Hews, RN Outcome:  Progressing   Problem: Safety: Goal: Ability to remain free from injury will improve Outcome: Progressing   Problem: Skin Integrity: Goal: Risk for impaired skin integrity will decrease Outcome: Progressing

## 2023-04-27 NOTE — Progress Notes (Addendum)
STROKE TEAM PROGRESS NOTE   BRIEF HPI Casey Rangel is a 72 y.o. 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 Underwent mechanic thrombectomy of L MCA M1 (TICI 3) complicated by contrast stain in posterior L putamen.   SIGNIFICANT HOSPITAL EVENTS 8/29: mechanic thrombectomy of L MCA M1 (TICI 3) requiring rescue left MCA angioplasty and stenting complicated by contrast stain L sylvian fissure 8/31 - pulled cortrak tube -> NG inserted 9/1 - pulled NG tube -> NG reinserted -> pulled 9/2: speech eval, able to take meds crushed with applesauce 9/8: Seroquel and Megace started. Unable to have planned family meeting.  INTERIM HISTORY/SUBJECTIVE No family at bedside. Further GOC discussion/meeting need with family.  Neurological exam is unchanged and stable.   OBJECTIVE  CBC    Component Value Date/Time   WBC 9.3 04/27/2023 0728   RBC 4.90 04/27/2023 0728   HGB 15.4 04/27/2023 0728   HCT 44.6 04/27/2023 0728   PLT 341 04/27/2023 0728   MCV 91.0 04/27/2023 0728   MCH 31.4 04/27/2023 0728   MCHC 34.5 04/27/2023 0728   RDW 13.3 04/27/2023 0728   LYMPHSABS 1.0 04/17/2023 0552   MONOABS 0.8 04/17/2023 0552   EOSABS 0.0 04/17/2023 0552   BASOSABS 0.0 04/17/2023 0552    BMET    Component Value Date/Time   NA 139 04/27/2023 0728   K 3.5 04/27/2023 0728   CL 110 04/27/2023 0728   CO2 22 04/27/2023 0728   GLUCOSE 99 04/27/2023 0728   BUN 20 04/27/2023 0728   CREATININE 0.99 04/27/2023 0728   CALCIUM 8.4 (L) 04/27/2023 0728   GFRNONAA >60 04/27/2023 0728   IMAGING past 24 hours No results found.  Vitals:   04/26/23 2332 04/27/23 0415 04/27/23 0600 04/27/23 0735  BP: 120/73 (!) 128/92  138/69  Pulse: 89 79  62  Resp: 19 16  18   Temp: 98 F (36.7 C) 98 F (36.7 C)  97.6 F (36.4 C)   TempSrc: Oral Oral  Oral  SpO2: (!) 86% 100%  100%  Weight:   68.4 kg   Height:        PHYSICAL EXAM General: intubated CV: Regular rate and rhythm on monitor Respiratory:  synchronous with ventilator GI: Abdomen soft and nontender  NEURO:  Neuro -, eyes open, global aphasia, nonverbal and not following verbal commands, not able to pantomime, not able to repeat or name. Left gaze preference but able to have right gaze, blinking to visual threat on the left but not on right. Right facial droop. Tongue protrusion not cooperative.  LUE 4+/5 and RUE 4/5 with slight pronator drift. LLE 3/5 and RLE 3/5, but withdraw less to pain on the right than left.  Sensation, coordination and gait not tested.   ASSESSMENT/PLAN  Acute Ischemic Infarct:  left MCA scattered infarcts due to left M2 MCA occlusion s/p IR with TICI3 and left M2 stenting, etiology likely large vessel disease  Code Stroke CT head Acute L MCA territory infarct involving L insula. Small vessel disease. ASPECTS 9.   CTA head & neck Acute occlusion of a proximal left M2, left M1 moderate stenosis, right ICA bulb ulcerated plaque, bilateral ICA bulb atherosclerosis. CT perfusion 45/91 S/p IR with TICI3 and left M2 stenting CT head s/p stent L MCA, Hyperdense material in the left Sylvian fissure, likely  extravasated contrast. MRI  Multifocal acute ischemia within the left MCA territory. No hemorrhage or mass effect. 2D Echo EF 45-50%. Global hypokinesis of LV LDL 132 HgbA1c 5.2 VTE prophylaxis - lovenox 40 aspirin 81 mg daily prior to admission, now on aspirin 81 mg daily and Brilinta (ticagrelor) 90 mg bid for stent.   Therapy recommendations:  SNF Disposition:  pending  Hx of Stroke/TIA 01/2022 admitted for stroke. CT showed old left parietal occipital, right PCA and right MCA/ACA infarcts.  CTA head and neck left ICA terminus string sign and possible occlusion.  CTP positive penumbra.  Status post IR with TICI3. MRI showed  left patchy MCA infarct. MRA showed left ICA terminus patent but left M1 moderate stenosis.  EF 35 to 40%, LDL 129, A1c 5.1.  Patient discharged on DAPT and Crestor 20. 30-day cardiac event monitoring as outpatient showed no active but only has 7 days of data.  Hypertension HFrEF Home meds:  coreg 3.125 twice daily, entresto 24-26 twice daily Stable On Coreg 3.125 Long-term BP goal normotensive  Hyperlipidemia Home meds:  none LDL 132, goal < 70 On crestor 20 Continue statin at discharge  Agitation  Start seroquel 12.5 mg   Dysphagia Currently on dysphagia 1 diet with honey thick liquids Able to take medications with applesauce Pulled off cortrak-> NG placed -> pulled off NG -> NG re-inserted->pulled off again Holding off on coretrak as pt passed swallow  Coretrak placed 9/6. Pulled out again 9/7. Sister [name] does not want PEG tube placed   Other Stroke Risk Factors ETOH use, alcohol level <10, advised to drink no more than 2 drink(s) a day Congestive heart failure Former smoker  Other Active Problems Leukocytosis, improved: WBC 11.8--11.1--10.8-9.8 -> 7.6 AKI, creatinine 1.22--1.04--0.99-1.05 -> 0.81 Hypokalemia K 3.3 will replace   Hospital day # 11   Pt seen by Neuro NP/APP and later by MD. Note/plan to be edited by MD as needed.    Lynnae January, DNP, AGACNP-BC [facility] Please use AMION for contact information & EPIC for messaging.  I have personally obtained history,examined this patient, reviewed notes, independently viewed imaging studies, participated in medical decision making and plan of care.ROS completed by me personally and pertinent positives fully documented  I have made any additions or clarifications directly to the above note. Agree with note above.  Patient's swallowing ability has declined even further as per speech therapy.  Patient will likely need PEG tube and prolonged nursing home care.  Need goals of care discussion with  family.  Daughter was supposed to arrive today but has not postponed the meeting.  Delia Heady, MD Medical Director [facility] Pager: [phone] 04/27/2023 3:20 PM    To contact Stroke Continuity provider, please refer to [email or web address]. After hours, contact General Neurology

## 2023-04-27 NOTE — Progress Notes (Signed)
Speech Language Pathology Treatment: Dysphagia  Patient Details Name: Casey Rangel MRN: [id number] DOB: 12-02-1950 Today's Date: 04/27/2023 Time: 4098-1191 SLP Time Calculation (min) (ACUTE ONLY): 25 min  Assessment / Plan / Recommendation Clinical Impression  Pt with poor tolerance of diet today. He was awake, but not as responsive. He didn't resist, but also didn't initiate. There was severe oral holding and residue. The added semi solids may have complicated tolerance but pt also holding puree and nectar today. Will downgrade to puree/nectar and intermittently pt is able to take some PO, but overall pts ability to thrive is poor. Discussed with Dr. Pearlean Brownie that pt would benefit from palliative care intervention with family given very poor QOL and inability to nutritionally support himself.   HPI HPI: Casey Rangel is a 72 y.o. male who presents with right-sided weakness, left gaze deviation and global aphasia/mute on 04/16/23. MRI shows Multifocal acute ischemia within the left MCA territory. No hemorrhage or mass effect. 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 Recommended dys2/thin on MBS in 2023. Pt also with recent BL small volume SDH in June 2024. who presents with right-sided weakness, left gaze deviation and global aphasia/mute on 04/16/23;MBS 9/5 rec'd D2/thin with potential progression to D3/thin;ST f/u for diet tolerance/dysphagia tx.      SLP Plan  Continue with current plan of care      Recommendations for follow up therapy are one component of a multi-disciplinary discharge planning process, led by the attending physician.  Recommendations may be updated based on patient status, additional functional criteria and insurance authorization.    Recommendations  Diet  recommendations: Dysphagia 1 (puree);Nectar-thick liquid Liquids provided via: Teaspoon;Cup Medication Administration: Crushed with puree Supervision: Staff to assist with self feeding;Full supervision/cueing for compensatory strategies Compensations: Minimize environmental distractions;Slow rate;Small sips/bites Postural Changes and/or Swallow Maneuvers: Seated upright 90 degrees                  Oral care before and after PO   Frequent or constant Supervision/Assistance Dysphagia, oropharyngeal phase (R13.12)     Continue with current plan of care     Casey Rangel, Casey Rangel  04/27/2023, 10:28 AM

## 2023-04-27 NOTE — Plan of Care (Signed)
  Problem: Education: Goal: Knowledge of disease or condition will improve Outcome: Not Progressing   

## 2023-04-28 LAB — GLUCOSE, CAPILLARY
Glucose-Capillary: 108 mg/dL — ABNORMAL HIGH (ref 70–99)
Glucose-Capillary: 65 mg/dL — ABNORMAL LOW (ref 70–99)
Glucose-Capillary: 81 mg/dL (ref 70–99)
Glucose-Capillary: 87 mg/dL (ref 70–99)
Glucose-Capillary: 87 mg/dL (ref 70–99)
Glucose-Capillary: 88 mg/dL (ref 70–99)
Glucose-Capillary: 93 mg/dL (ref 70–99)

## 2023-04-28 MED ORDER — DEXTROSE 50 % IV SOLN
12.5000 g | INTRAVENOUS | Status: AC
  Administered 2023-04-28: 12.5 g via INTRAVENOUS
  Filled 2023-04-28: qty 50

## 2023-04-28 NOTE — Progress Notes (Signed)
Physical Therapy Treatment Patient Details Name: Casey Rangel MRN: [id number] DOB: Mar 16, 1951 Today's Date: 04/28/2023   History of Present Illness Pt is a 72 y/o male presenting on 8/29 with L gaze, R sided weakness, and global aphasia. CT with concern for L MCA CVA, CT angiogram with occlusion of proximal L MCA M2. S/P thrombectomy 8/29. PMH L hip fx ; T9-11 fusion, L MCA CVA with residual aphasia, recent small volume SDH 01/2023., alzheimer's dementia.    PT Comments  Pt seen for PT tx with pt agreeable. Pt with impaired communication, as well as impaired cognition. Pt requires mod assist for bed mobility, min assist STS, and min increasing to mod assist to ambulate short distance with RW. Pt with decreased initiation & poor overall awareness. Will continue to follow pt acutely to progress mobility as able.    If plan is discharge home, recommend the following: Assistance with cooking/housework;Direct supervision/assist for medications management;Assist for transportation;Help with stairs or ramp for entrance;Supervision due to cognitive status;Assistance with feeding;A lot of help with walking and/or transfers;A lot of help with bathing/dressing/bathroom   Can travel by private vehicle     Yes  Equipment Recommendations  None recommended by PT (defer to next venue)    Recommendations for Other Services Rehab consult     Precautions / Restrictions Precautions Precautions: Fall Precaution Comments: aphasia Required Braces or Orthoses: Other Brace Other Brace: mittens restraints Restrictions Weight Bearing Restrictions: No     Mobility  Bed Mobility Overal bed mobility: Needs Assistance Bed Mobility: Supine to Sit, Sit to Supine     Supine to sit: Used rails, HOB elevated, Mod assist Sit to supine: Mod assist, Used rails, HOB elevated   General bed mobility comments: Pt initiates supine>sit but requires cuing/assist to place LUE on bed rail & mod assist to upright trunk, pt  also with poor initiation of sit>supine & requires assistance to elevate BLE onto bed.    Transfers Overall transfer level: Needs assistance Equipment used: Rolling walker (2 wheels) Transfers: Sit to/from Stand Sit to Stand: Min assist           General transfer comment: Pt pulls to stand with BUE on RW from elevated EOB.    Ambulation/Gait Ambulation/Gait assistance: Min assist, Mod assist Gait Distance (Feet): 50 Feet Assistive device: Rolling walker (2 wheels) Gait Pattern/deviations: Decreased step length - left, Decreased step length - right, Decreased dorsiflexion - right, Decreased dorsiflexion - left, Decreased stride length, Decreased weight shift to left, Decreased stance time - left       General Gait Details: Pt with R lateral lean in standing, also R hip/leg rotated posteriorly, pt almost walking semi sideways despite PT providing manual facilitation for neutral rotation. Pt with decreased step length BLE (RLE more than LLE) despite PT attempting to facilitate increased weight shift to L to allow increased step length RLE. Pt requires heavy cuing to continue ambulating 2/2 impaired cognition.   Stairs             Wheelchair Mobility     Tilt Bed    Modified Rankin (Stroke Patients Only)       Balance Overall balance assessment: Needs assistance Sitting-balance support: Feet supported, No upper extremity supported Sitting balance-Leahy Scale: Fair Sitting balance - Comments: supervision static sitting EOB   Standing balance support: Bilateral upper extremity supported, Reliant on assistive device for balance, During functional activity Standing balance-Leahy Scale: Poor  Cognition Arousal: Alert Behavior During Therapy: Flat affect Overall Cognitive Status: No family/caregiver present to determine baseline cognitive functioning                                 General Comments: Pt with  impaired initiation, poor ability to follow simple commands throughout session.        Exercises Other Exercises Other Exercises: PT attempted to engage pt in reaching outside of BOS with either hand while sitting EOB for balance training but pt with no initiation. Also attempted to have pt comb hair with either hand but no attempts initiated by pt.    General Comments        Pertinent Vitals/Pain Pain Assessment Pain Assessment: Faces Faces Pain Scale: No hurt    Home Living                          Prior Function            PT Goals (current goals can now be found in the care plan section) Acute Rehab PT Goals Patient Stated Goal: none stated at this time PT Goal Formulation: Patient unable to participate in goal setting Time For Goal Achievement: 05/01/23 Potential to Achieve Goals: Fair Progress towards PT goals: Progressing toward goals    Frequency    Min 1X/week      PT Plan      Co-evaluation              AM-PAC PT "6 Clicks" Mobility   Outcome Measure  Help needed turning from your back to your side while in a flat bed without using bedrails?: A Little Help needed moving from lying on your back to sitting on the side of a flat bed without using bedrails?: A Lot Help needed moving to and from a bed to a chair (including a wheelchair)?: A Lot Help needed standing up from a chair using your arms (e.g., wheelchair or bedside chair)?: A Little Help needed to walk in hospital room?: A Lot Help needed climbing 3-5 steps with a railing? : Total 6 Click Score: 13    End of Session   Activity Tolerance: Patient limited by fatigue;Patient tolerated treatment well Patient left: in bed;with call bell/phone within reach;with bed alarm set;with SCD's reapplied (BUE mittens donned)   PT Visit Diagnosis: Unsteadiness on feet (R26.81);Other abnormalities of gait and mobility (R26.89);Muscle weakness (generalized) (M62.81);Difficulty in walking, not  elsewhere classified (R26.2);Other symptoms and signs involving the nervous system (R29.898);Hemiplegia and hemiparesis Hemiplegia - Right/Left: Right Hemiplegia - dominant/non-dominant: Dominant Hemiplegia - caused by: Cerebral infarction     Time: 1610-9604 PT Time Calculation (min) (ACUTE ONLY): 24 min  Charges:    $Gait Training: 8-22 mins $Therapeutic Activity: 8-22 mins PT General Charges $$ ACUTE PT VISIT: 1 Visit                     [name], PT, DPT 04/28/23, 4:02 PM   [name] 04/28/2023, 4:01 PM

## 2023-04-28 NOTE — Consult Note (Signed)
Consult Note  Casey Rangel [birth year]  [id number].    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 Found to have L MCA M1 stroke and underwent mechanical thrombectomy requiring rescue left MCA angioplasty and stenting complicated by contrast stain of L sylvian fissure. Patient had some dysphagia prior to this most recent insult and now appears to have worsening. Was cleared for dysphagia diet with purees but noted to be retaining food and requiring a lot of cuing to swallow food. Patient is currently aphasic and follows only some commands. His sister is his Management consultant and he was living with her for the last several years. Trauma consulted for consideration of PEG placement. Last dose of Brilinta was this AM.   ROS: Review of Systems  Unable to perform ROS: Patient nonverbal    Family History  Problem Relation Age of Onset   Hearing loss Mother    Liver disease Brother    Hearing loss Paternal Grandmother     Past Medical History:  Diagnosis Date   GERD (gastroesophageal reflux disease)    Hiatal hernia    Stroke [facility])     Past Surgical History:  Procedure Laterality Date   INTRAMEDULLARY (IM) NAIL INTERTROCHANTERIC Left 10/17/2020   Procedure: INTRAMEDULLARY (IM) NAIL INTERTROCHANTRIC;  Surgeon: Kennedy Bucker, MD;  Location: [facility] ORS;  Service: Orthopedics;  Laterality: Left;   IR CT HEAD LTD  01/15/2022   IR CT HEAD LTD  04/16/2023   IR CT HEAD LTD  04/16/2023   IR INTRA CRAN STENT  04/16/2023   IR PERCUTANEOUS ART THROMBECTOMY/INFUSION INTRACRANIAL INC DIAG ANGIO  01/15/2022   IR PERCUTANEOUS ART THROMBECTOMY/INFUSION INTRACRANIAL INC DIAG ANGIO  04/16/2023   IR PTA  INTRACRANIAL  01/15/2022   IR US GUIDE VASC ACCESS RIGHT  01/15/2022   RADIOLOGY WITH ANESTHESIA N/A 01/15/2022   Procedure: RADIOLOGY WITH ANESTHESIA;  Surgeon: Julieanne Cotton, MD;  Location: [facility] OR;  Service: Radiology;  Laterality: N/A;   RADIOLOGY WITH ANESTHESIA N/A 04/16/2023   Procedure: IR WITH ANESTHESIA;  Surgeon: Radiologist, Medication, MD;  Location: [facility] OR;  Service: Radiology;  Laterality: N/A;   T9-T11 fusion in 1990      Social History:  reports that he quit smoking about 23 years ago. His smoking use included cigarettes. He started smoking about 53 years ago. He has a 45 pack-year smoking history. He has never used smokeless tobacco. He reports that he does not currently use alcohol. He reports that he does not use drugs.  Allergies:  Allergies  Allergen Reactions   Penicillins Other (See Comments)    Unknown     Medications Prior to Admission  Medication Sig Dispense Refill   aspirin EC 81 MG tablet Take 1 tablet (81 mg total) by mouth daily. Swallow whole. (Patient not taking: Reported on 04/17/2023) 30 tablet 12   carvedilol (COREG) 3.125 MG tablet Take 1 tablet (3.125 mg total) by mouth 2 (two) times daily with a meal. (Patient not taking: Reported on 03/11/2022) 60 tablet 1   donepezil (ARICEPT) 10 MG tablet Take half tablet (5 mg) daily for 2 weeks, then increase to the full tablet at 10 mg daily (Patient not taking:  Reported on 04/17/2023) 30 tablet 3   QUEtiapine (SEROQUEL) 25 MG tablet Take 1 tablet (25 mg total) by mouth at bedtime. (Patient not taking: Reported on 04/17/2023) 30 tablet 11   sacubitril-valsartan (ENTRESTO) 24-26 MG Take 1 tablet by mouth 2 (two) times daily. (Patient not taking: Reported on 03/11/2022) 60 tablet 1   senna-docusate (SENOKOT-S) 8.6-50 MG tablet Take 1 tablet by mouth at bedtime as needed for mild constipation or moderate constipation. (Patient not taking: Reported on 03/20/2022) 30 tablet 1    Blood pressure 117/79, pulse 66, temperature  97.7 F (36.5 C), temperature source Oral, resp. rate 17, height 5\' 10"  (1.778 m), weight 70.1 kg, SpO2 98%. Physical Exam:  General: WD, chronically ill appearing male who is laying in bed in NAD HEENT: head is normocephalic, atraumatic.  Sclera are noninjected. Ears and nose without any masses or lesions.  Mouth is pink and moist, dentition somewhat poor  Heart: regular, rate, and rhythm.  Normal s1,s2. No obvious murmurs, gallops, or rubs noted.  Palpable radial and pedal pulses bilaterally Lungs: CTAB, no wheezes, rhonchi, or rales noted.  Respiratory effort nonlabored Abd: soft, NT, ND, +BS, no masses, hernias, or organomegaly. No surgical scars noted MS: all 4 extremities are symmetrical with no cyanosis, clubbing, or edema. Skin: warm and dry with no masses, lesions, or rashes Neuro: regards name, no attempt at verbalization, intermittently followed commands with LLE but did not follow any other commands Psych: unable to assess   Results for orders placed or performed during the hospital encounter of 04/16/23 (from the past 48 hour(s))  Glucose, capillary     Status: Abnormal   Collection Time: 04/26/23  4:15 PM  Result Value Ref Range   Glucose-Capillary 105 (H) 70 - 99 mg/dL    Comment: Glucose reference range applies only to samples taken after fasting for at least 8 hours.  Magnesium     Status: None   Collection Time: 04/26/23  6:12 PM  Result Value Ref Range   Magnesium 2.1 1.7 - 2.4 mg/dL    Comment: Performed at [facility], [address] [place], [place] [address]  Phosphorus     Status: None   Collection Time: 04/26/23  6:12 PM  Result Value Ref Range   Phosphorus 3.4 2.5 - 4.6 mg/dL    Comment: Performed at [facility], [address], [place], [place] [address]  Glucose, capillary     Status: Abnormal   Collection Time: 04/26/23  9:08 PM  Result Value Ref Range   Glucose-Capillary 108 (H) 70 - 99 mg/dL    Comment: Glucose reference range applies  only to samples taken after fasting for at least 8 hours.  Glucose, capillary     Status: Abnormal   Collection Time: 04/27/23 12:42 AM  Result Value Ref Range   Glucose-Capillary 111 (H) 70 - 99 mg/dL    Comment: Glucose reference range applies only to samples taken after fasting for at least 8 hours.  Glucose, capillary     Status: Abnormal   Collection Time: 04/27/23  4:25 AM  Result Value Ref Range   Glucose-Capillary 110 (H) 70 - 99 mg/dL    Comment: Glucose reference range applies only to samples taken after fasting for at least 8 hours.   Comment 1 Notify RN    Comment 2 Document in Chart   Basic metabolic panel     Status: Abnormal   Collection Time: 04/27/23  7:28 AM  Result Value  Ref Range   Sodium 139 135 - 145 mmol/L   Potassium 3.5 3.5 - 5.1 mmol/L   Chloride 110 98 - 111 mmol/L   CO2 22 22 - 32 mmol/L   Glucose, Bld 99 70 - 99 mg/dL    Comment: Glucose reference range applies only to samples taken after fasting for at least 8 hours.   BUN 20 8 - 23 mg/dL   Creatinine, Ser 7.82 0.61 - 1.24 mg/dL   Calcium 8.4 (L) 8.9 - 10.3 mg/dL   GFR, Estimated >95 >62 mL/min    Comment: (NOTE) Calculated using the CKD-EPI Creatinine Equation (2021)    Anion gap 7 5 - 15    Comment: Performed at [facility], [address] [place], [place] [address]  CBC     Status: None   Collection Time: 04/27/23  7:28 AM  Result Value Ref Range   WBC 9.3 4.0 - 10.5 K/uL   RBC 4.90 4.22 - 5.81 MIL/uL   Hemoglobin 15.4 13.0 - 17.0 g/dL   HCT 57.8 46.9 - 62.9 %   MCV 91.0 80.0 - 100.0 fL   MCH 31.4 26.0 - 34.0 pg   MCHC 34.5 30.0 - 36.0 g/dL   RDW 52.8 41.3 - 24.4 %   Platelets 341 150 - 400 K/uL   nRBC 0.0 0.0 - 0.2 %    Comment: Performed at [facility], [address], [place], [place] [address]  Magnesium     Status: None   Collection Time: 04/27/23  7:28 AM  Result Value Ref Range   Magnesium 2.2 1.7 - 2.4 mg/dL    Comment: Performed at [facility], [address], [place], [place] [address]  Phosphorus     Status: None   Collection Time: 04/27/23  7:28 AM  Result Value Ref Range   Phosphorus 3.7 2.5 - 4.6 mg/dL    Comment: Performed at [facility], [address] [place], [place] [address]  Glucose, capillary     Status: None   Collection Time: 04/27/23  8:14 AM  Result Value Ref Range   Glucose-Capillary 97 70 - 99 mg/dL    Comment: Glucose reference range applies only to samples taken after fasting for at least 8 hours.  Glucose, capillary     Status: Abnormal   Collection Time: 04/27/23 12:09 PM  Result Value Ref Range   Glucose-Capillary 118 (H) 70 - 99 mg/dL    Comment: Glucose reference range applies only to samples taken after fasting for at least 8 hours.  Glucose, capillary     Status: Abnormal   Collection Time: 04/27/23  4:14 PM  Result Value Ref Range   Glucose-Capillary 118 (H) 70 - 99 mg/dL    Comment: Glucose reference range applies only to samples taken after fasting for at least 8 hours.  Glucose, capillary     Status: Abnormal   Collection Time: 04/27/23  7:56 PM  Result Value Ref Range   Glucose-Capillary 108 (H) 70 - 99 mg/dL    Comment: Glucose reference range applies only to samples taken after fasting for at least 8 hours.  Glucose, capillary     Status: None   Collection Time: 04/28/23 12:13 AM  Result Value Ref Range   Glucose-Capillary 88 70 - 99 mg/dL    Comment: Glucose reference range applies only to samples taken after fasting for at least 8 hours.  Glucose, capillary     Status: None  Collection Time: 04/28/23  4:49 AM  Result Value Ref Range   Glucose-Capillary 87 70 - 99 mg/dL    Comment: Glucose reference range applies only to samples taken after fasting for at least 8 hours.  Glucose, capillary     Status: None   Collection Time: 04/28/23  7:46 AM  Result Value Ref Range   Glucose-Capillary 81 70 - 99 mg/dL    Comment: Glucose reference range applies only to samples taken after fasting  for at least 8 hours.  Glucose, capillary     Status: None   Collection Time: 04/28/23 12:19 PM  Result Value Ref Range   Glucose-Capillary 93 70 - 99 mg/dL    Comment: Glucose reference range applies only to samples taken after fasting for at least 8 hours.   No results found.    Assessment/Plan Hx of prior L MCA CVA Recent BL SDH L MCA M1 CVA s/p mechanical thrombectomy requiring rescue left MCA angioplasty and stenting complicated by contrast stain of L sylvian fissure  Dysphagia  Consult received for PEG placement. Patient with no prior known abdominal surgery. Brilinta has been held starting today, last dose was earlier this AM. I called his sister to discuss procedure and advised that while PEG will allow for artificial nutrition and medication administration that it may not improve patient's QOL or neurologic condition overall. She understands but would like to proceed to give him time to see if he will improve some. Will plan for PEG placement this Thursday at 10 AM.   I reviewed nursing notes, last 24 h vitals and pain scores, last 48 h intake and output, last 24 h labs and trends, and SLP notes .   Juliet Rude, [facility] 04/28/2023, 1:31 PM Please see Amion for pager number during day hours 7:00am-4:30pm

## 2023-04-28 NOTE — Progress Notes (Signed)
Occupational Therapy Treatment Patient Details Name: Casey Rangel MRN: [id number] DOB: Nov 15, 1950 Today's Date: 04/28/2023   History of present illness Pt is a 72 y/o male presenting on 8/29 with L gaze, R sided weakness, and global aphasia. CT with concern for L MCA CVA, CT angiogram with occlusion of proximal L MCA M2. S/P thrombectomy 8/29. PMH L hip fx ; T9-11 fusion, L MCA CVA with residual aphasia, recent small volume SDH 01/2023., alzheimer's dementia.   OT comments  Patient making good progress with bed mobility and transfers requiring verbal and tactile cues for hand placement and safety. Patient performed grooming tasks at sink with patient able to wash face with tactile cue but required mod assist for oral care and brushing hair. Attempted grooming standing at sink with patient reliant on sink for support. Patient will benefit from continued inpatient follow up therapy, <3 hours/day to continue to increase safety and independence with self care. Acute OT to continue to follow.       If plan is discharge home, recommend the following:  A lot of help with bathing/dressing/bathroom;Assistance with cooking/housework;Direct supervision/assist for medications management;Direct supervision/assist for financial management;Assist for transportation;Help with stairs or ramp for entrance;A lot of help with walking and/or transfers   Equipment Recommendations  Other (comment) (defer)    Recommendations for Other Services      Precautions / Restrictions Precautions Precautions: Fall Precaution Comments: aphasia Other Brace: mittens restraints Restrictions Weight Bearing Restrictions: No       Mobility Bed Mobility Overal bed mobility: Needs Assistance Bed Mobility: Supine to Sit, Sit to Supine     Supine to sit: Min assist Sit to supine: Min assist   General bed mobility comments: min assist for trunk to get to EOB and assistance with BLEs to return to supine     Transfers Overall transfer level: Needs assistance Equipment used: Rolling walker (2 wheels) Transfers: Sit to/from Stand, Bed to chair/wheelchair/BSC Sit to Stand: Min assist           General transfer comment: transferred to [facility] and ambulated to chair at sink with min assist and cues for hand placement     Balance Overall balance assessment: Needs assistance Sitting-balance support: Feet supported, No upper extremity supported Sitting balance-Leahy Scale: Fair Sitting balance - Comments: sits EOB with close supervision   Standing balance support: Single extremity supported, Bilateral upper extremity supported, During functional activity Standing balance-Leahy Scale: Poor Standing balance comment: reliant on RW or sink for support                           ADL either performed or assessed with clinical judgement   ADL Overall ADL's : Needs assistance/impaired     Grooming: Wash/dry hands;Wash/dry face;Oral care;Brushing hair;Moderate assistance;Sitting;Standing Grooming Details (indicate cue type and reason): able to wash face with tactile and verbal cues, required assistance with oral care and brusing hair. Attempted grooming tasks standing with limited participation due to difficulty following commands                 Toilet Transfer: Minimal assistance;BSC/3in1;Rolling walker (2 wheels) Toilet Transfer Details (indicate cue type and reason): cues for hand placement and walker management           General ADL Comments: limited due to difficulty following directions    Extremity/Trunk Assessment              Vision       Perception  Praxis      Cognition Arousal: Alert Behavior During Therapy: Flat affect Overall Cognitive Status: No family/caregiver present to determine baseline cognitive functioning                                 General Comments: following 1 step commands inconsistantly        Exercises       Shoulder Instructions       General Comments      Pertinent Vitals/ Pain       Pain Assessment Pain Assessment: Faces Faces Pain Scale: No hurt Pain Intervention(s): Monitored during session  Home Living                                          Prior Functioning/Environment              Frequency  Min 1X/week        Progress Toward Goals  OT Goals(current goals can now be found in the care plan section)  Progress towards OT goals: Progressing toward goals  Acute Rehab OT Goals OT Goal Formulation: Patient unable to participate in goal setting Time For Goal Achievement: 05/01/23 Potential to Achieve Goals: Fair ADL Goals Pt Will Perform Grooming: with min assist;sitting Pt Will Perform Upper Body Bathing: with min assist;sitting Pt Will Perform Lower Body Dressing: with mod assist;sit to/from stand;sitting/lateral leans Pt Will Transfer to Toilet: with mod assist;ambulating Additional ADL Goal #1: Pt will be follow 1 step commands with 90% accuracy during ADLs. Additional ADL Goal #2: Pt will scan to R side with minimal cueing.  Plan      Co-evaluation                 AM-PAC OT "6 Clicks" Daily Activity     Outcome Measure   Help from another person eating meals?: Total Help from another person taking care of personal grooming?: A Lot Help from another person toileting, which includes using toliet, bedpan, or urinal?: A Lot Help from another person bathing (including washing, rinsing, drying)?: A Lot Help from another person to put on and taking off regular upper body clothing?: A Lot Help from another person to put on and taking off regular lower body clothing?: A Lot 6 Click Score: 11    End of Session Equipment Utilized During Treatment: Gait belt;Rolling walker (2 wheels)  OT Visit Diagnosis: Unsteadiness on feet (R26.81);Muscle weakness (generalized) (M62.81) Symptoms and signs involving cognitive functions: Cerebral  infarction Hemiplegia - Right/Left: Right Hemiplegia - caused by: Cerebral infarction   Activity Tolerance Patient tolerated treatment well   Patient Left in bed;with call bell/phone within reach;with bed alarm set;with restraints reapplied   Nurse Communication Mobility status        Time: 1610-9604 OT Time Calculation (min): 28 min  Charges: OT General Charges $OT Visit: 1 Visit OT Treatments $Self Care/Home Management : 23-37 mins  Alfonse Flavors, OTA Acute Rehabilitation Services  Office [phone]   Dewain Penning 04/28/2023, 10:15 AM

## 2023-04-28 NOTE — Progress Notes (Addendum)
0800 patient alert to self follows some commands mitts placed on patient pulling at EKG leads took off scds. RN attempted to feed patient patient pocketing food unsafe for diet. Patient able to take meds in NTL with lots of cues to swallow. Patient had no corpak for tube feeding orders care team aware. 1115 sister called asking questions regarding peg tube placement she states she did not understand that was for feedings and would like the peg tube

## 2023-04-28 NOTE — Progress Notes (Signed)
Nutrition Follow-up  DOCUMENTATION CODES:  Non-severe (moderate) malnutrition in context of chronic illness  INTERVENTION:  Continue current diet as ordered per SLP as pt is able Feeding assistance Automatic trays from dining services Monitor for PEG placement. Restart enteral nutrition when cleared for use. Pt will be at risk for refeeding syndrome due to his prolonged state of inadequate nutrition this admission. Recommend 5 days of thiamine 100mg  and titrating TF to goal rate slowly.  Osmolite 1.5 at 50 ml/h (1200 ml per day) Start at 20 mL/hour and advance by 10 mL/hour every 12 hours to goal rate of 50 mL/hour  Prosource TF20 60 ml 1x/d free water flush of 145 mL every 4 hours  Provides 1880 kcal, 95 gm protein, 912 ml free water daily (TF+flush = of free water)  NUTRITION DIAGNOSIS:  Moderate Malnutrition related to chronic illness (stroke, dementia, SDH) as evidenced by mild fat depletion, severe muscle depletion. - remains applicable   GOAL:  Patient will meet greater than or equal to 90% of their needs Not met - PEG to be placed 9/12.  MONITOR:  PO intake, Diet advancement, Labs, Weight trends, Skin  REASON FOR ASSESSMENT:  Consult Enteral/tube feeding initiation and management  ASSESSMENT:  Pt admitted with R sided weakness, L gaze deviation and global aphasia d/t acute L MCA stroke. 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  8/29 - s/p mechanical thrombectomy L MCA + stent placement 8/30 - Cortrak placed (tip gastric)  8/31 - Cortrak tube removed, NG tube inserted 9/1 - pulled NG tube, NG reinserted and then pulled 9/3 - s/p BSE by SLP, initiate dysphagia 1/honey thick liquids  9/4 - 48 hour calorie count ordered 9/5 - s/p MBS by SLP, diet advanced to dysphagia 2 with nectar thick liquids 9/9 - diet downgraded to DYS1  Pt resting in bed at the time  of assessment aphasic and does not interact. Diet was downgraded by SLP yesterday. Discussed intake with NT and RN. Reports that pt is pocketing all food and had to be suctioned from oral cavity. Will accept liquids by a spoon. Pt has lost significant weight and looks very debilitated and frail.   Discussed with Neurology service outside of room. Several attempts have been made to have GOC discussions with daughter but scheduling conflicts have prevented her presence. Pt has been without proper nutrition for the duration of his admission due to pt pulling several NG tubes out and with his inability to take in adequate PO. Neurology reached out to surgery for PEG tube placement today. Noted that consent was obtained and PEG placement is currently scheduled for 9/13.  Pt will be at risk for refeeding. Will leave recommendations above for enteral nutrition regimen but recommend monitoring electrolytes q12h x 72 hours and MD to replete as needed.  Admit weight 76.4 kg Current Weight 70.1 kg 8.2% weight loss x ~2 weeks if scales accurate, severe loss  Average Meal Intake: 9/7-9/9: 32% average intake x 5 recorded meals since last assessment  Nutritionally Relevant Medications: Scheduled Meds:  megestrol  400 mg Oral Daily   potassium chloride  40 mEq Oral Daily   rosuvastatin  20 mg Oral Daily   thiamine  100 mg Oral Daily   Continuous Infusions:  sodium chloride 50 mL/hr at 04/28/23 0800   Labs Reviewed  Diet Order:   Diet Order  DIET - DYS 1 Room service appropriate? No; Fluid consistency: Nectar Thick  Diet effective now                  EDUCATION NEEDS:  No education needs have been identified at this time  Skin:  Skin Assessment: Reviewed RN Assessment  Last BM:  9/8  Height:  Ht Readings from Last 1 Encounters:  04/16/23 5\' 10"  (1.778 m)   Weight:  Wt Readings from Last 1 Encounters:  04/28/23 70.1 kg   BMI:  Body mass index is 22.17 kg/m.  Estimated  Nutritional Needs:  Kcal:  1800-2000 Protein:  90-105g Fluid:  >/=1.8L    Greig Castilla, RD, LDN Clinical Dietitian RD pager # available in [facility]  After hours/weekend pager # available in [facility]

## 2023-04-28 NOTE — Progress Notes (Addendum)
STROKE TEAM PROGRESS NOTE   BRIEF HPI Casey Rangel is a 72 y.o. 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 Underwent mechanic thrombectomy of L MCA M1 (TICI 3) complicated by contrast stain in posterior L putamen.   SIGNIFICANT HOSPITAL EVENTS 8/29: mechanic thrombectomy of L MCA M1 (TICI 3) requiring rescue left MCA angioplasty and stenting complicated by contrast stain L sylvian fissure 8/31 - pulled cortrak tube -> NG inserted 9/1 - pulled NG tube -> NG reinserted -> pulled 9/2: speech eval, able to take meds crushed with applesauce 9/8: Seroquel and Megace started. Unable to have planned family meeting. 9/9: unable to have family meeting, sister unable to come to hospital  INTERIM HISTORY/SUBJECTIVE No family at bedside. RN spoke with sister who states that she does want him to have a PEG tube. Consulted Dr. Janee Morn with Trauma for PEG. Brilinta stopped.  Neurological exam is unchanged and stable. Labs ordered for MWF   OBJECTIVE  CBC    Component Value Date/Time   WBC 9.3 04/27/2023 0728   RBC 4.90 04/27/2023 0728   HGB 15.4 04/27/2023 0728   HCT 44.6 04/27/2023 0728   PLT 341 04/27/2023 0728   MCV 91.0 04/27/2023 0728   MCH 31.4 04/27/2023 0728   MCHC 34.5 04/27/2023 0728   RDW 13.3 04/27/2023 0728   LYMPHSABS 1.0 04/17/2023 0552   MONOABS 0.8 04/17/2023 0552   EOSABS 0.0 04/17/2023 0552   BASOSABS 0.0 04/17/2023 0552    BMET    Component Value Date/Time   NA 139 04/27/2023 0728   K 3.5 04/27/2023 0728   CL 110 04/27/2023 0728   CO2 22 04/27/2023 0728   GLUCOSE 99 04/27/2023 0728   BUN 20 04/27/2023 0728   CREATININE 0.99 04/27/2023 0728   CALCIUM 8.4 (L) 04/27/2023 0728   GFRNONAA >60 04/27/2023 0728   IMAGING past 24 hours No results found.  Vitals:   04/28/23  0352 04/28/23 0353 04/28/23 0749 04/28/23 1143  BP: 136/82  124/88 117/79  Pulse: 70  92 66  Resp: 18  15 17   Temp: 98.1 F (36.7 C)  97.7 F (36.5 C) 97.7 F (36.5 C)  TempSrc: Oral  Oral Oral  SpO2: 100%  97% 98%  Weight:  70.1 kg    Height:        PHYSICAL EXAM General: intubated CV: Regular rate and rhythm on monitor Respiratory:  synchronous with ventilator GI: Abdomen soft and nontender  NEURO:  Neuro -, eyes open, global aphasia, nonverbal and not following verbal commands, not able to pantomime, not able to repeat or name. Left gaze preference but able to have right gaze, blinking to visual threat on the left but not on right. Right facial droop. Tongue protrusion not cooperative.  LUE 4+/5 and RUE 4/5 with slight pronator drift. LLE 3/5 and RLE 3/5, but withdraw less to pain on the right than left.  Sensation, coordination and gait not tested.   ASSESSMENT/PLAN  Acute Ischemic Infarct:  left MCA scattered infarcts due to left M2 MCA occlusion s/p IR with TICI3 and left M2 stenting, etiology likely large vessel disease  Code Stroke CT head Acute L MCA territory infarct involving L insula. Small vessel disease. ASPECTS 9.   CTA head & neck Acute occlusion of a proximal left M2, left M1 moderate stenosis, right  ICA bulb ulcerated plaque, bilateral ICA bulb atherosclerosis. CT perfusion 45/91 S/p IR with TICI3 and left M2 stenting CT head s/p stent L MCA, Hyperdense material in the left Sylvian fissure, likely extravasated contrast. MRI  Multifocal acute ischemia within the left MCA territory. No hemorrhage or mass effect. 2D Echo EF 45-50%. Global hypokinesis of LV LDL 132 HgbA1c 5.2 VTE prophylaxis - lovenox 40 aspirin 81 mg daily prior to admission, now on aspirin 81 mg daily and Brilinta (ticagrelor) 90 mg bid for stent.  (Brilinta stopped today in preparation for PEG) Therapy recommendations:  SNF Disposition:  pending  Hx of Stroke/TIA 01/2022 admitted for  stroke. CT showed old left parietal occipital, right PCA and right MCA/ACA infarcts.  CTA head and neck left ICA terminus string sign and possible occlusion.  CTP positive penumbra.  Status post IR with TICI3. MRI showed left patchy MCA infarct. MRA showed left ICA terminus patent but left M1 moderate stenosis.  EF 35 to 40%, LDL 129, A1c 5.1.  Patient discharged on DAPT and Crestor 20. 30-day cardiac event monitoring as outpatient showed no active but only has 7 days of data.  Hypertension HFrEF Home meds:  coreg 3.125 twice daily, entresto 24-26 twice daily Stable On Coreg 3.125 Long-term BP goal normotensive  Hyperlipidemia Home meds:  none LDL 132, goal < 70 On crestor 20 Continue statin at discharge  Agitation  Start seroquel 12.5 mg   Dysphagia Currently on dysphagia 1 diet with honey thick liquids Able to take medications with applesauce Pulled off cortrak-> NG placed -> pulled off NG -> NG re-inserted->pulled off again Coretrak placed 9/6. Pulled out again 9/7. Consult for PEG placed today   Other Stroke Risk Factors ETOH use, alcohol level <10, advised to drink no more than 2 drink(s) a day Congestive heart failure Former smoker  Other Active Problems Leukocytosis, improved: WBC 11.8--11.1--10.8-9.8 -> 7.6 AKI, creatinine 1.22--1.04--0.99-1.05 -> 0.81 Labs ordered [facility]  Hospital day # 12   Pt seen by Neuro NP/APP and later by MD. Note/plan to be edited by MD as needed.    Lynnae January, DNP, AGACNP-BC [facility] Please use AMION for contact information & EPIC for messaging.  I have personally obtained history,examined this patient, reviewed notes, independently viewed imaging studies, participated in medical decision making and plan of care.ROS completed by me personally and pertinent positives fully documented  I have made any additions or clarifications directly to the above note. Agree with note above.  Patient neurological exam is unchanged with  significant global aphasia and dysphagia.  Patient's family is agreed to a PEG tube and nursing home placement plans will call trauma team to arrange for the same.  Discussed with Dr. Janee Morn trauma team.  I spoke to the patient's Sister [name] over the phone who agreed with PEG tube placement and answered her questions.  Greater than 50% time during the 35-minute visit was spent on counseling and coordination of care and discussion with care team and answering questions.  Delia Heady, MD Medical Director [facility] Pager: [phone] 04/28/2023 12:56 PM     To contact Stroke Continuity provider, please refer to [email or web address]. After hours, contact General Neurology

## 2023-04-28 NOTE — Plan of Care (Signed)
  Problem: Ischemic Stroke/TIA Tissue Perfusion: Goal: Complications of ischemic stroke/TIA will be minimized Outcome: Progressing   Problem: Clinical Measurements: Goal: Diagnostic test results will improve Outcome: Progressing   Problem: Clinical Measurements: Goal: Will remain free from infection Outcome: Progressing

## 2023-04-29 LAB — GLUCOSE, CAPILLARY
Glucose-Capillary: 140 mg/dL — ABNORMAL HIGH (ref 70–99)
Glucose-Capillary: 69 mg/dL — ABNORMAL LOW (ref 70–99)
Glucose-Capillary: 72 mg/dL (ref 70–99)
Glucose-Capillary: 78 mg/dL (ref 70–99)
Glucose-Capillary: 79 mg/dL (ref 70–99)
Glucose-Capillary: 81 mg/dL (ref 70–99)
Glucose-Capillary: 94 mg/dL (ref 70–99)
Glucose-Capillary: 96 mg/dL (ref 70–99)

## 2023-04-29 LAB — CBC
HCT: 41.5 % (ref 39.0–52.0)
Hemoglobin: 14.1 g/dL (ref 13.0–17.0)
MCH: 30 pg (ref 26.0–34.0)
MCHC: 34 g/dL (ref 30.0–36.0)
MCV: 88.3 fL (ref 80.0–100.0)
Platelets: 340 10*3/uL (ref 150–400)
RBC: 4.7 MIL/uL (ref 4.22–5.81)
RDW: 13 % (ref 11.5–15.5)
WBC: 9.1 10*3/uL (ref 4.0–10.5)
nRBC: 0 % (ref 0.0–0.2)

## 2023-04-29 LAB — BASIC METABOLIC PANEL
Anion gap: 10 (ref 5–15)
BUN: 18 mg/dL (ref 8–23)
CO2: 19 mmol/L — ABNORMAL LOW (ref 22–32)
Calcium: 8.5 mg/dL — ABNORMAL LOW (ref 8.9–10.3)
Chloride: 109 mmol/L (ref 98–111)
Creatinine, Ser: 0.89 mg/dL (ref 0.61–1.24)
GFR, Estimated: >60 mL/min (ref >60)
Glucose, Bld: 77 mg/dL (ref 70–99)
Potassium: 4 mmol/L (ref 3.5–5.1)
Sodium: 138 mmol/L (ref 135–145)

## 2023-04-29 MED ORDER — DEXTROSE 50 % IV SOLN
INTRAVENOUS | Status: AC
  Filled 2023-04-29: qty 50

## 2023-04-29 MED ORDER — DEXTROSE-SODIUM CHLORIDE 5-0.9 % IV SOLN: INTRAVENOUS | Status: DC

## 2023-04-29 MED ORDER — DEXTROSE 50 % IV SOLN
12.5000 g | INTRAVENOUS | Status: AC
  Administered 2023-04-29: 12.5 g via INTRAVENOUS
  Filled 2023-04-29: qty 50

## 2023-04-29 NOTE — Progress Notes (Signed)
Physical Therapy Treatment Patient Details Name: Casey Rangel MRN: [id number] DOB: 06/02/1951 Today's Date: 04/29/2023   History of Present Illness Pt is a 72 y/o male presenting on 8/29 with L gaze, R sided weakness, and global aphasia. CT with concern for L MCA CVA, CT angiogram with occlusion of proximal L MCA M2. S/P thrombectomy 8/29. PMH L hip fx ; T9-11 fusion, L MCA CVA with residual aphasia, recent small volume SDH 01/2023., alzheimer's dementia.    PT Comments  Pt non-verbal throughout session and demonstrating difficulty following verbal commands as well as difficulty with initiating and sequencing all mobility. Pt needing mod A to come to sitting EOB and max A to begin standing despite multimodal cueing and short commands to stand, once initiated pt able to come up to full stand with light min A. Pt continues to demonstrate strong R lateral lean in standing needing mod-max A and manual facilitation to correct during sit<>stand and during gait. Current plan remains appropriate to address deficits and maximize functional independence and decrease caregiver burden. Pt continues to benefit from skilled PT services to progress toward functional mobility goals.     If plan is discharge home, recommend the following: Assistance with cooking/housework;Direct supervision/assist for medications management;Assist for transportation;Help with stairs or ramp for entrance;Supervision due to cognitive status;Assistance with feeding;A lot of help with walking and/or transfers;A lot of help with bathing/dressing/bathroom   Can travel by private vehicle     Yes  Equipment Recommendations  None recommended by PT (defer to next venue)    Recommendations for Other Services       Precautions / Restrictions Precautions Precautions: Fall Precaution Comments: aphasia Required Braces or Orthoses: Other Brace Other Brace: mittens restraints Restrictions Weight Bearing Restrictions: No     Mobility   Bed Mobility Overal bed mobility: Needs Assistance Bed Mobility: Supine to Sit, Sit to Supine     Supine to sit: Used rails, HOB elevated, Mod assist Sit to supine: Used rails, Supervision   General bed mobility comments: Pt initiates supine>sit but requires cuing/assist to place LUE on bed rail & mod assist to upright trunk, with significantly increased time pt able to self initiate sit>supine with supervision for safety    Transfers Overall transfer level: Needs assistance Equipment used: Rolling walker (2 wheels) Transfers: Sit to/from Stand Sit to Stand: Min assist           General transfer comment: Pt pulls to stand with BUE on RW from elevated EOB max A to initiaed but once initiated min A to come up to full stand    Ambulation/Gait Ambulation/Gait assistance: Mod assist, Max assist Gait Distance (Feet): 56 Feet Assistive device: Rolling walker (2 wheels) Gait Pattern/deviations: Decreased step length - left, Decreased step length - right, Decreased dorsiflexion - right, Decreased dorsiflexion - left, Decreased stride length, Decreased weight shift to left, Decreased stance time - left Gait velocity: decreased     General Gait Details: Pt with stong R lateral lean in standing, also R hip/leg rotated externally, pt almost walking semi sideways, manual facilitation for neutral rotation. Pt requires heavy cuing to continue ambulating 2/2 impaired cognition.   Stairs             Wheelchair Mobility     Tilt Bed    Modified Rankin (Stroke Patients Only) Modified Rankin (Stroke Patients Only) Pre-Morbid Rankin Score: Slight disability Modified Rankin: Moderately severe disability     Balance Overall balance assessment: Needs assistance Sitting-balance support: Feet supported, No upper  extremity supported Sitting balance-Leahy Scale: Fair Sitting balance - Comments: supervision static sitting EOB   Standing balance support: Bilateral upper extremity  supported, Reliant on assistive device for balance, During functional activity Standing balance-Leahy Scale: Poor Standing balance comment: reliant on RW or sink for support                            Cognition Arousal: Alert Behavior During Therapy: Flat affect Overall Cognitive Status: No family/caregiver present to determine baseline cognitive functioning                                 General Comments: Pt with impaired initiation, poor ability to follow simple commands throughout session., pt luaghing x1 at PTA joke        Exercises Other Exercises Other Exercises: sit<>stand x5 with RLE elevated on tri-folded fall mat to encourage L lateral lean in standing as pt unable to corerct with cues, improved with mat placed but poor carryover with x1 sit<>stand at end of session    General Comments        Pertinent Vitals/Pain Pain Assessment Pain Assessment: Faces Faces Pain Scale: No hurt Pain Intervention(s): Monitored during session    Home Living                          Prior Function            PT Goals (current goals can now be found in the care plan section) Acute Rehab PT Goals Patient Stated Goal: none stated at this time PT Goal Formulation: Patient unable to participate in goal setting Time For Goal Achievement: 05/01/23 Progress towards PT goals: Progressing toward goals    Frequency    Min 1X/week      PT Plan      Co-evaluation              AM-PAC PT "6 Clicks" Mobility   Outcome Measure  Help needed turning from your back to your side while in a flat bed without using bedrails?: A Little Help needed moving from lying on your back to sitting on the side of a flat bed without using bedrails?: A Lot Help needed moving to and from a bed to a chair (including a wheelchair)?: A Lot Help needed standing up from a chair using your arms (e.g., wheelchair or bedside chair)?: A Little Help needed to walk in  hospital room?: A Lot Help needed climbing 3-5 steps with a railing? : Total 6 Click Score: 13    End of Session Equipment Utilized During Treatment: Gait belt Activity Tolerance: Patient limited by fatigue;Patient tolerated treatment well Patient left: in bed;with call bell/phone within reach;with bed alarm set (BUE mittens donned, with bed in chair position) Nurse Communication: Mobility status PT Visit Diagnosis: Unsteadiness on feet (R26.81);Other abnormalities of gait and mobility (R26.89);Muscle weakness (generalized) (M62.81);Difficulty in walking, not elsewhere classified (R26.2);Other symptoms and signs involving the nervous system (R29.898);Hemiplegia and hemiparesis Hemiplegia - Right/Left: Right Hemiplegia - dominant/non-dominant: Dominant Hemiplegia - caused by: Cerebral infarction     Time: 1610-9604 PT Time Calculation (min) (ACUTE ONLY): 28 min  Charges:    $Gait Training: 8-22 mins $Neuromuscular Re-education: 8-22 mins PT General Charges $$ ACUTE PT VISIT: 1 Visit  Lenora Boys. PTA Acute Rehabilitation Services Office: [phone]   Catalina Antigua 04/29/2023, 12:43 PM

## 2023-04-29 NOTE — Plan of Care (Signed)
  Problem: Education: Goal: Knowledge of disease or condition will improve Outcome: Progressing Goal: Knowledge of secondary prevention will improve (MUST DOCUMENT ALL) Outcome: Progressing Goal: Knowledge of patient specific risk factors will improve Loraine Leriche N/A or DELETE if not current risk factor) Outcome: Progressing   Problem: Ischemic Stroke/TIA Tissue Perfusion: Goal: Complications of ischemic stroke/TIA will be minimized Outcome: Progressing   Problem: Coping: Goal: Will verbalize positive feelings about self Outcome: Progressing Goal: Will identify appropriate support needs Outcome: Progressing   Problem: Health Behavior/Discharge Planning: Goal: Ability to manage health-related needs will improve Outcome: Progressing Goal: Goals will be collaboratively established with patient/family Outcome: Progressing   Problem: Nutrition: Goal: Risk of aspiration will decrease Outcome: Progressing   Problem: Education: Goal: Understanding of CV disease, CV risk reduction, and recovery process will improve Outcome: Progressing Goal: Individualized Educational Video(s) Outcome: Progressing   Problem: Activity: Goal: Ability to return to baseline activity level will improve Outcome: Progressing   Problem: Cardiovascular: Goal: Ability to achieve and maintain adequate cardiovascular perfusion will improve Outcome: Progressing Goal: Vascular access site(s) Level 0-1 will be maintained Outcome: Progressing   Problem: Health Behavior/Discharge Planning: Goal: Ability to safely manage health-related needs after discharge will improve Outcome: Progressing   Problem: Education: Goal: Knowledge of General Education information will improve Description: Including pain rating scale, medication(s)/side effects and non-pharmacologic comfort measures Outcome: Progressing   Problem: Health Behavior/Discharge Planning: Goal: Ability to manage health-related needs will improve Outcome:  Progressing   Problem: Clinical Measurements: Goal: Ability to maintain clinical measurements within normal limits will improve Outcome: Progressing Goal: Will remain free from infection Outcome: Progressing Goal: Diagnostic test results will improve Outcome: Progressing Goal: Respiratory complications will improve Outcome: Progressing Goal: Cardiovascular complication will be avoided Outcome: Progressing   Problem: Activity: Goal: Risk for activity intolerance will decrease Outcome: Progressing   Problem: Coping: Goal: Level of anxiety will decrease Outcome: Progressing   Problem: Pain Managment: Goal: General experience of comfort will improve Outcome: Progressing   Problem: Safety: Goal: Ability to remain free from injury will improve Outcome: Progressing   Problem: Skin Integrity: Goal: Risk for impaired skin integrity will decrease Outcome: Progressing

## 2023-04-29 NOTE — Progress Notes (Signed)
Patient is mute and unaware of his true cognitive status. He does follow commands. No pain indicators present. Crushing meds and giving to patient. Poor appetite, Pending PEG tube placement. Attempted x2 to call patient's sister Barbee Shropshire [phone]) to obtain phone consent but was unable to reach her-left VM. CHG completed. Patient has oliguria and only put out 150 mls over the night. Has on mittens. Patient is currently resting what seems to be comfortably.

## 2023-04-29 NOTE — Progress Notes (Addendum)
STROKE TEAM PROGRESS NOTE   BRIEF HPI Casey Rangel is a 72 y.o. 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 Underwent mechanic thrombectomy of L MCA M1 (TICI 3) complicated by contrast stain in posterior L putamen.   SIGNIFICANT HOSPITAL EVENTS 8/29: mechanic thrombectomy of L MCA M1 (TICI 3) requiring rescue left MCA angioplasty and stenting complicated by contrast stain L sylvian fissure 8/31 - pulled cortrak tube -> NG inserted 9/1 - pulled NG tube -> NG reinserted -> pulled 9/2: speech eval, able to take meds crushed with applesauce 9/8: Seroquel and Megace started. Unable to have planned family meeting. 9/9: unable to have family meeting, sister unable to come to hospital  INTERIM HISTORY/SUBJECTIVE No family at bedside.  PEG tube to be placed on Thursday Patient is sitting o the side of the bed working with therapy Neurological exam is unchanged and stable. Labs and vital stable    OBJECTIVE  CBC    Component Value Date/Time   WBC 9.1 04/29/2023 0740   RBC 4.70 04/29/2023 0740   HGB 14.1 04/29/2023 0740   HCT 41.5 04/29/2023 0740   PLT 340 04/29/2023 0740   MCV 88.3 04/29/2023 0740   MCH 30.0 04/29/2023 0740   MCHC 34.0 04/29/2023 0740   RDW 13.0 04/29/2023 0740   LYMPHSABS 1.0 04/17/2023 0552   MONOABS 0.8 04/17/2023 0552   EOSABS 0.0 04/17/2023 0552   BASOSABS 0.0 04/17/2023 0552    BMET    Component Value Date/Time   NA 138 04/29/2023 0740   K 4.0 04/29/2023 0740   CL 109 04/29/2023 0740   CO2 19 (L) 04/29/2023 0740   GLUCOSE 77 04/29/2023 0740   BUN 18 04/29/2023 0740   CREATININE 0.89 04/29/2023 0740   CALCIUM 8.5 (L) 04/29/2023 0740   GFRNONAA >60 04/29/2023 0740   IMAGING past 24 hours No results found.  Vitals:   04/29/23 0500 04/29/23 0517 04/29/23 0746  04/29/23 1108  BP:  (!) 139/56 (!) 156/68 (!) 143/69  Pulse:  60 (!) 59 62  Resp:  18 16 14   Temp:  97.8 F (36.6 C) 97.8 F (36.6 C) 97.7 F (36.5 C)  TempSrc:  Oral Oral Oral  SpO2:  100% 100% 100%  Weight: 71.8 kg     Height:        PHYSICAL EXAM General: intubated CV: Regular rate and rhythm on monitor Respiratory:  synchronous with ventilator GI: Abdomen soft and nontender  NEURO:  Neuro -, eyes open, global aphasia, nonverbal and not following verbal commands, not able to pantomime, not able to repeat or name. Left gaze preference but able to have right gaze, blinking to visual threat on the left but not on right. Right facial droop. Tongue protrusion not cooperative.  LUE 4+/5 and RUE 4/5 with slight pronator drift. LLE 3/5 and RLE 3/5, but withdraw less to pain on the right than left.  Sensation, coordination and gait not tested.   ASSESSMENT/PLAN  Acute Ischemic Infarct:  left MCA scattered infarcts due to left M2 MCA occlusion s/p IR with TICI3 and left M2 stenting, etiology likely large vessel disease  Code Stroke CT head Acute L MCA territory infarct involving L insula. Small vessel disease. ASPECTS 9.   CTA head & neck Acute occlusion of a proximal left M2, left M1 moderate stenosis, right  ICA bulb ulcerated plaque, bilateral ICA bulb atherosclerosis. CT perfusion 45/91 S/p IR with TICI3 and left M2 stenting CT head s/p stent L MCA, Hyperdense material in the left Sylvian fissure, likely extravasated contrast. MRI  Multifocal acute ischemia within the left MCA territory. No hemorrhage or mass effect. 2D Echo EF 45-50%. Global hypokinesis of LV LDL 132 HgbA1c 5.2 VTE prophylaxis - lovenox 40 aspirin 81 mg daily prior to admission, now on aspirin 81 mg daily and Brilinta (ticagrelor) 90 mg bid for stent.  (Brilinta stopped today in preparation for PEG) Therapy recommendations:  SNF Disposition:  pending  Hx of Stroke/TIA 01/2022 admitted for stroke. CT showed old  left parietal occipital, right PCA and right MCA/ACA infarcts.  CTA head and neck left ICA terminus string sign and possible occlusion.  CTP positive penumbra.  Status post IR with TICI3. MRI showed left patchy MCA infarct. MRA showed left ICA terminus patent but left M1 moderate stenosis.  EF 35 to 40%, LDL 129, A1c 5.1.  Patient discharged on DAPT and Crestor 20. 30-day cardiac event monitoring as outpatient showed no active but only has 7 days of data.  Hypertension HFrEF Home meds:  coreg 3.125 twice daily, entresto 24-26 twice daily Stable On Coreg 3.125 Long-term BP goal normotensive  Hyperlipidemia Home meds:  none LDL 132, goal < 70 On crestor 20 Continue statin at discharge  Agitation  Start seroquel 12.5 mg   Dysphagia Currently on dysphagia 1 diet with honey thick liquids Able to take medications with applesauce Pulled off cortrak-> NG placed -> pulled off NG -> NG re-inserted->pulled off again Coretrak placed 9/6. Pulled out again 9/7. Consult for PEG placed on Thursday  Other Stroke Risk Factors ETOH use, alcohol level <10, advised to drink no more than 2 drink(s) a day Congestive heart failure Former smoker  Other Active Problems Leukocytosis, improved: WBC 11.8--11.1--10.8-9.8 -> 7.6 AKI, creatinine 1.22--1.04--0.99-1.05 -> 0.81 Labs ordered [facility]  Hospital day # 13   Pt seen by Neuro NP/APP and later by MD. Note/plan to be edited by MD as needed.    Gevena Mart DNP, ACNPC-AG  [facility]  I have personally obtained history,examined this patient, reviewed notes, independently viewed imaging studies, participated in medical decision making and plan of care.ROS completed by me personally and pertinent positives fully documented  I have made any additions or clarifications directly to the above note. Agree with note above.  Neurological exam is stable.  PEG tube planned for tomorrow  Delia Heady, MD Medical Director [facility] Pager: [phone] 04/29/2023 4:09 PM     To contact Stroke Continuity provider, please refer to [email or web address]. After hours, contact General Neurology

## 2023-04-29 NOTE — TOC CAGE-AID Note (Signed)
Transition of Care [facility]) - CAGE-AID Screening   Patient Details  Name: Casey Rangel MRN: [id number] Date of Birth: 05/26/1951   Hewitt Shorts, RN Trauma Response Nurse Phone Number: [phone] 04/29/2023, 5:31 PM      CAGE-AID Screening: Substance Abuse Screening unable to be completed due to: : Patient unable to participate (Hx of dementia and aphasia)

## 2023-04-30 ENCOUNTER — Encounter (HOSPITAL_COMMUNITY)
Admission: EM | Disposition: A | Discharge status: Skilled Nursing Facility | Payer: Self-pay | Source: Home / Self Care | Attending: Neurology

## 2023-04-30 ENCOUNTER — Inpatient Hospital Stay (HOSPITAL_COMMUNITY): Payer: Medicare Other | Admitting: Certified Registered"

## 2023-04-30 DIAGNOSIS — I69398 Other sequelae of cerebral infarction: Secondary | ICD-10-CM

## 2023-04-30 DIAGNOSIS — I5041 Acute combined systolic (congestive) and diastolic (congestive) heart failure: Secondary | ICD-10-CM

## 2023-04-30 DIAGNOSIS — Z431 Encounter for attention to gastrostomy: Secondary | ICD-10-CM

## 2023-04-30 DIAGNOSIS — I110 Hypertensive heart disease with heart failure: Secondary | ICD-10-CM

## 2023-04-30 HISTORY — PX: ESOPHAGOGASTRODUODENOSCOPY (EGD) WITH PROPOFOL: SHX5813

## 2023-04-30 HISTORY — PX: PEG PLACEMENT: SHX5437

## 2023-04-30 LAB — GLUCOSE, CAPILLARY
Glucose-Capillary: 94 mg/dL (ref 70–99)
Glucose-Capillary: 94 mg/dL (ref 70–99)
Glucose-Capillary: 77 mg/dL (ref 70–99)
Glucose-Capillary: 82 mg/dL (ref 70–99)
Glucose-Capillary: 85 mg/dL (ref 70–99)

## 2023-04-30 SURGERY — ESOPHAGOGASTRODUODENOSCOPY (EGD) WITH PROPOFOL
Anesthesia: Monitor Anesthesia Care

## 2023-04-30 MED ORDER — ROSUVASTATIN CALCIUM 20 MG PO TABS
20.0000 mg | ORAL_TABLET | Freq: Every day | ORAL | Status: DC
  Administered 2023-04-30 – 2023-05-02 (×3): 20 mg
  Filled 2023-04-30 (×3): qty 1

## 2023-04-30 MED ORDER — FREE WATER
145.0000 mL | Status: DC
  Administered 2023-04-30 – 2023-05-02 (×13): 145 mL

## 2023-04-30 MED ORDER — DONEPEZIL HCL 10 MG PO TABS
5.0000 mg | ORAL_TABLET | Freq: Every day | ORAL | Status: DC
  Administered 2023-04-30 – 2023-05-01 (×2): 5 mg
  Filled 2023-04-30 (×2): qty 1

## 2023-04-30 MED ORDER — SENNOSIDES-DOCUSATE SODIUM 8.6-50 MG PO TABS: 1.0000 | ORAL_TABLET | Freq: Every evening | ORAL | Status: DC | PRN

## 2023-04-30 MED ORDER — POTASSIUM CHLORIDE 20 MEQ PO PACK
40.0000 meq | PACK | Freq: Every day | ORAL | Status: DC
  Administered 2023-04-30 – 2023-05-02 (×3): 40 meq
  Filled 2023-04-30 (×3): qty 2

## 2023-04-30 MED ORDER — THIAMINE MONONITRATE 100 MG PO TABS
100.0000 mg | ORAL_TABLET | Freq: Every day | ORAL | Status: DC
  Administered 2023-04-30 – 2023-05-02 (×3): 100 mg
  Filled 2023-04-30 (×3): qty 1

## 2023-04-30 MED ORDER — TICAGRELOR 90 MG PO TABS
90.0000 mg | ORAL_TABLET | Freq: Two times a day (BID) | ORAL | Status: DC
  Administered 2023-04-30 – 2023-05-02 (×4): 90 mg
  Filled 2023-04-30 (×4): qty 1

## 2023-04-30 MED ORDER — CEFAZOLIN SODIUM-DEXTROSE 2-4 GM/100ML-% IV SOLN
2.0000 g | Freq: Once | INTRAVENOUS | Status: AC
  Administered 2023-04-30: 2 g via INTRAVENOUS
  Filled 2023-04-30: qty 100

## 2023-04-30 MED ORDER — OSMOLITE 1.2 CAL PO LIQD
1000.0000 mL | ORAL | Status: DC
  Administered 2023-04-30 – 2023-05-01 (×2): 1000 mL

## 2023-04-30 MED ORDER — PROPOFOL 500 MG/50ML IV EMUL
INTRAVENOUS | Status: DC | PRN
  Administered 2023-04-30: 100 ug/kg/min via INTRAVENOUS

## 2023-04-30 MED ORDER — MEGESTROL ACETATE 400 MG/10ML PO SUSP
400.0000 mg | Freq: Every day | ORAL | Status: DC
  Administered 2023-04-30 – 2023-05-02 (×3): 400 mg
  Filled 2023-04-30 (×3): qty 10

## 2023-04-30 MED ORDER — LACTATED RINGERS IV SOLN: INTRAVENOUS | Status: DC

## 2023-04-30 MED ORDER — PROPOFOL 10 MG/ML IV BOLUS
INTRAVENOUS | Status: DC | PRN
  Administered 2023-04-30: 60 mg via INTRAVENOUS
  Administered 2023-04-30: 20 mg via INTRAVENOUS

## 2023-04-30 MED ORDER — PROSOURCE TF20 ENFIT COMPATIBL EN LIQD
60.0000 mL | Freq: Every day | ENTERAL | Status: DC
  Administered 2023-04-30 – 2023-05-02 (×3): 60 mL
  Filled 2023-04-30 (×3): qty 60

## 2023-04-30 MED ORDER — CARVEDILOL 3.125 MG PO TABS
3.1250 mg | ORAL_TABLET | Freq: Two times a day (BID) | ORAL | Status: DC
  Administered 2023-04-30 – 2023-05-02 (×4): 3.125 mg
  Filled 2023-04-30 (×4): qty 1

## 2023-04-30 MED ORDER — QUETIAPINE FUMARATE 25 MG PO TABS
12.5000 mg | ORAL_TABLET | Freq: Two times a day (BID) | ORAL | Status: DC
  Administered 2023-04-30 – 2023-05-02 (×5): 12.5 mg
  Filled 2023-04-30 (×5): qty 1

## 2023-04-30 NOTE — Progress Notes (Signed)
Nutrition Brief Note:   MD consult to initiate EN via PEG today. MD had placed TF orders in and RD updated advancement rate due to risk of refeeding. RD also added prosource and free water flushes.   Please see most recent note from 04/28/23 for full assessment.   Following TF regimen has been ordered:   - Osmolite 1.5 at 50 ml/h (1200 ml per day) - Start at 20 mL/hour and advance by 10 mL/hour every 12 hours to goal rate of 50 mL/hour  - Add Prosource TF20 60 ml 1x/d - free water flush of 145 mL every 4 hours  - Provides 1880 kcal, 95 gm protein, 912 ml free water daily (TF+flush = of free water) - 100 mg of Thiamine per tube has been added.   RD will continue to monitor TF advancement and tolerance.   Bethann Humble, RD, LDN, CNSC.

## 2023-04-30 NOTE — Progress Notes (Addendum)
STROKE TEAM PROGRESS NOTE   BRIEF HPI Casey Rangel is a 72 y.o. 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 Underwent mechanic thrombectomy of L MCA M1 (TICI 3) complicated by contrast stain in posterior L putamen.   SIGNIFICANT HOSPITAL EVENTS 8/29: mechanic thrombectomy of L MCA M1 (TICI 3) requiring rescue left MCA angioplasty and stenting complicated by contrast stain L sylvian fissure 8/31 - pulled cortrak tube -> NG inserted 9/1 - pulled NG tube -> NG reinserted -> pulled 9/2: speech eval, able to take meds crushed with applesauce 9/8: Seroquel and Megace started. Unable to have planned family meeting. 9/9: unable to have family meeting, sister unable to come to hospital  INTERIM HISTORY/SUBJECTIVE No family at bedside.  PEG tube placed today. Restart TF today Patient is laying in bed in NAD.  No new neurological events overnight  Neurological exam is unchanged and stable. Will need to find SNF placement    OBJECTIVE  CBC    Component Value Date/Time   WBC 9.1 04/29/2023 0740   RBC 4.70 04/29/2023 0740   HGB 14.1 04/29/2023 0740   HCT 41.5 04/29/2023 0740   PLT 340 04/29/2023 0740   MCV 88.3 04/29/2023 0740   MCH 30.0 04/29/2023 0740   MCHC 34.0 04/29/2023 0740   RDW 13.0 04/29/2023 0740   LYMPHSABS 1.0 04/17/2023 0552   MONOABS 0.8 04/17/2023 0552   EOSABS 0.0 04/17/2023 0552   BASOSABS 0.0 04/17/2023 0552    BMET    Component Value Date/Time   NA 138 04/29/2023 0740   K 4.0 04/29/2023 0740   CL 109 04/29/2023 0740   CO2 19 (L) 04/29/2023 0740   GLUCOSE 77 04/29/2023 0740   BUN 18 04/29/2023 0740   CREATININE 0.89 04/29/2023 0740   CALCIUM 8.5 (L) 04/29/2023 0740   GFRNONAA >60 04/29/2023 0740   IMAGING past 24 hours No results found.  Vitals:   04/30/23 0940  04/30/23 0942 04/30/23 0957 04/30/23 1125  BP: 111/66 111/66 117/67 (!) 150/85  Pulse:    (!) 52  Resp:  19 15 19   Temp:    97.7 F (36.5 C)  TempSrc:    Oral  SpO2: 95% 95% 95% 100%  Weight:      Height:        PHYSICAL EXAM General: intubated CV: Regular rate and rhythm on monitor Respiratory:  synchronous with ventilator GI: Abdomen soft and nontender  NEURO:  Neuro -, eyes open, global aphasia, nonverbal and not following verbal commands, not able to pantomime, not able to repeat or name. Left gaze preference but able to have right gaze, blinking to visual threat on the left but not on right. Right facial droop. Tongue protrusion not cooperative.  LUE 4+/5 and RUE 4/5 with slight pronator drift. LLE 3/5 and RLE 3/5, but withdraw less to pain on the right than left.  Sensation, coordination and gait not tested.   ASSESSMENT/PLAN  Acute Ischemic Infarct:  left MCA scattered infarcts due to left M2 MCA occlusion s/p IR with TICI3 and left M2 stenting, etiology likely large vessel disease  Code Stroke CT head Acute L MCA territory infarct involving L insula. Small vessel disease. ASPECTS 9.   CTA head & neck Acute occlusion of a proximal left M2, left M1 moderate stenosis, right ICA bulb ulcerated plaque, bilateral  ICA bulb atherosclerosis. CT perfusion 45/91 S/p IR with TICI3 and left M2 stenting CT head s/p stent L MCA, Hyperdense material in the left Sylvian fissure, likely extravasated contrast. MRI  Multifocal acute ischemia within the left MCA territory. No hemorrhage or mass effect. 2D Echo EF 45-50%. Global hypokinesis of LV LDL 132 HgbA1c 5.2 VTE prophylaxis - lovenox 40 aspirin 81 mg daily prior to admission, now on aspirin 81 mg daily and Brilinta (ticagrelor) 90 mg bid for stent.  (Brilinta stopped today in preparation for PEG) Therapy recommendations:  SNF Disposition:  pending  Hx of Stroke/TIA 01/2022 admitted for stroke. CT showed old left parietal occipital,  right PCA and right MCA/ACA infarcts.  CTA head and neck left ICA terminus string sign and possible occlusion.  CTP positive penumbra.  Status post IR with TICI3. MRI showed left patchy MCA infarct. MRA showed left ICA terminus patent but left M1 moderate stenosis.  EF 35 to 40%, LDL 129, A1c 5.1.  Patient discharged on DAPT and Crestor 20. 30-day cardiac event monitoring as outpatient showed no active but only has 7 days of data.  Hypertension HFrEF Home meds:  coreg 3.125 twice daily, entresto 24-26 twice daily Stable On Coreg 3.125 Long-term BP goal normotensive  Hyperlipidemia Home meds:  none LDL 132, goal < 70 On crestor 20 Continue statin at discharge  Agitation  Start seroquel 12.5 mg   Dysphagia Currently on dysphagia 1 diet with honey thick liquids Able to take medications with applesauce Pulled off cortrak-> NG placed -> pulled off NG -> NG re-inserted->pulled off again Coretrak placed 9/6. Pulled out again 9/7. PEG placed on Thursday  Other Stroke Risk Factors ETOH use, alcohol level <10, advised to drink no more than 2 drink(s) a day Congestive heart failure Former smoker  Other Active Problems Leukocytosis, improved: WBC 11.8--11.1--10.8-9.8 -> 7.6 AKI, creatinine 1.22--1.04--0.99-1.05 -> 0.81 Labs ordered [facility]  Hospital day # 14   Pt seen by Neuro NP/APP and later by MD. Note/plan to be edited by MD as needed.    Gevena Mart DNP, ACNPC-AG  [facility]  I have personally obtained history,examined this patient, reviewed notes, independently viewed imaging studies, participated in medical decision making and plan of care.ROS completed by me personally and pertinent positives fully documented  I have made any additions or clarifications directly to the above note. Agree with note above.  Patient had PEG tube today.  Procedure uneventful.  Hopefully transfer to SNF in next few  days   Delia Heady, MD Medical Director [facility] Pager: [phone] 04/30/2023 5:43 PM      To contact Stroke Continuity provider, please refer to [email or web address]. After hours, contact General Neurology

## 2023-04-30 NOTE — Anesthesia Preprocedure Evaluation (Addendum)
Anesthesia Evaluation  Patient identified by MRN, date of birth, ID band Patient unresponsive    Reviewed: Allergy & Precautions, NPO status , Patient's Chart, lab work & pertinent test results, reviewed documented beta blocker date and time   History of Anesthesia Complications Negative for: history of anesthetic complications  Airway Mallampati: Unable to assess       Dental  (+) Poor Dentition   Pulmonary former smoker    + decreased breath sounds      Cardiovascular hypertension,  Rhythm:Regular Rate:Normal     Neuro/Psych neg Headaches, neg Seizures PSYCHIATRIC DISORDERS     Dementia  Neuromuscular disease CVA, Residual Symptoms    GI/Hepatic hiatal hernia,GERD  ,,(+) neg Cirrhosis        Endo/Other  neg diabetes    Renal/GU Renal disease     Musculoskeletal   Abdominal   Peds  Hematology   Anesthesia Other Findings   Reproductive/Obstetrics                             Anesthesia Physical Anesthesia Plan  ASA: 4  Anesthesia Plan: MAC   Post-op Pain Management:    Induction:   PONV Risk Score and Plan: 1 and Propofol infusion  Airway Management Planned:   Additional Equipment:   Intra-op Plan:   Post-operative Plan:   Informed Consent: I have reviewed the patients History and Physical, chart, labs and discussed the procedure including the risks, benefits and alternatives for the proposed anesthesia with the patient or authorized representative who has indicated his/her understanding and acceptance.    Suspend DNR.   Consent reviewed with POA  Plan Discussed with: CRNA  Anesthesia Plan Comments:        Anesthesia Quick Evaluation

## 2023-04-30 NOTE — Transfer of Care (Signed)
Immediate Anesthesia Transfer of Care Note  Patient: Casey Rangel  Procedure(s) Performed: ESOPHAGOGASTRODUODENOSCOPY (EGD) WITH PROPOFOL PERCUTANEOUS ENDOSCOPIC GASTROSTOMY (PEG) PLACEMENT  Patient Location: PACU and Endoscopy Unit  Anesthesia Type:MAC  Level of Consciousness: confused, lethargic, and responds to stimulation.  Pt with pre-existing neurologic sequelae unchanged after anesthesia.  Airway & Oxygen Therapy: Patient Spontanous Breathing and Patient connected to nasal cannula oxygen  Post-op Assessment: Report given to RN and Post -op Vital signs reviewed and stable  Post vital signs: Reviewed and stable  Last Vitals:  Vitals Value Taken Time  BP 117/67 04/30/23 0957  Temp 36.4 C 04/30/23 0939  Pulse 51 04/30/23 0958  Resp 17 04/30/23 0958  SpO2 95 % 04/30/23 0958  Vitals shown include unfiled device data.  Last Pain:  Vitals:   04/30/23 0957  TempSrc:   PainSc: 0-No pain         Complications: No notable events documented.

## 2023-04-30 NOTE — Plan of Care (Signed)
  Problem: Pain Managment: Goal: General experience of comfort will improve Outcome: Progressing   Problem: Safety: Goal: Ability to remain free from injury will improve Outcome: Progressing   Problem: Skin Integrity: Goal: Risk for impaired skin integrity will decrease Outcome: Progressing   

## 2023-04-30 NOTE — Anesthesia Postprocedure Evaluation (Signed)
Anesthesia Post Note  Patient: Casey Rangel  Procedure(s) Performed: ESOPHAGOGASTRODUODENOSCOPY (EGD) WITH PROPOFOL PERCUTANEOUS ENDOSCOPIC GASTROSTOMY (PEG) PLACEMENT     Patient location during evaluation: PACU Anesthesia Type: MAC Level of consciousness: awake and alert Pain management: pain level controlled Vital Signs Assessment: post-procedure vital signs reviewed and stable Respiratory status: spontaneous breathing, nonlabored ventilation, respiratory function stable and patient connected to nasal cannula oxygen Cardiovascular status: stable and blood pressure returned to baseline Postop Assessment: no apparent nausea or vomiting Anesthetic complications: no   No notable events documented.  Last Vitals:  Vitals:   04/30/23 0957 04/30/23 1125  BP: 117/67 (!) 150/85  Pulse:  (!) 52  Resp: 15 19  Temp:  36.5 C  SpO2: 95% 100%    Last Pain:  Vitals:   04/30/23 1125  TempSrc: Oral  PainSc:                  Mariann Barter

## 2023-04-30 NOTE — Progress Notes (Signed)
Patient ID: Casey Rangel, male   DOB: 09-Mar-1951, 72 y.o.   MRN: [id number] Day of Surgery    Subjective: In Endo, non-verbal ROS negative except as listed above. Objective: Vital signs in last 24 hours: Temp:  [97.5 F (36.4 C)-98.9 F (37.2 C)] 97.5 F (36.4 C) (09/12 0822) Pulse Rate:  [47-62] 47 (09/12 0822) Resp:  [14-19] 14 (09/12 0822) BP: (125-148)/(63-91) 128/76 (09/12 0822) SpO2:  [97 %-100 %] 99 % (09/12 0822) Last BM Date : 04/29/23  Intake/Output from previous day: 09/11 0701 - 09/12 0700 In: 1005.5 [I.V.:1005.5] Out: 500 [Urine:500] Intake/Output this shift: No intake/output data recorded.  General appearance: no distress Resp: clear to auscultation bilaterally GI: soft, NT Neuro: non-verbal  Lab Results: CBC  Recent Labs    04/29/23 0740  WBC 9.1  HGB 14.1  HCT 41.5  PLT 340   BMET Recent Labs    04/29/23 0740  NA 138  K 4.0  CL 109  CO2 19*  GLUCOSE 77  BUN 18  CREATININE 0.89  CALCIUM 8.5*   PT/INR No results for input(s): "LABPROT", "INR" in the last 72 hours. ABG No results for input(s): "PHART", "HCO3" in the last 72 hours.  Invalid input(s): "PCO2", "PO2"  Studies/Results: No results found.  Anti-infectives: Anti-infectives (From admission, onward)    None       Assessment/Plan: CVA with dysphagia - for PEG. Procedure, risks, and benefits have been D/W his sister. Consent documented.   LOS: 14 days    Violeta Gelinas, MD, MPH, FACS Trauma & General Surgery Use [email or web address] to contact on call provider  04/30/2023

## 2023-04-30 NOTE — Progress Notes (Signed)
SLP Cancellation Note  Patient Details Name: Casey Rangel MRN: [id number] DOB: June 19, 1951   Cancelled treatment:       Reason Eval/Treat Not Completed: Patient at procedure or test/unavailable   Pat Shawanna Zanders,M.S.,CCC-SLP 04/30/2023, 12:45 PM

## 2023-04-30 NOTE — Op Note (Signed)
[facility] Patient Name: Casey Rangel Procedure Date : 04/30/2023 MRN: [id number] Attending MD: Violeta Gelinas , MD,  Date of Birth: 03/18/1951 CSN: [id number] Age: 72 Admit Type: Inpatient Procedure:                Upper GI endoscopy Indications:              Place PEG because patient is unable to eat due to                            stroke (CVA) Providers:                Violeta Gelinas, MD, Trixie Deis, PA-C, Weston Settle, RN, Priscella Mann, Technician Referring MD:             Pearlean Brownie Medicines:                Propofol per Anesthesia Complications:            No immediate complications. Estimated Blood Loss:     Estimated blood loss was minimal. Procedure:                Pre-Anesthesia Assessment:                           - Prior to the procedure, a History and Physical                            was performed, and patient medications and                            allergies were reviewed. The patient is unable to                            give consent secondary to the patient's altered                            mental status. The risks and benefits of the                            procedure and the sedation options and risks were                            discussed with the patient's sister. All questions                            were answered and informed consent was obtained.                            Patient identification and proposed procedure were                            verified by the physician, the nurse and the  anesthetist in the procedure room. Mental Status                            Examination: lethargic. ASA Grade Assessment: III -                            A patient with severe systemic disease. After                            reviewing the risks and benefits, the patient was                            deemed in satisfactory condition to undergo the                             procedure. The anesthesia plan was to use monitored                            anesthesia care (MAC). Immediately prior to                            administration of medications, the patient was                            re-assessed for adequacy to receive sedatives. The                            heart rate, respiratory rate, oxygen saturations,                            blood pressure, adequacy of pulmonary ventilation,                            and response to care were monitored throughout the                            procedure. The physical status of the patient was                            re-assessed after the procedure.                           After obtaining informed consent, the endoscope was                            passed under direct vision. Throughout the                            procedure, the patient's blood pressure, pulse, and                            oxygen saturations were monitored continuously. The  [id number]-[id number] ([id number]) Olympus endoscope was introduced                            through the mouth, and advanced to the duodenal                            bulb. The upper GI endoscopy was somewhat difficult                            due to unusual anatomy. Successful completion of                            the procedure was aided by persistance. Silk                            pursestring suture placed at site for oozing. Scope In: Scope Out: Findings:      No gross lesions were noted in the esophagus.      No gross lesions were noted in the stomach. Placement of an externally       removable PEG with no T-fasteners was successfully completed. The       external bumper was at the 4.0 cm marking on the tube. Impression:               - No gross lesions in the esophagus.                           - No gross lesions in the stomach.                           - An externally removable PEG placement was                             successfully completed.                           - No specimens collected. Recommendation:           - Please follow the post-PEG recommendations. Procedure Code(s):        --- Professional ---                           [phone], Esophagogastroduodenoscopy, flexible,                            transoral; with directed placement of percutaneous                            gastrostomy tube Diagnosis Code(s):        --- Professional ---                           L24.401, Other sequelae of cerebral infarction                           Z43.1, Encounter for attention to gastrostomy CPT copyright 2022 [employer]. All rights reserved. The codes  documented in this report are preliminary and upon coder review may  be revised to meet current compliance requirements. Violeta Gelinas, MD 04/30/2023 9:45:02 AM This report has been signed electronically. Number of Addenda: 0

## 2023-04-30 NOTE — Progress Notes (Signed)
Occupational Therapy Treatment Patient Details Name: Casey Rangel MRN: [id number] DOB: Jun 21, 1951 Today's Date: 04/30/2023   History of present illness Pt is a 72 y/o male presenting on 8/29 with L gaze, R sided weakness, and global aphasia. CT with concern for L MCA CVA, CT angiogram with occlusion of proximal L MCA M2. S/P thrombectomy 8/29. PMH L hip fx ; T9-11 fusion, L MCA CVA with residual aphasia, recent small volume SDH 01/2023., alzheimer's dementia.   OT comments  Patient received in supine and was found to have had bowel incontinence in bed. Patient demonstrating gains with following directions for bed mobility with min assist to get to EOB and able to ambulate to sink to perform LB bathing, grooming, and gown change. Patient able to participate more with grooming tasks and following directions >50% of time. Patient transferred to recliner and attempted self feeding with hand over hand to initiate and mod assist. Patient left in recliner with nursing. Patient will benefit from continued inpatient follow up therapy, <3 hours/day to continue to address cognition and self care. Acute OT to continue to follow.       If plan is discharge home, recommend the following:  A lot of help with bathing/dressing/bathroom;Assistance with cooking/housework;Direct supervision/assist for medications management;Direct supervision/assist for financial management;Assist for transportation;Help with stairs or ramp for entrance;A lot of help with walking and/or transfers   Equipment Recommendations  Other (comment) (defer)    Recommendations for Other Services      Precautions / Restrictions Precautions Precautions: Fall Precaution Comments: aphasia Required Braces or Orthoses: Other Brace Other Brace: abdominal binder, mittens restraints Restrictions Weight Bearing Restrictions: No       Mobility Bed Mobility Overal bed mobility: Needs Assistance Bed Mobility: Supine to Sit     Supine to  sit: Min assist, HOB elevated, Used rails     General bed mobility comments: following one step commands for rail use and getting legs off EOB    Transfers Overall transfer level: Needs assistance Equipment used: Rolling walker (2 wheels) Transfers: Sit to/from Stand Sit to Stand: Min assist           General transfer comment: min assist to stand from EOB to RW and min assist to ambulate to sink for self care and sat at chiar for seated rest. Ambualted to recliner with min assist and required cues for hand placement     Balance Overall balance assessment: Needs assistance Sitting-balance support: Feet supported, No upper extremity supported Sitting balance-Leahy Scale: Fair Sitting balance - Comments: supervision static sitting EOB   Standing balance support: Single extremity supported, Bilateral upper extremity supported, During functional activity Standing balance-Leahy Scale: Poor Standing balance comment: reliant on RW or sink for support                           ADL either performed or assessed with clinical judgement   ADL Overall ADL's : Needs assistance/impaired Eating/Feeding: Moderate assistance;Sitting Eating/Feeding Details (indicate cue type and reason): hand over hand to intiate and cues to swallow, possible pocketing Grooming: Wash/dry hands;Wash/dry face;Brushing hair;Minimal assistance;Standing Grooming Details (indicate cue type and reason): able to turn on water and wash hands and comb hair, required assistance to complete     Lower Body Bathing: Maximal assistance;Sit to/from stand Lower Body Bathing Details (indicate cue type and reason): assisted with cleaning following bowel incontinence in bed Upper Body Dressing : Moderate assistance;Sitting Upper Body Dressing Details (indicate cue type and  reason): to change gown                   General ADL Comments: improvement with following directions    Extremity/Trunk Assessment               Vision       Perception     Praxis      Cognition Arousal: Alert Behavior During Therapy: Flat affect Overall Cognitive Status: No family/caregiver present to determine baseline cognitive functioning                                 General Comments: improvement on following directions >50% of time, non verbal but attempted to use hand gestures        Exercises      Shoulder Instructions       General Comments      Pertinent Vitals/ Pain       Pain Assessment Pain Assessment: Faces Faces Pain Scale: No hurt  Home Living                                          Prior Functioning/Environment              Frequency  Min 1X/week        Progress Toward Goals  OT Goals(current goals can now be found in the care plan section)  Progress towards OT goals: Progressing toward goals  Acute Rehab OT Goals OT Goal Formulation: Patient unable to participate in goal setting Time For Goal Achievement: 05/01/23 Potential to Achieve Goals: Fair ADL Goals Pt Will Perform Grooming: with min assist;sitting Pt Will Perform Upper Body Bathing: with min assist;sitting Pt Will Perform Lower Body Dressing: with mod assist;sit to/from stand;sitting/lateral leans Pt Will Transfer to Toilet: with mod assist;ambulating Additional ADL Goal #1: Pt will be follow 1 step commands with 90% accuracy during ADLs. Additional ADL Goal #2: Pt will scan to R side with minimal cueing.  Plan      Co-evaluation                 AM-PAC OT "6 Clicks" Daily Activity     Outcome Measure   Help from another person eating meals?: A Lot Help from another person taking care of personal grooming?: A Lot Help from another person toileting, which includes using toliet, bedpan, or urinal?: A Lot Help from another person bathing (including washing, rinsing, drying)?: A Lot Help from another person to put on and taking off regular upper body  clothing?: A Lot Help from another person to put on and taking off regular lower body clothing?: A Lot 6 Click Score: 12    End of Session Equipment Utilized During Treatment: Gait belt;Rolling walker (2 wheels)  OT Visit Diagnosis: Unsteadiness on feet (R26.81);Muscle weakness (generalized) (M62.81) Symptoms and signs involving cognitive functions: Cerebral infarction Hemiplegia - Right/Left: Right Hemiplegia - caused by: Cerebral infarction   Activity Tolerance Patient tolerated treatment well   Patient Left in chair;with call bell/phone within reach;with chair alarm set;with nursing/sitter in room   Nurse Communication Mobility status        Time: 1350-1415 OT Time Calculation (min): 25 min  Charges: OT General Charges $OT Visit: 1 Visit OT Treatments $Self Care/Home Management : 23-37 mins  Alfonse Flavors, OTA Acute Rehabilitation Services  Office [phone]   Dewain Penning 04/30/2023, 2:28 PM

## 2023-04-30 NOTE — Plan of Care (Signed)
  Problem: Education: Goal: Knowledge of disease or condition will improve Outcome: Not Progressing Goal: Knowledge of secondary prevention will improve (MUST DOCUMENT ALL) Outcome: Not Progressing Goal: Knowledge of patient specific risk factors will improve Loraine Leriche N/A or DELETE if not current risk factor) Outcome: Not Progressing   Problem: Ischemic Stroke/TIA Tissue Perfusion: Goal: Complications of ischemic stroke/TIA will be minimized Outcome: Not Progressing   Problem: Coping: Goal: Will verbalize positive feelings about self Outcome: Not Progressing Goal: Will identify appropriate support needs Outcome: Not Progressing   Problem: Health Behavior/Discharge Planning: Goal: Ability to manage health-related needs will improve Outcome: Not Progressing Goal: Goals will be collaboratively established with patient/family Outcome: Not Progressing   Problem: Self-Care: Goal: Ability to participate in self-care as condition permits will improve Outcome: Not Progressing Goal: Verbalization of feelings and concerns over difficulty with self-care will improve Outcome: Not Progressing Goal: Ability to communicate needs accurately will improve Outcome: Not Progressing   Problem: Nutrition: Goal: Risk of aspiration will decrease Outcome: Not Progressing Goal: Dietary intake will improve Outcome: Not Progressing   Problem: Education: Goal: Understanding of CV disease, CV risk reduction, and recovery process will improve Outcome: Not Progressing Goal: Individualized Educational Video(s) Outcome: Not Progressing   Problem: Activity: Goal: Ability to return to baseline activity level will improve Outcome: Not Progressing   Problem: Cardiovascular: Goal: Ability to achieve and maintain adequate cardiovascular perfusion will improve Outcome: Not Progressing Goal: Vascular access site(s) Level 0-1 will be maintained Outcome: Not Progressing   Problem: Health Behavior/Discharge  Planning: Goal: Ability to safely manage health-related needs after discharge will improve Outcome: Not Progressing   Problem: Education: Goal: Knowledge of General Education information will improve Description: Including pain rating scale, medication(s)/side effects and non-pharmacologic comfort measures Outcome: Not Progressing   Problem: Health Behavior/Discharge Planning: Goal: Ability to manage health-related needs will improve Outcome: Not Progressing   Problem: Clinical Measurements: Goal: Ability to maintain clinical measurements within normal limits will improve Outcome: Not Progressing Goal: Will remain free from infection Outcome: Not Progressing Goal: Diagnostic test results will improve Outcome: Not Progressing Goal: Respiratory complications will improve Outcome: Not Progressing Goal: Cardiovascular complication will be avoided Outcome: Not Progressing   Problem: Activity: Goal: Risk for activity intolerance will decrease Outcome: Not Progressing   Problem: Nutrition: Goal: Adequate nutrition will be maintained Outcome: Not Progressing   Problem: Elimination: Goal: Will not experience complications related to bowel motility Outcome: Not Progressing Goal: Will not experience complications related to urinary retention Outcome: Not Progressing   Problem: Pain Managment: Goal: General experience of comfort will improve Outcome: Not Progressing   Problem: Skin Integrity: Goal: Risk for impaired skin integrity will decrease Outcome: Progressing

## 2023-05-01 LAB — GLUCOSE, CAPILLARY
Glucose-Capillary: 101 mg/dL — ABNORMAL HIGH (ref 70–99)
Glucose-Capillary: 103 mg/dL — ABNORMAL HIGH (ref 70–99)
Glucose-Capillary: 104 mg/dL — ABNORMAL HIGH (ref 70–99)
Glucose-Capillary: 122 mg/dL — ABNORMAL HIGH (ref 70–99)
Glucose-Capillary: 136 mg/dL — ABNORMAL HIGH (ref 70–99)
Glucose-Capillary: 93 mg/dL (ref 70–99)
Glucose-Capillary: 98 mg/dL (ref 70–99)

## 2023-05-01 LAB — BASIC METABOLIC PANEL
Anion gap: 12 (ref 5–15)
BUN: 29 mg/dL — ABNORMAL HIGH (ref 8–23)
CO2: 20 mmol/L — ABNORMAL LOW (ref 22–32)
Calcium: 8.5 mg/dL — ABNORMAL LOW (ref 8.9–10.3)
Chloride: 109 mmol/L (ref 98–111)
Creatinine, Ser: 0.82 mg/dL (ref 0.61–1.24)
GFR, Estimated: >60 mL/min (ref >60)
Glucose, Bld: 48 mg/dL — ABNORMAL LOW (ref 70–99)
Potassium: 4.2 mmol/L (ref 3.5–5.1)
Sodium: 141 mmol/L (ref 135–145)

## 2023-05-01 LAB — CBC
HCT: 36.3 % — ABNORMAL LOW (ref 39.0–52.0)
Hemoglobin: 12.6 g/dL — ABNORMAL LOW (ref 13.0–17.0)
MCH: 31.3 pg (ref 26.0–34.0)
MCHC: 34.7 g/dL (ref 30.0–36.0)
MCV: 90.3 fL (ref 80.0–100.0)
Platelets: 312 10*3/uL (ref 150–400)
RBC: 4.02 MIL/uL — ABNORMAL LOW (ref 4.22–5.81)
RDW: 13.2 % (ref 11.5–15.5)
WBC: 9.2 10*3/uL (ref 4.0–10.5)
nRBC: 0 % (ref 0.0–0.2)

## 2023-05-01 NOTE — Progress Notes (Signed)
   Progress Note  1 Day Post-Op  Subjective: Pt non-verbal. Tolerating TF  Objective: Vital signs in last 24 hours: Temp:  [97.5 F (36.4 C)-98.1 F (36.7 C)] 97.8 F (36.6 C) (09/13 0821) Pulse Rate:  [52-92] 80 (09/13 0821) Resp:  [17-19] 18 (09/13 0821) BP: (119-150)/(65-85) 136/65 (09/13 0821) SpO2:  [97 %-100 %] 99 % (09/13 0821) Last BM Date : 04/30/23  Intake/Output from previous day: 09/12 0701 - 09/13 0700 In: 1569 [I.V.:268.2; NG/GT:1300.8] Out: 1205 [Urine:1200; Blood:5] Intake/Output this shift: Total I/O In: 2192.2 [I.V.:1458.9; NG/GT:733.3] Out: -   PE: General: WD, chronically ill appearing male who is laying in bed in NAD Heart: regular, rate, and rhythm.   Lungs: Respiratory effort nonlabored Abd: soft, NT, ND, PEG present with no bleeding, binder around loosely, suture present underneath bumper    Lab Results:  Recent Labs    04/29/23 0740  WBC 9.1  HGB 14.1  HCT 41.5  PLT 340   BMET Recent Labs    04/29/23 0740  NA 138  K 4.0  CL 109  CO2 19*  GLUCOSE 77  BUN 18  CREATININE 0.89  CALCIUM 8.5*   PT/INR No results for input(s): "LABPROT", "INR" in the last 72 hours. CMP     Component Value Date/Time   NA 138 04/29/2023 0740   K 4.0 04/29/2023 0740   CL 109 04/29/2023 0740   CO2 19 (L) 04/29/2023 0740   GLUCOSE 77 04/29/2023 0740   BUN 18 04/29/2023 0740   CREATININE 0.89 04/29/2023 0740   CALCIUM 8.5 (L) 04/29/2023 0740   PROT 6.2 (L) 04/17/2023 0552   ALBUMIN 3.2 (L) 04/17/2023 0552   AST 23 04/17/2023 0552   ALT 14 04/17/2023 0552   ALKPHOS 52 04/17/2023 0552   BILITOT 1.1 04/17/2023 0552   GFRNONAA >60 04/29/2023 0740   Lipase  No results found for: "LIPASE"     Studies/Results: No results found.  Anti-infectives: Anti-infectives (From admission, onward)    Start     Dose/Rate Route Frequency Ordered Stop   04/30/23 0900  ceFAZolin (ANCEF) IVPB 2g/100 mL premix        2 g 200 mL/hr over 30 Minutes  Intravenous  Once 04/30/23 0850 04/30/23 0912        Assessment/Plan  S/P PEG placement 9/12 - tolerating TF - PEG site clean without bleeding - remove pursestring suture from around PEG (under the bumper) in 1 week - trauma will sign off, please call if we can be of further assistance    LOS: 15 days     Juliet Rude, [facility] Surgery 05/01/2023, 11:17 AM Please see Amion for pager number during day hours 7:00am-4:30pm

## 2023-05-01 NOTE — Progress Notes (Signed)
Confirmed that assessment has not changed and this is the same as he has been, as he has global aphasia. He is unable to speak or make needs known, except he does, however pull on rails and bang on side rails as well.

## 2023-05-01 NOTE — TOC Progression Note (Signed)
Transition of Care [facility]) - Progression Note    Patient Details  Name: Casey Rangel MRN: [id number] Date of Birth: [birth year]  Transition of Care [facility]) CM/SW Contact  Baldemar Lenis, [place] Phone Number: 05/01/2023, 3:52 PM  Clinical Narrative:   CSW noting per chart review, patient received peg, feedings at goal. Patient received Haldol this morning, unable to discharge unless patient is 24 hours without restraints, including chemical restraints. CSW updated [facility], they have a bed available over the weekend if patient is able to stabilize. CSW to follow.    Expected Discharge Plan: Skilled Nursing Facility Barriers to Discharge: Continued Medical Work up, Facility will not accept until restraint criteria met  Expected Discharge Plan and Services In-house Referral: Clinical Social Work     Living arrangements for the past 2 months: Mobile Home                                       Social Determinants of Health (SDOH) Interventions SDOH Screenings   Food Insecurity: No Food Insecurity (04/02/2023)  Housing: Medium Risk (03/21/2022)  Transportation Needs: No Transportation Needs (04/02/2023)  Utilities: Not At Risk (04/02/2023)  Physical Activity: Inactive (03/12/2022)  Social Connections: Socially Isolated (03/12/2022)  Tobacco Use: Medium Risk (04/16/2023)    Readmission Risk Interventions     No data to display

## 2023-05-01 NOTE — Plan of Care (Signed)
  Problem: Nutrition: Goal: Risk of aspiration will decrease Outcome: Progressing   Problem: Activity: Goal: Risk for activity intolerance will decrease Outcome: Progressing   Problem: Coping: Goal: Level of anxiety will decrease Outcome: Progressing

## 2023-05-01 NOTE — Progress Notes (Addendum)
STROKE TEAM PROGRESS NOTE   BRIEF HPI Casey Rangel is a 72 y.o. 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 Underwent mechanic thrombectomy of L MCA M1 (TICI 3) complicated by contrast stain in posterior L putamen.   SIGNIFICANT HOSPITAL EVENTS 8/29: mechanic thrombectomy of L MCA M1 (TICI 3) requiring rescue left MCA angioplasty and stenting complicated by contrast stain L sylvian fissure 8/31 - pulled cortrak tube -> NG inserted 9/1 - pulled NG tube -> NG reinserted -> pulled 9/2: speech eval, able to take meds crushed with applesauce 9/8: Seroquel and Megace started. Unable to have planned family meeting. 9/9: unable to have family meeting, sister unable to come to hospital 9/12: PEG placed  INTERIM HISTORY/SUBJECTIVE No family at bedside.  Patient is laying in bed in NAD.  No new neurological or acute events overnight  Neurological exam is unchanged and stable. Pending SNF for placement   OBJECTIVE  CBC    Component Value Date/Time   WBC 9.1 04/29/2023 0740   RBC 4.70 04/29/2023 0740   HGB 14.1 04/29/2023 0740   HCT 41.5 04/29/2023 0740   PLT 340 04/29/2023 0740   MCV 88.3 04/29/2023 0740   MCH 30.0 04/29/2023 0740   MCHC 34.0 04/29/2023 0740   RDW 13.0 04/29/2023 0740   LYMPHSABS 1.0 04/17/2023 0552   MONOABS 0.8 04/17/2023 0552   EOSABS 0.0 04/17/2023 0552   BASOSABS 0.0 04/17/2023 0552    BMET    Component Value Date/Time   NA 138 04/29/2023 0740   K 4.0 04/29/2023 0740   CL 109 04/29/2023 0740   CO2 19 (L) 04/29/2023 0740   GLUCOSE 77 04/29/2023 0740   BUN 18 04/29/2023 0740   CREATININE 0.89 04/29/2023 0740   CALCIUM 8.5 (L) 04/29/2023 0740   GFRNONAA >60 04/29/2023 0740   IMAGING past 24 hours No results found.  Vitals:   04/30/23 1537 04/30/23 2038 04/30/23  2359 05/01/23 0821  BP: 129/72 119/74 123/69 136/65  Pulse: 90 90 92 80  Resp: 17 17 17 18   Temp: 97.9 F (36.6 C) 98.1 F (36.7 C) (!) 97.5 F (36.4 C) 97.8 F (36.6 C)  TempSrc: Oral Oral Oral Oral  SpO2: 99% 100% 97% 99%  Weight:      Height:        PHYSICAL EXAM General: intubated CV: Regular rate and rhythm on monitor Respiratory:  synchronous with ventilator GI: Abdomen soft and nontender  NEURO:  Neuro -, eyes open, global aphasia, occasional guttural sounds only and not following verbal commands, not able to pantomime, not able to repeat or name. Left gaze preference but able to have right gaze, blinking to visual threat on the left but not on right. Right facial droop. Tongue protrusion not cooperative.  LUE 4+/5 and RUE 4/5 with slight pronator drift. LLE 3/5 and RLE 3/5, but withdraw less to pain on the right than left.  Sensation, coordination and gait not tested.   ASSESSMENT/PLAN  Acute Ischemic Infarct:  left MCA scattered infarcts due to left M2 MCA occlusion s/p IR with TICI3 and left M2 stenting, etiology likely large vessel disease  Code Stroke CT head Acute L MCA territory infarct involving L insula. Small vessel disease. ASPECTS 9.   CTA head & neck Acute occlusion of a proximal left M2, left M1 moderate stenosis,  right ICA bulb ulcerated plaque, bilateral ICA bulb atherosclerosis. CT perfusion 45/91 S/p IR with TICI3 and left M2 stenting CT head s/p stent L MCA, Hyperdense material in the left Sylvian fissure, likely extravasated contrast. MRI  Multifocal acute ischemia within the left MCA territory. No hemorrhage or mass effect. 2D Echo EF 45-50%. Global hypokinesis of LV LDL 132 HgbA1c 5.2 VTE prophylaxis - lovenox 40 aspirin 81 mg daily prior to admission, now on aspirin 81 mg daily and Brilinta (ticagrelor) 90 mg bid for stent.   Therapy recommendations:  SNF Disposition:  pending  Hx of Stroke/TIA 01/2022 admitted for stroke. CT showed old left  parietal occipital, right PCA and right MCA/ACA infarcts.  CTA head and neck left ICA terminus string sign and possible occlusion.  CTP positive penumbra.  Status post IR with TICI3. MRI showed left patchy MCA infarct. MRA showed left ICA terminus patent but left M1 moderate stenosis.  EF 35 to 40%, LDL 129, A1c 5.1.  Patient discharged on DAPT and Crestor 20. 30-day cardiac event monitoring as outpatient showed no active but only has 7 days of data.  Hypertension HFrEF Home meds:  coreg 3.125 twice daily, entresto 24-26 twice daily Stable Coreg 3.125 Long-term BP goal normotensive  Hyperlipidemia Home meds:  none LDL 132, goal < 70 On crestor 20 Continue statin at discharge  Agitation  Seroquel 12.5 mg   Dysphagia Currently on dysphagia 1 diet with honey thick liquids Able to take medications with applesauce Pulled off cortrak-> NG placed -> pulled off NG -> NG re-inserted->pulled off again Coretrak placed 9/6. Pulled out again 9/7. PEG placed 9/12  Other Stroke Risk Factors ETOH use, alcohol level <10, advised to drink no more than 2 drink(s) a day Congestive heart failure Former smoker  Other Active Problems Leukocytosis, improved: WBC 11.8--11.1--10.8-9.8 -> 7.6 -> 9.1 AKI, creatinine 1.22--1.04--0.99-1.05 -> 0.81 -> 0.89 Labs ordered [facility]  Hospital day # 15   Pt seen by Neuro NP/APP and later by MD. Note/plan to be edited by MD as needed.    Lynnae January, DNP, AGACNP-BC [facility] Please use AMION for contact information & EPIC for messaging.  I have personally obtained history,examined this patient, reviewed notes, independently viewed imaging studies, participated in medical decision making and plan of care.ROS completed by me personally and pertinent positives fully documented  I have made any additions or clarifications directly to the above note. Agree with note above.  Patient has had a PEG tube and is tolerating tube feeds.  Remains globally  aphasic with making only guttural sounds with dense right hemiplegia.  Very purposeful movements on the left.  Medically stable to be transferred to skilled nursing facility when bed available.  Delia Heady, MD Medical Director [facility] Pager: [phone] 05/01/2023 1:39 PM      To contact Stroke Continuity provider, please refer to [email or web address]. After hours, contact General Neurology

## 2023-05-01 NOTE — Progress Notes (Signed)
Physical Therapy Treatment Patient Details Name: Gardner Hebel MRN: [id number] DOB: 1951/04/10 Today's Date: 05/01/2023   History of Present Illness Pt is a 72 y/o male presenting on 8/29 with L gaze, R sided weakness, and global aphasia. CT with concern for L MCA CVA, CT angiogram with occlusion of proximal L MCA M2. S/P thrombectomy 8/29. PEG placed 9/12. PMH L hip fx ; T9-11 fusion, L MCA CVA with residual aphasia, recent small volume SDH 01/2023., alzheimer's dementia.    PT Comments  Focused session on gait training. Pt remains non-verbal other than grumbling unintelligible speech 2x. He had difficulty following verbal cues but did well with initiating mobility when provided gestures. Pt tends to lean and drift to the R and display decreased L weight shift and stance time and thus decreased R step length when ambulating. Able to progress pt's R step length by providing extensive tactile cues and assistance to shift and maintain his weight on his L leg while simultaneously assisting in swinging and clearing his R foot while stepping. Poor carryover of cues though. Will continue to follow acutely.     If plan is discharge home, recommend the following: Assistance with cooking/housework;Direct supervision/assist for medications management;Assist for transportation;Help with stairs or ramp for entrance;Supervision due to cognitive status;Assistance with feeding;A lot of help with walking and/or transfers;A lot of help with bathing/dressing/bathroom   Can travel by private vehicle     Yes  Equipment Recommendations  None recommended by PT (defer to next venue)    Recommendations for Other Services       Precautions / Restrictions Precautions Precautions: Fall Precaution Comments: aphasia; PEG Required Braces or Orthoses: Other Brace (abdominal binder) Other Brace: abdominal binder, mittens restraints Restrictions Weight Bearing Restrictions: No     Mobility  Bed Mobility Overal bed  mobility: Needs Assistance Bed Mobility: Supine to Sit, Sit to Supine     Supine to sit: HOB elevated, Min assist Sit to supine: Contact guard assist, HOB elevated   General bed mobility comments: Gestured pt to come towards EOB to sit up with pt demonstrating good initiation, but needed cues to bring R hand with him and assistance to scoot R hip to L EOB. Gestural cues provided to return to supine with good initiation noted by pt, CGA for safety    Transfers Overall transfer level: Needs assistance Equipment used: Rolling walker (2 wheels) Transfers: Sit to/from Stand Sit to Stand: Min assist           General transfer comment: Pt needed hand-over-hand to place and keep L hand on bed to try to push up from bed to stand but pt still pulled L hand out from under therapist's and placed it on the RW to pull up to stand instead. MinA for balance. Gestures to initiate.    Ambulation/Gait Ambulation/Gait assistance: Mod assist, +2 physical assistance, +2 safety/equipment Gait Distance (Feet): 105 Feet Assistive device: Rolling walker (2 wheels) Gait Pattern/deviations: Decreased step length - right, Decreased dorsiflexion - right, Decreased dorsiflexion - left, Decreased stride length, Decreased weight shift to left, Decreased stance time - left, Step-to pattern, Drifts right/left, Trunk flexed Gait velocity: decreased Gait velocity interpretation: <1.31 ft/sec, indicative of household ambulator   General Gait Details: Pt ambulates with a slightly flexed posture and R lateral lean, avoiding weight shifting to the L and thus ambulating with decreased L stance time and thus R step length for a step-to gait pattern. Provided tactile cues at pt's R hip/buttocks to shift weight and  maintain weight on L foot during L stance phase and provided tactile cues at R posterior leg to left and advance and place R foot during swing phase. Pt resisting weight shift at times. Pt tends to drift to the R and  needs +2 assist to guide RW.   Stairs             Wheelchair Mobility     Tilt Bed    Modified Rankin (Stroke Patients Only) Modified Rankin (Stroke Patients Only) Pre-Morbid Rankin Score: Slight disability Modified Rankin: Moderately severe disability     Balance Overall balance assessment: Needs assistance Sitting-balance support: Feet supported, No upper extremity supported Sitting balance-Leahy Scale: Fair Sitting balance - Comments: supervision static sitting EOB   Standing balance support: Bilateral upper extremity supported, Reliant on assistive device for balance, During functional activity Standing balance-Leahy Scale: Poor Standing balance comment: reliant on RW and modA for balance                            Cognition Arousal: Alert Behavior During Therapy: Flat affect Overall Cognitive Status: Difficult to assess                                 General Comments: Pt attempted to mumble a couple times but unintelligible speech. Unable to follow verbal and demonstrated cues to shake/nod yes/no to questions or give thumbs up. Pt follows gestural cues to initiate movement and guide him at times. Poor awareness of his R side. Follows simple multi-modal gestural cues ~50% of time, but follows verbal cues alone <5% of time.        Exercises Other Exercises Other Exercises: PROM to R hand into finger extension 10x supine    General Comments        Pertinent Vitals/Pain Pain Assessment Pain Assessment: Faces Faces Pain Scale: Hurts a little bit Pain Location: generalized Pain Descriptors / Indicators: Discomfort, Grimacing Pain Intervention(s): Limited activity within patient's tolerance, Monitored during session, Repositioned    Home Living                          Prior Function            PT Goals (current goals can now be found in the care plan section) Acute Rehab PT Goals Patient Stated Goal: none stated  at this time PT Goal Formulation: Patient unable to participate in goal setting Time For Goal Achievement: 05/15/23 Potential to Achieve Goals: Fair Progress towards PT goals: Progressing toward goals    Frequency    Min 1X/week      PT Plan      Co-evaluation              AM-PAC PT "6 Clicks" Mobility   Outcome Measure  Help needed turning from your back to your side while in a flat bed without using bedrails?: A Little Help needed moving from lying on your back to sitting on the side of a flat bed without using bedrails?: A Little Help needed moving to and from a bed to a chair (including a wheelchair)?: A Lot Help needed standing up from a chair using your arms (e.g., wheelchair or bedside chair)?: A Little Help needed to walk in hospital room?: Total Help needed climbing 3-5 steps with a railing? : Total 6 Click Score: 13    End  of Session Equipment Utilized During Treatment: Gait belt Activity Tolerance: Patient tolerated treatment well Patient left: with call bell/phone within reach;with bed alarm set;in bed;with restraints reapplied Nurse Communication: Mobility status PT Visit Diagnosis: Unsteadiness on feet (R26.81);Other abnormalities of gait and mobility (R26.89);Muscle weakness (generalized) (M62.81);Difficulty in walking, not elsewhere classified (R26.2);Other symptoms and signs involving the nervous system (R29.898);Hemiplegia and hemiparesis Hemiplegia - Right/Left: Right Hemiplegia - dominant/non-dominant: Dominant Hemiplegia - caused by: Cerebral infarction     Time: 1610-9604 PT Time Calculation (min) (ACUTE ONLY): 22 min  Charges:    $Gait Training: 8-22 mins PT General Charges $$ ACUTE PT VISIT: 1 Visit                     Virgil Benedict, PT, DPT Acute Rehabilitation Services  Office: [phone]    Bettina Gavia 05/01/2023, 4:50 PM

## 2023-05-02 ENCOUNTER — Other Ambulatory Visit (HOSPITAL_COMMUNITY): Payer: Self-pay

## 2023-05-02 ENCOUNTER — Other Ambulatory Visit: Payer: Self-pay | Admitting: Neurology

## 2023-05-02 DIAGNOSIS — I639 Cerebral infarction, unspecified: Secondary | ICD-10-CM

## 2023-05-02 LAB — GLUCOSE, CAPILLARY
Glucose-Capillary: 125 mg/dL — ABNORMAL HIGH (ref 70–99)
Glucose-Capillary: 110 mg/dL — ABNORMAL HIGH (ref 70–99)
Glucose-Capillary: 86 mg/dL (ref 70–99)

## 2023-05-02 MED ORDER — DONEPEZIL HCL 5 MG PO TABS
5.0000 mg | ORAL_TABLET | Freq: Every day | ORAL | 0 refills | Status: DC
  Filled 2023-05-02: qty 30, 30d supply, fill #0

## 2023-05-02 MED ORDER — TICAGRELOR 90 MG PO TABS
90.0000 mg | ORAL_TABLET | Freq: Two times a day (BID) | ORAL | 0 refills | Status: DC
  Filled 2023-05-02: qty 60, 30d supply, fill #0

## 2023-05-02 MED ORDER — ASPIRIN 81 MG PO CHEW
81.0000 mg | CHEWABLE_TABLET | Freq: Every day | ORAL | 0 refills | Status: DC
  Filled 2023-05-02: qty 30, 30d supply, fill #0

## 2023-05-02 MED ORDER — ROSUVASTATIN CALCIUM 20 MG PO TABS
20.0000 mg | ORAL_TABLET | Freq: Every day | ORAL | 0 refills | Status: DC
  Filled 2023-05-02: qty 60, 60d supply, fill #0

## 2023-05-02 MED ORDER — QUETIAPINE FUMARATE 25 MG PO TABS
12.5000 mg | ORAL_TABLET | Freq: Two times a day (BID) | ORAL | 0 refills | Status: DC
  Filled 2023-05-02: qty 30, 30d supply, fill #0

## 2023-05-02 NOTE — Plan of Care (Signed)
Patient ID: Casey Rangel, male   DOB: 1950/12/22, 72 y.o.   MRN: [id number]  Problem: Acute Rehab OT Goals (only OT should resolve) Goal: Pt. Will Perform Grooming Outcome: Adequate for Discharge Goal: Pt. Will Perform Upper Body Bathing Outcome: Adequate for Discharge Goal: Pt. Will Perform Lower Body Dressing Outcome: Adequate for Discharge Goal: Pt. Will Transfer To Toilet Outcome: Adequate for Discharge Goal: OT Additional ADL Goal #1 Outcome: Adequate for Discharge Goal: OT Additional ADL Goal #2 Outcome: Adequate for Discharge   Problem: Acute Rehab PT Goals(only PT should resolve) Goal: Pt Will Go Supine/Side To Sit Outcome: Adequate for Discharge Goal: Patient Will Transfer Sit To/From Stand Outcome: Adequate for Discharge Goal: Pt Will Transfer Bed To Chair/Chair To Bed Outcome: Adequate for Discharge Goal: Pt Will Ambulate Outcome: Adequate for Discharge   Problem: Inadequate Intake (NI-2.1) Goal: Food and/or nutrient delivery Description: Individualized approach for food/nutrient provision. Outcome: Adequate for Discharge   Problem: SLP Language Goals Goal: Patient will communicate needs/wants with Outcome: Adequate for Discharge Goal: Patient will follow commands during a functional ADL with Outcome: Adequate for Discharge   Problem: Malnutrition  (NI-5.2) Goal: Food and/or nutrient delivery Description: Individualized approach for food/nutrient provision. Outcome: Adequate for Discharge    Lidia Collum, RN

## 2023-05-02 NOTE — Progress Notes (Signed)
Patient ID: Casey Rangel, male   DOB: 28-Apr-1951, 72 y.o.   MRN: [id number] Report called to [facility] and taken by Audelia Hives RN.  Patient's sister, [name], also called and updated.  .memd

## 2023-05-02 NOTE — Progress Notes (Signed)
Patient ID: Casey Rangel, male   DOB: Jul 11, 1951, 72 y.o.   MRN: [id number] Attempted to call report to [facility]. Will retry in a few minutes. Lidia Collum, RN

## 2023-05-02 NOTE — TOC Transition Note (Addendum)
Transition of Care [facility]) - CM/SW Discharge Note   Patient Details  Name: Casey Rangel MRN: [id number] Date of Birth: 09/16/1950  Transition of Care [facility]) CM/SW Contact:  Casey Rangel, [place] Phone Number: 05/02/2023, 1:24 PM   Clinical Narrative: pt has been without chemical or physical restraints for more than 24 hours. Per MD, plan for dc today. Spoke to [name] at [facility] who confirmed they are prepared to admit pt to room 126B. Pt's sister Casey Rangel aware of dc and reports agreeable. RN provided with number for report and PTAR arranged for transport. SW signing off at dc.   Dellie Burns, MSW, LCSW [phone] (coverage)      Final next level of care: Skilled Nursing Facility Barriers to Discharge: No Barriers Identified   Patient Goals and CMS Choice   Choice offered to / list presented to : Sibling  Discharge Placement                Patient chooses bed at: [facility] Patient to be transferred to facility by: PTAR Name of family member notified: Casey Rangel Patient and family notified of of transfer: 05/02/23  Discharge Plan and Services Additional resources added to the After Visit Summary for   In-house Referral: Clinical Social Work                                   Social Determinants of Health (SDOH) Interventions SDOH Screenings   Food Insecurity: No Food Insecurity (04/02/2023)  Housing: Medium Risk (03/21/2022)  Transportation Needs: No Transportation Needs (04/02/2023)  Utilities: Not At Risk (04/02/2023)  Physical Activity: Inactive (03/12/2022)  Social Connections: Socially Isolated (03/12/2022)  Tobacco Use: Medium Risk (04/16/2023)     Readmission Risk Interventions     No data to display

## 2023-05-02 NOTE — Plan of Care (Signed)
  Problem: Education: Goal: Knowledge of disease or condition will improve Outcome: Not Applicable Goal: Knowledge of secondary prevention will improve (MUST DOCUMENT ALL) Outcome: Not Applicable Goal: Knowledge of patient specific risk factors will improve Loraine Leriche N/A or DELETE if not current risk factor) Outcome: Not Applicable   Problem: Ischemic Stroke/TIA Tissue Perfusion: Goal: Complications of ischemic stroke/TIA will be minimized Outcome: Not Applicable   Problem: Coping: Goal: Will verbalize positive feelings about self Outcome: Not Applicable Goal: Will identify appropriate support needs Outcome: Not Applicable   Problem: Health Behavior/Discharge Planning: Goal: Ability to manage health-related needs will improve Outcome: Not Applicable Goal: Goals will be collaboratively established with patient/family Outcome: Not Applicable   Problem: Self-Care: Goal: Ability to participate in self-care as condition permits will improve Outcome: Not Applicable Goal: Verbalization of feelings and concerns over difficulty with self-care will improve Outcome: Not Applicable Goal: Ability to communicate needs accurately will improve Outcome: Not Applicable   Problem: Nutrition: Goal: Risk of aspiration will decrease Outcome: Not Applicable Goal: Dietary intake will improve Outcome: Not Applicable   Problem: Education: Goal: Understanding of CV disease, CV risk reduction, and recovery process will improve Outcome: Not Applicable Goal: Individualized Educational Video(s) Outcome: Not Applicable   Problem: Activity: Goal: Ability to return to baseline activity level will improve Outcome: Not Applicable   Problem: Cardiovascular: Goal: Ability to achieve and maintain adequate cardiovascular perfusion will improve Outcome: Not Applicable Goal: Vascular access site(s) Level 0-1 will be maintained Outcome: Not Applicable   Problem: Health Behavior/Discharge Planning: Goal:  Ability to safely manage health-related needs after discharge will improve Outcome: Not Applicable   Problem: Education: Goal: Knowledge of General Education information will improve Description: Including pain rating scale, medication(s)/side effects and non-pharmacologic comfort measures Outcome: Not Applicable   Problem: Health Behavior/Discharge Planning: Goal: Ability to manage health-related needs will improve Outcome: Not Applicable   Problem: Clinical Measurements: Goal: Ability to maintain clinical measurements within normal limits will improve Outcome: Not Applicable Goal: Will remain free from infection Outcome: Not Applicable Goal: Diagnostic test results will improve Outcome: Not Applicable Goal: Respiratory complications will improve Outcome: Not Applicable Goal: Cardiovascular complication will be avoided Outcome: Not Applicable   Problem: Activity: Goal: Risk for activity intolerance will decrease Outcome: Not Applicable   Problem: Nutrition: Goal: Adequate nutrition will be maintained Outcome: Not Applicable   Problem: Coping: Goal: Level of anxiety will decrease Outcome: Not Applicable   Problem: Elimination: Goal: Will not experience complications related to bowel motility Outcome: Not Applicable Goal: Will not experience complications related to urinary retention Outcome: Not Applicable   Problem: Pain Managment: Goal: General experience of comfort will improve Outcome: Not Applicable   Problem: Safety: Goal: Ability to remain free from injury will improve Outcome: Not Applicable   Problem: Skin Integrity: Goal: Risk for impaired skin integrity will decrease Outcome: Not Applicable

## 2023-05-04 ENCOUNTER — Encounter (HOSPITAL_COMMUNITY): Payer: Self-pay | Admitting: General Surgery

## 2023-05-09 ENCOUNTER — Encounter: Payer: Self-pay | Admitting: Adult Health

## 2023-05-20 ENCOUNTER — Encounter (HOSPITAL_COMMUNITY): Payer: Self-pay

## 2023-05-20 ENCOUNTER — Emergency Department (HOSPITAL_COMMUNITY): Payer: Self-pay

## 2023-05-20 ENCOUNTER — Emergency Department (HOSPITAL_COMMUNITY)
Admission: EM | Admit: 2023-05-20 | Discharge: 2023-05-21 | Disposition: A | Discharge status: Home / Self Care | Payer: Self-pay | Attending: Emergency Medicine | Admitting: Emergency Medicine

## 2023-05-20 DIAGNOSIS — K9423 Gastrostomy malfunction: Secondary | ICD-10-CM | POA: Insufficient documentation

## 2023-05-20 DIAGNOSIS — Z931 Gastrostomy status: Secondary | ICD-10-CM

## 2023-05-20 DIAGNOSIS — Z7982 Long term (current) use of aspirin: Secondary | ICD-10-CM | POA: Insufficient documentation

## 2023-05-20 MED ORDER — DIATRIZOATE MEGLUMINE & SODIUM 66-10 % PO SOLN
ORAL | Status: AC
  Filled 2023-05-20: qty 30

## 2023-05-20 MED ORDER — DIATRIZOATE MEGLUMINE & SODIUM 66-10 % PO SOLN
30.0000 mL | Freq: Once | ORAL | Status: AC
  Administered 2023-05-20: 30 mL
  Filled 2023-05-20: qty 30

## 2023-05-20 NOTE — ED Provider Notes (Signed)
[place] EMERGENCY DEPARTMENT AT [facility] Provider Note   CSN: [id number] Arrival date & time: 05/20/23  1522     History  Chief Complaint  Patient presents with   g-tube problem    Casey Rangel is a 71 y.o. male.  Patient sent in for evaluation of G-tube.  There is a concern that patient pulled on the tube and that it might not be functional.  And is unable to provide any history.        Home Medications Prior to Admission medications   Medication Sig Start Date End Date Taking? Authorizing Provider  aspirin 81 MG chewable tablet Place 1 tablet (81 mg total) into feeding tube daily. 05/03/23   Lynnae January, NP  carvedilol (COREG) 3.125 MG tablet Take 1 tablet (3.125 mg total) by mouth 2 (two) times daily with a meal. Patient not taking: Reported on 03/11/2022 01/21/22   de Saintclair Halsted, Cortney E, NP  donepezil (ARICEPT) 10 MG tablet Take half tablet (5 mg) daily for 2 weeks, then increase to the full tablet at 10 mg daily Patient not taking: Reported on 04/17/2023 03/20/22   Marcos Eke, PA-C  donepezil (ARICEPT) 5 MG tablet Place 1 tablet (5 mg total) into feeding tube at bedtime. 05/02/23   Lynnae January, NP  QUEtiapine (SEROQUEL) 25 MG tablet Place 0.5 tablets (12.5 mg total) into feeding tube 2 (two) times daily. 05/02/23   Lynnae January, NP  rosuvastatin (CRESTOR) 20 MG tablet Place 1 tablet (20 mg total) into feeding tube daily. 05/03/23   Lynnae January, NP  ticagrelor (BRILINTA) 90 MG TABS tablet Place 1 tablet (90 mg total) into feeding tube 2 (two) times daily. 05/02/23   Lynnae January, NP      Allergies    Penicillins    Review of Systems   Review of Systems  All other systems reviewed and are negative.   Physical Exam Updated Vital Signs BP (!) 132/113 (BP Location: Right Arm)   Pulse 96   Temp (!) 97.1 F (36.2 C) (Axillary)   Resp 15   Ht 5\' 10"  (1.778 m)   Wt 71.8 kg   SpO2 100%   BMI 22.71 kg/m  Physical Exam Vitals and nursing  note reviewed.  Constitutional:      Appearance: He is well-developed.  HENT:     Head: Normocephalic.  Cardiovascular:     Rate and Rhythm: Normal rate.  Pulmonary:     Effort: Pulmonary effort is normal.  Abdominal:     General: There is no distension.     Comments: G-tube in place.  Musculoskeletal:        General: Normal range of motion.  Neurological:     Mental Status: He is alert and oriented to person, place, and time.  Psychiatric:        Mood and Affect: Mood normal.     ED Results / Procedures / Treatments   Labs (all labs ordered are listed, but only abnormal results are displayed) Labs Reviewed - No data to display  EKG None  Radiology DG ABDOMEN PEG TUBE LOCATION  Result Date: 05/20/2023 CLINICAL DATA:  [id number] PEG (percutaneous endoscopic gastrostomy) status (HCC) [id number] EXAM: ABDOMEN - 1 VIEW COMPARISON:  04/24/2023. FINDINGS: Single lower chest upper abdominal radiograph was obtained after reported history of approximately 30 mL of Gastrografin administered via PEG tube. On this single projection, it is difficult to exclude small leak especially overlapping the contrast filled  bowel. Having said that, no discrete leak is seen on this image. The contrast opacifies stomach, duodenum and proximal jejunal loops. The bowel gas pattern is non-obstructive. No evidence of pneumoperitoneum, within the limitations of a supine film. No acute osseous abnormalities. The soft tissues are within normal limits. IMPRESSION: *No discrete leak is seen on this exam. Please see above for details. Electronically Signed   By: Jules Schick M.D.   On: 05/20/2023 18:48    Procedures Procedures    Medications Ordered in ED Medications  diatrizoate meglumine-sodium (GASTROGRAFIN) 66-10 % solution 30 mL (30 mLs Per Tube Given 05/20/23 1808)    ED Course/ Medical Decision Making/ A&P                                 Medical Decision Making Patient here for evaluation of  G-tube  Amount and/or Complexity of Data Reviewed Radiology: ordered and independent interpretation performed. Decision-making details documented in ED Course.    Details: X-ray shows contrast in stomach duodenum and proximal jejunum  Risk Prescription drug management. Risk Details: Patient's tube is in place and functional.  Patient is stable for discharge.           Final Clinical Impression(s) / ED Diagnoses Final diagnoses:  Gastrojejunal tube present [facility])    Rx / DC Orders ED Discharge Orders     None         Osie Cheeks 05/20/23 2332    Terald Sleeper, MD 05/22/23 [phone]

## 2023-05-20 NOTE — Discharge Instructions (Signed)
Return if any problems.

## 2023-05-20 NOTE — ED Notes (Signed)
Unable to obtain vitals at this time d/t attempting to re direct patient and prevent agitation

## 2023-05-20 NOTE — ED Triage Notes (Signed)
Lives in [facility]. Pt is nonverbal. Pulled his g-tube yesterday. Hx of vascular dementia.

## 2023-05-20 NOTE — ED Notes (Signed)
Patient attempting to get out of bed and place legs between railing.  Seizure placed placed to protect patient.  TV turned out for patient.  Pt comfortable laying in bed and waiting TV.  Will continue to monitor

## 2023-05-21 NOTE — ED Notes (Signed)
Patient incontinent of urine.  Changed and new adult brief placed prior to transport back to SNF

## 2023-05-21 NOTE — ED Notes (Signed)
Attempted report x 3 to [facility].  No answer.  Unable to leave voicemail

## 2023-05-25 ENCOUNTER — Telehealth: Payer: Self-pay

## 2023-05-25 NOTE — Transitions of Care (Post Inpatient/ED Visit) (Signed)
Unable to reach patient by phone and left v/m requesting call back at [phone].      05/25/2023  Name: Casey Rangel MRN: [id number] DOB: 1951/06/27  Today's TOC FU Call Status: Today's TOC FU Call Status:: Unsuccessful Call (1st Attempt) Unsuccessful Call (1st Attempt) Date: 05/25/23  Attempted to reach the patient regarding the most recent Inpatient/ED visit.  Follow Up Plan: Additional outreach attempts will be made to reach the patient to complete the Transitions of Care (Post Inpatient/ED visit) call.   Signature  Lewanda Rife, LPN

## 2023-05-25 NOTE — Transitions of Care (Post Inpatient/ED Visit) (Signed)
Casey Rangel (DPR signed) pt pulled out G tube at nursing facility and pt went to Ed to confirm G tube was in correct placement after being replaced at [facility] center. G tube was in correct place and functional Per xray at [facility] ED.Casey Rangel said pt is being taken care of and followed by physician at [facility] and neurologist.Sending note as FYI to Hayden Pedro FNP        05/25/2023  Name: Casey Rangel MRN: [id number] DOB: July 05, 1951  Today's TOC FU Call Status: Today's TOC FU Call Status:: Successful TOC FU Call Completed Unsuccessful Call (1st Attempt) Date: 05/25/23 [facility] FU Call Complete Date: 05/25/23 Patient's Name and Date of Birth confirmed.  Transition Care Management Follow-up Telephone Call Date of Discharge: 05/21/23 Discharge Facility: [facility]) Type of Discharge:  Casey Rangel (DPR signed) pt pulled out G tube at nursing facility and pt went to Ed to confirm G tube was in correct placement. G tube was in correct place.) How have you been since you were released from the hospital?: Same Any questions or concerns?: Yes Patient Questions/Concerns:: pts sister (DPR signed) has my charted neurologist but has not heard response and she will call neurologist office because Casey Rangel said since leaving the hospital initially pt is no better and is not aware of his surroundings and pt does not communicate and Casey Rangel wants to know how long pt can last.Casey Rangel said she does  not think therapy is helping Patient Questions/Concerns Addressed: Other: Casey Rangel will contact neurologist.)  Items Reviewed: Did you receive and understand the discharge instructions provided?: Yes Medications obtained,verified, and reconciled?: No ([facility] is where pt is located and they take care of all medications.) Medications Not Reviewed Reasons:: Other: Any new allergies since your discharge?: No Dietary orders reviewed?: NA Do you have support at home?: Yes People in  Home: facility resident Name of Support/Comfort Primary Source: [facility]  Medications Reviewed Today: Medications Reviewed Today   Medications were not reviewed in this encounter     Home Care and Equipment/Supplies: Were Home Health Services Ordered?: NA Any new equipment or medical supplies ordered?: NA  Functional Questionnaire: Do you need assistance with bathing/showering or dressing?: Yes [facility] is where pt is a resident and they care for pts needs.) Do you need assistance with meal preparation?: Yes Do you need assistance with eating?: Yes Do you have difficulty maintaining continence: Yes Do you need assistance with getting out of bed/getting out of a chair/moving?: Yes Do you have difficulty managing or taking your medications?: Yes  Follow up appointments reviewed: PCP Follow-up appointment confirmed?: NA Specialist Hospital Follow-up appointment confirmed?: Yes Date of Specialist follow-up appointment?: 06/03/23 Follow-Up Specialty Provider:: Ihor Austin NP with neurology Do you need transportation to your follow-up appointment?: Yes [facility] will provide transportation details) Do you understand care options if your condition(s) worsen?: Yes-patient verbalized understanding    SIGNATURE  Lewanda Rife, LPN

## 2023-05-25 NOTE — Telephone Encounter (Signed)
[name] pts sister (DPR signed) calling back. Pt seen [place] on 05/21/23 for questioning if G tube was in position;

## 2023-05-26 NOTE — Telephone Encounter (Signed)
Noted  

## 2023-06-02 NOTE — Progress Notes (Unsigned)
[name] [address] [place]. [place] [address] [phone]       HOSPITAL FOLLOW UP NOTE  [name] Date of Birth:  21-Jan-1951 Medical Record Number:  [id number]   Reason for Referral:  hospital stroke follow up    SUBJECTIVE:   CHIEF COMPLAINT:  No chief complaint on file.   HPI:   Casey Rangel is a 72 y.o. 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  Stroke workup revealed left MCA scattered infarcts due to left M2 MCA occlusion s/p IR with TICI 3 reperfusion and left M2 stenting likely secondary to large vessel disease.  Additional workup noted below.  Placed on aspirin and Brilinta post stent and advised outpatient IR follow-up.  LDL 132, started Crestor 20 mg daily.  Required PEG tube placement on 9/12 for dysphagia.  Remains globally aphasic and dense right hemiplegia.  Therapies recommended SNF, discharged on 9/14.         PERTINENT IMAGING  Code Stroke CT head Acute L MCA territory infarct involving L insula. Small vessel disease. ASPECTS 9.   CTA head & neck Acute occlusion of a proximal left M2, left M1 moderate stenosis, right ICA bulb ulcerated plaque, bilateral ICA bulb atherosclerosis. CT perfusion 45/91 S/p IR with TICI3 and left M2 stenting CT head s/p stent L MCA, Hyperdense material in the left Sylvian fissure, likely extravasated contrast. MRI  Multifocal acute ischemia within the left MCA territory. No hemorrhage or mass effect. 2D Echo EF 45-50%. Global hypokinesis of LV LDL 132 HgbA1c 5.2    ROS:   14 system review of systems performed and negative with exception of ***  PMH:  Past Medical History:  Diagnosis Date   GERD (gastroesophageal reflux disease)    Hiatal hernia    Stroke (HCC)      PSH:  Past Surgical History:  Procedure Laterality Date   ESOPHAGOGASTRODUODENOSCOPY (EGD) WITH PROPOFOL N/A 04/30/2023   Procedure: ESOPHAGOGASTRODUODENOSCOPY (EGD) WITH PROPOFOL;  Surgeon: Violeta Gelinas, MD;  Location: [facility] ENDOSCOPY;  Service: General;  Laterality: N/A;   INTRAMEDULLARY (IM) NAIL INTERTROCHANTERIC Left 10/17/2020   Procedure: INTRAMEDULLARY (IM) NAIL INTERTROCHANTRIC;  Surgeon: Kennedy Bucker, MD;  Location: [facility] ORS;  Service: Orthopedics;  Laterality: Left;   IR CT HEAD LTD  01/15/2022   IR CT HEAD LTD  04/16/2023   IR CT HEAD LTD  04/16/2023   IR INTRA CRAN STENT  04/16/2023   IR PERCUTANEOUS ART THROMBECTOMY/INFUSION INTRACRANIAL INC DIAG ANGIO  01/15/2022   IR PERCUTANEOUS ART THROMBECTOMY/INFUSION INTRACRANIAL INC DIAG ANGIO  04/16/2023   IR PTA INTRACRANIAL  01/15/2022   IR US GUIDE VASC ACCESS RIGHT  01/15/2022   PEG PLACEMENT N/A 04/30/2023   Procedure: PERCUTANEOUS ENDOSCOPIC GASTROSTOMY (PEG) PLACEMENT;  Surgeon: Violeta Gelinas, MD;  Location: [facility] ENDOSCOPY;  Service: General;  Laterality: N/A;   RADIOLOGY WITH ANESTHESIA N/A 01/15/2022   Procedure: RADIOLOGY WITH ANESTHESIA;  Surgeon: Julieanne Cotton, MD;  Location: [facility] OR;  Service: Radiology;  Laterality: N/A;   RADIOLOGY WITH ANESTHESIA N/A 04/16/2023   Procedure: IR WITH ANESTHESIA;  Surgeon: Radiologist, Medication, MD;  Location: [facility] OR;  Service: Radiology;  Laterality: N/A;   T9-T11 fusion in 1990      Social History:  Social History   Socioeconomic History   Marital status: Single  Spouse name: Not on file   Number of children: 0   Years of education: 93   Highest education level: Not on file  Occupational History   Not on file  Tobacco Use   Smoking status: Former    Current packs/day: 0.00    Average packs/day: 1.5 packs/day for 30.0 years (45.0 ttl pk-yrs)    Types: Cigarettes    Start date: 45    Quit date: 2001    Years since quitting: 23.8   Smokeless tobacco: Never  Substance and  Sexual Activity   Alcohol use: Not Currently   Drug use: Never   Sexual activity: Not Currently  Other Topics Concern   Not on file  Social History Narrative   Right handed   Yes caffeine   One story home   Social Determinants of Health   Financial Resource Strain: Not on file  Food Insecurity: No Food Insecurity (04/02/2023)   Hunger Vital Sign    Worried About Running Out of Food in the Last Year: Never true    Ran Out of Food in the Last Year: Never true  Transportation Needs: No Transportation Needs (04/02/2023)   PRAPARE - Administrator, Civil Service (Medical): No    Lack of Transportation (Non-Medical): No  Physical Activity: Inactive (03/12/2022)   Exercise Vital Sign    Days of Exercise per Week: 0 days    Minutes of Exercise per Session: 0 min  Stress: Not on file  Social Connections: Socially Isolated (03/12/2022)   Social Connection and Isolation Panel [NHANES]    Frequency of Communication with Friends and Family: Never    Frequency of Social Gatherings with Friends and Family: Never    Attends Religious Services: Never    Database administrator or Organizations: No    Attends Engineer, structural: Never    Marital Status: Never married  Catering manager Violence: Not on file    Family History:  Family History  Problem Relation Age of Onset   Hearing loss Mother    Liver disease Brother    Hearing loss Paternal Grandmother     Medications:   Current Outpatient Medications on File Prior to Visit  Medication Sig Dispense Refill   aspirin 81 MG chewable tablet Place 1 tablet (81 mg total) into feeding tube daily. 30 tablet 0   carvedilol (COREG) 3.125 MG tablet Take 1 tablet (3.125 mg total) by mouth 2 (two) times daily with a meal. (Patient not taking: Reported on 03/11/2022) 60 tablet 1   donepezil (ARICEPT) 10 MG tablet Take half tablet (5 mg) daily for 2 weeks, then increase to the full tablet at 10 mg daily (Patient not taking:  Reported on 04/17/2023) 30 tablet 3   donepezil (ARICEPT) 5 MG tablet Place 1 tablet (5 mg total) into feeding tube at bedtime. 30 tablet 0   QUEtiapine (SEROQUEL) 25 MG tablet Place 0.5 tablets (12.5 mg total) into feeding tube 2 (two) times daily. 30 tablet 0   rosuvastatin (CRESTOR) 20 MG tablet Place 1 tablet (20 mg total) into feeding tube daily. 60 tablet 0   ticagrelor (BRILINTA) 90 MG TABS tablet Place 1 tablet (90 mg total) into feeding tube 2 (two) times daily. 60 tablet 0   No current facility-administered medications on file prior to visit.    Allergies:   Allergies  Allergen Reactions   Penicillins Other (See Comments)    Unknown       OBJECTIVE:  Physical Exam  There were no vitals filed for this visit. There is no height or weight on file to calculate BMI. No results found.      No data to display           General: well developed, well nourished, seated, in no evident distress Head: head normocephalic and atraumatic.   Neck: supple with no carotid or supraclavicular bruits Cardiovascular: regular rate and rhythm, no murmurs Musculoskeletal: no deformity Skin:  no rash/petichiae Vascular:  Normal pulses all extremities   Neurologic Exam Mental Status: Awake and fully alert. Oriented to place and time. Recent and remote memory intact. Attention span, concentration and fund of knowledge appropriate. Mood and affect appropriate.  Cranial Nerves: Fundoscopic exam reveals sharp disc margins. Pupils equal, briskly reactive to light. Extraocular movements full without nystagmus. Visual fields full to confrontation. Hearing intact. Facial sensation intact. Face, tongue, palate moves normally and symmetrically.  Motor: Normal bulk and tone. Normal strength in all tested extremity muscles Sensory.: intact to touch , pinprick , position and vibratory sensation.  Coordination: Rapid alternating movements normal in all extremities. Finger-to-nose and heel-to-shin  performed accurately bilaterally. Gait and Station: Arises from chair without difficulty. Stance is normal. Gait demonstrates normal stride length and balance with ***. Tandem walk and heel toe ***.  Reflexes: 1+ and symmetric. Toes downgoing.     NIHSS  *** Modified Rankin  ***      ASSESSMENT: Casey Rangel is a 72 y.o. year old male with left MCA scattered infarcts on 04/16/2023 due to left M2 MCA occlusion s/p IR with TICI 3 and L M2 stenting, etiology likely large vessel disease. Vascular risk factors include hx of L MCA stroke in 2023 with residual mild to moderate aphasia, recent BL small volume SDH in 01/2023, HTN, CHF, HLD and former tobacco use.      PLAN:  Recurrent L MCA stroke :  Residual deficit: ***.  Continue aspirin 81mg  daily and rosuvastatin (Crestor) for secondary stroke prevention.   Discussed secondary stroke prevention measures and importance of close PCP follow up for aggressive stroke risk factor management including BP goal<130/90, HLD with LDL goal<70 and DM with A1c.<7 .  Stroke labs 03/2023: LDL 132, A1c 5.2 I have gone over the pathophysiology of stroke, warning signs and symptoms, risk factors and their management in some detail with instructions to go to the closest emergency room for symptoms of concern. L MCA stent: Continue Brilinta as advised by IR Ensure follow-up for surveillance monitoring and further recommendations regarding Brilinta duration    Follow up in *** or call earlier if needed   CC:  [facility] provider: Dr. Pearlean Brownie PCP: Mort Sawyers, FNP    I spent *** minutes of face-to-face and non-face-to-face time with patient.  This included previsit chart review including review of recent hospitalization, lab review, study review, order entry, electronic health record documentation, patient education regarding recent stroke including etiology, secondary stroke prevention measures and importance of managing stroke risk factors, residual deficits  and typical recovery time and answered all other questions to patient satisfaction   Ihor Austin, AGNP-BC  [facility] [address] [place], [place] [address]  Phone [phone] Fax [phone] Note: This document was prepared with digital dictation and possible smart phrase technology. Any transcriptional errors that result from this process are unintentional.

## 2023-06-03 ENCOUNTER — Ambulatory Visit (INDEPENDENT_AMBULATORY_CARE_PROVIDER_SITE_OTHER): Payer: Self-pay | Admitting: Adult Health

## 2023-06-03 ENCOUNTER — Other Ambulatory Visit (HOSPITAL_COMMUNITY): Payer: Self-pay | Admitting: Interventional Radiology

## 2023-06-03 ENCOUNTER — Encounter: Payer: Self-pay | Admitting: Adult Health

## 2023-06-03 VITALS — BP 141/96 | HR 94 | Ht 68.0 in | Wt 148.5 lb

## 2023-06-03 DIAGNOSIS — R4701 Aphasia: Secondary | ICD-10-CM

## 2023-06-03 DIAGNOSIS — I6602 Occlusion and stenosis of left middle cerebral artery: Secondary | ICD-10-CM

## 2023-06-03 DIAGNOSIS — I69391 Dysphagia following cerebral infarction: Secondary | ICD-10-CM

## 2023-06-03 DIAGNOSIS — I63512 Cerebral infarction due to unspecified occlusion or stenosis of left middle cerebral artery: Secondary | ICD-10-CM

## 2023-06-03 DIAGNOSIS — G8191 Hemiplegia, unspecified affecting right dominant side: Secondary | ICD-10-CM

## 2023-06-03 NOTE — Patient Instructions (Addendum)
Continue working with therapies as scheduled  Can consider increased dose of Seroquel if needed for any behavioral concerns but will defer to facility  Continue aspirin 81 mg daily  and Crestor for secondary stroke prevention  Continue Brilinta post stent placement - contacted interventional radiology during visit who will contact facility to schedule a follow up visit with Dr. Corliss Skains who will monitor left MCA stent and further determine duration of Brilinta  Continue to follow up with PCP regarding blood pressure and cholesterol management  Maintain strict control of hypertension with blood pressure goal below 130/90 and cholesterol with LDL cholesterol (bad cholesterol) goal below 70 mg/dL.   Signs of a Stroke? Follow the BEFAST method:  Balance Watch for a sudden loss of balance, trouble with coordination or vertigo Eyes Is there a sudden loss of vision in one or both eyes? Or double vision?  Face: Ask the person to smile. Does one side of the face droop or is it numb?  Arms: Ask the person to raise both arms. Does one arm drift downward? Is there weakness or numbness of a leg? Speech: Ask the person to repeat a simple phrase. Does the speech sound slurred/strange? Is the person confused ? Time: If you observe any of these signs, call [phone].    No indication for routine follow-up in this office as visits limited due to patients residual global aphasia. Please call office with any questions or concerns regarding his stroke     Thank you for coming to see [place] at [facility]. I hope we have been able to provide you high quality care today.  You may receive a patient satisfaction survey over the next few weeks. We would appreciate your feedback and comments so that we may continue to improve ourselves and the health of our patients.

## 2023-06-09 ENCOUNTER — Emergency Department (HOSPITAL_COMMUNITY): Payer: Self-pay

## 2023-06-09 ENCOUNTER — Other Ambulatory Visit: Payer: Self-pay

## 2023-06-09 ENCOUNTER — Encounter (HOSPITAL_COMMUNITY): Payer: Self-pay | Admitting: Emergency Medicine

## 2023-06-09 ENCOUNTER — Inpatient Hospital Stay (HOSPITAL_COMMUNITY)
Admission: EM | Admit: 2023-06-09 | Discharge: 2023-06-12 | DRG: 480 | Disposition: A | Discharge status: Skilled Nursing Facility | Payer: Medicare Other | Source: Skilled Nursing Facility | Attending: Internal Medicine | Admitting: Internal Medicine

## 2023-06-09 DIAGNOSIS — R1310 Dysphagia, unspecified: Secondary | ICD-10-CM | POA: Diagnosis present

## 2023-06-09 DIAGNOSIS — Y92129 Unspecified place in nursing home as the place of occurrence of the external cause: Secondary | ICD-10-CM

## 2023-06-09 DIAGNOSIS — S065XAA Traumatic subdural hemorrhage with loss of consciousness status unknown, initial encounter: Secondary | ICD-10-CM

## 2023-06-09 DIAGNOSIS — F0150 Vascular dementia without behavioral disturbance: Secondary | ICD-10-CM | POA: Diagnosis present

## 2023-06-09 DIAGNOSIS — Z8489 Family history of other specified conditions: Secondary | ICD-10-CM

## 2023-06-09 DIAGNOSIS — Z7902 Long term (current) use of antithrombotics/antiplatelets: Secondary | ICD-10-CM

## 2023-06-09 DIAGNOSIS — E785 Hyperlipidemia, unspecified: Secondary | ICD-10-CM | POA: Diagnosis present

## 2023-06-09 DIAGNOSIS — E440 Moderate protein-calorie malnutrition: Secondary | ICD-10-CM | POA: Diagnosis present

## 2023-06-09 DIAGNOSIS — S72141A Displaced intertrochanteric fracture of right femur, initial encounter for closed fracture: Principal | ICD-10-CM | POA: Diagnosis present

## 2023-06-09 DIAGNOSIS — I10 Essential (primary) hypertension: Secondary | ICD-10-CM | POA: Diagnosis present

## 2023-06-09 DIAGNOSIS — Z79899 Other long term (current) drug therapy: Secondary | ICD-10-CM | POA: Diagnosis not present

## 2023-06-09 DIAGNOSIS — Z822 Family history of deafness and hearing loss: Secondary | ICD-10-CM | POA: Diagnosis not present

## 2023-06-09 DIAGNOSIS — Z931 Gastrostomy status: Secondary | ICD-10-CM

## 2023-06-09 DIAGNOSIS — Z880 Allergy status to penicillin: Secondary | ICD-10-CM

## 2023-06-09 DIAGNOSIS — W050XXA Fall from non-moving wheelchair, initial encounter: Secondary | ICD-10-CM | POA: Diagnosis present

## 2023-06-09 DIAGNOSIS — D62 Acute posthemorrhagic anemia: Secondary | ICD-10-CM | POA: Diagnosis present

## 2023-06-09 DIAGNOSIS — S72009A Fracture of unspecified part of neck of unspecified femur, initial encounter for closed fracture: Secondary | ICD-10-CM | POA: Diagnosis present

## 2023-06-09 DIAGNOSIS — Z95828 Presence of other vascular implants and grafts: Secondary | ICD-10-CM

## 2023-06-09 DIAGNOSIS — Z87891 Personal history of nicotine dependence: Secondary | ICD-10-CM

## 2023-06-09 DIAGNOSIS — Z7982 Long term (current) use of aspirin: Secondary | ICD-10-CM | POA: Diagnosis not present

## 2023-06-09 DIAGNOSIS — K219 Gastro-esophageal reflux disease without esophagitis: Secondary | ICD-10-CM | POA: Diagnosis present

## 2023-06-09 DIAGNOSIS — I69320 Aphasia following cerebral infarction: Secondary | ICD-10-CM

## 2023-06-09 DIAGNOSIS — R29700 NIHSS score 0: Secondary | ICD-10-CM | POA: Diagnosis present

## 2023-06-09 DIAGNOSIS — S72001A Fracture of unspecified part of neck of right femur, initial encounter for closed fracture: Principal | ICD-10-CM

## 2023-06-09 DIAGNOSIS — Z8673 Personal history of transient ischemic attack (TIA), and cerebral infarction without residual deficits: Secondary | ICD-10-CM

## 2023-06-09 DIAGNOSIS — I6202 Nontraumatic subacute subdural hemorrhage: Secondary | ICD-10-CM | POA: Diagnosis present

## 2023-06-09 DIAGNOSIS — Z66 Do not resuscitate: Secondary | ICD-10-CM | POA: Diagnosis present

## 2023-06-09 DIAGNOSIS — I63512 Cerebral infarction due to unspecified occlusion or stenosis of left middle cerebral artery: Secondary | ICD-10-CM

## 2023-06-09 DIAGNOSIS — S7291XA Unspecified fracture of right femur, initial encounter for closed fracture: Principal | ICD-10-CM

## 2023-06-09 LAB — CBC WITH DIFFERENTIAL/PLATELET
Abs Immature Granulocytes: 0.05 10*3/uL (ref 0.00–0.07)
Basophils Absolute: 0.1 10*3/uL (ref 0.0–0.1)
Basophils Relative: 1 %
Eosinophils Absolute: 0.6 10*3/uL — ABNORMAL HIGH (ref 0.0–0.5)
Eosinophils Relative: 4 %
HCT: 39.2 % (ref 39.0–52.0)
Hemoglobin: 13.5 g/dL (ref 13.0–17.0)
Immature Granulocytes: 0 %
Lymphocytes Relative: 10 %
Lymphs Abs: 1.4 10*3/uL (ref 0.7–4.0)
MCH: 31.8 pg (ref 26.0–34.0)
MCHC: 34.4 g/dL (ref 30.0–36.0)
MCV: 92.2 fL (ref 80.0–100.0)
Monocytes Absolute: 0.9 10*3/uL (ref 0.1–1.0)
Monocytes Relative: 7 %
Neutro Abs: 10.9 10*3/uL — ABNORMAL HIGH (ref 1.7–7.7)
Neutrophils Relative %: 78 %
Platelets: 267 10*3/uL (ref 150–400)
RBC: 4.25 MIL/uL (ref 4.22–5.81)
RDW: 15.4 % (ref 11.5–15.5)
WBC: 13.9 10*3/uL — ABNORMAL HIGH (ref 4.0–10.5)
nRBC: 0 % (ref 0.0–0.2)

## 2023-06-09 LAB — COMPREHENSIVE METABOLIC PANEL
ALT: 29 U/L (ref 0–44)
AST: 21 U/L (ref 15–41)
Albumin: 2.9 g/dL — ABNORMAL LOW (ref 3.5–5.0)
Alkaline Phosphatase: 69 U/L (ref 38–126)
Anion gap: 10 (ref 5–15)
BUN: 25 mg/dL — ABNORMAL HIGH (ref 8–23)
CO2: 21 mmol/L — ABNORMAL LOW (ref 22–32)
Calcium: 8.5 mg/dL — ABNORMAL LOW (ref 8.9–10.3)
Chloride: 107 mmol/L (ref 98–111)
Creatinine, Ser: 0.8 mg/dL (ref 0.61–1.24)
GFR, Estimated: >60 mL/min (ref >60)
Glucose, Bld: 105 mg/dL — ABNORMAL HIGH (ref 70–99)
Potassium: 3.9 mmol/L (ref 3.5–5.1)
Sodium: 138 mmol/L (ref 135–145)
Total Bilirubin: 1.3 mg/dL — ABNORMAL HIGH (ref 0.3–1.2)
Total Protein: 6.1 g/dL — ABNORMAL LOW (ref 6.5–8.1)

## 2023-06-09 MED ORDER — MORPHINE SULFATE (PF) 2 MG/ML IV SOLN
2.0000 mg | INTRAVENOUS | Status: DC | PRN
  Administered 2023-06-09 – 2023-06-10 (×3): 2 mg via INTRAVENOUS
  Filled 2023-06-09 (×3): qty 1

## 2023-06-09 MED ORDER — DEXTROSE IN LACTATED RINGERS 5 % IV SOLN: INTRAVENOUS | Status: AC

## 2023-06-09 MED ORDER — HYDROCODONE-ACETAMINOPHEN 5-325 MG PO TABS: 1.0000 | ORAL_TABLET | Freq: Four times a day (QID) | ORAL | Status: DC | PRN

## 2023-06-09 NOTE — Assessment & Plan Note (Addendum)
S/p PEG tube -sister says facility nurse told her he no longer tries to chew when they try oral intake. Will keep NPO -dietician consult for tube feed nutrition needs -gentle IV fluids overnight

## 2023-06-09 NOTE — Consult Note (Signed)
Patient ID: Casey Rangel MRN: [id number] DOB/AGE: 72-Feb-1952 72 72 y.o.  Admit date: 06/09/2023  Admission Diagnoses:  Principal Problem:   Right femoral fracture (HCC) Active Problems:   HTN (hypertension)   Vascular dementia (HCC)   History of stroke   Dysphagia   PEG (percutaneous endoscopic gastrostomy) status (HCC)   Subdural hematoma (HCC)   HPI: Ortho consult for right comminuted displaced intertrochanteric hip fracture sustained 06/09/23.  PMH notable for prior L MCA stroke with residual aphasia, recent left MCA infarct s/p IR mechanical thrombectomy with TICI3 and left M2 stenting (04/16/2023), dementia, HTN, HLD, dysphagia with PEG tube.  On DAPT with aspirin and brilinta. Held today for at least 3 wk at direction of Neurosurgery due to subacute/chronic SDH.   Reportedly patient fell out of wheelchair at his nursing facility.  History obtained by chart review, discussion with primary team/ER and bedside nursing as patient himself is non verbal at baseline.  Past Medical History: Past Medical History:  Diagnosis Date   GERD (gastroesophageal reflux disease)    Hiatal hernia    Stroke [facility])     Surgical History: Past Surgical History:  Procedure Laterality Date   ESOPHAGOGASTRODUODENOSCOPY (EGD) WITH PROPOFOL N/A 04/30/2023   Procedure: ESOPHAGOGASTRODUODENOSCOPY (EGD) WITH PROPOFOL;  Surgeon: Violeta Gelinas, MD;  Location: [facility] ENDOSCOPY;  Service: General;  Laterality: N/A;   INTRAMEDULLARY (IM) NAIL INTERTROCHANTERIC Left 10/17/2020   Procedure: INTRAMEDULLARY (IM) NAIL INTERTROCHANTRIC;  Surgeon: Kennedy Bucker, MD;  Location: [facility] ORS;  Service: Orthopedics;  Laterality: Left;   IR CT HEAD LTD  01/15/2022   IR CT HEAD LTD  04/16/2023   IR CT HEAD LTD  04/16/2023   IR INTRA CRAN STENT  04/16/2023   IR PERCUTANEOUS ART THROMBECTOMY/INFUSION INTRACRANIAL INC DIAG ANGIO  01/15/2022   IR PERCUTANEOUS ART THROMBECTOMY/INFUSION INTRACRANIAL INC DIAG ANGIO  04/16/2023    IR PTA INTRACRANIAL  01/15/2022   IR US GUIDE VASC ACCESS RIGHT  01/15/2022   PEG PLACEMENT N/A 04/30/2023   Procedure: PERCUTANEOUS ENDOSCOPIC GASTROSTOMY (PEG) PLACEMENT;  Surgeon: Violeta Gelinas, MD;  Location: [facility] ENDOSCOPY;  Service: General;  Laterality: N/A;   RADIOLOGY WITH ANESTHESIA N/A 01/15/2022   Procedure: RADIOLOGY WITH ANESTHESIA;  Surgeon: Julieanne Cotton, MD;  Location: [facility] OR;  Service: Radiology;  Laterality: N/A;   RADIOLOGY WITH ANESTHESIA N/A 04/16/2023   Procedure: IR WITH ANESTHESIA;  Surgeon: Radiologist, Medication, MD;  Location: [facility] OR;  Service: Radiology;  Laterality: N/A;   T9-T11 fusion in 73      Family History: Family History  Problem Relation Age of Onset   Hearing loss Mother    Liver disease Brother    Hearing loss Paternal Grandmother     Social History: Social History   Socioeconomic History   Marital status: Single    Spouse name: Not on file   Number of children: 0   Years of education: 16   Highest education level: Not on file  Occupational History   Not on file  Tobacco Use   Smoking status: Former    Current packs/day: 0.00    Average packs/day: 1.5 packs/day for 30.0 years (45.0 ttl pk-yrs)    Types: Cigarettes    Start date: 38    Quit date: 2001    Years since quitting: 23.8   Smokeless tobacco: Never  Substance and Sexual Activity   Alcohol use: Not Currently   Drug use: Never   Sexual activity: Not Currently  Other Topics Concern   Not on file  Social History Narrative   Right handed   Yes caffeine   One story home   Social Determinants of Health   Financial Resource Strain: Not on file  Food Insecurity: No Food Insecurity (04/02/2023)   Hunger Vital Sign    Worried About Running Out of Food in the Last Year: Never true    Ran Out of Food in the Last Year: Never true  Transportation Needs: No Transportation Needs (04/02/2023)   PRAPARE - Administrator, Civil Service (Medical): No    Lack of  Transportation (Non-Medical): No  Physical Activity: Inactive (03/12/2022)   Exercise Vital Sign    Days of Exercise per Week: 0 days    Minutes of Exercise per Session: 0 min  Stress: Not on file  Social Connections: Socially Isolated (03/12/2022)   Social Connection and Isolation Panel [NHANES]    Frequency of Communication with Friends and Family: Never    Frequency of Social Gatherings with Friends and Family: Never    Attends Religious Services: Never    Database administrator or Organizations: No    Attends Banker Meetings: Never    Marital Status: Never married  Catering manager Violence: Not on file    Allergies: Penicillins  Medications: I have reviewed the patient's current medications.  Vital Signs: Patient Vitals for the past 24 hrs:  BP Temp Temp src Pulse Resp SpO2  06/09/23 1637 137/79 98.3 F (36.8 C) Oral 70 18 100 %  06/09/23 1629 -- -- -- -- -- 98 %    Radiology: CT Head Wo Contrast  Result Date: 06/09/2023 CLINICAL DATA:  Head trauma, minor (Age >= 65y); Neck trauma (Age >= 65y) EXAM: CT HEAD WITHOUT CONTRAST CT CERVICAL SPINE WITHOUT CONTRAST TECHNIQUE: Multidetector CT imaging of the head and cervical spine was performed following the standard protocol without intravenous contrast. Multiplanar CT image reconstructions of the cervical spine were also generated. RADIATION DOSE REDUCTION: This exam was performed according to the departmental dose-optimization program which includes automated exposure control, adjustment of the mA and/or kV according to patient size and/or use of iterative reconstruction technique. COMPARISON:  Head CT 04/16/2023, Brain MR 04/16/23 FINDINGS: CT HEAD FINDINGS Brain: There are bilateral subdural hematomas measuring up to 5 mm along the right cerebral convexity and 4 mm along the left cerebral convexity. These were present prior brain MRI dated 04/16/2023 There are chronic infarcts in the right thalamus in the left basal  ganglia. Chronic right occipital lobe, right frontal lobe, and left parietal lobe infarct. No hydrocephalus. Mass effect. No mass lesion. Vascular: No hyperdense vessel or unexpected calcification. Redemonstrated vascular stent in the left M2 segment Skull: Normal. Negative for fracture or focal lesion. Sinuses/Orbits: Middle ear effusion. Trace right mastoid effusion. Mild mucosal left maxillary posterior right ethmoid air cells. Orbits are unremarkable. Other: None. CT CERVICAL SPINE FINDINGS Alignment: Normal. Skull base and vertebrae: No acute fracture. No primary bone lesion or focal pathologic process. Soft tissues and spinal canal: No prevertebral fluid or swelling. No visible canal hematoma. Disc levels:  No CT evidence of high-grade spinal canal stenosis. Upper chest: 3 mm solid right upper lobe pulmonary nodule, likely infectious or inflammatory. Other: None IMPRESSION: 1. Bilateral subdural collections measuring up to 5 mm along the right cerebral convexity and 4 mm along the left cerebral convexity. No mass effect. 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 2. No acute fracture or traumatic subluxation of the cervical  spine. Electronically Signed   By: Lorenza Cambridge M.D.   On: 06/09/2023 21:29   CT Cervical Spine Wo Contrast  Result Date: 06/09/2023 CLINICAL DATA:  Head trauma, minor (Age >= 65y); Neck trauma (Age >= 65y) EXAM: CT HEAD WITHOUT CONTRAST CT CERVICAL SPINE WITHOUT CONTRAST TECHNIQUE: Multidetector CT imaging of the head and cervical spine was performed following the standard protocol without intravenous contrast. Multiplanar CT image reconstructions of the cervical spine were also generated. RADIATION DOSE REDUCTION: This exam was performed according to the departmental dose-optimization program which includes automated exposure control, adjustment of the mA and/or kV according to patient size and/or use of iterative reconstruction  technique. COMPARISON:  Head CT 04/16/2023, Brain MR 04/16/23 FINDINGS: CT HEAD FINDINGS Brain: There are bilateral subdural hematomas measuring up to 5 mm along the right cerebral convexity and 4 mm along the left cerebral convexity. These were present prior brain MRI dated 04/16/2023 There are chronic infarcts in the right thalamus in the left basal ganglia. Chronic right occipital lobe, right frontal lobe, and left parietal lobe infarct. No hydrocephalus. Mass effect. No mass lesion. Vascular: No hyperdense vessel or unexpected calcification. Redemonstrated vascular stent in the left M2 segment Skull: Normal. Negative for fracture or focal lesion. Sinuses/Orbits: Middle ear effusion. Trace right mastoid effusion. Mild mucosal left maxillary posterior right ethmoid air cells. Orbits are unremarkable. Other: None. CT CERVICAL SPINE FINDINGS Alignment: Normal. Skull base and vertebrae: No acute fracture. No primary bone lesion or focal pathologic process. Soft tissues and spinal canal: No prevertebral fluid or swelling. No visible canal hematoma. Disc levels:  No CT evidence of high-grade spinal canal stenosis. Upper chest: 3 mm solid right upper lobe pulmonary nodule, likely infectious or inflammatory. Other: None IMPRESSION: 1. Bilateral subdural collections measuring up to 5 mm along the right cerebral convexity and 4 mm along the left cerebral convexity. No mass effect. 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 2. No acute fracture or traumatic subluxation of the cervical spine. Electronically Signed   By: Lorenza Cambridge M.D.   On: 06/09/2023 21:29   DG Femur Min 2 Views Right  Result Date: 06/09/2023 CLINICAL DATA:  Pain after fall. EXAM: RIGHT FEMUR 2 VIEWS; PELVIS - 1-2 VIEW COMPARISON:  None Available. FINDINGS: Pelvis: Comminuted and displaced intertrochanteric right proximal femur fracture. No hip dislocation. Mild underlying right hip osteoarthritis.  Left proximal femur hardware is partially included in the field of view. The bones are diffusely under mineralized. No pubic symphyseal or sacroiliac diastasis. No pubic rami fractures. Right femur: No additional fracture of the femur. The distal femur is intact. Knee alignment is grossly maintained on provided views, although suboptimally assessed. The bones are subjectively under mineralized. Vascular calcifications. IMPRESSION: 1. Comminuted and displaced intertrochanteric right proximal femur fracture. 2. No additional fracture of the right femur. 3. Osteopenia/osteoporosis. Electronically Signed   By: Narda Rutherford M.D.   On: 06/09/2023 19:57   DG Pelvis 1-2 Views  Result Date: 06/09/2023 CLINICAL DATA:  Pain after fall. EXAM: RIGHT FEMUR 2 VIEWS; PELVIS - 1-2 VIEW COMPARISON:  None Available. FINDINGS: Pelvis: Comminuted and displaced intertrochanteric right proximal femur fracture. No hip dislocation. Mild underlying right hip osteoarthritis. Left proximal femur hardware is partially included in the field of view. The bones are diffusely under mineralized. No pubic symphyseal or sacroiliac diastasis. No pubic rami fractures. Right femur: No additional fracture of the femur. The distal femur is intact. Knee alignment is grossly maintained  on provided views, although suboptimally assessed. The bones are subjectively under mineralized. Vascular calcifications. IMPRESSION: 1. Comminuted and displaced intertrochanteric right proximal femur fracture. 2. No additional fracture of the right femur. 3. Osteopenia/osteoporosis. Electronically Signed   By: Narda Rutherford M.D.   On: 06/09/2023 19:57   DG ABDOMEN PEG TUBE LOCATION  Result Date: 05/20/2023 CLINICAL DATA:  [id number] PEG (percutaneous endoscopic gastrostomy) status (HCC) [id number] EXAM: ABDOMEN - 1 VIEW COMPARISON:  04/24/2023. FINDINGS: Single lower chest upper abdominal radiograph was obtained after reported history of approximately 30 mL of  Gastrografin administered via PEG tube. On this single projection, it is difficult to exclude small leak especially overlapping the contrast filled bowel. Having said that, no discrete leak is seen on this image. The contrast opacifies stomach, duodenum and proximal jejunal loops. The bowel gas pattern is non-obstructive. No evidence of pneumoperitoneum, within the limitations of a supine film. No acute osseous abnormalities. The soft tissues are within normal limits. IMPRESSION: *No discrete leak is seen on this exam. Please see above for details. Electronically Signed   By: Jules Schick M.D.   On: 05/20/2023 18:48    Labs: Recent Labs    06/09/23 2159  WBC 13.9*  RBC 4.25  HCT 39.2  PLT 267   Recent Labs    06/09/23 2159  NA 138  K 3.9  CL 107  CO2 21*  BUN 25*  CREATININE 0.80  GLUCOSE 105*  CALCIUM 8.5*   No results for input(s): "LABPT", "INR" in the last 72 hours.  Review of Systems: ROS as detailed in HPI  Physical Exam: There is no height or weight on file to calculate BMI.  Physical Exam   Gen: NAD, comfortable at rest  Right Lower Extremity: Skin intact TTP over proximal femur Pain with any attempted movement of right hip Unable to test motor/sensory due to inability of patient to follow commands DP, PT 2+ to palp CR < 2s   Assessment and Plan: Ortho consult for right comminuted displaced intertrochanteric hip fracture sustained 06/09/23 in setting of minimal ambulatory status  -history, exam and imaging reviewed at length -will review imaging with Ortho Trauma colleagues in AM. Unfortunate and unique situation as patient is medically complex while also minimally ambulatory. However, he is also acutely painful from right hip fracture -please keep NPO and hold VTE ppx from MN for possible surgery on 06/10/23 -admitted to Hospitalist team, appreciate care by them as well as Neurosurgery team  Netta Cedars, MD Orthopaedic Surgeon [facility] [phone]  Surgical risks entail hardware failure/irritation, new/persistent/recurrent infection, stiffness, nerve/vessel/tendon injury, nonunion/malunion of any fracture, wound healing issues, allograft usage, development of arthritis, failure of this surgery, possibility of external fixation in certain situations, possibility of delayed definitive surgery, need for further surgery, prolonged wound care including further soft tissue coverage procedures, thromboembolic events, anesthesia/medical complications/events perioperatively and beyond, amputation, death among others relevant in this scenario.

## 2023-06-09 NOTE — H&P (Addendum)
History and Physical    Patient: Casey Rangel ZOX:[id number] DOB: [birth year] DOA: 06/09/2023 DOS: the patient was seen and examined on 06/09/2023 PCP: Mort Sawyers, FNP  Patient coming from: Home  Chief Complaint:  Chief Complaint  Patient presents with   Fall        HPI: Casey Rangel is a 72 y.o. male with medical history significant of prior L MCA stroke with residual aphasia, recent left MCA infarct s/p IR mechanical thrombectomy with TICI3 and left M2 stenting (04/16/2023), dementia, HTN, HLD, dysphagia with PEG tube who presents following a fall.   History obtained from documentation and ED report since pt has aphasia at baseline. Pt reportedly fell out of wheelchair at his facility. Presented with internally rotated and shortening of right hip.  X-ray demonstrated comminuted and displaced intertrochanteric right proximal femur fracture. Orthopedic consulted and will take to OR tomorrow.   CT head was reviewed by neurosurgery Dr. Conchita Paris who read it as subacute to chronic bilateral subdural collections without mass effect. Recommended holding DAPT for 3 weeks with repeat imaging.   CBC, BMP pending at time of admission.  Review of Systems: unable to review all systems due to the inability of the patient to answer questions. Past Medical History:  Diagnosis Date   GERD (gastroesophageal reflux disease)    Hiatal hernia    Stroke [facility])    Past Surgical History:  Procedure Laterality Date   ESOPHAGOGASTRODUODENOSCOPY (EGD) WITH PROPOFOL N/A 04/30/2023   Procedure: ESOPHAGOGASTRODUODENOSCOPY (EGD) WITH PROPOFOL;  Surgeon: Violeta Gelinas, MD;  Location: [facility] ENDOSCOPY;  Service: General;  Laterality: N/A;   INTRAMEDULLARY (IM) NAIL INTERTROCHANTERIC Left 10/17/2020   Procedure: INTRAMEDULLARY (IM) NAIL INTERTROCHANTRIC;  Surgeon: Kennedy Bucker, MD;  Location: [facility] ORS;  Service: Orthopedics;  Laterality: Left;   IR CT HEAD LTD  01/15/2022   IR CT HEAD LTD  04/16/2023   IR CT  HEAD LTD  04/16/2023   IR INTRA CRAN STENT  04/16/2023   IR PERCUTANEOUS ART THROMBECTOMY/INFUSION INTRACRANIAL INC DIAG ANGIO  01/15/2022   IR PERCUTANEOUS ART THROMBECTOMY/INFUSION INTRACRANIAL INC DIAG ANGIO  04/16/2023   IR PTA INTRACRANIAL  01/15/2022   IR US GUIDE VASC ACCESS RIGHT  01/15/2022   PEG PLACEMENT N/A 04/30/2023   Procedure: PERCUTANEOUS ENDOSCOPIC GASTROSTOMY (PEG) PLACEMENT;  Surgeon: Violeta Gelinas, MD;  Location: [facility] ENDOSCOPY;  Service: General;  Laterality: N/A;   RADIOLOGY WITH ANESTHESIA N/A 01/15/2022   Procedure: RADIOLOGY WITH ANESTHESIA;  Surgeon: Julieanne Cotton, MD;  Location: [facility] OR;  Service: Radiology;  Laterality: N/A;   RADIOLOGY WITH ANESTHESIA N/A 04/16/2023   Procedure: IR WITH ANESTHESIA;  Surgeon: Radiologist, Medication, MD;  Location: [facility] OR;  Service: Radiology;  Laterality: N/A;   T9-T11 fusion in 1990     Social History:  reports that he quit smoking about 23 years ago. His smoking use included cigarettes. He started smoking about 53 years ago. He has a 45 pack-year smoking history. He has never used smokeless tobacco. He reports that he does not currently use alcohol. He reports that he does not use drugs.  Allergies  Allergen Reactions   Penicillins Other (See Comments)    Unknown     Family History  Problem Relation Age of Onset   Hearing loss Mother    Liver disease Brother    Hearing loss Paternal Grandmother     Prior to Admission medications   Medication Sig Start Date End Date Taking? Authorizing Provider  aspirin 81 MG chewable tablet Place 1 tablet (81  mg total) into feeding tube daily. 05/03/23   Lynnae January, NP  carvedilol (COREG) 3.125 MG tablet Take 1 tablet (3.125 mg total) by mouth 2 (two) times daily with a meal. 01/21/22   de Saintclair Halsted, Cortney E, NP  donepezil (ARICEPT) 10 MG tablet Take half tablet (5 mg) daily for 2 weeks, then increase to the full tablet at 10 mg daily 03/20/22   Marcos Eke, PA-C  donepezil (ARICEPT) 5  MG tablet Place 1 tablet (5 mg total) into feeding tube at bedtime. 05/02/23   Lynnae January, NP  QUEtiapine (SEROQUEL) 25 MG tablet Place 0.5 tablets (12.5 mg total) into feeding tube 2 (two) times daily. 05/02/23   Lynnae January, NP  rosuvastatin (CRESTOR) 20 MG tablet Place 1 tablet (20 mg total) into feeding tube daily. 05/03/23   Lynnae January, NP  ticagrelor (BRILINTA) 90 MG TABS tablet Place 1 tablet (90 mg total) into feeding tube 2 (two) times daily. 05/02/23   Lynnae January, NP    Physical Exam: Vitals:   06/09/23 1629 06/09/23 1637  BP:  137/79  Pulse:  70  Resp:  18  Temp:  98.3 F (36.8 C)  TempSrc:  Oral  SpO2: 98% 100%   Constitutional: NAD, calm, comfortable, chronically ill appearing elderly gentleman lying in bed. Able to track with eyes but aphasic and not able to follow simple commands.  Eyes:  lids and conjunctivae normal ENMT: Mucous membranes are moist.  Neck: normal, supple Respiratory: clear to auscultation bilaterally, no wheezing, no crackles. Normal respiratory effort. No accessory muscle use.  Cardiovascular: Regular rate and rhythm, no murmurs / rubs / gallops. No extremity edema. 2+ pedal pulses.   Abdomen: no tenderness, soft Musculoskeletal: no clubbing / cyanosis.  Normal muscle tone. Right LE held in flexion-grimace when attempts made to move right LE Skin: no rashes, lesions, ulcers. No induration Neurologic: Alert and tracks with eyes, able to reach out with left UE. Apasia. Not able to follow simple commands.  Psychiatric: Normal mood.   Data Reviewed:  See HPI  Assessment and Plan: * Right femoral fracture (HCC) -Pt reportedly fell out of wheelchair at his facility. Presented with internally rotated and shortening of right hip. Unclear event surrounding fall - X-ray demonstrated comminuted and displaced intertrochanteric right proximal femur fracture. Orthopedic consulted and will take to OR tomorrow.  -sister is aware that she will need to  consent tomorrow  Dysphagia S/p PEG tube -sister says facility nurse told her he no longer tries to chew when they try oral intake. Will keep NPO -dietician consult for tube feed nutrition needs -gentle IV fluids overnight  History of stroke Subdural hematoma -prior L MCA stroke with residual aphasia, recent left MCA infarct s/p IR mechanical thrombectomy with TICI3 and left M2 stenting (04/16/2023) -on DAPT with aspirin and Brilinta but MRI brain today demonstrated subacute to chronic subdural hematoma. ED PA discussed with neurosurgery Dr. Conchita Paris and he recommended holding for 3 weeks and then repeat imaging. I discussed with neurologist on call Dr. Derry Lory and he recommends holding DAPT overnight with discussion with interventional radiology in the morning regarding whether he needs to be continued on DAPT.    Vascular dementia (HCC) -pt aphasic at baseline due to recurrent left MCA strokes  HTN (hypertension) -Controlled. Resume antihypertensives once able to verify med rec with facility      Advance Care Planning: DNR- confirmed with sister who is his next of kin  Consults: neurosurgery,  curbsided neurology, need IR consult in the morning regarding DAPT  Family Communication: sister over the phone  Severity of Illness: The appropriate patient status for this patient is INPATIENT. Inpatient status is judged to be reasonable and necessary in order to provide the required intensity of service to ensure the patient's safety. The patient's presenting symptoms, physical exam findings, and initial radiographic and laboratory data in the context of their chronic comorbidities is felt to place them at high risk for further clinical deterioration. Furthermore, it is not anticipated that the patient will be medically stable for discharge from the hospital within 2 midnights of admission.   * I certify that at the point of admission it is my clinical judgment that the patient will  require inpatient hospital care spanning beyond 2 midnights from the point of admission due to high intensity of service, high risk for further deterioration and high frequency of surveillance required.*  Author: Anselm Jungling, DO 06/09/2023 10:58 PM  For on call review [email or web address].

## 2023-06-09 NOTE — ED Triage Notes (Signed)
Per ems, pt coming from [facility]  facility for a fall that occurred Sunday night. Uses at walker/wheelchair device at baseline. Had xrays Monday that showed positive right femur fracture. Not on blood thinners. Pt nonverbal at baseline with hx of stroke.

## 2023-06-09 NOTE — Assessment & Plan Note (Signed)
-  pt aphasic at baseline due to recurrent left MCA strokes

## 2023-06-09 NOTE — ED Provider Notes (Signed)
[place] EMERGENCY DEPARTMENT AT [facility] Provider Note   CSN: [id number] Arrival date & time: 06/09/23  1622     History Chief Complaint  Patient presents with   Marletta Lor         Casey Rangel is a 72 y.o. 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  History is limited due to patient's aphasia and dementia.  I did call the facility the patient resides at [facility] and I spoke with Lance Bosch, RN.  She reports that the fall happened Monday at 0200 and the patient fell out of his wheelchair and was found landing on the right side.  She reports that the aphasia is at his baseline.  They performed an x-ray there and showed that he had a fracture and sent him to the emergency department.  From looking at the [facility], patient is on Brilinta and aspirin but I do not see any anticoagulants.   Fall       Home Medications Prior to Admission medications   Medication Sig Start Date End Date Taking? Authorizing Provider  aspirin 81 MG chewable tablet Place 1 tablet (81 mg total) into feeding tube daily. 05/03/23   Lynnae January, NP  carvedilol (COREG) 3.125 MG tablet Take 1 tablet (3.125 mg total) by mouth 2 (two) times daily with a meal. 01/21/22   de Saintclair Halsted, Cortney E, NP  donepezil (ARICEPT) 10 MG tablet Take half tablet (5 mg) daily for 2 weeks, then increase to the full tablet at 10 mg daily 03/20/22   Marcos Eke, PA-C  donepezil (ARICEPT) 5 MG tablet Place 1 tablet (5 mg total) into feeding tube at bedtime. 05/02/23   Lynnae January, NP  QUEtiapine (SEROQUEL) 25 MG tablet Place 0.5 tablets (12.5 mg total) into feeding tube 2 (two) times daily. 05/02/23   Lynnae January, NP  rosuvastatin (CRESTOR) 20 MG tablet Place 1 tablet (20 mg total) into feeding tube daily. 05/03/23   Lynnae January, NP  ticagrelor (BRILINTA) 90 MG TABS tablet Place 1  tablet (90 mg total) into feeding tube 2 (two) times daily. 05/02/23   Lynnae January, NP      Allergies    Penicillins    Review of Systems   Review of Systems  Unable to perform ROS: Patient nonverbal    Physical Exam Updated Vital Signs BP 137/79 (BP Location: Left Arm)   Pulse 70   Temp 98.3 F (36.8 C) (Oral)   Resp 18   SpO2 100%  Physical Exam Vitals and nursing note reviewed.  Constitutional:      Comments: Chronically ill-appearing  Eyes:     General: No scleral icterus. Cardiovascular:     Rate and Rhythm: Normal rate.  Pulmonary:     Effort: Pulmonary effort is normal. No respiratory distress.  Abdominal:     Palpations: Abdomen is soft.     Tenderness: There is no abdominal tenderness. There is no guarding or rebound.     Comments: Does not elicit any painful response to palpation of the belly.  G-tube present  Musculoskeletal:     Comments: Right leg is shortened and externally rotated.  No overlying skin changes noted to the thigh.  Compartments are soft.  Palpable pulse.  I do not see any other signs of trauma to the upper or lower extremities.  No signs of trauma  noted to the belly or chest.  Unable to view patient's back due to cooperation and also limited range of motion due to the patient's known fracture of the hip.  Skin:    General: Skin is warm and dry.  Neurological:     Mental Status: He is alert.     Comments: Unable to perform majority of neurological exam given patient's agitation, aphasia, and dementia/cooperation.  He is moving his left arm freely and withdrawal his left foot to pain.     ED Results / Procedures / Treatments   Labs (all labs ordered are listed, but only abnormal results are displayed) Labs Reviewed - No data to display  EKG None  Radiology DG Chest 1 View  Result Date: 06/10/2023 CLINICAL DATA:  Recent fall with known right femur fracture EXAM: PORTABLE CHEST 1 VIEW COMPARISON:  04/19/2023 FINDINGS: Cardiac shadow  is stable. Tortuous thoracic aorta is noted. Lungs are clear bilaterally. No bony abnormality is noted. IMPRESSION: No active disease. Electronically Signed   By: Alcide Clever M.D.   On: 06/10/2023 00:05   CT Head Wo Contrast  Result Date: 06/09/2023 CLINICAL DATA:  Head trauma, minor (Age >= 65y); Neck trauma (Age >= 65y) EXAM: CT HEAD WITHOUT CONTRAST CT CERVICAL SPINE WITHOUT CONTRAST TECHNIQUE: Multidetector CT imaging of the head and cervical spine was performed following the standard protocol without intravenous contrast. Multiplanar CT image reconstructions of the cervical spine were also generated. RADIATION DOSE REDUCTION: This exam was performed according to the departmental dose-optimization program which includes automated exposure control, adjustment of the mA and/or kV according to patient size and/or use of iterative reconstruction technique. COMPARISON:  Head CT 04/16/2023, Brain MR 04/16/23 FINDINGS: CT HEAD FINDINGS Brain: There are bilateral subdural hematomas measuring up to 5 mm along the right cerebral convexity and 4 mm along the left cerebral convexity. These were present prior brain MRI dated 04/16/2023 There are chronic infarcts in the right thalamus in the left basal ganglia. Chronic right occipital lobe, right frontal lobe, and left parietal lobe infarct. No hydrocephalus. Mass effect. No mass lesion. Vascular: No hyperdense vessel or unexpected calcification. Redemonstrated vascular stent in the left M2 segment Skull: Normal. Negative for fracture or focal lesion. Sinuses/Orbits: Middle ear effusion. Trace right mastoid effusion. Mild mucosal left maxillary posterior right ethmoid air cells. Orbits are unremarkable. Other: None. CT CERVICAL SPINE FINDINGS Alignment: Normal. Skull base and vertebrae: No acute fracture. No primary bone lesion or focal pathologic process. Soft tissues and spinal canal: No prevertebral fluid or swelling. No visible canal hematoma. Disc levels:  No CT  evidence of high-grade spinal canal stenosis. Upper chest: 3 mm solid right upper lobe pulmonary nodule, likely infectious or inflammatory. Other: None IMPRESSION: 1. Bilateral subdural collections measuring up to 5 mm along the right cerebral convexity and 4 mm along the left cerebral convexity. No mass effect. 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 2. No acute fracture or traumatic subluxation of the cervical spine. Electronically Signed   By: Lorenza Cambridge M.D.   On: 06/09/2023 21:29   CT Cervical Spine Wo Contrast  Result Date: 06/09/2023 CLINICAL DATA:  Head trauma, minor (Age >= 65y); Neck trauma (Age >= 65y) EXAM: CT HEAD WITHOUT CONTRAST CT CERVICAL SPINE WITHOUT CONTRAST TECHNIQUE: Multidetector CT imaging of the head and cervical spine was performed following the standard protocol without intravenous contrast. Multiplanar CT image reconstructions of the cervical spine were also generated. RADIATION DOSE REDUCTION:  This exam was performed according to the departmental dose-optimization program which includes automated exposure control, adjustment of the mA and/or kV according to patient size and/or use of iterative reconstruction technique. COMPARISON:  Head CT 04/16/2023, Brain MR 04/16/23 FINDINGS: CT HEAD FINDINGS Brain: There are bilateral subdural hematomas measuring up to 5 mm along the right cerebral convexity and 4 mm along the left cerebral convexity. These were present prior brain MRI dated 04/16/2023 There are chronic infarcts in the right thalamus in the left basal ganglia. Chronic right occipital lobe, right frontal lobe, and left parietal lobe infarct. No hydrocephalus. Mass effect. No mass lesion. Vascular: No hyperdense vessel or unexpected calcification. Redemonstrated vascular stent in the left M2 segment Skull: Normal. Negative for fracture or focal lesion. Sinuses/Orbits: Middle ear effusion. Trace right mastoid effusion. Mild  mucosal left maxillary posterior right ethmoid air cells. Orbits are unremarkable. Other: None. CT CERVICAL SPINE FINDINGS Alignment: Normal. Skull base and vertebrae: No acute fracture. No primary bone lesion or focal pathologic process. Soft tissues and spinal canal: No prevertebral fluid or swelling. No visible canal hematoma. Disc levels:  No CT evidence of high-grade spinal canal stenosis. Upper chest: 3 mm solid right upper lobe pulmonary nodule, likely infectious or inflammatory. Other: None IMPRESSION: 1. Bilateral subdural collections measuring up to 5 mm along the right cerebral convexity and 4 mm along the left cerebral convexity. No mass effect. 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 2. No acute fracture or traumatic subluxation of the cervical spine. Electronically Signed   By: Lorenza Cambridge M.D.   On: 06/09/2023 21:29   DG Femur Min 2 Views Right  Result Date: 06/09/2023 CLINICAL DATA:  Pain after fall. EXAM: RIGHT FEMUR 2 VIEWS; PELVIS - 1-2 VIEW COMPARISON:  None Available. FINDINGS: Pelvis: Comminuted and displaced intertrochanteric right proximal femur fracture. No hip dislocation. Mild underlying right hip osteoarthritis. Left proximal femur hardware is partially included in the field of view. The bones are diffusely under mineralized. No pubic symphyseal or sacroiliac diastasis. No pubic rami fractures. Right femur: No additional fracture of the femur. The distal femur is intact. Knee alignment is grossly maintained on provided views, although suboptimally assessed. The bones are subjectively under mineralized. Vascular calcifications. IMPRESSION: 1. Comminuted and displaced intertrochanteric right proximal femur fracture. 2. No additional fracture of the right femur. 3. Osteopenia/osteoporosis. Electronically Signed   By: Narda Rutherford M.D.   On: 06/09/2023 19:57   DG Pelvis 1-2 Views  Result Date: 06/09/2023 CLINICAL DATA:   Pain after fall. EXAM: RIGHT FEMUR 2 VIEWS; PELVIS - 1-2 VIEW COMPARISON:  None Available. FINDINGS: Pelvis: Comminuted and displaced intertrochanteric right proximal femur fracture. No hip dislocation. Mild underlying right hip osteoarthritis. Left proximal femur hardware is partially included in the field of view. The bones are diffusely under mineralized. No pubic symphyseal or sacroiliac diastasis. No pubic rami fractures. Right femur: No additional fracture of the femur. The distal femur is intact. Knee alignment is grossly maintained on provided views, although suboptimally assessed. The bones are subjectively under mineralized. Vascular calcifications. IMPRESSION: 1. Comminuted and displaced intertrochanteric right proximal femur fracture. 2. No additional fracture of the right femur. 3. Osteopenia/osteoporosis. Electronically Signed   By: Narda Rutherford M.D.   On: 06/09/2023 19:57    Procedures Procedures   Medications Ordered in ED Medications - No data to display  ED Course/ Medical Decision Making/ A&P Clinical Course as of 06/10/23 0057  Tue Jun 09, 2023  2104 On the phone with Dr. Patric Dykes,  [RR]    Clinical Course User Index [RR] Achille Rich, PA-C   Medical Decision Making Amount and/or Complexity of Data Reviewed Labs: ordered. Radiology: ordered.  Risk Decision regarding hospitalization.   72 y.o. male presents to the ER for evaluation of fall. Differential diagnosis includes but is not limited to trauma. Vital signs mildly elevated blood pressure otherwise normal. Physical exam as noted above.   I independently reviewed and interpreted the patient's labs.  BC does show slight leukocytosis of 13.9 with an elevated neutrophil count.  To be acute phase reaction or stress reaction.  CMP does show bicarb of 21 from normal anion gap.  Glucose at 105.  BUN at 25.  Creatinine at baseline.  Mildly decreased calcium, total protein, and albumin.  Total bili 1.3.  Otherwise, no  electrolyte or LFT abnormality.  Patient's vital signs are stable with mildly elevated blood pressure otherwise afebrile satting well room air without increased work of breathing.  XR shows 1. Comminuted and displaced intertrochanteric right proximal femur fracture. 2. No additional fracture of the right femur. 3. Osteopenia/osteoporosis. Per radiologist's read.   CT of the head and c-spine shows  1. Bilateral subdural collections measuring up to 5 mm along the right cerebral convexity and 4 mm along the left cerebral convexity. No mass effect. 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 2. No acute fracture or traumatic subluxation of the cervical spine.  I spoke with Dr. Odis Hollingshead with orthopedics.  Plan is for surgery tomorrow morning.  Asked patient remain n.p.o. at midnight.  There was a delay in getting the CT imaging read.  I could see a possible subdural on the patient's CT imaging.  I did call neurosurgery and spoke with Dr. Conchita Paris.  He looked over the CT imaging and reports that these are subacute/old subdurals and do not need any follow-up imaging.  He recommends the patient not be on the DAPT unless the patient was receiving it based on recent cardiac stent placement.  If the patient does not have any recent cardiac stent placement and the DAPT therapy was for his prior strokes, he does not recommend continuing this until he is reseen in the clinic.  Blood follow-up with the patient's in 3 to 4 weeks outpatient for repeat imaging and possible restarting this medication.  History and physical difficult due to patient's aphasia as well as dementia.  He does not follow commands well however is alert.  He does appear agitated and tries to eat with his left hand.  He would not follow command to move the left foot however the patient does react to painful stimuli to the left foot and does move his foot some.  Compartments are soft.  He does move  his right arm however does appear to be contracted.  Does move his left arm freely.  Will admit to hospitalist for right hip fracture repair.  Portions of this report may have been transcribed using voice recognition software. Every effort was made to ensure accuracy; however, inadvertent computerized transcription errors may be present.   Final Clinical Impression(s) / ED Diagnoses Final diagnoses:  Closed fracture of right hip, initial encounter [facility])    Rx / DC Orders ED Discharge Orders     None         Achille Rich, PA-C 06/10/23 0111    Benjiman Core, MD 06/18/23 1309

## 2023-06-09 NOTE — Assessment & Plan Note (Signed)
-  Controlled. Resume antihypertensives once able to verify med rec with facility

## 2023-06-09 NOTE — Assessment & Plan Note (Signed)
-  Pt reportedly fell out of wheelchair at his facility. Presented with internally rotated and shortening of right hip. Unclear event surrounding fall - X-ray demonstrated comminuted and displaced intertrochanteric right proximal femur fracture. Orthopedic consulted and will take to OR tomorrow.  -sister is aware that she will need to consent tomorrow

## 2023-06-09 NOTE — Assessment & Plan Note (Addendum)
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 ED PA discussed with neurosurgery Dr. Conchita Paris and he recommended holding for 3 weeks and then repeat imaging. I discussed with neurologist on call Dr. Derry Lory and he recommends holding DAPT overnight with discussion with interventional radiology in the morning regarding whether he needs to be continued on DAPT.

## 2023-06-09 NOTE — ED Notes (Signed)
Phlebotomy collected labs, patient calm and cooperative throughout and followed commands.

## 2023-06-10 ENCOUNTER — Inpatient Hospital Stay (HOSPITAL_COMMUNITY): Payer: Medicare Other

## 2023-06-10 ENCOUNTER — Other Ambulatory Visit: Payer: Self-pay

## 2023-06-10 ENCOUNTER — Encounter (HOSPITAL_COMMUNITY)
Admission: EM | Disposition: A | Discharge status: Skilled Nursing Facility | Payer: Self-pay | Source: Skilled Nursing Facility | Attending: Internal Medicine

## 2023-06-10 ENCOUNTER — Encounter (HOSPITAL_COMMUNITY): Payer: Self-pay | Admitting: Family Medicine

## 2023-06-10 DIAGNOSIS — S72141A Displaced intertrochanteric fracture of right femur, initial encounter for closed fracture: Secondary | ICD-10-CM

## 2023-06-10 DIAGNOSIS — I10 Essential (primary) hypertension: Secondary | ICD-10-CM

## 2023-06-10 DIAGNOSIS — S72001D Fracture of unspecified part of neck of right femur, subsequent encounter for closed fracture with routine healing: Secondary | ICD-10-CM

## 2023-06-10 HISTORY — PX: INTRAMEDULLARY (IM) NAIL INTERTROCHANTERIC: SHX5875

## 2023-06-10 LAB — URINALYSIS, ROUTINE W REFLEX MICROSCOPIC
Bacteria, UA: NONE SEEN
Bilirubin Urine: NEGATIVE
Glucose, UA: NEGATIVE mg/dL
Hgb urine dipstick: NEGATIVE
Ketones, ur: NEGATIVE mg/dL
Leukocytes,Ua: NEGATIVE
Nitrite: NEGATIVE
Protein, ur: 30 mg/dL — AB
Specific Gravity, Urine: 1.031 — ABNORMAL HIGH (ref 1.005–1.030)
pH: 5 (ref 5.0–8.0)

## 2023-06-10 LAB — SURGICAL PCR SCREEN
MRSA, PCR: NEGATIVE
Staphylococcus aureus: NEGATIVE

## 2023-06-10 SURGERY — FIXATION, FRACTURE, INTERTROCHANTERIC, WITH INTRAMEDULLARY ROD
Anesthesia: General | Site: Leg Upper | Laterality: Right

## 2023-06-10 MED ORDER — FENTANYL CITRATE (PF) 250 MCG/5ML IJ SOLN
INTRAMUSCULAR | Status: AC
  Filled 2023-06-10: qty 5

## 2023-06-10 MED ORDER — CEFAZOLIN SODIUM-DEXTROSE 2-4 GM/100ML-% IV SOLN
2.0000 g | INTRAVENOUS | Status: AC
Start: 2023-06-10 — End: 2023-06-10
  Administered 2023-06-10: 2 g via INTRAVENOUS
  Filled 2023-06-10: qty 100

## 2023-06-10 MED ORDER — LACTATED RINGERS IV SOLN: INTRAVENOUS | Status: DC | PRN

## 2023-06-10 MED ORDER — HYDROCODONE-ACETAMINOPHEN 5-325 MG PO TABS
1.0000 | ORAL_TABLET | Freq: Four times a day (QID) | ORAL | Status: DC | PRN
  Administered 2023-06-10: 2
  Administered 2023-06-11 – 2023-06-12 (×2): 1
  Administered 2023-06-12: 2
  Filled 2023-06-10 (×2): qty 1
  Filled 2023-06-10 (×2): qty 2

## 2023-06-10 MED ORDER — DEXAMETHASONE SODIUM PHOSPHATE 10 MG/ML IJ SOLN
INTRAMUSCULAR | Status: DC | PRN
  Administered 2023-06-10: 4 mg via INTRAVENOUS

## 2023-06-10 MED ORDER — CEFAZOLIN SODIUM-DEXTROSE 2-4 GM/100ML-% IV SOLN
2.0000 g | Freq: Three times a day (TID) | INTRAVENOUS | Status: AC
  Administered 2023-06-10 – 2023-06-11 (×3): 2 g via INTRAVENOUS
  Filled 2023-06-10 (×3): qty 100

## 2023-06-10 MED ORDER — CHLORHEXIDINE GLUCONATE 4 % EX SOLN: 60.0000 mL | Freq: Once | CUTANEOUS | Status: DC

## 2023-06-10 MED ORDER — CARVEDILOL 3.125 MG PO TABS
3.1250 mg | ORAL_TABLET | Freq: Two times a day (BID) | ORAL | Status: DC
  Administered 2023-06-10 – 2023-06-12 (×4): 3.125 mg
  Filled 2023-06-10 (×4): qty 1

## 2023-06-10 MED ORDER — QUETIAPINE FUMARATE 25 MG PO TABS
12.5000 mg | ORAL_TABLET | Freq: Two times a day (BID) | ORAL | Status: DC
  Administered 2023-06-10 – 2023-06-12 (×4): 12.5 mg
  Filled 2023-06-10 (×4): qty 1

## 2023-06-10 MED ORDER — PROPOFOL 10 MG/ML IV BOLUS
INTRAVENOUS | Status: DC | PRN
  Administered 2023-06-10: 60 mg via INTRAVENOUS

## 2023-06-10 MED ORDER — ONDANSETRON HCL 4 MG/2ML IJ SOLN: 4.0000 mg | Freq: Once | INTRAMUSCULAR | Status: DC | PRN

## 2023-06-10 MED ORDER — LIDOCAINE 2% (20 MG/ML) 5 ML SYRINGE
INTRAMUSCULAR | Status: AC
Start: 2023-06-10 — End: ?
  Filled 2023-06-10: qty 5

## 2023-06-10 MED ORDER — LIDOCAINE 2% (20 MG/ML) 5 ML SYRINGE
INTRAMUSCULAR | Status: DC | PRN
  Administered 2023-06-10: 100 mg via INTRAVENOUS

## 2023-06-10 MED ORDER — ACETAMINOPHEN 160 MG/5ML PO SOLN: 650.0000 mg | Freq: Four times a day (QID) | ORAL | Status: DC | PRN

## 2023-06-10 MED ORDER — DONEPEZIL HCL 10 MG PO TABS
10.0000 mg | ORAL_TABLET | Freq: Every day | ORAL | Status: DC
  Administered 2023-06-10 – 2023-06-11 (×2): 10 mg
  Filled 2023-06-10 (×2): qty 1

## 2023-06-10 MED ORDER — OXYCODONE HCL 5 MG/5ML PO SOLN: 5.0000 mg | Freq: Once | ORAL | Status: DC | PRN

## 2023-06-10 MED ORDER — ONDANSETRON HCL 4 MG/2ML IJ SOLN
INTRAMUSCULAR | Status: DC | PRN
  Administered 2023-06-10: 4 mg via INTRAVENOUS

## 2023-06-10 MED ORDER — DEXAMETHASONE SODIUM PHOSPHATE 10 MG/ML IJ SOLN
INTRAMUSCULAR | Status: AC
  Filled 2023-06-10: qty 1

## 2023-06-10 MED ORDER — ALBUMIN HUMAN 5 % IV SOLN
INTRAVENOUS | Status: DC | PRN
Start: 2023-06-10 — End: 2023-06-10

## 2023-06-10 MED ORDER — ROCURONIUM BROMIDE 10 MG/ML (PF) SYRINGE
PREFILLED_SYRINGE | INTRAVENOUS | Status: DC | PRN
  Administered 2023-06-10: 50 mg via INTRAVENOUS

## 2023-06-10 MED ORDER — ONDANSETRON HCL 4 MG/2ML IJ SOLN
INTRAMUSCULAR | Status: AC
  Filled 2023-06-10: qty 2

## 2023-06-10 MED ORDER — CHLORHEXIDINE GLUCONATE 0.12 % MT SOLN
OROMUCOSAL | Status: AC
  Administered 2023-06-10: 15 mL
  Filled 2023-06-10: qty 15

## 2023-06-10 MED ORDER — PHENYLEPHRINE 80 MCG/ML (10ML) SYRINGE FOR IV PUSH (FOR BLOOD PRESSURE SUPPORT)
PREFILLED_SYRINGE | INTRAVENOUS | Status: DC | PRN
  Administered 2023-06-10: 80 ug via INTRAVENOUS
  Administered 2023-06-10: 160 ug via INTRAVENOUS
  Administered 2023-06-10: 80 ug via INTRAVENOUS

## 2023-06-10 MED ORDER — POVIDONE-IODINE 10 % EX SWAB
2.0000 | Freq: Once | CUTANEOUS | Status: AC
  Administered 2023-06-10: 2 via TOPICAL

## 2023-06-10 MED ORDER — ROCURONIUM BROMIDE 10 MG/ML (PF) SYRINGE
PREFILLED_SYRINGE | INTRAVENOUS | Status: AC
  Filled 2023-06-10: qty 10

## 2023-06-10 MED ORDER — OXYCODONE HCL 5 MG PO TABS: 5.0000 mg | ORAL_TABLET | Freq: Once | ORAL | Status: DC | PRN

## 2023-06-10 MED ORDER — FENTANYL CITRATE (PF) 100 MCG/2ML IJ SOLN: 25.0000 ug | INTRAMUSCULAR | Status: DC | PRN

## 2023-06-10 MED ORDER — FENTANYL CITRATE (PF) 250 MCG/5ML IJ SOLN
INTRAMUSCULAR | Status: DC | PRN
  Administered 2023-06-10 (×2): 50 ug via INTRAVENOUS

## 2023-06-10 MED ORDER — SUGAMMADEX SODIUM 200 MG/2ML IV SOLN
INTRAVENOUS | Status: DC | PRN
  Administered 2023-06-10: 20 mg via INTRAVENOUS

## 2023-06-10 MED ORDER — 0.9 % SODIUM CHLORIDE (POUR BTL) OPTIME
TOPICAL | Status: DC | PRN
  Administered 2023-06-10: 1000 mL

## 2023-06-10 MED ORDER — QUETIAPINE FUMARATE 25 MG PO TABS: 12.5000 mg | ORAL_TABLET | Freq: Two times a day (BID) | ORAL | Status: DC

## 2023-06-10 MED ORDER — PHENYLEPHRINE 80 MCG/ML (10ML) SYRINGE FOR IV PUSH (FOR BLOOD PRESSURE SUPPORT)
PREFILLED_SYRINGE | INTRAVENOUS | Status: AC
  Filled 2023-06-10: qty 10

## 2023-06-10 SURGICAL SUPPLY — 48 items
ADH SKN CLS APL DERMABOND .7 (GAUZE/BANDAGES/DRESSINGS) ×1
APL PRP STRL LF DISP 70% ISPRP (MISCELLANEOUS) ×1
BAG COUNTER SPONGE SURGICOUNT (BAG) IMPLANT
BAG SPNG CNTER NS LX DISP (BAG)
BIT DRILL INTERTAN LAG SCREW (BIT) IMPLANT
BIT DRILL LONG 4.0 (BIT) IMPLANT
BRUSH SCRUB EZ PLAIN DRY (MISCELLANEOUS) ×2 IMPLANT
CHLORAPREP W/TINT 26 (MISCELLANEOUS) ×1 IMPLANT
COVER PERINEAL POST (MISCELLANEOUS) ×1 IMPLANT
COVER SURGICAL LIGHT HANDLE (MISCELLANEOUS) ×1 IMPLANT
DERMABOND ADVANCED .7 DNX12 (GAUZE/BANDAGES/DRESSINGS) ×1 IMPLANT
DRAPE C-ARM 35X43 STRL (DRAPES) ×1 IMPLANT
DRAPE IMP U-DRAPE 54X76 (DRAPES) ×2 IMPLANT
DRAPE INCISE IOBAN 66X45 STRL (DRAPES) ×1 IMPLANT
DRAPE STERI IOBAN 125X83 (DRAPES) ×1 IMPLANT
DRAPE SURG 17X23 STRL (DRAPES) ×2 IMPLANT
DRAPE U-SHAPE 47X51 STRL (DRAPES) ×1 IMPLANT
DRESSING MEPILEX FLEX 4X4 (GAUZE/BANDAGES/DRESSINGS) ×1 IMPLANT
DRILL BIT LONG 4.0 (BIT) ×1
DRSG MEPILEX FLEX 4X4 (GAUZE/BANDAGES/DRESSINGS) ×1
DRSG MEPILEX POST OP 4X8 (GAUZE/BANDAGES/DRESSINGS) ×1 IMPLANT
ELECT REM PT RETURN 9FT ADLT (ELECTROSURGICAL) ×1
ELECTRODE REM PT RTRN 9FT ADLT (ELECTROSURGICAL) ×1 IMPLANT
GLOVE BIO SURGEON STRL SZ 6.5 (GLOVE) ×3 IMPLANT
GLOVE BIO SURGEON STRL SZ7.5 (GLOVE) ×4 IMPLANT
GLOVE BIOGEL PI IND STRL 6.5 (GLOVE) ×1 IMPLANT
GLOVE BIOGEL PI IND STRL 7.5 (GLOVE) ×1 IMPLANT
GOWN STRL REUS W/ TWL LRG LVL3 (GOWN DISPOSABLE) ×1 IMPLANT
GOWN STRL REUS W/TWL LRG LVL3 (GOWN DISPOSABLE) ×1
GUIDE PIN 3.2X343 (PIN) ×2
GUIDE PIN 3.2X343MM (PIN) ×2
KIT BASIN OR (CUSTOM PROCEDURE TRAY) ×1 IMPLANT
KIT TURNOVER KIT B (KITS) ×1 IMPLANT
MANIFOLD NEPTUNE II (INSTRUMENTS) ×1 IMPLANT
NAIL INTERTAN 10X18 130D 10S (Nail) IMPLANT
NS IRRIG 1000ML POUR BTL (IV SOLUTION) ×1 IMPLANT
PACK GENERAL/GYN (CUSTOM PROCEDURE TRAY) ×1 IMPLANT
PAD ARMBOARD 7.5X6 YLW CONV (MISCELLANEOUS) ×2 IMPLANT
PIN GUIDE 3.2X343MM (PIN) IMPLANT
SCREW LAG COMPR KIT 105/100 (Screw) IMPLANT
SCREW TRIGEN LOW PROF 5.0X37.5 (Screw) IMPLANT
SUT MNCRL AB 3-0 PS2 18 (SUTURE) ×1 IMPLANT
SUT VIC AB 0 CT1 27 (SUTURE)
SUT VIC AB 0 CT1 27XBRD ANBCTR (SUTURE) IMPLANT
SUT VIC AB 2-0 CT1 27 (SUTURE)
SUT VIC AB 2-0 CT1 TAPERPNT 27 (SUTURE) ×2 IMPLANT
TOWEL GREEN STERILE (TOWEL DISPOSABLE) ×2 IMPLANT
WATER STERILE IRR 1000ML POUR (IV SOLUTION) ×1 IMPLANT

## 2023-06-10 NOTE — Transfer of Care (Signed)
Immediate Anesthesia Transfer of Care Note  Patient: Casey Rangel  Procedure(s) Performed: INTRAMEDULLARY (IM) NAIL INTERTROCHANTERIC (Right: Leg Upper)  Patient Location: PACU  Anesthesia Type:General  Level of Consciousness: drowsy  Airway & Oxygen Therapy: Patient Spontanous Breathing  Post-op Assessment: Report given to RN and Post -op Vital signs reviewed and stable  Post vital signs: Reviewed and stable  Last Vitals:  Vitals Value Taken Time  BP 148/76 06/10/23 1649  Temp 37.1 C 06/10/23 1649  Pulse 86 06/10/23 1700  Resp 22 06/10/23 1700  SpO2 93 % 06/10/23 1700  Vitals shown include unfiled device data.  Last Pain:  Vitals:   06/10/23 1649  TempSrc:   PainSc: 0-No pain         Complications: No notable events documented.

## 2023-06-10 NOTE — Consult Note (Signed)
Reason for Consult:Right hip fx Referring Physician: Jetty Duhamel Time called: 0730 Time at bedside: 0914   Casey Rangel is an 72 y.o. male.  HPI: Shloma fell out of his WC at the SNF where he [facility]. He was brought to the ED where x-rays showed a right hip fx and orthopedic surgery was consulted. Due to scheduling issues orthopedic trauma consultation was requested. He is nonverbal and cannot contribute to history.  Past Medical History:  Diagnosis Date   GERD (gastroesophageal reflux disease)    Hiatal hernia    Stroke [facility])     Past Surgical History:  Procedure Laterality Date   ESOPHAGOGASTRODUODENOSCOPY (EGD) WITH PROPOFOL N/A 04/30/2023   Procedure: ESOPHAGOGASTRODUODENOSCOPY (EGD) WITH PROPOFOL;  Surgeon: Violeta Gelinas, MD;  Location: [facility] ENDOSCOPY;  Service: General;  Laterality: N/A;   INTRAMEDULLARY (IM) NAIL INTERTROCHANTERIC Left 10/17/2020   Procedure: INTRAMEDULLARY (IM) NAIL INTERTROCHANTRIC;  Surgeon: Kennedy Bucker, MD;  Location: [facility] ORS;  Service: Orthopedics;  Laterality: Left;   IR CT HEAD LTD  01/15/2022   IR CT HEAD LTD  04/16/2023   IR CT HEAD LTD  04/16/2023   IR INTRA CRAN STENT  04/16/2023   IR PERCUTANEOUS ART THROMBECTOMY/INFUSION INTRACRANIAL INC DIAG ANGIO  01/15/2022   IR PERCUTANEOUS ART THROMBECTOMY/INFUSION INTRACRANIAL INC DIAG ANGIO  04/16/2023   IR PTA INTRACRANIAL  01/15/2022   IR US GUIDE VASC ACCESS RIGHT  01/15/2022   PEG PLACEMENT N/A 04/30/2023   Procedure: PERCUTANEOUS ENDOSCOPIC GASTROSTOMY (PEG) PLACEMENT;  Surgeon: Violeta Gelinas, MD;  Location: [facility] ENDOSCOPY;  Service: General;  Laterality: N/A;   RADIOLOGY WITH ANESTHESIA N/A 01/15/2022   Procedure: RADIOLOGY WITH ANESTHESIA;  Surgeon: Julieanne Cotton, MD;  Location: [facility] OR;  Service: Radiology;  Laterality: N/A;   RADIOLOGY WITH ANESTHESIA N/A 04/16/2023   Procedure: IR WITH ANESTHESIA;  Surgeon: Radiologist, Medication, MD;  Location: [facility] OR;  Service: Radiology;  Laterality: N/A;    T9-T11 fusion in 1990      Family History  Problem Relation Age of Onset   Hearing loss Mother    Liver disease Brother    Hearing loss Paternal Grandmother     Social History:  reports that he quit smoking about 23 years ago. His smoking use included cigarettes. He started smoking about 53 years ago. He has a 45 pack-year smoking history. He has never used smokeless tobacco. He reports that he does not currently use alcohol. He reports that he does not use drugs.  Allergies:  Allergies  Allergen Reactions   Penicillins Other (See Comments)    Unknown     Medications: I have reviewed the patient's current medications.  Results for orders placed or performed during the hospital encounter of 06/09/23 (from the past 48 hour(s))  CBC with Differential     Status: Abnormal   Collection Time: 06/09/23  9:59 PM  Result Value Ref Range   WBC 13.9 (H) 4.0 - 10.5 K/uL   RBC 4.25 4.22 - 5.81 MIL/uL   Hemoglobin 13.5 13.0 - 17.0 g/dL   HCT 16.1 09.6 - 04.5 %   MCV 92.2 80.0 - 100.0 fL   MCH 31.8 26.0 - 34.0 pg   MCHC 34.4 30.0 - 36.0 g/dL   RDW 40.9 81.1 - 91.4 %   Platelets 267 150 - 400 K/uL   nRBC 0.0 0.0 - 0.2 %   Neutrophils Relative % 78 %   Neutro Abs 10.9 (H) 1.7 - 7.7 K/uL   Lymphocytes Relative 10 %   Lymphs Abs  1.4 0.7 - 4.0 K/uL   Monocytes Relative 7 %   Monocytes Absolute 0.9 0.1 - 1.0 K/uL   Eosinophils Relative 4 %   Eosinophils Absolute 0.6 (H) 0.0 - 0.5 K/uL   Basophils Relative 1 %   Basophils Absolute 0.1 0.0 - 0.1 K/uL   Immature Granulocytes 0 %   Abs Immature Granulocytes 0.05 0.00 - 0.07 K/uL    Comment: Performed at [facility], [address], [place], [place] [address]  Comprehensive metabolic panel     Status: Abnormal   Collection Time: 06/09/23  9:59 PM  Result Value Ref Range   Sodium 138 135 - 145 mmol/L   Potassium 3.9 3.5 - 5.1 mmol/L   Chloride 107 98 - 111 mmol/L   CO2 21 (L) 22 - 32 mmol/L   Glucose, Bld 105 (H) 70 - 99 mg/dL     Comment: Glucose reference range applies only to samples taken after fasting for at least 8 hours.   BUN 25 (H) 8 - 23 mg/dL   Creatinine, Ser 6.04 0.61 - 1.24 mg/dL   Calcium 8.5 (L) 8.9 - 10.3 mg/dL   Total Protein 6.1 (L) 6.5 - 8.1 g/dL   Albumin 2.9 (L) 3.5 - 5.0 g/dL   AST 21 15 - 41 U/L   ALT 29 0 - 44 U/L   Alkaline Phosphatase 69 38 - 126 U/L   Total Bilirubin 1.3 (H) 0.3 - 1.2 mg/dL   GFR, Estimated >54 >09 mL/min    Comment: (NOTE) Calculated using the CKD-EPI Creatinine Equation (2021)    Anion gap 10 5 - 15    Comment: Performed at [facility], [address], [place], [place] [address]  Urinalysis, Routine w reflex microscopic -Urine, Catheterized     Status: Abnormal   Collection Time: 06/10/23  2:22 AM  Result Value Ref Range   Color, Urine AMBER (A) YELLOW    Comment: BIOCHEMICALS MAY BE AFFECTED BY COLOR   APPearance CLEAR CLEAR   Specific Gravity, Urine 1.031 (H) 1.005 - 1.030   pH 5.0 5.0 - 8.0   Glucose, UA NEGATIVE NEGATIVE mg/dL   Hgb urine dipstick NEGATIVE NEGATIVE   Bilirubin Urine NEGATIVE NEGATIVE   Ketones, ur NEGATIVE NEGATIVE mg/dL   Protein, ur 30 (A) NEGATIVE mg/dL   Nitrite NEGATIVE NEGATIVE   Leukocytes,Ua NEGATIVE NEGATIVE   RBC / HPF 0-5 0 - 5 RBC/hpf   WBC, UA 0-5 0 - 5 WBC/hpf   Bacteria, UA NONE SEEN NONE SEEN   Squamous Epithelial / HPF 0-5 0 - 5 /HPF   Mucus PRESENT     Comment: Performed at [facility], [address], [place], [place] [address]  Surgical pcr screen     Status: None   Collection Time: 06/10/23  3:57 AM   Specimen: Nasal Mucosa; Nasal Swab  Result Value Ref Range   MRSA, PCR NEGATIVE NEGATIVE   Staphylococcus aureus NEGATIVE NEGATIVE    Comment: (NOTE) The Xpert SA Assay (FDA approved for NASAL specimens in patients 38 years of age and older), is one component of a comprehensive surveillance program. It is not intended to diagnose infection nor to guide or monitor treatment. Performed at [facility], [address], [place], [place] [address]     DG Chest 1 View  Result Date: 06/10/2023 CLINICAL DATA:  Recent fall with known right femur fracture EXAM: PORTABLE CHEST 1 VIEW COMPARISON:  04/19/2023 FINDINGS: Cardiac shadow is  stable. Tortuous thoracic aorta is noted. Lungs are clear bilaterally. No bony abnormality is noted. IMPRESSION: No active disease. Electronically Signed   By: Alcide Clever M.D.   On: 06/10/2023 00:05   CT Head Wo Contrast  Result Date: 06/09/2023 CLINICAL DATA:  Head trauma, minor (Age >= 65y); Neck trauma (Age >= 65y) EXAM: CT HEAD WITHOUT CONTRAST CT CERVICAL SPINE WITHOUT CONTRAST TECHNIQUE: Multidetector CT imaging of the head and cervical spine was performed following the standard protocol without intravenous contrast. Multiplanar CT image reconstructions of the cervical spine were also generated. RADIATION DOSE REDUCTION: This exam was performed according to the departmental dose-optimization program which includes automated exposure control, adjustment of the mA and/or kV according to patient size and/or use of iterative reconstruction technique. COMPARISON:  Head CT 04/16/2023, Brain MR 04/16/23 FINDINGS: CT HEAD FINDINGS Brain: There are bilateral subdural hematomas measuring up to 5 mm along the right cerebral convexity and 4 mm along the left cerebral convexity. These were present prior brain MRI dated 04/16/2023 There are chronic infarcts in the right thalamus in the left basal ganglia. Chronic right occipital lobe, right frontal lobe, and left parietal lobe infarct. No hydrocephalus. Mass effect. No mass lesion. Vascular: No hyperdense vessel or unexpected calcification. Redemonstrated vascular stent in the left M2 segment Skull: Normal. Negative for fracture or focal lesion. Sinuses/Orbits: Middle ear effusion. Trace right mastoid effusion. Mild mucosal left maxillary posterior right ethmoid air cells. Orbits are unremarkable. Other: None. CT CERVICAL SPINE  FINDINGS Alignment: Normal. Skull base and vertebrae: No acute fracture. No primary bone lesion or focal pathologic process. Soft tissues and spinal canal: No prevertebral fluid or swelling. No visible canal hematoma. Disc levels:  No CT evidence of high-grade spinal canal stenosis. Upper chest: 3 mm solid right upper lobe pulmonary nodule, likely infectious or inflammatory. Other: None IMPRESSION: 1. Bilateral subdural collections measuring up to 5 mm along the right cerebral convexity and 4 mm along the left cerebral convexity. No mass effect. 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 2. No acute fracture or traumatic subluxation of the cervical spine. Electronically Signed   By: Lorenza Cambridge M.D.   On: 06/09/2023 21:29   CT Cervical Spine Wo Contrast  Result Date: 06/09/2023 CLINICAL DATA:  Head trauma, minor (Age >= 65y); Neck trauma (Age >= 65y) EXAM: CT HEAD WITHOUT CONTRAST CT CERVICAL SPINE WITHOUT CONTRAST TECHNIQUE: Multidetector CT imaging of the head and cervical spine was performed following the standard protocol without intravenous contrast. Multiplanar CT image reconstructions of the cervical spine were also generated. RADIATION DOSE REDUCTION: This exam was performed according to the departmental dose-optimization program which includes automated exposure control, adjustment of the mA and/or kV according to patient size and/or use of iterative reconstruction technique. COMPARISON:  Head CT 04/16/2023, Brain MR 04/16/23 FINDINGS: CT HEAD FINDINGS Brain: There are bilateral subdural hematomas measuring up to 5 mm along the right cerebral convexity and 4 mm along the left cerebral convexity. These were present prior brain MRI dated 04/16/2023 There are chronic infarcts in the right thalamus in the left basal ganglia. Chronic right occipital lobe, right frontal lobe, and left parietal lobe infarct. No hydrocephalus. Mass effect. No mass lesion.  Vascular: No hyperdense vessel or unexpected calcification. Redemonstrated vascular stent in the left M2 segment Skull: Normal. Negative for fracture or focal lesion. Sinuses/Orbits: Middle ear effusion. Trace right mastoid effusion. Mild mucosal left maxillary posterior right ethmoid air cells. Orbits are unremarkable. Other:  None. CT CERVICAL SPINE FINDINGS Alignment: Normal. Skull base and vertebrae: No acute fracture. No primary bone lesion or focal pathologic process. Soft tissues and spinal canal: No prevertebral fluid or swelling. No visible canal hematoma. Disc levels:  No CT evidence of high-grade spinal canal stenosis. Upper chest: 3 mm solid right upper lobe pulmonary nodule, likely infectious or inflammatory. Other: None IMPRESSION: 1. Bilateral subdural collections measuring up to 5 mm along the right cerebral convexity and 4 mm along the left cerebral convexity. No mass effect. 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 2. No acute fracture or traumatic subluxation of the cervical spine. Electronically Signed   By: Lorenza Cambridge M.D.   On: 06/09/2023 21:29   DG Femur Min 2 Views Right  Result Date: 06/09/2023 CLINICAL DATA:  Pain after fall. EXAM: RIGHT FEMUR 2 VIEWS; PELVIS - 1-2 VIEW COMPARISON:  None Available. FINDINGS: Pelvis: Comminuted and displaced intertrochanteric right proximal femur fracture. No hip dislocation. Mild underlying right hip osteoarthritis. Left proximal femur hardware is partially included in the field of view. The bones are diffusely under mineralized. No pubic symphyseal or sacroiliac diastasis. No pubic rami fractures. Right femur: No additional fracture of the femur. The distal femur is intact. Knee alignment is grossly maintained on provided views, although suboptimally assessed. The bones are subjectively under mineralized. Vascular calcifications. IMPRESSION: 1. Comminuted and displaced intertrochanteric right  proximal femur fracture. 2. No additional fracture of the right femur. 3. Osteopenia/osteoporosis. Electronically Signed   By: Narda Rutherford M.D.   On: 06/09/2023 19:57   DG Pelvis 1-2 Views  Result Date: 06/09/2023 CLINICAL DATA:  Pain after fall. EXAM: RIGHT FEMUR 2 VIEWS; PELVIS - 1-2 VIEW COMPARISON:  None Available. FINDINGS: Pelvis: Comminuted and displaced intertrochanteric right proximal femur fracture. No hip dislocation. Mild underlying right hip osteoarthritis. Left proximal femur hardware is partially included in the field of view. The bones are diffusely under mineralized. No pubic symphyseal or sacroiliac diastasis. No pubic rami fractures. Right femur: No additional fracture of the femur. The distal femur is intact. Knee alignment is grossly maintained on provided views, although suboptimally assessed. The bones are subjectively under mineralized. Vascular calcifications. IMPRESSION: 1. Comminuted and displaced intertrochanteric right proximal femur fracture. 2. No additional fracture of the right femur. 3. Osteopenia/osteoporosis. Electronically Signed   By: Narda Rutherford M.D.   On: 06/09/2023 19:57    Review of Systems  Unable to perform ROS: Patient nonverbal   Blood pressure (!) 133/99, pulse 100, temperature 98 F (36.7 C), temperature source Oral, resp. rate 20, SpO2 100%. Physical Exam Constitutional:      General: He is not in acute distress.    Appearance: He is well-developed. He is not diaphoretic.  HENT:     Head: Normocephalic and atraumatic.  Eyes:     General: No scleral icterus.       Right eye: No discharge.        Left eye: No discharge.     Conjunctiva/sclera: Conjunctivae normal.  Cardiovascular:     Rate and Rhythm: Normal rate and regular rhythm.  Pulmonary:     Effort: Pulmonary effort is normal. No respiratory distress.  Musculoskeletal:     Cervical back: Normal range of motion.     Comments: RLE No traumatic wounds, ecchymosis, or  rash  Nontender  No knee or ankle effusion  Knee stable to varus/ valgus and anterior/posterior stress  Sens DPN, SPN, TN could not assess  Motor EHL, ext, flex, evers could not assess  DP 1+, PT 0, No significant edema  Skin:    General: Skin is warm and dry.  Neurological:     Mental Status: He is alert.  Psychiatric:        Mood and Affect: Mood normal.        Behavior: Behavior normal.     Assessment/Plan: Right hip fx -- Plan IMN today with Dr. Jena Gauss. Please keep NPO.    Freeman Caldron, PA-C Orthopedic Surgery [phone] 06/10/2023, 9:35 AM

## 2023-06-10 NOTE — Plan of Care (Signed)
  Problem: Elimination: Goal: Will not experience complications related to bowel motility Outcome: Progressing   Problem: Pain Management: Goal: General experience of comfort will improve Outcome: Progressing   Problem: Safety: Goal: Ability to remain free from injury will improve Outcome: Progressing

## 2023-06-10 NOTE — Anesthesia Procedure Notes (Signed)
Procedure Name: Intubation Date/Time: 06/10/2023 3:47 PM  Performed by: Camillia Herter, CRNAPre-anesthesia Checklist: Patient identified, Emergency Drugs available, Suction available and Patient being monitored Patient Re-evaluated:Patient Re-evaluated prior to induction Oxygen Delivery Method: Circle System Utilized Preoxygenation: Pre-oxygenation with 100% oxygen Induction Type: IV induction Ventilation: Mask ventilation without difficulty Laryngoscope Size: Miller and 2 Grade View: Grade II Tube type: Oral Number of attempts: 1 Airway Equipment and Method: Stylet and Oral airway Placement Confirmation: ETT inserted through vocal cords under direct vision, positive ETCO2 and breath sounds checked- equal and bilateral Tube secured with: Tape Dental Injury: Teeth and Oropharynx as per pre-operative assessment

## 2023-06-10 NOTE — Progress Notes (Signed)
Casey Rangel  ZOX:[id number] DOB: 08/02/1951 DOA: 06/09/2023 PCP: Mort Sawyers, FNP    Brief Narrative:  72 year old with a history of left MCA stroke with residual aphasia status post IR mechanical thrombectomy and stenting 04/16/2023, dementia, HTN, HLD, and dysphagia with PEG tube dependence who presented to the ER 10/22 after suffering a mechanical fall from his wheelchair.  X-rays on arrival demonstrated a comminuted displaced intertrochanteric right femur fracture.  CT head at presentation revealed subacute to chronic bilateral subdural collections without mass effect.  Neurosurgery reviewed the films and recommended DAPT be held for 3 weeks with subsequent follow-up imaging.  Goals of Care:   Code Status: Limited: Do not attempt resuscitation (DNR) -DNR-LIMITED -Do Not Intubate/DNI    DVT prophylaxis: SCDs Start: 06/09/23 2242   Interim Hx: Afebrile.  Vital signs stable.  Appears comfortable at the time of my visit.  Nods his head to indicate no when asked if he is in any pain.  No respiratory distress.  No family present at time of exam.  Assessment & Plan:  Acute right femur fracture status post mechanical fall Patient to be taken to the OR 10/23 -care per Orthopedic Surgery  Subacute/chronic bilateral subdural hematoma appreciated on CT head during this presentation with radiologist reporting they were also present on MRI 04/16/2023 (though not mentioned in the formal dictation at that time) - patient was previously on aspirin and Brilinta status post intracranial stenting - case discussed with Neurosurgery by the ER team and the recommendation was to hold DAPT for 3 weeks with subsequent follow-up imaging  Left MCA stroke status post mechanical thrombectomy and stenting 04/16/2023 Will ask IR to comment on plan to hold DAPT once patient is stable postoperatively  Aphasia A chronic consequence of his above-noted CVA  Vascular dementia  Chronic dysphagia with PEG tube  dependence Reportedly the patient no longer attempts to chew when oral intake is attempted  HTN Blood pressure reasonably controlled at present  Family Communication: No family present at time of exam Disposition: Anticipate return to SNF when medically stable   Objective: Blood pressure (!) 133/99, pulse 100, temperature 98 F (36.7 C), temperature source Oral, resp. rate 20, SpO2 100%. No intake or output data in the 24 hours ending 06/10/23 0913 There were no vitals filed for this visit.  Examination: General: No acute respiratory distress Lungs: Clear to auscultation bilaterally without wheezes or crackles Cardiovascular: Regular rate and rhythm without murmur gallop or rub normal S1 and S2 Abdomen: Nontender, nondistended, soft, bowel sounds positive, no rebound, no ascites, no appreciable mass Extremities: No significant cyanosis, clubbing, or edema bilateral lower extremities  CBC: Recent Labs  Lab 06/09/23 2159  WBC 13.9*  NEUTROABS 10.9*  HGB 13.5  HCT 39.2  MCV 92.2  PLT 267   Basic Metabolic Panel: Recent Labs  Lab 06/09/23 2159  NA 138  K 3.9  CL 107  CO2 21*  GLUCOSE 105*  BUN 25*  CREATININE 0.80  CALCIUM 8.5*   GFR: Estimated Creatinine Clearance: 79.6 mL/min (by C-G formula based on SCr of 0.8 mg/dL).   Scheduled Meds: Continuous Infusions:  dextrose 5% lactated ringers 50 mL/hr at 06/09/23 2344     LOS: 1 day   Lonia Blood, MD [facility] Office  [phone] Pager - Text Page per Loretha Stapler  If 7PM-7AM, please contact night-coverage per Amion 06/10/2023, 9:13 AM

## 2023-06-10 NOTE — Plan of Care (Signed)
  Problem: Respiratory: Goal: Ability to maintain a clear airway and adequate ventilation will improve Outcome: Progressing   Problem: Clinical Measurements: Goal: Will remain free from infection Outcome: Progressing

## 2023-06-10 NOTE — ED Notes (Addendum)
ED TO INPATIENT HANDOFF REPORT  ED Nurse Name and Phone #: Pearletha Forge RN # [phone]  S Name/Age/Gender Casey Rangel 72 y.o. male Room/Bed: 039C/039C  Code Status   Code Status: Limited: Do not attempt resuscitation (DNR) -DNR-LIMITED -Do Not Intubate/DNI   Home/SNF/Other Skilled nursing facility Patient is non verbal Is this baseline? yes  Triage Complete: Triage complete  Chief Complaint Hip fracture [facility]) [S72.009A]  Triage Note Per ems, pt coming from [facility]  facility for a fall that occurred Sunday night. Uses at [facility]/wheelchair device at baseline. Had xrays Monday that showed positive right femur fracture. Not on blood thinners. Pt nonverbal at baseline with hx of stroke.   Allergies Allergies  Allergen Reactions   Penicillins Other (See Comments)    Unknown     Level of Care/Admitting Diagnosis ED Disposition     ED Disposition  Admit   Condition  --   Comment  Hospital Area: [facility] [[id number]]  Level of Care: Telemetry Medical [104]  May admit patient to [facility] or [facility] if equivalent level of care is available:: No  Covid Evaluation: Asymptomatic - no recent exposure (last 10 days) testing not required  Diagnosis: Hip fracture [facility]) [[id number]]  Admitting Physician: Anselm Jungling [[id number]]  Attending Physician: Anselm Jungling [[id number]]  Certification:: I certify this patient will need inpatient services for at least 2 midnights  Expected Medical Readiness: 06/12/2023          B Medical/Surgery History Past Medical History:  Diagnosis Date   GERD (gastroesophageal reflux disease)    Hiatal hernia    Stroke [facility])    Past Surgical History:  Procedure Laterality Date   ESOPHAGOGASTRODUODENOSCOPY (EGD) WITH PROPOFOL N/A 04/30/2023   Procedure: ESOPHAGOGASTRODUODENOSCOPY (EGD) WITH PROPOFOL;  Surgeon: Violeta Gelinas, MD;  Location: [facility] ENDOSCOPY;  Service: General;  Laterality: N/A;   INTRAMEDULLARY (IM) NAIL  INTERTROCHANTERIC Left 10/17/2020   Procedure: INTRAMEDULLARY (IM) NAIL INTERTROCHANTRIC;  Surgeon: Kennedy Bucker, MD;  Location: [facility] ORS;  Service: Orthopedics;  Laterality: Left;   IR CT HEAD LTD  01/15/2022   IR CT HEAD LTD  04/16/2023   IR CT HEAD LTD  04/16/2023   IR INTRA CRAN STENT  04/16/2023   IR PERCUTANEOUS ART THROMBECTOMY/INFUSION INTRACRANIAL INC DIAG ANGIO  01/15/2022   IR PERCUTANEOUS ART THROMBECTOMY/INFUSION INTRACRANIAL INC DIAG ANGIO  04/16/2023   IR PTA INTRACRANIAL  01/15/2022   IR US GUIDE VASC ACCESS RIGHT  01/15/2022   PEG PLACEMENT N/A 04/30/2023   Procedure: PERCUTANEOUS ENDOSCOPIC GASTROSTOMY (PEG) PLACEMENT;  Surgeon: Violeta Gelinas, MD;  Location: [facility] ENDOSCOPY;  Service: General;  Laterality: N/A;   RADIOLOGY WITH ANESTHESIA N/A 01/15/2022   Procedure: RADIOLOGY WITH ANESTHESIA;  Surgeon: Julieanne Cotton, MD;  Location: [facility] OR;  Service: Radiology;  Laterality: N/A;   RADIOLOGY WITH ANESTHESIA N/A 04/16/2023   Procedure: IR WITH ANESTHESIA;  Surgeon: Radiologist, Medication, MD;  Location: [facility] OR;  Service: Radiology;  Laterality: N/A;   T9-T11 fusion in 1990       A IV Location/Drains/Wounds Patient Lines/Drains/Airways Status     Active Line/Drains/Airways     Name Placement date Placement time Site Days   Peripheral IV 06/09/23 22 G Anterior;Left Forearm 06/09/23  1629  Forearm  1   Gastrostomy/Enterostomy Percutaneous endoscopic gastrostomy (PEG) 24 Fr. 04/30/23  0928  --  41   External Urinary Catheter 06/09/23  2333  --  1   Wound / Incision (Open or Dehisced) 01/15/22 Puncture Groin Anterior;Proximal;Right  Arterial access puncture site 01/15/22  2230  Groin  511            Intake/Output Last 24 hours No intake or output data in the 24 hours ending 06/10/23 0241  Labs/Imaging Results for orders placed or performed during the hospital encounter of 06/09/23 (from the past 48 hour(s))  CBC with Differential     Status: Abnormal   Collection Time:  06/09/23  9:59 PM  Result Value Ref Range   WBC 13.9 (H) 4.0 - 10.5 K/uL   RBC 4.25 4.22 - 5.81 MIL/uL   Hemoglobin 13.5 13.0 - 17.0 g/dL   HCT 16.1 09.6 - 04.5 %   MCV 92.2 80.0 - 100.0 fL   MCH 31.8 26.0 - 34.0 pg   MCHC 34.4 30.0 - 36.0 g/dL   RDW 40.9 81.1 - 91.4 %   Platelets 267 150 - 400 K/uL   nRBC 0.0 0.0 - 0.2 %   Neutrophils Relative % 78 %   Neutro Abs 10.9 (H) 1.7 - 7.7 K/uL   Lymphocytes Relative 10 %   Lymphs Abs 1.4 0.7 - 4.0 K/uL   Monocytes Relative 7 %   Monocytes Absolute 0.9 0.1 - 1.0 K/uL   Eosinophils Relative 4 %   Eosinophils Absolute 0.6 (H) 0.0 - 0.5 K/uL   Basophils Relative 1 %   Basophils Absolute 0.1 0.0 - 0.1 K/uL   Immature Granulocytes 0 %   Abs Immature Granulocytes 0.05 0.00 - 0.07 K/uL    Comment: Performed at [facility], [address], [place], [place] [address]  Comprehensive metabolic panel     Status: Abnormal   Collection Time: 06/09/23  9:59 PM  Result Value Ref Range   Sodium 138 135 - 145 mmol/L   Potassium 3.9 3.5 - 5.1 mmol/L   Chloride 107 98 - 111 mmol/L   CO2 21 (L) 22 - 32 mmol/L   Glucose, Bld 105 (H) 70 - 99 mg/dL    Comment: Glucose reference range applies only to samples taken after fasting for at least 8 hours.   BUN 25 (H) 8 - 23 mg/dL   Creatinine, Ser 6.21 0.61 - 1.24 mg/dL   Calcium 8.5 (L) 8.9 - 10.3 mg/dL   Total Protein 6.1 (L) 6.5 - 8.1 g/dL   Albumin 2.9 (L) 3.5 - 5.0 g/dL   AST 21 15 - 41 U/L   ALT 29 0 - 44 U/L   Alkaline Phosphatase 69 38 - 126 U/L   Total Bilirubin 1.3 (H) 0.3 - 1.2 mg/dL   GFR, Estimated >30 >86 mL/min    Comment: (NOTE) Calculated using the CKD-EPI Creatinine Equation (2021)    Anion gap 10 5 - 15    Comment: Performed at [facility], [address], [place], [place] [address]   DG Chest 1 View  Result Date: 06/10/2023 CLINICAL DATA:  Recent fall with known right femur fracture EXAM: PORTABLE CHEST 1 VIEW COMPARISON:  04/19/2023 FINDINGS: Cardiac shadow is  stable. Tortuous thoracic aorta is noted. Lungs are clear bilaterally. No bony abnormality is noted. IMPRESSION: No active disease. Electronically Signed   By: Alcide Clever M.D.   On: 06/10/2023 00:05   CT Head Wo Contrast  Result Date: 06/09/2023 CLINICAL DATA:  Head trauma, minor (Age >= 65y); Neck trauma (Age >= 65y) EXAM: CT HEAD WITHOUT CONTRAST CT CERVICAL SPINE WITHOUT CONTRAST TECHNIQUE: Multidetector CT imaging of the head and cervical spine was performed following the standard protocol  without intravenous contrast. Multiplanar CT image reconstructions of the cervical spine were also generated. RADIATION DOSE REDUCTION: This exam was performed according to the departmental dose-optimization program which includes automated exposure control, adjustment of the mA and/or kV according to patient size and/or use of iterative reconstruction technique. COMPARISON:  Head CT 04/16/2023, Brain MR 04/16/23 FINDINGS: CT HEAD FINDINGS Brain: There are bilateral subdural hematomas measuring up to 5 mm along the right cerebral convexity and 4 mm along the left cerebral convexity. These were present prior brain MRI dated 04/16/2023 There are chronic infarcts in the right thalamus in the left basal ganglia. Chronic right occipital lobe, right frontal lobe, and left parietal lobe infarct. No hydrocephalus. Mass effect. No mass lesion. Vascular: No hyperdense vessel or unexpected calcification. Redemonstrated vascular stent in the left M2 segment Skull: Normal. Negative for fracture or focal lesion. Sinuses/Orbits: Middle ear effusion. Trace right mastoid effusion. Mild mucosal left maxillary posterior right ethmoid air cells. Orbits are unremarkable. Other: None. CT CERVICAL SPINE FINDINGS Alignment: Normal. Skull base and vertebrae: No acute fracture. No primary bone lesion or focal pathologic process. Soft tissues and spinal canal: No prevertebral fluid or swelling. No visible canal hematoma. Disc levels:  No CT  evidence of high-grade spinal canal stenosis. Upper chest: 3 mm solid right upper lobe pulmonary nodule, likely infectious or inflammatory. Other: None IMPRESSION: 1. Bilateral subdural collections measuring up to 5 mm along the right cerebral convexity and 4 mm along the left cerebral convexity. No mass effect. 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 2. No acute fracture or traumatic subluxation of the cervical spine. Electronically Signed   By: Lorenza Cambridge M.D.   On: 06/09/2023 21:29   CT Cervical Spine Wo Contrast  Result Date: 06/09/2023 CLINICAL DATA:  Head trauma, minor (Age >= 65y); Neck trauma (Age >= 65y) EXAM: CT HEAD WITHOUT CONTRAST CT CERVICAL SPINE WITHOUT CONTRAST TECHNIQUE: Multidetector CT imaging of the head and cervical spine was performed following the standard protocol without intravenous contrast. Multiplanar CT image reconstructions of the cervical spine were also generated. RADIATION DOSE REDUCTION: This exam was performed according to the departmental dose-optimization program which includes automated exposure control, adjustment of the mA and/or kV according to patient size and/or use of iterative reconstruction technique. COMPARISON:  Head CT 04/16/2023, Brain MR 04/16/23 FINDINGS: CT HEAD FINDINGS Brain: There are bilateral subdural hematomas measuring up to 5 mm along the right cerebral convexity and 4 mm along the left cerebral convexity. These were present prior brain MRI dated 04/16/2023 There are chronic infarcts in the right thalamus in the left basal ganglia. Chronic right occipital lobe, right frontal lobe, and left parietal lobe infarct. No hydrocephalus. Mass effect. No mass lesion. Vascular: No hyperdense vessel or unexpected calcification. Redemonstrated vascular stent in the left M2 segment Skull: Normal. Negative for fracture or focal lesion. Sinuses/Orbits: Middle ear effusion. Trace right mastoid effusion. Mild  mucosal left maxillary posterior right ethmoid air cells. Orbits are unremarkable. Other: None. CT CERVICAL SPINE FINDINGS Alignment: Normal. Skull base and vertebrae: No acute fracture. No primary bone lesion or focal pathologic process. Soft tissues and spinal canal: No prevertebral fluid or swelling. No visible canal hematoma. Disc levels:  No CT evidence of high-grade spinal canal stenosis. Upper chest: 3 mm solid right upper lobe pulmonary nodule, likely infectious or inflammatory. Other: None IMPRESSION: 1. Bilateral subdural collections measuring up to 5 mm along the right cerebral convexity and 4 mm along the  left cerebral convexity. No mass effect. 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 2. No acute fracture or traumatic subluxation of the cervical spine. Electronically Signed   By: Lorenza Cambridge M.D.   On: 06/09/2023 21:29   DG Femur Min 2 Views Right  Result Date: 06/09/2023 CLINICAL DATA:  Pain after fall. EXAM: RIGHT FEMUR 2 VIEWS; PELVIS - 1-2 VIEW COMPARISON:  None Available. FINDINGS: Pelvis: Comminuted and displaced intertrochanteric right proximal femur fracture. No hip dislocation. Mild underlying right hip osteoarthritis. Left proximal femur hardware is partially included in the field of view. The bones are diffusely under mineralized. No pubic symphyseal or sacroiliac diastasis. No pubic rami fractures. Right femur: No additional fracture of the femur. The distal femur is intact. Knee alignment is grossly maintained on provided views, although suboptimally assessed. The bones are subjectively under mineralized. Vascular calcifications. IMPRESSION: 1. Comminuted and displaced intertrochanteric right proximal femur fracture. 2. No additional fracture of the right femur. 3. Osteopenia/osteoporosis. Electronically Signed   By: Narda Rutherford M.D.   On: 06/09/2023 19:57   DG Pelvis 1-2 Views  Result Date: 06/09/2023 CLINICAL DATA:   Pain after fall. EXAM: RIGHT FEMUR 2 VIEWS; PELVIS - 1-2 VIEW COMPARISON:  None Available. FINDINGS: Pelvis: Comminuted and displaced intertrochanteric right proximal femur fracture. No hip dislocation. Mild underlying right hip osteoarthritis. Left proximal femur hardware is partially included in the field of view. The bones are diffusely under mineralized. No pubic symphyseal or sacroiliac diastasis. No pubic rami fractures. Right femur: No additional fracture of the femur. The distal femur is intact. Knee alignment is grossly maintained on provided views, although suboptimally assessed. The bones are subjectively under mineralized. Vascular calcifications. IMPRESSION: 1. Comminuted and displaced intertrochanteric right proximal femur fracture. 2. No additional fracture of the right femur. 3. Osteopenia/osteoporosis. Electronically Signed   By: Narda Rutherford M.D.   On: 06/09/2023 19:57    Pending Labs Unresulted Labs (From admission, onward)     Start     Ordered   06/09/23 2221  Urinalysis, Routine w reflex microscopic -Urine, Catheterized  Once,   URGENT       Question:  Specimen Source  Answer:  Urine, Catheterized   06/09/23 2220            Vitals/Pain Today's Vitals   06/10/23 0000 06/10/23 0030 06/10/23 0100 06/10/23 0114  BP: (!) 138/97 (!) 142/78 (!) 146/93   Pulse: 70 86 96   Resp: (!) 22 15 (!) 32   Temp:      TempSrc:      SpO2: 100% 100% 100%   PainSc:    0-No pain    Isolation Precautions No active isolations  Medications Medications  HYDROcodone-acetaminophen (NORCO/VICODIN) 5-325 MG per tablet 1-2 tablet (has no administration in time range)  morphine (PF) 2 MG/ML injection 2 mg (2 mg Intravenous Given 06/09/23 2341)  dextrose 5 % in lactated ringers infusion ( Intravenous New Bag/Given 06/09/23 2344)    Mobility walks     Focused Assessments Patient fell at nursing home, has a right hip fracture.    R Recommendations: See Admitting Provider  Note  Report given to:   Additional Notes:   Patient fell Sunday, xrays completed on Monday and found to have a right femur fracture.

## 2023-06-10 NOTE — Progress Notes (Addendum)
Initial Nutrition Assessment  DOCUMENTATION CODES:   Non-severe (moderate) malnutrition in context of chronic illness  INTERVENTION:  Initiate tube feeding via PEG: Osmolite 1.5  at 50 ml/h ( 1200 ml per day) Prosource TF20 60 ml once daily Recommend start feeding rate at 20 mL/hr, increase 20 mL Q4 hr until goal rate. Recommend additional water flushes of 150 mL Q4 hr.  Provides 1880 kcal, 95 gm protein, 914 ml free water daily, total water 1814 mL.  2. Monitor magnesium, potassium, and phosphorus BID for at least 3 days, MD to replete as needed, as pt is at risk for refeeding syndrome given severe weight loss and patient meets criteria for moderate malnutrition.   3. Continue NPO.    NUTRITION DIAGNOSIS:   Moderate Malnutrition related to chronic illness, other (see comment) (hx of dysphagia) as evidenced by moderate muscle depletion, moderate fat depletion, percent weight loss.    GOAL:   Patient will meet greater than or equal to 90% of their needs    MONITOR:   PO intake, Skin, Labs, Weight trends, TF tolerance  REASON FOR ASSESSMENT:   Consult Assessment of nutrition requirement/status, Enteral/tube feeding initiation and management  ASSESSMENT:    72 y/o male presented following a fall, fell out of wheel chair at the facility he resides.  X-ray revealed comminuted and displaced intertrochanteric right proximal femur fracture.  CT head showed subacute to chronic bilateral subdural collections. Patient has aphasia, non-verbal at baseline.   PMH: GERD, stroke, thrombectomy intracranial with stenting, PEG placement 04/30/23, smoking hx, vascular dementia, HTN.  10/23, plan for IMN today for right femoral fracture.  Per review of EMR weight hx shows a loss of 11% from 04/16/23 of 76.4 kg. Met with patient at bedside, he is unable to provide nutrition hx. TC to [facility], RN informed patient remains NPO, receives all of his nutrition via the PEG.  Usual TF regimen Osmolite 1.5 at 55 mL/hr x 22 hrs with free water flushes of 150 mL Q hr.  Patient was noted to be agitated earlier, RN assisted while NFPE performed.  Medications reviewed.  Labs reviewed.  NUTRITION - FOCUSED PHYSICAL EXAM:  Flowsheet Row Most Recent Value  Orbital Region No depletion  Upper Arm Region No depletion  Thoracic and Lumbar Region No depletion  Buccal Region Moderate depletion  Temple Region Moderate depletion  Clavicle Bone Region Moderate depletion  Clavicle and Acromion Bone Region Mild depletion  Patellar Region Unable to assess  [patient appeared anxious and agitated when evaluating lower extremeties and thought to be in pain per RN]  Anterior Thigh Region Unable to assess  Posterior Calf Region Unable to assess  Edema (RD Assessment) None  Hair Reviewed  Eyes Reviewed  Skin Reviewed       Diet Order:   Diet Order             Diet NPO time specified Except for: Sips with Meds  Diet effective now                   EDUCATION NEEDS:   Not appropriate for education at this time  Skin:  Skin Assessment: Skin Integrity Issues: Skin Integrity Issues:: Other (Comment) Other: erythema/redness to right knee  Last BM:  prior to admission  Height:   Ht Readings from Last 1 Encounters:  06/03/23 5\' 8"  (1.727 m)    Weight:   Wt Readings from Last 1 Encounters:  06/03/23 67.4 kg    Ideal Body  Weight:  70 kg  BMI:  There is no height or weight on file to calculate BMI.  Estimated Nutritional Needs:   Kcal:  1750-2100  Protein:  84-105  Fluid:  1750-2100    Alvino Chapel, RDLD Clinical Dietitian See AMION for contact information

## 2023-06-10 NOTE — ED Notes (Signed)
Courtesy call to floor to notify of patient being transport to admission room. Spoke to [place], provided my contact # if receiving RN had questions or concerns.

## 2023-06-10 NOTE — TOC CAGE-AID Note (Signed)
Transition of Care [facility]) - CAGE-AID Screening   Patient Details  Name: Casey Rangel MRN: [id number] Date of Birth: [birth year]  [facility] of [facility] CM/SW Contact:    Janora Norlander, RN Phone Number: [phone] 06/10/2023, 7:37 PM   Clinical Narrative: Pt here after sustaining a femoral neck fracture due to a fall. Pt nonverbal and disoriented at baseline.  Screening unable to be complete.    CAGE-AID Screening: Substance Abuse Screening unable to be completed due to: : Patient unable to participate

## 2023-06-10 NOTE — Op Note (Signed)
Orthopaedic Surgery Operative Note (CSN: [id number] ) Date of Surgery: 06/10/2023  Admit Date: 06/09/2023   Diagnoses: Pre-Op Diagnoses: Right intertrochanteric femur fracture  Post-Op Diagnosis: Same  Procedures: CPT 27245-Cephalomedullary nailing of right intertrochanteric femur fracture  Surgeons : Primary: Roby Lofts, MD  Assistant: Darron Doom, RNFA  Location: OR 3   Anesthesia: General   Antibiotics: Ancef 2g preop   Tourniquet time: None    Estimated Blood Loss: 150 mL  Complications:None  Specimens:* No specimens in log *   Implants: Implant Name Type Inv. Item Serial No. Manufacturer Lot No. LRB No. Used Action  SCREW LAG COMPR KIT 105/100 - [id number] Screw SCREW LAG COMPR KIT 105/100  [facility] [employer] [id number] Right 1 Implanted  NAIL INTERTAN 10X18 130D 10S - [id number] Nail NAIL INTERTAN 10X18 130D 10S  [employer] [id number] Right 1 Implanted  SCREW TRIGEN LOW PROF 5.0X37.5 - [id number] Screw SCREW TRIGEN LOW PROF 5.0X37.5  [employer] [id number] Right 1 Implanted     Indications for Surgery: 72 year old male who sustained a ground-level fall with a right intertrochanteric femur fracture.  Due to the unstable nature of his injury I recommend proceeding with cephalomedullary nailing of his right hip.  Risks and benefits were discussed with the patient's sister as the patient has dementia.  Risks included but not limited to bleeding, infection, malunion, nonunion, hardware failure, hardware irritation, nerve and blood vessel injury, DVT, even the possibility anesthetic complications.  She agreed to proceed with surgery and consent was obtained.  Operative Findings: Cephalomedullary nailing of right intertrochanteric femur fracture using [employer] Inter[employer]AN 10 x 180 mm nail with a 105 mm lag screw and 100 mm compression screw.  Procedure: The patient was identified in the preoperative holding area. Consent  was confirmed with the patient and their family and all questions were answered. The operative extremity was marked after confirmation with the patient. he was then brought back to the operating room by our anesthesia colleagues.  He was placed under general anesthetic and carefully transferred over to a Hana table.  All bony prominences were well-padded.  Traction was applied to the right lower extremity and alignment was obtained.  The right lower extremity was then prepped and draped in usual sterile fashion.  A timeout was performed to verify the patient, the procedure, and the extremity.  Preoperative antibiotics were dosed.  A small incision proximal to the greater trochanter was made and carried down through skin and subcutaneous tissue.  Threaded guidewire was placed at the tip of the greater trochanter and advanced into the proximal metaphysis.  An entry reamer was then used to enter the medullary canal.  A 10 x 180 mm nail attached to a targeting arm was then placed on the side of the canal.  I then directed a threaded guidewire into the head/neck segment.  I confirmed tip apex distance using fluoroscopic imaging.  I then measured the length and chose to use 105 mm lag screw.  I drilled the path for the compression screw and placed an antirotation bar.  I then drilled the path for the lag screw and placed the lag screw.  I then compressed using the compression screw approximately 7 to 10 mm.  I statically locked the proximal portion of the nail.  I then used the targeting arm to place a distal interlocking screw.  Final fluoroscopic imaging was then obtained after the targeting arm was removed.  The incisions were irrigated and closed  with 2-0 Monocryl and Dermabond.  Sterile dressings were applied.  The patient was then awoke from anesthesia and taken to the PACU in stable condition.  Post Op Plan/Instructions: The patient will be weightbearing as tolerated to the right lower extremity.  He will  receive postoperative Ancef.  He will be placed on antiplatelet therapy for DVT prophylaxis.  Will have him mobilize with physical and Occupational Therapy.  I was present and performed the entire surgery.  Truitt Merle, MD [facility]

## 2023-06-10 NOTE — H&P (View-Only) (Signed)
Reason for Consult:Right hip fx Referring Physician: Jetty Duhamel Time called: 0730 Time at bedside: 0914   Casey Rangel is an 72 y.o. male.  HPI: Shloma fell out of his WC at the SNF where he resides. He was brought to the ED where x-rays showed a right hip fx and orthopedic surgery was consulted. Due to scheduling issues orthopedic trauma consultation was requested. He is nonverbal and cannot contribute to history.  Past Medical History:  Diagnosis Date   GERD (gastroesophageal reflux disease)    Hiatal hernia    Stroke [facility])     Past Surgical History:  Procedure Laterality Date   ESOPHAGOGASTRODUODENOSCOPY (EGD) WITH PROPOFOL N/A 04/30/2023   Procedure: ESOPHAGOGASTRODUODENOSCOPY (EGD) WITH PROPOFOL;  Surgeon: Violeta Gelinas, MD;  Location: [facility] ENDOSCOPY;  Service: General;  Laterality: N/A;   INTRAMEDULLARY (IM) NAIL INTERTROCHANTERIC Left 10/17/2020   Procedure: INTRAMEDULLARY (IM) NAIL INTERTROCHANTRIC;  Surgeon: Kennedy Bucker, MD;  Location: [facility] ORS;  Service: Orthopedics;  Laterality: Left;   IR CT HEAD LTD  01/15/2022   IR CT HEAD LTD  04/16/2023   IR CT HEAD LTD  04/16/2023   IR INTRA CRAN STENT  04/16/2023   IR PERCUTANEOUS ART THROMBECTOMY/INFUSION INTRACRANIAL INC DIAG ANGIO  01/15/2022   IR PERCUTANEOUS ART THROMBECTOMY/INFUSION INTRACRANIAL INC DIAG ANGIO  04/16/2023   IR PTA INTRACRANIAL  01/15/2022   IR US GUIDE VASC ACCESS RIGHT  01/15/2022   PEG PLACEMENT N/A 04/30/2023   Procedure: PERCUTANEOUS ENDOSCOPIC GASTROSTOMY (PEG) PLACEMENT;  Surgeon: Violeta Gelinas, MD;  Location: [facility] ENDOSCOPY;  Service: General;  Laterality: N/A;   RADIOLOGY WITH ANESTHESIA N/A 01/15/2022   Procedure: RADIOLOGY WITH ANESTHESIA;  Surgeon: Julieanne Cotton, MD;  Location: [facility] OR;  Service: Radiology;  Laterality: N/A;   RADIOLOGY WITH ANESTHESIA N/A 04/16/2023   Procedure: IR WITH ANESTHESIA;  Surgeon: Radiologist, Medication, MD;  Location: [facility] OR;  Service: Radiology;  Laterality: N/A;    T9-T11 fusion in 1990      Family History  Problem Relation Age of Onset   Hearing loss Mother    Liver disease Brother    Hearing loss Paternal Grandmother     Social History:  reports that he quit smoking about 23 years ago. His smoking use included cigarettes. He started smoking about 53 years ago. He has a 45 pack-year smoking history. He has never used smokeless tobacco. He reports that he does not currently use alcohol. He reports that he does not use drugs.  Allergies:  Allergies  Allergen Reactions   Penicillins Other (See Comments)    Unknown     Medications: I have reviewed the patient's current medications.  Results for orders placed or performed during the hospital encounter of 06/09/23 (from the past 48 hour(s))  CBC with Differential     Status: Abnormal   Collection Time: 06/09/23  9:59 PM  Result Value Ref Range   WBC 13.9 (H) 4.0 - 10.5 K/uL   RBC 4.25 4.22 - 5.81 MIL/uL   Hemoglobin 13.5 13.0 - 17.0 g/dL   HCT 16.1 09.6 - 04.5 %   MCV 92.2 80.0 - 100.0 fL   MCH 31.8 26.0 - 34.0 pg   MCHC 34.4 30.0 - 36.0 g/dL   RDW 40.9 81.1 - 91.4 %   Platelets 267 150 - 400 K/uL   nRBC 0.0 0.0 - 0.2 %   Neutrophils Relative % 78 %   Neutro Abs 10.9 (H) 1.7 - 7.7 K/uL   Lymphocytes Relative 10 %   Lymphs Abs  1.4 0.7 - 4.0 K/uL   Monocytes Relative 7 %   Monocytes Absolute 0.9 0.1 - 1.0 K/uL   Eosinophils Relative 4 %   Eosinophils Absolute 0.6 (H) 0.0 - 0.5 K/uL   Basophils Relative 1 %   Basophils Absolute 0.1 0.0 - 0.1 K/uL   Immature Granulocytes 0 %   Abs Immature Granulocytes 0.05 0.00 - 0.07 K/uL    Comment: Performed at [facility], [address], [place], [place] [address]  Comprehensive metabolic panel     Status: Abnormal   Collection Time: 06/09/23  9:59 PM  Result Value Ref Range   Sodium 138 135 - 145 mmol/L   Potassium 3.9 3.5 - 5.1 mmol/L   Chloride 107 98 - 111 mmol/L   CO2 21 (L) 22 - 32 mmol/L   Glucose, Bld 105 (H) 70 - 99 mg/dL     Comment: Glucose reference range applies only to samples taken after fasting for at least 8 hours.   BUN 25 (H) 8 - 23 mg/dL   Creatinine, Ser 6.04 0.61 - 1.24 mg/dL   Calcium 8.5 (L) 8.9 - 10.3 mg/dL   Total Protein 6.1 (L) 6.5 - 8.1 g/dL   Albumin 2.9 (L) 3.5 - 5.0 g/dL   AST 21 15 - 41 U/L   ALT 29 0 - 44 U/L   Alkaline Phosphatase 69 38 - 126 U/L   Total Bilirubin 1.3 (H) 0.3 - 1.2 mg/dL   GFR, Estimated >54 >09 mL/min    Comment: (NOTE) Calculated using the CKD-EPI Creatinine Equation (2021)    Anion gap 10 5 - 15    Comment: Performed at [facility], [address], [place], [place] [address]  Urinalysis, Routine w reflex microscopic -Urine, Catheterized     Status: Abnormal   Collection Time: 06/10/23  2:22 AM  Result Value Ref Range   Color, Urine AMBER (A) YELLOW    Comment: BIOCHEMICALS MAY BE AFFECTED BY COLOR   APPearance CLEAR CLEAR   Specific Gravity, Urine 1.031 (H) 1.005 - 1.030   pH 5.0 5.0 - 8.0   Glucose, UA NEGATIVE NEGATIVE mg/dL   Hgb urine dipstick NEGATIVE NEGATIVE   Bilirubin Urine NEGATIVE NEGATIVE   Ketones, ur NEGATIVE NEGATIVE mg/dL   Protein, ur 30 (A) NEGATIVE mg/dL   Nitrite NEGATIVE NEGATIVE   Leukocytes,Ua NEGATIVE NEGATIVE   RBC / HPF 0-5 0 - 5 RBC/hpf   WBC, UA 0-5 0 - 5 WBC/hpf   Bacteria, UA NONE SEEN NONE SEEN   Squamous Epithelial / HPF 0-5 0 - 5 /HPF   Mucus PRESENT     Comment: Performed at [facility], [address], [place], [place] [address]  Surgical pcr screen     Status: None   Collection Time: 06/10/23  3:57 AM   Specimen: Nasal Mucosa; Nasal Swab  Result Value Ref Range   MRSA, PCR NEGATIVE NEGATIVE   Staphylococcus aureus NEGATIVE NEGATIVE    Comment: (NOTE) The Xpert SA Assay (FDA approved for NASAL specimens in patients 38 years of age and older), is one component of a comprehensive surveillance program. It is not intended to diagnose infection nor to guide or monitor treatment. Performed at [facility], [address], [place], [place] [address]     DG Chest 1 View  Result Date: 06/10/2023 CLINICAL DATA:  Recent fall with known right femur fracture EXAM: PORTABLE CHEST 1 VIEW COMPARISON:  04/19/2023 FINDINGS: Cardiac shadow is  stable. Tortuous thoracic aorta is noted. Lungs are clear bilaterally. No bony abnormality is noted. IMPRESSION: No active disease. Electronically Signed   By: Alcide Clever M.D.   On: 06/10/2023 00:05   CT Head Wo Contrast  Result Date: 06/09/2023 CLINICAL DATA:  Head trauma, minor (Age >= 65y); Neck trauma (Age >= 65y) EXAM: CT HEAD WITHOUT CONTRAST CT CERVICAL SPINE WITHOUT CONTRAST TECHNIQUE: Multidetector CT imaging of the head and cervical spine was performed following the standard protocol without intravenous contrast. Multiplanar CT image reconstructions of the cervical spine were also generated. RADIATION DOSE REDUCTION: This exam was performed according to the departmental dose-optimization program which includes automated exposure control, adjustment of the mA and/or kV according to patient size and/or use of iterative reconstruction technique. COMPARISON:  Head CT 04/16/2023, Brain MR 04/16/23 FINDINGS: CT HEAD FINDINGS Brain: There are bilateral subdural hematomas measuring up to 5 mm along the right cerebral convexity and 4 mm along the left cerebral convexity. These were present prior brain MRI dated 04/16/2023 There are chronic infarcts in the right thalamus in the left basal ganglia. Chronic right occipital lobe, right frontal lobe, and left parietal lobe infarct. No hydrocephalus. Mass effect. No mass lesion. Vascular: No hyperdense vessel or unexpected calcification. Redemonstrated vascular stent in the left M2 segment Skull: Normal. Negative for fracture or focal lesion. Sinuses/Orbits: Middle ear effusion. Trace right mastoid effusion. Mild mucosal left maxillary posterior right ethmoid air cells. Orbits are unremarkable. Other: None. CT CERVICAL SPINE  FINDINGS Alignment: Normal. Skull base and vertebrae: No acute fracture. No primary bone lesion or focal pathologic process. Soft tissues and spinal canal: No prevertebral fluid or swelling. No visible canal hematoma. Disc levels:  No CT evidence of high-grade spinal canal stenosis. Upper chest: 3 mm solid right upper lobe pulmonary nodule, likely infectious or inflammatory. Other: None IMPRESSION: 1. Bilateral subdural collections measuring up to 5 mm along the right cerebral convexity and 4 mm along the left cerebral convexity. No mass effect. 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 2. No acute fracture or traumatic subluxation of the cervical spine. Electronically Signed   By: Lorenza Cambridge M.D.   On: 06/09/2023 21:29   CT Cervical Spine Wo Contrast  Result Date: 06/09/2023 CLINICAL DATA:  Head trauma, minor (Age >= 65y); Neck trauma (Age >= 65y) EXAM: CT HEAD WITHOUT CONTRAST CT CERVICAL SPINE WITHOUT CONTRAST TECHNIQUE: Multidetector CT imaging of the head and cervical spine was performed following the standard protocol without intravenous contrast. Multiplanar CT image reconstructions of the cervical spine were also generated. RADIATION DOSE REDUCTION: This exam was performed according to the departmental dose-optimization program which includes automated exposure control, adjustment of the mA and/or kV according to patient size and/or use of iterative reconstruction technique. COMPARISON:  Head CT 04/16/2023, Brain MR 04/16/23 FINDINGS: CT HEAD FINDINGS Brain: There are bilateral subdural hematomas measuring up to 5 mm along the right cerebral convexity and 4 mm along the left cerebral convexity. These were present prior brain MRI dated 04/16/2023 There are chronic infarcts in the right thalamus in the left basal ganglia. Chronic right occipital lobe, right frontal lobe, and left parietal lobe infarct. No hydrocephalus. Mass effect. No mass lesion.  Vascular: No hyperdense vessel or unexpected calcification. Redemonstrated vascular stent in the left M2 segment Skull: Normal. Negative for fracture or focal lesion. Sinuses/Orbits: Middle ear effusion. Trace right mastoid effusion. Mild mucosal left maxillary posterior right ethmoid air cells. Orbits are unremarkable. Other:  None. CT CERVICAL SPINE FINDINGS Alignment: Normal. Skull base and vertebrae: No acute fracture. No primary bone lesion or focal pathologic process. Soft tissues and spinal canal: No prevertebral fluid or swelling. No visible canal hematoma. Disc levels:  No CT evidence of high-grade spinal canal stenosis. Upper chest: 3 mm solid right upper lobe pulmonary nodule, likely infectious or inflammatory. Other: None IMPRESSION: 1. Bilateral subdural collections measuring up to 5 mm along the right cerebral convexity and 4 mm along the left cerebral convexity. No mass effect. 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 2. No acute fracture or traumatic subluxation of the cervical spine. Electronically Signed   By: Lorenza Cambridge M.D.   On: 06/09/2023 21:29   DG Femur Min 2 Views Right  Result Date: 06/09/2023 CLINICAL DATA:  Pain after fall. EXAM: RIGHT FEMUR 2 VIEWS; PELVIS - 1-2 VIEW COMPARISON:  None Available. FINDINGS: Pelvis: Comminuted and displaced intertrochanteric right proximal femur fracture. No hip dislocation. Mild underlying right hip osteoarthritis. Left proximal femur hardware is partially included in the field of view. The bones are diffusely under mineralized. No pubic symphyseal or sacroiliac diastasis. No pubic rami fractures. Right femur: No additional fracture of the femur. The distal femur is intact. Knee alignment is grossly maintained on provided views, although suboptimally assessed. The bones are subjectively under mineralized. Vascular calcifications. IMPRESSION: 1. Comminuted and displaced intertrochanteric right  proximal femur fracture. 2. No additional fracture of the right femur. 3. Osteopenia/osteoporosis. Electronically Signed   By: Narda Rutherford M.D.   On: 06/09/2023 19:57   DG Pelvis 1-2 Views  Result Date: 06/09/2023 CLINICAL DATA:  Pain after fall. EXAM: RIGHT FEMUR 2 VIEWS; PELVIS - 1-2 VIEW COMPARISON:  None Available. FINDINGS: Pelvis: Comminuted and displaced intertrochanteric right proximal femur fracture. No hip dislocation. Mild underlying right hip osteoarthritis. Left proximal femur hardware is partially included in the field of view. The bones are diffusely under mineralized. No pubic symphyseal or sacroiliac diastasis. No pubic rami fractures. Right femur: No additional fracture of the femur. The distal femur is intact. Knee alignment is grossly maintained on provided views, although suboptimally assessed. The bones are subjectively under mineralized. Vascular calcifications. IMPRESSION: 1. Comminuted and displaced intertrochanteric right proximal femur fracture. 2. No additional fracture of the right femur. 3. Osteopenia/osteoporosis. Electronically Signed   By: Narda Rutherford M.D.   On: 06/09/2023 19:57    Review of Systems  Unable to perform ROS: Patient nonverbal   Blood pressure (!) 133/99, pulse 100, temperature 98 F (36.7 C), temperature source Oral, resp. rate 20, SpO2 100%. Physical Exam Constitutional:      General: He is not in acute distress.    Appearance: He is well-developed. He is not diaphoretic.  HENT:     Head: Normocephalic and atraumatic.  Eyes:     General: No scleral icterus.       Right eye: No discharge.        Left eye: No discharge.     Conjunctiva/sclera: Conjunctivae normal.  Cardiovascular:     Rate and Rhythm: Normal rate and regular rhythm.  Pulmonary:     Effort: Pulmonary effort is normal. No respiratory distress.  Musculoskeletal:     Cervical back: Normal range of motion.     Comments: RLE No traumatic wounds, ecchymosis, or  rash  Nontender  No knee or ankle effusion  Knee stable to varus/ valgus and anterior/posterior stress  Sens DPN, SPN, TN could not assess  Motor EHL, ext, flex, evers could not assess  DP 1+, PT 0, No significant edema  Skin:    General: Skin is warm and dry.  Neurological:     Mental Status: He is alert.  Psychiatric:        Mood and Affect: Mood normal.        Behavior: Behavior normal.     Assessment/Plan: Right hip fx -- Plan IMN today with Dr. Jena Gauss. Please keep NPO.    Freeman Caldron, PA-C Orthopedic Surgery [phone] 06/10/2023, 9:35 AM

## 2023-06-10 NOTE — Anesthesia Preprocedure Evaluation (Signed)
Anesthesia Evaluation  Patient identified by MRN, date of birth, ID band Patient awake    Reviewed: Allergy & Precautions, H&P , NPO status , Patient's Chart, lab work & pertinent test results  Airway Mallampati: II  TM Distance: >3 FB Neck ROM: Limited    Dental no notable dental hx.    Pulmonary neg pulmonary ROS, former smoker   Pulmonary exam normal breath sounds clear to auscultation       Cardiovascular hypertension, Pt. on medications Normal cardiovascular exam Rhythm:Regular Rate:Normal  Left Ventricle: Left ventricular ejection fraction, by estimation, is 45  to 50%. Left ventricular ejection fraction by 2D MOD biplane is 47.8 %.  The left ventricle has mildly decreased function. The left ventricle  demonstrates global hypokinesis. The  left ventricular internal cavity size was normal in size. There is  moderate asymmetric left ventricular hypertrophy of the basal-septal  segment. Left ventricular diastolic parameters are consistent with Grade I  diastolic dysfunction (impaired  relaxation). Indeterminate filling pressures.   Right Ventricle: The right ventricular size is normal. No increase in  right ventricular wall thickness. Right ventricular systolic function is  low normal. There is normal pulmonary artery systolic pressure. The  tricuspid regurgitant velocity is 1.51 m/s,   and with an assumed right atrial pressure of 3 mmHg, the estimated right  ventricular systolic pressure is 12.1 mmHg.   Left Atrium: Left atrial size was normal in size.   Right Atrium: Right atrial size was normal in size.   Pericardium: There is no evidence of pericardial effusion.   Mitral Valve: The mitral valve is abnormal. Mild mitral annular  calcification. Trivial mitral valve regurgitation.   Tricuspid Valve: The tricuspid valve is grossly normal. Tricuspid valve  regurgitation is trivial.   Aortic Valve: The aortic valve is  grossly normal. Aortic valve  regurgitation is trivial. Aortic regurgitation PHT measures 242 msec.  Aortic valve peak gradient measures 5.4 mmHg.   Pulmonic Valve: The pulmonic valve was normal in structure. Pulmonic valve  regurgitation is not visualized.   Aorta: Aortic dilatation noted. There is moderate dilatation of the aortic  root, measuring 45 mm. There is mild dilatation of the ascending aorta,  measuring 44 mm.     Neuro/Psych       Dementia Non verbal CVA, Residual Symptoms    GI/Hepatic Neg liver ROS,GERD  ,,  Endo/Other  negative endocrine ROS    Renal/GU negative Renal ROS  negative genitourinary   Musculoskeletal negative musculoskeletal ROS (+)    Abdominal   Peds negative pediatric ROS (+)  Hematology negative hematology ROS (+)   Anesthesia Other Findings   Reproductive/Obstetrics negative OB ROS                             Anesthesia Physical Anesthesia Plan  ASA: 4  Anesthesia Plan: General   Post-op Pain Management: Ofirmev IV (intra-op)*   Induction: Intravenous  PONV Risk Score and Plan: 2 and Ondansetron, Dexamethasone and Treatment may vary due to age or medical condition  Airway Management Planned: Oral ETT  Additional Equipment:   Intra-op Plan:   Post-operative Plan: Extubation in OR  Informed Consent: I have reviewed the patients History and Physical, chart, labs and discussed the procedure including the risks, benefits and alternatives for the proposed anesthesia with the patient or authorized representative who has indicated his/her understanding and acceptance.     Dental advisory given  Plan Discussed with: CRNA and  Surgeon  Anesthesia Plan Comments:        Anesthesia Quick Evaluation

## 2023-06-10 NOTE — Interval H&P Note (Signed)
History and Physical Interval Note:  06/10/2023 2:47 PM  Casey Rangel  has presented today for surgery, with the diagnosis of RIGHT INTERTROCHANTERIC FRACTURE.  The various methods of treatment have been discussed with the patient and family. After consideration of risks, benefits and other options for treatment, the patient has consented to  Procedure(s): INTRAMEDULLARY (IM) NAIL INTERTROCHANTERIC (Right) as a surgical intervention.  The patient's history has been reviewed, patient examined, no change in status, stable for surgery.  I have reviewed the patient's chart and labs.  Questions were answered to the patient's satisfaction.     Caryn Bee P Olman Yono

## 2023-06-11 ENCOUNTER — Encounter (HOSPITAL_COMMUNITY): Payer: Self-pay | Admitting: Student

## 2023-06-11 LAB — MAGNESIUM
Magnesium: 2.3 mg/dL (ref 1.7–2.4)
Magnesium: 2.2 mg/dL (ref 1.7–2.4)

## 2023-06-11 LAB — CBC
HCT: 34.5 % — ABNORMAL LOW (ref 39.0–52.0)
Hemoglobin: 11.5 g/dL — ABNORMAL LOW (ref 13.0–17.0)
MCH: 30.6 pg (ref 26.0–34.0)
MCHC: 33.3 g/dL (ref 30.0–36.0)
MCV: 91.8 fL (ref 80.0–100.0)
Platelets: 246 10*3/uL (ref 150–400)
RBC: 3.76 MIL/uL — ABNORMAL LOW (ref 4.22–5.81)
RDW: 15.1 % (ref 11.5–15.5)
WBC: 11.2 10*3/uL — ABNORMAL HIGH (ref 4.0–10.5)
nRBC: 0 % (ref 0.0–0.2)

## 2023-06-11 LAB — GLUCOSE, CAPILLARY
Glucose-Capillary: 102 mg/dL — ABNORMAL HIGH (ref 70–99)
Glucose-Capillary: 132 mg/dL — ABNORMAL HIGH (ref 70–99)

## 2023-06-11 LAB — COMPREHENSIVE METABOLIC PANEL
ALT: 23 U/L (ref 0–44)
AST: 18 U/L (ref 15–41)
Albumin: 2.7 g/dL — ABNORMAL LOW (ref 3.5–5.0)
Alkaline Phosphatase: 54 U/L (ref 38–126)
Anion gap: 9 (ref 5–15)
BUN: 29 mg/dL — ABNORMAL HIGH (ref 8–23)
CO2: 22 mmol/L (ref 22–32)
Calcium: 8.4 mg/dL — ABNORMAL LOW (ref 8.9–10.3)
Chloride: 108 mmol/L (ref 98–111)
Creatinine, Ser: 0.88 mg/dL (ref 0.61–1.24)
GFR, Estimated: >60 mL/min (ref >60)
Glucose, Bld: 116 mg/dL — ABNORMAL HIGH (ref 70–99)
Potassium: 4.2 mmol/L (ref 3.5–5.1)
Sodium: 139 mmol/L (ref 135–145)
Total Bilirubin: 0.8 mg/dL (ref 0.3–1.2)
Total Protein: 5.8 g/dL — ABNORMAL LOW (ref 6.5–8.1)

## 2023-06-11 LAB — PHOSPHORUS: Phosphorus: 3.5 mg/dL (ref 2.5–4.6)

## 2023-06-11 LAB — VITAMIN D 25 HYDROXY (VIT D DEFICIENCY, FRACTURES): Vit D, 25-Hydroxy: 25.06 ng/mL — ABNORMAL LOW (ref 30–100)

## 2023-06-11 MED ORDER — FREE WATER
150.0000 mL | Status: DC
  Administered 2023-06-11 – 2023-06-12 (×7): 150 mL

## 2023-06-11 MED ORDER — OSMOLITE 1.5 CAL PO LIQD
1000.0000 mL | ORAL | Status: DC
  Administered 2023-06-11: 1000 mL
  Filled 2023-06-11: qty 1000

## 2023-06-11 MED ORDER — ASPIRIN 81 MG PO CHEW
81.0000 mg | CHEWABLE_TABLET | Freq: Every day | ORAL | Status: DC
  Administered 2023-06-11 – 2023-06-12 (×2): 81 mg
  Filled 2023-06-11 (×2): qty 1

## 2023-06-11 MED ORDER — PROSOURCE TF20 ENFIT COMPATIBL EN LIQD
60.0000 mL | Freq: Every day | ENTERAL | Status: DC
  Administered 2023-06-11 – 2023-06-12 (×2): 60 mL
  Filled 2023-06-11 (×2): qty 60

## 2023-06-11 NOTE — NC FL2 (Signed)
[place] MEDICAID FL2 LEVEL OF CARE FORM     IDENTIFICATION  Patient Name: Casey Rangel Birthdate: 06/23/1951 Sex: male Admission Date (Current Location): 06/09/2023  [place] and [place] Number:  Haynes Bast [id number] M Facility and Address:  [facility], [address], [place], [place] [address]      Provider Number: [id number]  Attending Physician Name and Address:  Lonia Blood, MD  Relative Name and Phone Number:  Margart Sickles [phone]    Current Level of Care: Hospital Recommended Level of Care: Skilled Nursing Facility Prior Approval Number:    Date Approved/Denied:   PASRR Number: [id number]  Discharge Plan: SNF    Current Diagnoses: Patient Active Problem List   Diagnosis Date Noted   Right femoral fracture (HCC) 06/09/2023   History of stroke 06/09/2023   Hip fracture (HCC) 06/09/2023   Dysphagia 06/09/2023   PEG (percutaneous endoscopic gastrostomy) status (HCC) 06/09/2023   Subdural hematoma (HCC) 06/09/2023   Malnutrition of moderate degree 04/21/2023   Acute respiratory failure with hypoxia (HCC) 04/16/2023   Middle cerebral artery embolism, 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   Vascular dementia (HCC) 03/20/2022   Acute cognitive decline 03/11/2022   Mixed hyperlipidemia 03/11/2022   Constipation 03/11/2022   Acute combined systolic and diastolic heart failure (HCC)    Acute ischemic left MCA stroke (HCC) 01/15/2022   HTN (hypertension) 11/14/2020    Orientation RESPIRATION BLADDER Height & Weight      (not oriented)  Normal External catheter, Incontinent Weight:   Height:     BEHAVIORAL SYMPTOMS/MOOD NEUROLOGICAL BOWEL NUTRITION STATUS      Incontinent Diet (see discharge summary)  AMBULATORY STATUS COMMUNICATION OF NEEDS Skin   Limited Assist Verbally Surgical wounds, Other (Comment) (redness, ecchymosis)                       Personal Care Assistance Level of Assistance  Bathing, Feeding, Dressing Bathing  Assistance: Maximum assistance Feeding assistance: Maximum assistance Dressing Assistance: Maximum assistance     Functional Limitations Info  Sight, Hearing, Speech Sight Info: Adequate Hearing Info: Impaired Speech Info: Impaired    SPECIAL CARE FACTORS FREQUENCY  PT (By licensed PT), OT (By licensed OT)     PT Frequency: 5x week OT Frequency: 5x week            Contractures Contractures Info: Not present    Additional Factors Info  Code Status, Allergies Code Status Info: DNR Allergies Info: Penicillins           Current Medications (06/11/2023):  This is the current hospital active medication list Current Facility-Administered Medications  Medication Dose Route Frequency Provider Last Rate Last Admin   acetaminophen (TYLENOL) 160 MG/5ML solution 650 mg  650 mg Per Tube Q6H PRN Haddix, Gillie Manners, MD       aspirin chewable tablet 81 mg  81 mg Per Tube Daily Lonia Blood, MD   81 mg at 06/11/23 1219   carvedilol (COREG) tablet 3.125 mg  3.125 mg Per Tube BID WC Haddix, Gillie Manners, MD   3.125 mg at 06/11/23 0815   ceFAZolin (ANCEF) IVPB 2g/100 mL premix  2 g Intravenous Q8H Haddix, Gillie Manners, MD 200 mL/hr at 06/11/23 0818 2 g at 06/11/23 0818   donepezil (ARICEPT) tablet 10 mg  10 mg Per Tube QHS Haddix, Gillie Manners, MD   10 mg at 06/10/23 2223   feeding supplement (OSMOLITE 1.5 CAL) liquid 1,000 mL  1,000 mL Per Tube Continuous McClung,  Elpidio Eric, MD 50 mL/hr at 06/11/23 1219 1,000 mL at 06/11/23 1219   feeding supplement (PROSource TF20) liquid 60 mL  60 mL Per Tube Daily Lonia Blood, MD   60 mL at 06/11/23 1219   free water 150 mL  150 mL Per Tube Q4H Lonia Blood, MD   150 mL at 06/11/23 1219   HYDROcodone-acetaminophen (NORCO/VICODIN) 5-325 MG per tablet 1-2 tablet  1-2 tablet Per Tube Q6H PRN Haddix, Gillie Manners, MD   2 tablet at 06/10/23 2223   morphine (PF) 2 MG/ML injection 2 mg  2 mg Intravenous Q3H PRN Haddix, Gillie Manners, MD   2 mg at 06/10/23 1136    QUEtiapine (SEROQUEL) tablet 12.5 mg  12.5 mg Per Tube BID Haddix, Gillie Manners, MD   12.5 mg at 06/11/23 0815     Discharge Medications: Please see discharge summary for a list of discharge medications.  Relevant Imaging Results:  Relevant Lab Results:   Additional Information SS#: [id number]  Lorri Frederick, LCSW

## 2023-06-11 NOTE — Progress Notes (Signed)
Orthopaedic Trauma Progress Note  SUBJECTIVE: Doing fairly well today.  Resting comfortably in bed.  No acute events noted overnight.  OBJECTIVE:  Vitals:   06/11/23 0500 06/11/23 0758  BP: 123/80 121/80  Pulse: 76 97  Resp: 15 17  Temp: 97.7 F (36.5 C) 97.6 F (36.4 C)  SpO2: 95% 98%    General: Resting in bed comfortably, no acute distress Respiratory: No increased work of breathing.  Right lower extremity: Dressings clean, dry, intact.  No significant tenderness with palpation over the hip.  No calf tenderness.  Tolerates gentle passive ankle dorsiflexion/plantarflexion.  Skin warm and dry. + DP pulse  IMAGING: Stable post op imaging.   LABS:  Results for orders placed or performed during the hospital encounter of 06/09/23 (from the past 24 hour(s))  CBC     Status: Abnormal   Collection Time: 06/11/23  8:11 AM  Result Value Ref Range   WBC 11.2 (H) 4.0 - 10.5 K/uL   RBC 3.76 (L) 4.22 - 5.81 MIL/uL   Hemoglobin 11.5 (L) 13.0 - 17.0 g/dL   HCT 62.9 (L) 52.8 - 41.3 %   MCV 91.8 80.0 - 100.0 fL   MCH 30.6 26.0 - 34.0 pg   MCHC 33.3 30.0 - 36.0 g/dL   RDW 24.4 01.0 - 27.2 %   Platelets 246 150 - 400 K/uL   nRBC 0.0 0.0 - 0.2 %  Comprehensive metabolic panel     Status: Abnormal   Collection Time: 06/11/23  8:11 AM  Result Value Ref Range   Sodium 139 135 - 145 mmol/L   Potassium 4.2 3.5 - 5.1 mmol/L   Chloride 108 98 - 111 mmol/L   CO2 22 22 - 32 mmol/L   Glucose, Bld 116 (H) 70 - 99 mg/dL   BUN 29 (H) 8 - 23 mg/dL   Creatinine, Ser 5.36 0.61 - 1.24 mg/dL   Calcium 8.4 (L) 8.9 - 10.3 mg/dL   Total Protein 5.8 (L) 6.5 - 8.1 g/dL   Albumin 2.7 (L) 3.5 - 5.0 g/dL   AST 18 15 - 41 U/L   ALT 23 0 - 44 U/L   Alkaline Phosphatase 54 38 - 126 U/L   Total Bilirubin 0.8 0.3 - 1.2 mg/dL   GFR, Estimated >64 >40 mL/min   Anion gap 9 5 - 15  Magnesium     Status: None   Collection Time: 06/11/23  8:11 AM  Result Value Ref Range   Magnesium 2.3 1.7 - 2.4 mg/dL     ASSESSMENT: Casey Rangel is a 72 y.o. male, 1 Day Post-Op s/p INTRAMEDULLARY NAIL RIGHT INTERTROCHANTERIC FEMUR FRACTURE  CV/Blood loss: Acute blood loss anemia, Hgb 11.5 this morning. Hemodynamically stable  PLAN: Weightbearing: WBAT RLE ROM: Unrestricted ROM Incisional and dressing care: Reinforce dressings as needed  Showering: Okay to begin showering getting incisions wet 06/13/2023 Orthopedic device(s): None  Pain management:  1. Tylenol 650 mg q 6 hours PRN 2. Norco 5-325 mg q 4 hours PRN 3. Morphine 2mg  q 3 hours PRN VTE prophylaxis: Aspirin, SCDs ID:  Ancef 2gm post op Foley/Lines:  No foley, KVO IVFs Impediments to Fracture Healing: Vitamin D level 15 when checked in 2022, will recheck today.  Will start supplementation as indicated. Dispo: PT/OT evaluation today.  Okay to resume Brilinta from ortho standpoint.  Okay for discharge back to facility from ortho standpoint once cleared by medicine team and therapies  D/C recommendations: -Norco for pain control -Home dose aspirin and Brilinta for  DVT prophylaxis -Possible need for Vit D supplementation  Follow - up plan: 2 weeks after d/c for wound check and repeat x-rays   Contact information:  Truitt Merle MD, Thyra Breed PA-C. After hours and holidays please check [email or web address] for group call information for [facility]   Thompson Caul, PA-C [phone] (office) [email or web address]

## 2023-06-11 NOTE — Progress Notes (Signed)
   IR Note  Procedure with Dr Corliss Skains 04/16/23:   IMPRESSION: Status post endovascular revascularization of occluded left distal M1 segment with 1 pass with a 4 mm x 40 mm Solitaire X retrieval device and contact aspiration with reocclusion, followed by a second pass with a 3 mm x 40 mm Solitaire X retrieval device and contact aspiration with a TICI 3 revascularization and reocclusion due to underlying arteriosclerotic disease.   Status post rescue stent placement with a return of a TICI 2C revascularization with progressive slowing of hemodynamic flow through the stent requiring intra stent balloon angioplasty with 2 x 15 mm angioplasty balloon catheter with return of a TICI 3 revascularization.  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 Neurosurgery reviewed the films and recommended DAPT be held for 3 weeks with subsequent follow-up imaging.  Dr Corliss Skains made aware of admission Rec: DC Brilinta now Continue ASA 81 mg daily CT Head 3 weeks post DC  Dr Corliss Skains has spoken to pts sister Barbee Shropshire via phone  She is aware of recommendations Agreeable They will hear from [facility] scheduler for time and date of CT

## 2023-06-11 NOTE — Care Management Important Message (Signed)
Important Message  Patient Details  Name: Casey Rangel MRN: [id number] Date of Birth: 1951/01/10   Important Message Given:  Yes - Medicare IM     Sherilyn Banker 06/11/2023, 11:46 AM

## 2023-06-11 NOTE — Evaluation (Signed)
Physical Therapy Evaluation Patient Details Name: Casey Rangel MRN: [id number] DOB: November 20, 1950 Today's Date: 06/11/2023  History of Present Illness  Pt is a 72 y/o male presenting on 10/22 after fall. X ray with comminuted and displaced intertrochanteric R proximal femur fx. 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 S/P IM nail R hip on 10/23 and pt is now WBAT. PMH L hip fx ; T9-11 fusion, L MCA CVA with residual aphasia and recent L MCA CVA 03/2023, recent small volume SDH 01/2023, alzheimer's dementia.   Clinical Impression  Pt in bed upon arrival of PT, agreeable to evaluation at this time. Pt with aphasia and unable to contribute to PLOF, but per chart, pt at SNF since last stroke in August of this year. He required totalA for bed mobility, maxA for sit-stands with HHA, and minA of 2 for sit-stand with RW. The pt demos great initiation for standing, and was even able to manage small lateral steps along EOB this session. He is dependent on multimodal cues and increased time, but participated well. Recommend return to inpatient rehab when medically stable for d/c to maximize functional independence and safety with transfers prior to return home.       If plan is discharge home, recommend the following: Two people to help with walking and/or transfers;Two people to help with bathing/dressing/bathroom;Direct supervision/assist for medications management;Direct supervision/assist for financial management   Can travel by private vehicle   No    Equipment Recommendations Other (comment) (defer to post acute)  Recommendations for Other Services       Functional Status Assessment Patient has had a recent decline in their functional status and demonstrates the ability to make significant improvements in function in a reasonable and predictable amount of time.     Precautions / Restrictions Precautions Precautions: Fall Precaution Comments: aphasic at  baseline Restrictions Weight Bearing Restrictions: Yes RLE Weight Bearing: Weight bearing as tolerated      Mobility  Bed Mobility Overal bed mobility: Needs Assistance Bed Mobility: Supine to Sit, Sit to Supine     Supine to sit: Total assist, +2 for physical assistance, HOB elevated Sit to supine: Max assist, +2 for physical assistance, Used rails   General bed mobility comments: totalA to initiate movement to EOB, manage LE and trunk, and scoot to EOB on bed pad. pt assisted some with returning trunk to supine, needs assist at LE    Transfers Overall transfer level: Needs assistance Equipment used: 2 person hand held assist, Rolling walker (2 wheels) Transfers: Sit to/from Stand Sit to Stand: Min assist, Max assist, +2 physical assistance           General transfer comment: maxA of 2 with use of HHA of 2, minA of 2 with RW. poor wt acceptance RLE    Ambulation/Gait Ambulation/Gait assistance: Mod assist, +2 physical assistance Gait Distance (Feet): 3 Feet Assistive device: Rolling walker (2 wheels) Gait Pattern/deviations: Step-to pattern, Decreased stride length, Decreased weight shift to right       General Gait Details: small lateral steps along EOB, pt following gestural cues from therapist with instruction "step towards me" no buckling, minimal wt through RLE, pt needing assist, RW, and short stride length    Balance Overall balance assessment: Needs assistance Sitting-balance support: No upper extremity supported, Feet supported Sitting balance-Leahy Scale: Fair   Postural control: Posterior lean Standing balance support: Bilateral upper extremity supported, During functional activity Standing balance-Leahy Scale: Poor Standing balance comment: dependent on  BUE support and external support                             Pertinent Vitals/Pain Pain Assessment Pain Assessment: Faces Pain Score: 0-No pain Faces Pain Scale: Hurts a little  bit Pain Location: RLE with wt bearing, no facial expression but pt minimizing wt through RLE Pain Descriptors / Indicators: Guarding Pain Intervention(s): Monitored during session, Limited activity within patient's tolerance, Repositioned    Home Living Family/patient expects to be discharged to:: Skilled nursing facility                   Additional Comments: pt at SNF after CVA, per chart he lives with family members at baseline    Prior Function Prior Level of Function : Patient poor historian/Family not available                     Extremity/Trunk Assessment   Upper Extremity Assessment Upper Extremity Assessment: Defer to OT evaluation    Lower Extremity Assessment Lower Extremity Assessment: Difficult to assess due to impaired cognition (more guarded with RLE, suspect due to pain, limited ROM at knee in RLE. pt able to stand without buckling, and able to move LE through partial RO against gravity when sitting EOB)    Cervical / Trunk Assessment Cervical / Trunk Assessment: Kyphotic  Communication   Communication Communication: Difficulty following commands/understanding;Difficulty communicating thoughts/reduced clarity of speech Following commands: Follows one step commands inconsistently Cueing Techniques: Verbal cues;Gestural cues;Tactile cues;Visual cues  Cognition Arousal: Alert Behavior During Therapy: Flat affect Overall Cognitive Status: Difficult to assess                                 General Comments: difficult to assess due to aphasia, lack of family present. pt initially not mimicing movements from therapists, but once sitting EOB did repeat movements shown by therapists        General Comments General comments (skin integrity, edema, etc.): VSS on RA        Assessment/Plan    PT Assessment Patient needs continued PT services  PT Problem List Decreased strength;Decreased range of motion;Decreased activity  tolerance;Decreased balance;Decreased mobility;Decreased safety awareness;Pain       PT Treatment Interventions DME instruction;Gait training;Functional mobility training;Therapeutic activities;Therapeutic exercise;Balance training    PT Goals (Current goals can be found in the Care Plan section)  Acute Rehab PT Goals Patient Stated Goal: none stated PT Goal Formulation: Patient unable to participate in goal setting Time For Goal Achievement: 06/25/23 Potential to Achieve Goals: Good    Frequency Min 1X/week     Co-evaluation PT/OT/SLP Co-Evaluation/Treatment: Yes Reason for Co-Treatment: Complexity of the patient's impairments (multi-system involvement);Necessary to address cognition/behavior during functional activity;For patient/therapist safety;To address functional/ADL transfers PT goals addressed during session: Mobility/safety with mobility;Balance;Proper use of DME         AM-PAC PT "6 Clicks" Mobility  Outcome Measure Help needed turning from your back to your side while in a flat bed without using bedrails?: Total Help needed moving from lying on your back to sitting on the side of a flat bed without using bedrails?: Total Help needed moving to and from a bed to a chair (including a wheelchair)?: Total Help needed standing up from a chair using your arms (e.g., wheelchair or bedside chair)?: Total Help needed to walk in hospital room?: Total Help  needed climbing 3-5 steps with a railing? : Total 6 Click Score: 6    End of Session Equipment Utilized During Treatment: Gait belt Activity Tolerance: Patient tolerated treatment well Patient left: in bed;with call bell/phone within reach;with bed alarm set;with SCD's reapplied Nurse Communication: Mobility status PT Visit Diagnosis: Other abnormalities of gait and mobility (R26.89);Unsteadiness on feet (R26.81);Muscle weakness (generalized) (M62.81);Pain Pain - Right/Left: Right Pain - part of body: Hip    Time:  1308-6578 PT Time Calculation (min) (ACUTE ONLY): 25 min   Charges:   PT Evaluation $PT Eval Moderate Complexity: 1 Mod   PT General Charges $$ ACUTE PT VISIT: 1 Visit         Vickki Muff, PT, DPT   Acute Rehabilitation Department Office [phone] Secure Chat Communication Preferred  Ronnie Derby 06/11/2023, 1:06 PM

## 2023-06-11 NOTE — Plan of Care (Signed)
  Problem: Respiratory: Goal: Ability to maintain a clear airway and adequate ventilation will improve Outcome: Progressing   Problem: Clinical Measurements: Goal: Will remain free from infection Outcome: Progressing

## 2023-06-11 NOTE — Evaluation (Signed)
Occupational Therapy Evaluation Patient Details Name: Casey Rangel MRN: [id number] DOB: 1950/11/26 Today's Date: 06/11/2023   History of Present Illness Pt is a 72 y/o male presenting on 10/22 after fall. X ray with comminuted and displaced intertrochanteric R proximal femur fx. 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 S/P IM nail R hip on 10/23 and pt is now WBAT. PMH L hip fx ; T9-11 fusion, L MCA CVA with residual aphasia and recent L MCA CVA 03/2023, recent small volume SDH 01/2023, alzheimer's dementia.   Clinical Impression   Patient admitted for above and presents with problem list below. Patient with hx of aphaisa, no verbalizations attempted but pt able to copy some movements and complete some simple automatic tasks with increased time.  He requires max -total assist +2 for bed mobility, min to max assist +2 to stand (decreased assist with use of RW), and requires min to total assist for ADLs. Based on performance today, believe patient will best benefit from continued OT services acutely and after dc at inpatient setting with <3hrs/day to optimize independence, safety with ADLs and mobility.      If plan is discharge home, recommend the following: Two people to help with walking and/or transfers;Two people to help with bathing/dressing/bathroom;Direct supervision/assist for medications management;Direct supervision/assist for financial management    Functional Status Assessment  Patient has had a recent decline in their functional status and demonstrates the ability to make significant improvements in function in a reasonable and predictable amount of time.  Equipment Recommendations  Other (comment) (defer)    Recommendations for Other Services       Precautions / Restrictions Precautions Precautions: Fall Precaution Comments: aphasic at baseline Restrictions Weight Bearing Restrictions: Yes RLE Weight Bearing: Weight bearing as  tolerated      Mobility Bed Mobility Overal bed mobility: Needs Assistance Bed Mobility: Supine to Sit, Sit to Supine     Supine to sit: Total assist, +2 for physical assistance, HOB elevated Sit to supine: Max assist, +2 for physical assistance, Used rails   General bed mobility comments: totalA to initiate movement to EOB, manage LE and trunk, and scoot to EOB on bed pad. pt assisted some with returning trunk to supine, needs assist at LE    Transfers Overall transfer level: Needs assistance Equipment used: 2 person hand held assist, Rolling walker (2 wheels) Transfers: Sit to/from Stand Sit to Stand: Min assist, Max assist, +2 physical assistance           General transfer comment: maxA of 2 with use of HHA of 2, minA of 2 with RW. poor wt acceptance RLE      Balance Overall balance assessment: Needs assistance Sitting-balance support: No upper extremity supported, Feet supported Sitting balance-Leahy Scale: Fair Sitting balance - Comments: statically min guard for safety Postural control: Posterior lean Standing balance support: Bilateral upper extremity supported, During functional activity Standing balance-Leahy Scale: Poor Standing balance comment: dependent on BUE support and external support                           ADL either performed or assessed with clinical judgement   ADL Overall ADL's : Needs assistance/impaired Eating/Feeding: NPO Eating/Feeding Details (indicate cue type and reason): PEG at baseline Grooming: Moderate assistance;Sitting           Upper Body Dressing : Maximal assistance;Sitting   Lower Body Dressing: Total assistance;+2 for physical assistance;Sit to/from stand  Toilet Transfer: Moderate assistance;Minimal assistance;+2 for physical assistance Toilet Transfer Details (indicate cue type and reason): simulated, side stepping to HOB. Using RW.         Functional mobility during ADLs: Moderate assistance;Maximal  assistance;+2 for physical assistance;Rolling walker (2 wheels)       Vision   Additional Comments: not tested     Perception         Praxis         Pertinent Vitals/Pain Pain Assessment Pain Assessment: Faces Faces Pain Scale: Hurts a little bit Pain Location: RLE with wt bearing, no facial expression but pt minimizing wt through RLE Pain Descriptors / Indicators: Guarding Pain Intervention(s): Monitored during session, Repositioned, Limited activity within patient's tolerance     Extremity/Trunk Assessment Upper Extremity Assessment Upper Extremity Assessment: Difficult to assess due to impaired cognition (grossly 3+/5, R UE slightly weaker than L UE.)   Lower Extremity Assessment Lower Extremity Assessment: Defer to PT evaluation   Cervical / Trunk Assessment Cervical / Trunk Assessment: Kyphotic   Communication Communication Communication: Difficulty following commands/understanding;Difficulty communicating thoughts/reduced clarity of speech Following commands: Follows one step commands inconsistently;Follows one step commands with increased time Cueing Techniques: Verbal cues;Gestural cues;Tactile cues;Visual cues   Cognition Arousal: Alert Behavior During Therapy: Flat affect Overall Cognitive Status: Difficult to assess                                 General Comments: difficult to assess due to aphasia, lack of family present. pt initially not mimicing movements from therapists, but once sitting EOB did repeat movements shown by therapists. Does better with automatic tasks.     General Comments       Exercises     Shoulder Instructions      Home Living Family/patient expects to be discharged to:: Skilled nursing facility                                 Additional Comments: pt at SNF after CVA, per chart he lives with family members at baseline      Prior Functioning/Environment Prior Level of Function : Patient poor  historian/Family not available                        OT Problem List: Decreased strength;Decreased activity tolerance;Impaired balance (sitting and/or standing);Decreased coordination;Decreased cognition;Decreased safety awareness;Decreased knowledge of use of DME or AE;Decreased knowledge of precautions;Cardiopulmonary status limiting activity;Pain      OT Treatment/Interventions: Therapeutic exercise;Self-care/ADL training;DME and/or AE instruction;Therapeutic activities;Cognitive remediation/compensation;Patient/family education;Balance training    OT Goals(Current goals can be found in the care plan section) Acute Rehab OT Goals Patient Stated Goal: none stated OT Goal Formulation: Patient unable to participate in goal setting Time For Goal Achievement: 06/25/23 Potential to Achieve Goals: Fair  OT Frequency: Min 1X/week    Co-evaluation PT/OT/SLP Co-Evaluation/Treatment: Yes Reason for Co-Treatment: Complexity of the patient's impairments (multi-system involvement);Necessary to address cognition/behavior during functional activity;For patient/therapist safety;To address functional/ADL transfers PT goals addressed during session: Mobility/safety with mobility;Balance;Proper use of DME OT goals addressed during session: ADL's and self-care      AM-PAC OT "6 Clicks" Daily Activity     Outcome Measure Help from another person eating meals?: Total Help from another person taking care of personal grooming?: A Lot Help from another person toileting, which includes using toliet, bedpan,  or urinal?: A Lot Help from another person bathing (including washing, rinsing, drying)?: A Lot Help from another person to put on and taking off regular upper body clothing?: A Lot Help from another person to put on and taking off regular lower body clothing?: Total 6 Click Score: 10   End of Session Equipment Utilized During Treatment: Rolling walker (2 wheels);Gait belt Nurse Communication:  Mobility status  Activity Tolerance: Patient tolerated treatment well Patient left: with call bell/phone within reach;in bed;with bed alarm set;with SCD's reapplied  OT Visit Diagnosis: Other abnormalities of gait and mobility (R26.89);Muscle weakness (generalized) (M62.81);History of falling (Z91.81);Pain;Other symptoms and signs involving cognitive function;Other symptoms and signs involving the nervous system (R29.898) Pain - Right/Left: Right Pain - part of body: Hip                Time: 8144-8185 OT Time Calculation (min): 22 min Charges:  OT General Charges $OT Visit: 1 Visit OT Evaluation $OT Eval Moderate Complexity: 1 Mod  Barry Brunner, OT Acute Rehabilitation Services Office [phone]   Chancy Milroy 06/11/2023, 2:04 PM

## 2023-06-11 NOTE — Progress Notes (Signed)
Casey Rangel  FAO:[id number] DOB: [birth year] DOA: 06/09/2023 PCP: Mort Sawyers, FNP    Brief Narrative:  72 year old with a history of left MCA stroke with residual aphasia status post IR mechanical thrombectomy and stenting 04/16/2023, dementia, HTN, HLD, and dysphagia with PEG tube dependence who presented to the ER 10/22 after suffering a mechanical fall from his wheelchair.  X-rays on arrival demonstrated a comminuted displaced intertrochanteric right femur fracture.  CT head at presentation revealed subacute to chronic bilateral subdural fluid collections without mass effect.  Neurosurgery reviewed the films and recommended DAPT be held for 3 weeks with subsequent follow-up imaging.  Goals of Care:   Code Status: Limited: Do not attempt resuscitation (DNR) -DNR-LIMITED -Do Not Intubate/DNI    DVT prophylaxis: SCDs Start: 06/09/23 2242   Interim Hx: Patient underwent successful Orthopedic Surgery yesterday to correct his femur fracture.  No acute events recorded overnight.  Afebrile.  Vital signs stable.  Appears to be resting comfortably in bed at time of my visit.  Is alert and interactive but nonverbal, as per baseline.  Assessment & Plan:  Acute right femur fracture status post mechanical fall Patient taken to the OR 10/23 - postop care per Orthopedic Surgery  Subacute/chronic bilateral subdural hematoma appreciated on CT head during this presentation with radiologist reporting they were also present on MRI 04/16/2023 (though not mentioned in the formal dictation at that time) - patient was previously on aspirin and Brilinta status post intracranial stenting - case discussed with Neurosurgery by the ER team and the recommendation was to hold DAPT for 3 weeks with subsequent follow-up imaging -given the high risk associated with this I discussed the patient's care with the IR radiology team who felt that discontinuation of Brilinta was reasonable but suggested continuing low-dose  aspirin in hopes of maintaining stent patency -I agree this is a reasonable approach and these orders have been entered  Left MCA stroke status post mechanical thrombectomy and stenting 04/16/2023 Care discussed with IR radiology team as noted above -I have asked for them to see the patient in follow-up to monitor these subdural collections  Aphasia A chronic consequence of his above-noted CVA  Vascular dementia Appears to be at his baseline  Chronic dysphagia with PEG tube dependence Reportedly the patient no longer attempts to chew when oral intake is attempted - continue PEG feeding   HTN Blood pressure well controlled at present  Family Communication: No family present at time of exam Disposition: Anticipate return to SNF when medically stable   Objective: Blood pressure 121/80, pulse 97, temperature 97.6 F (36.4 C), resp. rate 17, SpO2 98%.  Intake/Output Summary (Last 24 hours) at 06/11/2023 0940 Last data filed at 06/11/2023 0400 Gross per 24 hour  Intake 825 ml  Output 50 ml  Net 775 ml   There were no vitals filed for this visit.  Examination: General: No acute respiratory distress Lungs: Clear to auscultation bilaterally without wheezes or crackles Cardiovascular: RRR without murmur Abdomen: NT/ND, soft, BS positive, no rebound Extremities: No significant cyanosis, clubbing, or edema bilateral lower extremities  CBC: Recent Labs  Lab 06/09/23 2159 06/11/23 0811  WBC 13.9* 11.2*  NEUTROABS 10.9*  --   HGB 13.5 11.5*  HCT 39.2 34.5*  MCV 92.2 91.8  PLT 267 246   Basic Metabolic Panel: Recent Labs  Lab 06/09/23 2159 06/11/23 0811  NA 138 139  K 3.9 4.2  CL 107 108  CO2 21* 22  GLUCOSE 105* 116*  BUN 25* 29*  CREATININE 0.80 0.88  CALCIUM 8.5* 8.4*  MG  --  2.3   GFR: Estimated Creatinine Clearance: 72.3 mL/min (by C-G formula based on SCr of 0.88 mg/dL).   Scheduled Meds:  carvedilol  3.125 mg Per Tube BID WC   donepezil  10 mg Per Tube  QHS   feeding supplement (PROSource TF20)  60 mL Per Tube Daily   free water  150 mL Per Tube Q4H   QUEtiapine  12.5 mg Per Tube BID   Continuous Infusions:   ceFAZolin (ANCEF) IV 2 g (06/11/23 0818)   feeding supplement (OSMOLITE 1.5 CAL)       LOS: 2 days   Lonia Blood, MD [facility] Office  [phone] Pager - Text Page per Loretha Stapler  If 7PM-7AM, please contact night-coverage per Amion 06/11/2023, 9:40 AM

## 2023-06-11 NOTE — TOC Initial Note (Signed)
Transition of Care [facility]) - Initial/Assessment Note    Patient Details  Name: Casey Rangel MRN: [id number] Date of Birth: Jul 03, 1951  [facility]) CM/SW Contact:    Lorri Frederick, LCSW Phone Number: 06/11/2023, 1:25 PM  Clinical Narrative:    Pt oriented x0.  CSW spoke with sister [name] by phone regarding PT recommendation for SNF.  Pt has been at [facility] and [place] states this is long term arrangement.  Pt listed with only medicare but [name] reports does have medicaid, provided PI#[id number].  [name] is not POA, she did attempt previously but pt was not competent to sign the paperwork.  CSW spoke with [name].  She said pt is there  as short term pt.  We discussed sister [name] being under the impression pt is long term.  [name] will talk with business office.  [facility] can receive pt back as [facility] once ready for DC.  Medicare payer with inpt order 06/09/23.      CSW spoke with sister [name] again, informed her of the above.  She said she has attempted to call [facility] several times but only gets put on hold.  She does want to discuss short term vs long term with them.  CSW awaiting response from [facility] regarding contacting sister.           Expected Discharge Plan: Skilled Nursing Facility Barriers to Discharge: Continued Medical Work up   Patient Goals and CMS Choice     Choice offered to / list presented to : Sibling (sister [name])      Expected Discharge Plan and Services In-house Referral: Clinical Social Work   Post Acute Care Choice: Skilled Nursing Facility Living arrangements for the past 2 months: [employer])                                      Prior Living Arrangements/Services Living arrangements for the past 2 months: Skilled Nursing Facility [employer]) Lives with:: Siblings (sister [name])          Need for Family Participation in Patient Care: Yes (Comment) Care giver  support system in place?: Yes (comment) Current home services: Other (comment) (na) Criminal Activity/Legal Involvement Pertinent to Current Situation/Hospitalization: No - Comment as needed  Activities of Daily Living   ADL Screening (condition at time of admission) Independently performs ADLs?: No Does the patient have a NEW difficulty with bathing/dressing/toileting/self-feeding that is expected to last >3 days?: No Does the patient have a NEW difficulty with getting in/out of bed, walking, or climbing stairs that is expected to last >3 days?: No Does the patient have a NEW difficulty with communication that is expected to last >3 days?: No Is the patient deaf or have difficulty hearing?: No Does the patient have difficulty seeing, even when wearing glasses/contacts?: No Does the patient have difficulty concentrating, remembering, or making decisions?: Yes  Permission Sought/Granted                  Emotional Assessment Appearance:: Appears stated age Attitude/Demeanor/Rapport: Unable to Assess Affect (typically observed): Unable to Assess Orientation: :  (not oriented)      Admission diagnosis:  Hip fracture (HCC) [S72.009A] Closed fracture of right hip, initial encounter [facility]) [S72.001A] Patient Active Problem List   Diagnosis Date Noted   Right femoral fracture (HCC) 06/09/2023   History of stroke 06/09/2023   Hip fracture (HCC) 06/09/2023  Dysphagia 06/09/2023   PEG (percutaneous endoscopic gastrostomy) status (HCC) 06/09/2023   Subdural hematoma (HCC) 06/09/2023   Malnutrition of moderate degree 04/21/2023   Acute respiratory failure with hypoxia (HCC) 04/16/2023   Middle cerebral artery embolism, 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   Vascular dementia (HCC) 03/20/2022   Acute cognitive decline 03/11/2022   Mixed hyperlipidemia 03/11/2022   Constipation 03/11/2022   Acute combined systolic and diastolic heart failure (HCC)    Acute ischemic left MCA stroke (HCC) 01/15/2022    HTN (hypertension) 11/14/2020   PCP:  Mort Sawyers, FNP Pharmacy:   [facility] [phone] - [address], [place] - [address] [place] [address] Phone: [phone] Fax: [phone]  [facility] [address] [place] [address] Phone: [phone] Fax: [phone]     Social Determinants of Health (SDOH) Social History: SDOH Screenings   Food Insecurity: Patient Unable To Answer (06/11/2023)  Housing: Patient Unable To Answer (06/11/2023)  Transportation Needs: Patient Unable To Answer (06/11/2023)  Utilities: Patient Unable To Answer (06/11/2023)  Physical Activity: Inactive (03/12/2022)  Social Connections: Socially Isolated (03/12/2022)  Tobacco Use: Medium Risk (06/10/2023)   SDOH Interventions:     Readmission Risk Interventions     No data to display

## 2023-06-12 LAB — COMPREHENSIVE METABOLIC PANEL
ALT: 20 U/L (ref 0–44)
AST: 21 U/L (ref 15–41)
Albumin: 2.5 g/dL — ABNORMAL LOW (ref 3.5–5.0)
Alkaline Phosphatase: 47 U/L (ref 38–126)
Anion gap: 10 (ref 5–15)
BUN: 36 mg/dL — ABNORMAL HIGH (ref 8–23)
CO2: 22 mmol/L (ref 22–32)
Calcium: 8.1 mg/dL — ABNORMAL LOW (ref 8.9–10.3)
Chloride: 106 mmol/L (ref 98–111)
Creatinine, Ser: 0.7 mg/dL (ref 0.61–1.24)
GFR, Estimated: >60 mL/min (ref >60)
Glucose, Bld: 140 mg/dL — ABNORMAL HIGH (ref 70–99)
Potassium: 3.5 mmol/L (ref 3.5–5.1)
Sodium: 138 mmol/L (ref 135–145)
Total Bilirubin: 0.5 mg/dL (ref 0.3–1.2)
Total Protein: 5.4 g/dL — ABNORMAL LOW (ref 6.5–8.1)

## 2023-06-12 LAB — CBC
HCT: 29.6 % — ABNORMAL LOW (ref 39.0–52.0)
Hemoglobin: 9.8 g/dL — ABNORMAL LOW (ref 13.0–17.0)
MCH: 31.1 pg (ref 26.0–34.0)
MCHC: 33.1 g/dL (ref 30.0–36.0)
MCV: 94 fL (ref 80.0–100.0)
Platelets: 251 10*3/uL (ref 150–400)
RBC: 3.15 MIL/uL — ABNORMAL LOW (ref 4.22–5.81)
RDW: 15.1 % (ref 11.5–15.5)
WBC: 9.3 10*3/uL (ref 4.0–10.5)
nRBC: 0 % (ref 0.0–0.2)

## 2023-06-12 LAB — GLUCOSE, CAPILLARY
Glucose-Capillary: 124 mg/dL — ABNORMAL HIGH (ref 70–99)
Glucose-Capillary: 130 mg/dL — ABNORMAL HIGH (ref 70–99)
Glucose-Capillary: 135 mg/dL — ABNORMAL HIGH (ref 70–99)

## 2023-06-12 LAB — PHOSPHORUS: Phosphorus: 2.2 mg/dL — ABNORMAL LOW (ref 2.5–4.6)

## 2023-06-12 LAB — MAGNESIUM: Magnesium: 2.1 mg/dL (ref 1.7–2.4)

## 2023-06-12 MED ORDER — DONEPEZIL HCL 10 MG PO TABS: 10.0000 mg | ORAL_TABLET | Freq: Every day | ORAL | Status: DC

## 2023-06-12 MED ORDER — PROSOURCE TF20 ENFIT COMPATIBL EN LIQD: 60.0000 mL | Freq: Every day | ENTERAL | Status: DC

## 2023-06-12 MED ORDER — OSMOLITE 1.5 CAL PO LIQD: 1000.0000 mL | ORAL | Status: DC

## 2023-06-12 MED ORDER — FREE WATER: 150.0000 mL | Status: DC

## 2023-06-12 MED ORDER — HYDROCODONE-ACETAMINOPHEN 5-325 MG PO TABS: 1.0000 | ORAL_TABLET | Freq: Four times a day (QID) | ORAL | 0 refills | Status: DC | PRN

## 2023-06-12 MED ORDER — VITAMIN D (CHOLECALCIFEROL) 25 MCG (1000 UT) PO CAPS: 1000.0000 [IU] | ORAL_CAPSULE | Freq: Every day | ORAL | 1 refills | Status: DC

## 2023-06-12 MED ORDER — ACETAMINOPHEN 160 MG/5ML PO SOLN: 650.0000 mg | Freq: Four times a day (QID) | ORAL | Status: DC | PRN

## 2023-06-12 NOTE — Progress Notes (Signed)
Orthopaedic Trauma Progress Note  SUBJECTIVE: Doing fairly well today.  Resting comfortably in bed.  No acute events noted overnight.  OBJECTIVE:  Vitals:   06/12/23 0453 06/12/23 0743  BP: 133/64 133/72  Pulse: 60 66  Resp: 17 16  Temp:  (!) 97.5 F (36.4 C)  SpO2: 98% 99%    General: Resting in bed comfortably, no acute distress Respiratory: No increased work of breathing.  Right lower extremity: Dressings removed, incisions are clean, dry, intact.  No significant tenderness with palpation over the hip.  No calf tenderness.  Tolerates gentle passive ankle dorsiflexion/plantarflexion.  Spontaneously moves toes small amount. Skin warm and dry. + DP pulse  IMAGING: Stable post op imaging.   LABS:  Results for orders placed or performed during the hospital encounter of 06/09/23 (from the past 24 hour(s))  Glucose, capillary     Status: Abnormal   Collection Time: 06/11/23 12:00 PM  Result Value Ref Range   Glucose-Capillary 102 (H) 70 - 99 mg/dL  Magnesium     Status: None   Collection Time: 06/11/23 12:59 PM  Result Value Ref Range   Magnesium 2.2 1.7 - 2.4 mg/dL  Phosphorus     Status: None   Collection Time: 06/11/23 12:59 PM  Result Value Ref Range   Phosphorus 3.5 2.5 - 4.6 mg/dL  VITAMIN D 25 Hydroxy (Vit-D Deficiency, Fractures)     Status: Abnormal   Collection Time: 06/11/23 12:59 PM  Result Value Ref Range   Vit D, 25-Hydroxy 25.06 (L) 30 - 100 ng/mL  Glucose, capillary     Status: Abnormal   Collection Time: 06/11/23  8:34 PM  Result Value Ref Range   Glucose-Capillary 132 (H) 70 - 99 mg/dL  Glucose, capillary     Status: Abnormal   Collection Time: 06/12/23 12:09 AM  Result Value Ref Range   Glucose-Capillary 135 (H) 70 - 99 mg/dL  Glucose, capillary     Status: Abnormal   Collection Time: 06/12/23  4:51 AM  Result Value Ref Range   Glucose-Capillary 124 (H) 70 - 99 mg/dL  Comprehensive metabolic panel     Status: Abnormal   Collection Time: 06/12/23   7:41 AM  Result Value Ref Range   Sodium 138 135 - 145 mmol/L   Potassium 3.5 3.5 - 5.1 mmol/L   Chloride 106 98 - 111 mmol/L   CO2 22 22 - 32 mmol/L   Glucose, Bld 140 (H) 70 - 99 mg/dL   BUN 36 (H) 8 - 23 mg/dL   Creatinine, Ser 6.96 0.61 - 1.24 mg/dL   Calcium 8.1 (L) 8.9 - 10.3 mg/dL   Total Protein 5.4 (L) 6.5 - 8.1 g/dL   Albumin 2.5 (L) 3.5 - 5.0 g/dL   AST 21 15 - 41 U/L   ALT 20 0 - 44 U/L   Alkaline Phosphatase 47 38 - 126 U/L   Total Bilirubin 0.5 0.3 - 1.2 mg/dL   GFR, Estimated >29 >52 mL/min   Anion gap 10 5 - 15  CBC     Status: Abnormal   Collection Time: 06/12/23  7:41 AM  Result Value Ref Range   WBC 9.3 4.0 - 10.5 K/uL   RBC 3.15 (L) 4.22 - 5.81 MIL/uL   Hemoglobin 9.8 (L) 13.0 - 17.0 g/dL   HCT 84.1 (L) 32.4 - 40.1 %   MCV 94.0 80.0 - 100.0 fL   MCH 31.1 26.0 - 34.0 pg   MCHC 33.1 30.0 - 36.0 g/dL  RDW 15.1 11.5 - 15.5 %   Platelets 251 150 - 400 K/uL   nRBC 0.0 0.0 - 0.2 %  Magnesium     Status: None   Collection Time: 06/12/23  7:41 AM  Result Value Ref Range   Magnesium 2.1 1.7 - 2.4 mg/dL  Phosphorus     Status: Abnormal   Collection Time: 06/12/23  7:41 AM  Result Value Ref Range   Phosphorus 2.2 (L) 2.5 - 4.6 mg/dL  Glucose, capillary     Status: Abnormal   Collection Time: 06/12/23  7:41 AM  Result Value Ref Range   Glucose-Capillary 130 (H) 70 - 99 mg/dL    ASSESSMENT: Pau Zeldin is a 72 y.o. male, 2 Days Post-Op s/p INTRAMEDULLARY NAIL RIGHT INTERTROCHANTERIC FEMUR FRACTURE  CV/Blood loss: Acute blood loss anemia, Hgb 9.8 this morning. Hemodynamically stable  PLAN: Weightbearing: WBAT RLE ROM: Unrestricted ROM Incisional and dressing care: Ok to leave open to air Showering: Okay to begin showering getting incisions wet 06/13/2023 Orthopedic device(s): None  Pain management:  1. Tylenol 650 mg q 6 hours PRN 2. Norco 5-325 mg q 4 hours PRN 3. Morphine 2mg  q 3 hours PRN VTE prophylaxis: Aspirin, SCDs ID:  Ancef 2gm post op  completed Foley/Lines: Foley in place. KVO IVFs Impediments to Fracture Healing: Vitamin D level 25, start supplementation Dispo: PT/OT evaluation ongoing. Okay to resume Brilinta from ortho standpoint.  Okay for discharge back to facility from ortho standpoint once cleared by medicine team and therapies  D/C recommendations: -Norco for pain control -Home dose aspirin and Brilinta for DVT prophylaxis -Continue 1000 units Vit D supplementation daily   Follow - up plan: 2 weeks after d/c for wound check and repeat x-rays   Contact information:  Truitt Merle MD, Thyra Breed PA-C. After hours and holidays please check [email or web address] for group call information for [facility]   Thompson Caul, PA-C [phone] (office) [email or web address]

## 2023-06-12 NOTE — TOC Transition Note (Signed)
Transition of Care [facility]) - CM/SW Discharge Note   Patient Details  Name: Casey Rangel MRN: [id number] Date of Birth: October 23, 1950  Transition of Care [facility]) CM/SW Contact:  Lorri Frederick, LCSW Phone Number: 06/12/2023, 11:33 AM   Clinical Narrative:   Pt discharging to [facility].  RN call report to [phone].   1100: CSW confirmed with [name] that they can receive pt today. CSW spoke with sister Casey Rangel.  She did speak with [facility] yesterday and they are working on [facility] application for LTC.   Final next level of care: Skilled Nursing Facility Barriers to Discharge: Barriers Resolved   Patient Goals and CMS Choice   Choice offered to / list presented to : Sibling (sister Casey Rangel)  Discharge Placement                Patient chooses bed at: [facility] Patient to be transferred to facility by: ptar Name of family member notified: sister Casey Rangel Patient and family notified of of transfer: 06/12/23  Discharge Plan and Services Additional resources added to the After Visit Summary for   In-house Referral: Clinical Social Work   Post Acute Care Choice: Skilled Nursing Facility                               Social Determinants of Health (SDOH) Interventions SDOH Screenings   Food Insecurity: Patient Unable To Answer (06/11/2023)  Housing: Patient Unable To Answer (06/11/2023)  Transportation Needs: Patient Unable To Answer (06/11/2023)  Utilities: Patient Unable To Answer (06/11/2023)  Physical Activity: Inactive (03/12/2022)  Social Connections: Socially Isolated (03/12/2022)  Tobacco Use: Medium Risk (06/10/2023)     Readmission Risk Interventions     No data to display

## 2023-06-12 NOTE — Care Plan (Signed)
Patient discharged back to [facility]. Patient being transported by [facility]. Report given to S. Excell Seltzer, LPN at facility. All personal belongings taken with patient.

## 2023-06-12 NOTE — Progress Notes (Signed)
Physical Therapy Treatment Patient Details Name: Casey Rangel MRN: [id number] DOB: Mar 04, 1951 Today's Date: 06/12/2023   History of Present Illness Pt is a 72 y/o male presenting on 10/22 after fall. X ray with comminuted and displaced intertrochanteric R proximal femur fx. 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 S/P IM nail R hip on 10/23 and pt is now WBAT. PMH L hip fx ; T9-11 fusion, L MCA CVA with residual aphasia and recent L MCA CVA 03/2023, recent small volume SDH 01/2023, alzheimer's dementia.    PT Comments  The pt was agreeable to session, was able to progress mobility well this morning as he needed less assistance for bed mobility and was able to ambulate farther in his room. He continues to need modA of 2 to initiate activities, to assist with RLE movement, and to steady with gait, but he is making good progress at this time. He did show more grimacing today, possibly more pain in RLE with movement today. RN present and aware. Continue to recommend return to facility for continued rehab.     If plan is discharge home, recommend the following: Two people to help with walking and/or transfers;Two people to help with bathing/dressing/bathroom;Direct supervision/assist for medications management;Direct supervision/assist for financial management   Can travel by private vehicle     No  Equipment Recommendations  Other (comment) (defer to post acute)    Recommendations for Other Services       Precautions / Restrictions Precautions Precautions: Fall Precaution Comments: aphasic at baseline Restrictions Weight Bearing Restrictions: Yes RLE Weight Bearing: Weight bearing as tolerated     Mobility  Bed Mobility Overal bed mobility: Needs Assistance Bed Mobility: Supine to Sit     Supine to sit: +2 for physical assistance, Mod assist, HOB elevated     General bed mobility comments: modA to initiate movement with LE to reach EOB,  assist at trunk and at bed pad to complete scoot but pt assisting with UE to elevate and scoot    Transfers Overall transfer level: Needs assistance Equipment used: Rolling walker (2 wheels) Transfers: Sit to/from Stand Sit to Stand: Min assist, +2 physical assistance           General transfer comment: minA of 2 with pts hands on RW.    Ambulation/Gait Ambulation/Gait assistance: Mod assist, +2 physical assistance Gait Distance (Feet): 6 Feet Assistive device: Rolling walker (2 wheels) Gait Pattern/deviations: Step-to pattern, Decreased stride length, Decreased weight shift to right, Decreased stance time - right, Decreased step length - left Gait velocity: decreased Gait velocity interpretation: <1.31 ft/sec, indicative of household ambulator   General Gait Details: pt with R knee flexed and minimal wt acceptance, small forwards steps from EOB with chair follow.   Stairs             Wheelchair Mobility     Tilt Bed    Modified Rankin (Stroke Patients Only)       Balance Overall balance assessment: Needs assistance Sitting-balance support: No upper extremity supported, Feet supported Sitting balance-Leahy Scale: Fair   Postural control: Posterior lean Standing balance support: Bilateral upper extremity supported, During functional activity Standing balance-Leahy Scale: Poor Standing balance comment: dependent on BUE support and external support                            Cognition Arousal: Alert Behavior During Therapy: Flat affect Overall Cognitive Status: Difficult to assess  General Comments: difficult to assess due to aphasia, lack of family present. pt initially not mimicing movements from therapists, but once sitting EOB did repeat movements shown by therapists        Exercises      General Comments General comments (skin integrity, edema, etc.): VSS on RA      Pertinent  Vitals/Pain Pain Assessment Pain Assessment: Faces Faces Pain Scale: Hurts little more Pain Location: RLE with wt bearing, no facial expression but pt minimizing wt through RLE Pain Descriptors / Indicators: Guarding Pain Intervention(s): Limited activity within patient's tolerance, Monitored during session, Repositioned    Home Living                          Prior Function            PT Goals (current goals can now be found in the care plan section) Acute Rehab PT Goals Patient Stated Goal: none stated PT Goal Formulation: Patient unable to participate in goal setting Time For Goal Achievement: 06/25/23 Potential to Achieve Goals: Good Progress towards PT goals: Progressing toward goals    Frequency    Min 1X/week       AM-PAC PT "6 Clicks" Mobility   Outcome Measure  Help needed turning from your back to your side while in a flat bed without using bedrails?: A Lot Help needed moving from lying on your back to sitting on the side of a flat bed without using bedrails?: A Lot Help needed moving to and from a bed to a chair (including a wheelchair)?: Total Help needed standing up from a chair using your arms (e.g., wheelchair or bedside chair)?: Total Help needed to walk in hospital room?: Total Help needed climbing 3-5 steps with a railing? : Total 6 Click Score: 8    End of Session Equipment Utilized During Treatment: Gait belt Activity Tolerance: Patient tolerated treatment well Patient left: with call bell/phone within reach;in chair;with chair alarm set;with nursing/sitter in room Nurse Communication: Mobility status PT Visit Diagnosis: Other abnormalities of gait and mobility (R26.89);Unsteadiness on feet (R26.81);Muscle weakness (generalized) (M62.81);Pain Pain - Right/Left: Right Pain - part of body: Hip     Time: 8469-6295 PT Time Calculation (min) (ACUTE ONLY): 18 min  Charges:    $Gait Training: 8-22 mins PT General Charges $$ ACUTE PT  VISIT: 1 Visit                     Vickki Muff, PT, DPT   Acute Rehabilitation Department Office [phone] Secure Chat Communication Preferred   Ronnie Derby 06/12/2023, 11:53 AM

## 2023-06-12 NOTE — Discharge Instructions (Signed)
[facility] Discharge Instructions   General Discharge Instructions  WEIGHT BEARING STATUS:weightbearing as tolerated   RANGE OF MOTION/ACTIVITY:No motion restrictions  Wound Care:  Incisions can be left open to air if there is no drainage. Once the incision is completely dry and without drainage, it may be left open to air out.  Showering may begin post op day 3, (06/13/23)  Clean incision gently with soap and water.  DVT/PE prophylaxis: Aspirin and Brilinta  Diet: as you were eating previously.  Can use over the counter stool softeners and bowel preparations, such as Miralax, to help with bowel movements.  Narcotics can be constipating.  Be sure to drink plenty of fluids  PAIN MEDICATION USE AND EXPECTATIONS  You have likely been given narcotic medications to help control your pain.  After a traumatic event that results in an fracture (broken bone) with or without surgery, it is ok to use narcotic pain medications to help control one's pain.  We understand that everyone responds to pain differently and each individual patient will be evaluated on a regular basis for the continued need for narcotic medications. Ideally, narcotic medication use should last no more than 6-8 weeks (coinciding with fracture healing).   As a patient it is your responsibility as well to monitor narcotic medication use and report the amount and frequency you use these medications when you come to your office visit.   We would also advise that if you are using narcotic medications, you should take a dose prior to therapy to maximize you participation.  IF YOU ARE ON NARCOTIC MEDICATIONS IT IS NOT PERMISSIBLE TO OPERATE A MOTOR VEHICLE (MOTORCYCLE/CAR/TRUCK/MOPED) OR HEAVY MACHINERY DO NOT MIX NARCOTICS WITH OTHER CNS (CENTRAL NERVOUS SYSTEM) DEPRESSANTS SUCH AS ALCOHOL   STOP SMOKING OR USING NICOTINE PRODUCTS!!!!  As discussed nicotine severely impairs your body's ability to heal surgical and  traumatic wounds but also impairs bone healing.  Wounds and bone heal by forming microscopic blood vessels (angiogenesis) and nicotine is a vasoconstrictor (essentially, shrinks blood vessels).  Therefore, if vasoconstriction occurs to these microscopic blood vessels they essentially disappear and are unable to deliver necessary nutrients to the healing tissue.  This is one modifiable factor that you can do to dramatically increase your chances of healing your injury.    (This means no smoking, no nicotine gum, patches, etc)  DO NOT USE NONSTEROIDAL ANTI-INFLAMMATORY DRUGS (NSAID'S)  Using products such as Advil (ibuprofen), Aleve (naproxen), Motrin (ibuprofen) for additional pain control during fracture healing can delay and/or prevent the healing response.  If you would like to take over the counter (OTC) medication, Tylenol (acetaminophen) is ok.  However, some narcotic medications that are given for pain control contain acetaminophen as well. Therefore, you should not exceed more than 4000 mg of tylenol in a day if you do not have liver disease.  Also note that there are may OTC medicines, such as cold medicines and allergy medicines that my contain tylenol as well.  If you have any questions about medications and/or interactions please ask your doctor/PA or your pharmacist.      ICE AND ELEVATE INJURED/OPERATIVE EXTREMITY  Using ice and elevating the injured extremity above your heart can help with swelling and pain control.  Icing in a pulsatile fashion, such as 20 minutes on and 20 minutes off, can be followed.    Do not place ice directly on skin. Make sure there is a barrier between to skin and the ice pack.  Using frozen items such as frozen peas works well as the conform nicely to the are that needs to be iced.  USE AN ACE WRAP OR TED HOSE FOR SWELLING CONTROL  In addition to icing and elevation, Ace wraps or TED hose are used to help limit and resolve swelling.  It is recommended to use  Ace wraps or TED hose until you are informed to stop.    When using Ace Wraps start the wrapping distally (farthest away from the body) and wrap proximally (closer to the body)   Example: If you had surgery on your leg or thing and you do not have a splint on, start the ace wrap at the toes and work your way up to the thigh        If you had surgery on your upper extremity and do not have a splint on, start the ace wrap at your fingers and work your way up to the upper arm   CALL THE OFFICE WITH ANY QUESTIONS OR CONCERNS: [phone]   VISIT OUR WEBSITE FOR ADDITIONAL INFORMATION: [email or web address]     Discharge Wound Care Instructions  Do NOT apply any ointments, solutions or lotions to pin sites or surgical wounds.  These prevent needed drainage and even though solutions like hydrogen peroxide kill bacteria, they also damage cells lining the pin sites that help fight infection.  Applying lotions or ointments can keep the wounds moist and can cause them to breakdown and open up as well. This can increase the risk for infection. When in doubt call the office.   If any drainage is noted, use foam dressing  Once the incision is completely dry and without drainage, it may be left open to air out.  Showering may begin 36-48 hours later.  Cleaning gently with soap and water.

## 2023-06-12 NOTE — Plan of Care (Signed)
  Problem: Activity: Goal: Ability to tolerate increased activity will improve Outcome: Adequate for Discharge   Problem: Respiratory: Goal: Ability to maintain a clear airway and adequate ventilation will improve Outcome: Adequate for Discharge   Problem: Role Relationship: Goal: Method of communication will improve Outcome: Adequate for Discharge   Problem: Education: Goal: Knowledge of disease or condition will improve Outcome: Adequate for Discharge Goal: Knowledge of secondary prevention will improve (MUST DOCUMENT ALL) Outcome: Adequate for Discharge Goal: Knowledge of patient specific risk factors will improve Loraine Leriche N/A or DELETE if not current risk factor) Outcome: Adequate for Discharge   Problem: Ischemic Stroke/TIA Tissue Perfusion: Goal: Complications of ischemic stroke/TIA will be minimized Outcome: Adequate for Discharge   Problem: Coping: Goal: Will verbalize positive feelings about self Outcome: Adequate for Discharge Goal: Will identify appropriate support needs Outcome: Adequate for Discharge   Problem: Health Behavior/Discharge Planning: Goal: Ability to manage health-related needs will improve Outcome: Adequate for Discharge Goal: Goals will be collaboratively established with patient/family Outcome: Adequate for Discharge   Problem: Self-Care: Goal: Ability to participate in self-care as condition permits will improve Outcome: Adequate for Discharge Goal: Verbalization of feelings and concerns over difficulty with self-care will improve Outcome: Adequate for Discharge Goal: Ability to communicate needs accurately will improve Outcome: Adequate for Discharge   Problem: Nutrition: Goal: Risk of aspiration will decrease Outcome: Adequate for Discharge Goal: Dietary intake will improve Outcome: Adequate for Discharge   Problem: Education: Goal: Knowledge of General Education information will improve Description: Including pain rating scale,  medication(s)/side effects and non-pharmacologic comfort measures Outcome: Adequate for Discharge   Problem: Health Behavior/Discharge Planning: Goal: Ability to manage health-related needs will improve Outcome: Adequate for Discharge   Problem: Clinical Measurements: Goal: Ability to maintain clinical measurements within normal limits will improve Outcome: Adequate for Discharge Goal: Will remain free from infection Outcome: Adequate for Discharge Goal: Diagnostic test results will improve Outcome: Adequate for Discharge Goal: Respiratory complications will improve Outcome: Adequate for Discharge Goal: Cardiovascular complication will be avoided Outcome: Adequate for Discharge   Problem: Activity: Goal: Risk for activity intolerance will decrease Outcome: Adequate for Discharge   Problem: Nutrition: Goal: Adequate nutrition will be maintained Outcome: Adequate for Discharge   Problem: Coping: Goal: Level of anxiety will decrease Outcome: Adequate for Discharge   Problem: Elimination: Goal: Will not experience complications related to bowel motility Outcome: Adequate for Discharge Goal: Will not experience complications related to urinary retention Outcome: Adequate for Discharge   Problem: Pain Management: Goal: General experience of comfort will improve Outcome: Adequate for Discharge   Problem: Safety: Goal: Ability to remain free from injury will improve Outcome: Adequate for Discharge   Problem: Skin Integrity: Goal: Risk for impaired skin integrity will decrease Outcome: Adequate for Discharge

## 2023-06-12 NOTE — Anesthesia Postprocedure Evaluation (Signed)
Anesthesia Post Note  Patient: Casey Rangel  Procedure(s) Performed: INTRAMEDULLARY (IM) NAIL INTERTROCHANTERIC (Right: Leg Upper)     Patient location during evaluation: Other Anesthesia Type: General Level of consciousness: awake and alert Pain management: pain level controlled Vital Signs Assessment: post-procedure vital signs reviewed and stable Respiratory status: spontaneous breathing, nonlabored ventilation, respiratory function stable and patient connected to nasal cannula oxygen Cardiovascular status: blood pressure returned to baseline and stable Postop Assessment: no apparent nausea or vomiting Anesthetic complications: no  No notable events documented.  Last Vitals:  Vitals:   06/12/23 0453 06/12/23 0743  BP: 133/64 133/72  Pulse: 60 66  Resp: 17 16  Temp:  (!) 36.4 C  SpO2: 98% 99%    Last Pain:  Vitals:   06/12/23 0743  TempSrc: Oral  PainSc:                  Chioma Mukherjee S

## 2023-06-16 ENCOUNTER — Ambulatory Visit (HOSPITAL_COMMUNITY)
Admission: RE | Admit: 2023-06-16 | Discharge: 2023-06-16 | Disposition: A | Discharge status: Home / Self Care | Payer: Medicaid Other | Source: Ambulatory Visit | Attending: Interventional Radiology | Admitting: Interventional Radiology

## 2023-08-07 ENCOUNTER — Other Ambulatory Visit: Payer: Self-pay

## 2023-08-07 NOTE — Patient Outreach (Signed)
Telephone outreach to patient's sister to obtain mRS was successfully completed. MRS= 5  Mentioned pt will be in facility for a while.  Vanice Sarah Care Management Assistant [phone]

## 2023-08-13 ENCOUNTER — Ambulatory Visit (HOSPITAL_COMMUNITY)
Admission: RE | Admit: 2023-08-13 | Discharge: 2023-08-13 | Disposition: A | Discharge status: Home / Self Care | Payer: Self-pay | Source: Ambulatory Visit | Attending: Neurosurgery | Admitting: Neurosurgery

## 2023-08-13 DIAGNOSIS — S065XAA Traumatic subdural hemorrhage with loss of consciousness status unknown, initial encounter: Secondary | ICD-10-CM | POA: Insufficient documentation

## 2023-08-18 ENCOUNTER — Encounter (HOSPITAL_COMMUNITY): Payer: Self-pay

## 2023-08-18 ENCOUNTER — Other Ambulatory Visit: Payer: Self-pay

## 2023-08-18 ENCOUNTER — Emergency Department (HOSPITAL_COMMUNITY): Payer: Self-pay

## 2023-08-18 ENCOUNTER — Emergency Department (HOSPITAL_COMMUNITY)
Admission: EM | Admit: 2023-08-18 | Discharge: 2023-08-18 | Disposition: A | Discharge status: Home / Self Care | Payer: Self-pay | Attending: Emergency Medicine | Admitting: Emergency Medicine

## 2023-08-18 DIAGNOSIS — Z7982 Long term (current) use of aspirin: Secondary | ICD-10-CM | POA: Insufficient documentation

## 2023-08-18 DIAGNOSIS — F0390 Unspecified dementia without behavioral disturbance: Secondary | ICD-10-CM | POA: Insufficient documentation

## 2023-08-18 DIAGNOSIS — K9423 Gastrostomy malfunction: Secondary | ICD-10-CM | POA: Insufficient documentation

## 2023-08-18 DIAGNOSIS — Z931 Gastrostomy status: Secondary | ICD-10-CM

## 2023-08-18 MED ORDER — DIATRIZOATE MEGLUMINE & SODIUM 66-10 % PO SOLN
30.0000 mL | Freq: Once | ORAL | Status: AC
  Administered 2023-08-18: 30 mL
  Filled 2023-08-18: qty 30

## 2023-08-18 NOTE — ED Notes (Signed)
Report called in to [facility] at [facility] [employer]. Ready to receive PT.

## 2023-08-18 NOTE — ED Notes (Signed)
Pt nonverbal at baseline

## 2023-08-18 NOTE — ED Notes (Signed)
Ptar called, no eta 

## 2023-08-18 NOTE — Discharge Instructions (Addendum)
 Casey Rangel was seen in the emergency department today for concerns of his G-tube.  The tube appeared to be leaking so this was replaced.  X-ray imaging confirmed placement.  The tubing is functioning and intact.  Please return the emergency department if there are any other concerns.

## 2023-08-18 NOTE — ED Triage Notes (Signed)
Pt from [facility]. Pt has hx of dementia. Pt pulled out gtube last night. VSS.

## 2023-08-18 NOTE — ED Provider Notes (Signed)
 [place] EMERGENCY DEPARTMENT AT [facility] Provider Note   CSN: [id number] Arrival date & time: 08/18/23  1009     History Chief Complaint  Patient presents with   G- Tube issue     Casey Rangel is a 72 y.o. male.  Patient presents the emergency department from [facility] with concerns of a dislodged G-tube.  Patient has history of dementia and reportedly pulled the tube out last night.  Was sent by nursing staff for evaluation of the G-tube and possible placement.  No other acute concerns although patient is unable to verbalize any concerns.  HPI     Home Medications Prior to Admission medications   Medication Sig Start Date End Date Taking? Authorizing Provider  acetaminophen  (TYLENOL ) 160 MG/5ML solution Place 20.3 mLs (650 mg total) into feeding tube every 6 (six) hours as needed for mild pain (pain score 1-3), headache or fever. 06/12/23   Danton Reyes DASEN, MD  aspirin  81 MG chewable tablet Place 1 tablet (81 mg total) into feeding tube daily. 05/03/23   Lehner, Erin C, NP  carvedilol  (COREG ) 3.125 MG tablet Take 1 tablet (3.125 mg total) by mouth 2 (two) times daily with a meal. 01/21/22   de Clint Kill, Cortney E, NP  divalproex (DEPAKOTE SPRINKLE) 125 MG capsule Take 125 mg by mouth daily.    [provider]  donepezil  (ARICEPT ) 10 MG tablet Place 1 tablet (10 mg total) into feeding tube at bedtime. 06/12/23   Danton Reyes DASEN, MD  HYDROcodone -acetaminophen  (NORCO/VICODIN) 5-325 MG tablet Place 1-2 tablets into feeding tube every 6 (six) hours as needed for moderate pain (pain score 4-6) or severe pain (pain score 7-10). 06/12/23   Danton Reyes DASEN, MD  hydrOXYzine (ATARAX) 25 MG tablet Take 25 mg by mouth every 8 (eight) hours as needed (agitation).    [provider]  Nutritional Supplements (FEEDING SUPPLEMENT, OSMOLITE 1.5 CAL,) LIQD Place 1,000 mLs into feeding tube continuous. 06/12/23   Danton Reyes DASEN, MD  Protein  (FEEDING SUPPLEMENT, PROSOURCE TF20,) liquid Place 60 mLs into feeding tube daily. 06/13/23   Danton Reyes DASEN, MD  QUEtiapine  (SEROQUEL ) 25 MG tablet Place 0.5 tablets (12.5 mg total) into feeding tube 2 (two) times daily. 05/02/23   Judithe Rocky BROCKS, NP  rosuvastatin  (CRESTOR ) 20 MG tablet Place 1 tablet (20 mg total) into feeding tube daily. 05/03/23   Judithe Rocky BROCKS, NP  Vitamin D , Cholecalciferol , 25 MCG (1000 UT) CAPS Take 1,000 Units by mouth daily. 06/12/23   Danton Lauraine LABOR, PA-C  Water  For Irrigation, Sterile (FREE WATER ) SOLN Place 150 mLs into feeding tube every 4 (four) hours. 06/12/23   Danton Reyes DASEN, MD      Allergies    Penicillins    Review of Systems   Review of Systems  Gastrointestinal:        G-tube concerns  All other systems reviewed and are negative.   Physical Exam Updated Vital Signs BP 137/76 (BP Location: Left Arm)   Pulse 74   Temp 97.9 F (36.6 C)   Resp 16   SpO2 100%  Physical Exam Vitals and nursing note reviewed.  Constitutional:      General: He is not in acute distress.    Appearance: He is well-developed.  HENT:     Head: Normocephalic and atraumatic.  Eyes:     Conjunctiva/sclera: Conjunctivae normal.  Cardiovascular:     Rate and Rhythm: Normal rate and regular rhythm.  Heart sounds: No murmur heard. Pulmonary:     Effort: Pulmonary effort is normal. No respiratory distress.     Breath sounds: Normal breath sounds.  Abdominal:     Palpations: Abdomen is soft.     Tenderness: There is no abdominal tenderness.     Comments: G-tube intact and not removed, but connections appear damaged and are leaking  Musculoskeletal:        General: No swelling.     Cervical back: Neck supple.  Skin:    General: Skin is warm and dry.     Capillary Refill: Capillary refill takes less than 2 seconds.  Neurological:     Mental Status: He is alert.  Psychiatric:        Mood and Affect: Mood normal.     ED Results / Procedures /  Treatments   Labs (all labs ordered are listed, but only abnormal results are displayed) Labs Reviewed - No data to display  EKG None  Radiology DG ABDOMEN PEG TUBE LOCATION Result Date: 08/18/2023 CLINICAL DATA:  Peg tube malfunction EXAM: ABDOMEN - 1 VIEW COMPARISON:  05/20/2023 FINDINGS: Frontal view of the lower chest and upper abdomen demonstrates percutaneous gastrostomy tube within the gastric antrum. Injected contrast is seen outlining the gastric rugal folds and proximal small bowel. No evidence of contrast extravasation. IMPRESSION: 1. Percutaneous gastrostomy tube within the lumen of the gastric antrum. No contrast extravasation. Electronically Signed   By: Ozell Daring M.D.   On: 08/18/2023 18:47    Procedures .Gastrostomy tube replacement  Date/Time: 08/18/2023 7:06 PM  Performed by: Owynn Mosqueda A, PA-C Authorized by: Alashia Brownfield A, PA-C  Consent: Verbal consent obtained. Written consent not obtained. Risks and benefits: risks, benefits and alternatives were discussed Consent given by: power of attorney Patient identity confirmed: arm band Preparation: Patient was prepped and draped in the usual sterile fashion. Local anesthesia used: no  Anesthesia: Local anesthesia used: no  Sedation: Patient sedated: no  Patient tolerance: patient tolerated the procedure well with no immediate complications Comments: Replaced with 22 Fr      Medications Ordered in ED Medications  diatrizoate  meglumine -sodium (GASTROGRAFIN ) 66-10 % solution 30 mL (30 mLs Per Tube Given 08/18/23 1633)    ED Course/ Medical Decision Making/ A&P                                 Medical Decision Making Amount and/or Complexity of Data Reviewed Radiology: ordered.  Risk Prescription drug management.   This patient presents to the ED for concern of G-tube concerns.  Differential diagnosis includes damage gastrojejunal tube, bowel obstruction, constipation   Imaging Studies  ordered:  I ordered imaging studies including xray abdomen PEG tube location  I independently visualized and interpreted imaging which showed Percutaneous gastrostomy tube within the lumen of the gastric antrum. No contrast extravasation. I agree with the radiologist interpretation   Problem List / ED Course:  Patient presents to the emergency department concerns of G-tube malfunction.  Patient has a history of dementia and is able to tell me exactly what occurred.  Does not appear to be acutely uncomfortable on palpation to the abdomen so doubt acute abdominal concerns.  Patient reportedly pulled the tube out last night.  On examination, the tube appears to be intact and balloon is still placed with inability to retract the tubing so I doubt the tube was pulled out.  However, it appears that one  of the ends of the connections on the tube is broken and is leaking.  Will require replacement of this G-tube. G-tube replaced without any difficulty.  Inserted easily without any resistance.  Will obtain an x-ray of the abdomen for PEG tube location. X-ray reassuring without any evidence of contrast extravasation.  Will discharge patient home.  No other acute or focal concerns.  Patient in stable condition. Informed patient's POA, Heron, regarding care. No other concerns. Will coordinate PTAR for discharge back to [facility].  Final Clinical Impression(s) / ED Diagnoses Final diagnoses:  Gastrojejunal (GJ) tube in place [facility])    Rx / DC Orders ED Discharge Orders     None         Cecily Legrand LABOR, PA-C 08/18/23 1912    Pamella Ozell LABOR, DO 08/19/23 [phone]

## 2023-08-24 ENCOUNTER — Other Ambulatory Visit (HOSPITAL_COMMUNITY): Payer: Self-pay | Admitting: Interventional Radiology

## 2023-08-24 DIAGNOSIS — S065XAA Traumatic subdural hemorrhage with loss of consciousness status unknown, initial encounter: Secondary | ICD-10-CM

## 2023-08-25 ENCOUNTER — Telehealth (HOSPITAL_COMMUNITY): Payer: Self-pay

## 2023-08-25 ENCOUNTER — Other Ambulatory Visit: Payer: Self-pay | Admitting: Student

## 2023-08-25 NOTE — Telephone Encounter (Signed)
 Called [facility] to inform RN of below information. She gave me her fax number so that I could fax information. AB

## 2023-08-25 NOTE — Telephone Encounter (Signed)
-----   Message from Toya VEAR Cousin sent at 08/25/2023 10:23 AM EST ----- Regarding: RE: follow up Jenn, I got it   Ash, D reviewed CT and his brain appears  stable, he recommends continue aspirin  81 mg daily.  Patient does not need to follow up with us , if he has any issues or problem I recommend that he reach out to his PCP or  neurosurgeon.   Thanks! Aimee ----- Message ----- From: Lucila Delon BROCKS, NP Sent: 08/24/2023   5:28 PM EST To: Toya VEAR Cousin, PA-C Subject: FW: follow up                                  Aimee,  Can you review this with D tomorrow. I did not get to it today. Thank you ----- Message ----- From: Carolee Rosina BIRCH Sent: 08/24/2023   1:00 PM EST To: Delon BROCKS Lucila, NP Subject: follow up                                      Delon,   Mr. Corsetti had his CT head done on 12/26. Can you please have Dev review?  Thanks,  Erwin

## 2023-09-06 ENCOUNTER — Emergency Department (HOSPITAL_COMMUNITY): Payer: Medicare Other

## 2023-09-06 ENCOUNTER — Other Ambulatory Visit: Payer: Self-pay

## 2023-09-06 ENCOUNTER — Inpatient Hospital Stay (HOSPITAL_COMMUNITY): Payer: Medicare Other

## 2023-09-06 ENCOUNTER — Inpatient Hospital Stay (HOSPITAL_COMMUNITY)
Admission: EM | Admit: 2023-09-06 | Discharge: 2023-09-19 | DRG: 871 | Disposition: E | Payer: Medicare Other | Source: Skilled Nursing Facility | Attending: Internal Medicine | Admitting: Internal Medicine

## 2023-09-06 DIAGNOSIS — Z931 Gastrostomy status: Secondary | ICD-10-CM | POA: Diagnosis not present

## 2023-09-06 DIAGNOSIS — E870 Hyperosmolality and hypernatremia: Secondary | ICD-10-CM | POA: Diagnosis present

## 2023-09-06 DIAGNOSIS — J9601 Acute respiratory failure with hypoxia: Principal | ICD-10-CM | POA: Diagnosis present

## 2023-09-06 DIAGNOSIS — J440 Chronic obstructive pulmonary disease with acute lower respiratory infection: Secondary | ICD-10-CM | POA: Diagnosis present

## 2023-09-06 DIAGNOSIS — F0150 Vascular dementia without behavioral disturbance: Secondary | ICD-10-CM | POA: Diagnosis present

## 2023-09-06 DIAGNOSIS — Z87891 Personal history of nicotine dependence: Secondary | ICD-10-CM | POA: Diagnosis not present

## 2023-09-06 DIAGNOSIS — J189 Pneumonia, unspecified organism: Secondary | ICD-10-CM | POA: Diagnosis present

## 2023-09-06 DIAGNOSIS — K219 Gastro-esophageal reflux disease without esophagitis: Secondary | ICD-10-CM | POA: Diagnosis present

## 2023-09-06 DIAGNOSIS — N170 Acute kidney failure with tubular necrosis: Secondary | ICD-10-CM | POA: Diagnosis present

## 2023-09-06 DIAGNOSIS — E861 Hypovolemia: Secondary | ICD-10-CM | POA: Diagnosis present

## 2023-09-06 DIAGNOSIS — I10 Essential (primary) hypertension: Secondary | ICD-10-CM | POA: Diagnosis present

## 2023-09-06 DIAGNOSIS — R7401 Elevation of levels of liver transaminase levels: Secondary | ICD-10-CM

## 2023-09-06 DIAGNOSIS — Z515 Encounter for palliative care: Secondary | ICD-10-CM | POA: Diagnosis not present

## 2023-09-06 DIAGNOSIS — Z822 Family history of deafness and hearing loss: Secondary | ICD-10-CM

## 2023-09-06 DIAGNOSIS — E785 Hyperlipidemia, unspecified: Secondary | ICD-10-CM | POA: Diagnosis present

## 2023-09-06 DIAGNOSIS — R4182 Altered mental status, unspecified: Secondary | ICD-10-CM

## 2023-09-06 DIAGNOSIS — K7200 Acute and subacute hepatic failure without coma: Secondary | ICD-10-CM | POA: Diagnosis present

## 2023-09-06 DIAGNOSIS — I6200 Nontraumatic subdural hemorrhage, unspecified: Secondary | ICD-10-CM | POA: Diagnosis present

## 2023-09-06 DIAGNOSIS — E43 Unspecified severe protein-calorie malnutrition: Secondary | ICD-10-CM | POA: Diagnosis present

## 2023-09-06 DIAGNOSIS — A419 Sepsis, unspecified organism: Principal | ICD-10-CM | POA: Diagnosis present

## 2023-09-06 DIAGNOSIS — R6521 Severe sepsis with septic shock: Secondary | ICD-10-CM | POA: Diagnosis present

## 2023-09-06 DIAGNOSIS — R54 Age-related physical debility: Secondary | ICD-10-CM | POA: Diagnosis present

## 2023-09-06 DIAGNOSIS — I69320 Aphasia following cerebral infarction: Secondary | ICD-10-CM

## 2023-09-06 DIAGNOSIS — F32A Depression, unspecified: Secondary | ICD-10-CM | POA: Diagnosis present

## 2023-09-06 DIAGNOSIS — Z79899 Other long term (current) drug therapy: Secondary | ICD-10-CM

## 2023-09-06 DIAGNOSIS — M810 Age-related osteoporosis without current pathological fracture: Secondary | ICD-10-CM | POA: Diagnosis present

## 2023-09-06 DIAGNOSIS — E872 Acidosis, unspecified: Secondary | ICD-10-CM | POA: Diagnosis present

## 2023-09-06 DIAGNOSIS — R1310 Dysphagia, unspecified: Secondary | ICD-10-CM | POA: Diagnosis present

## 2023-09-06 DIAGNOSIS — I69391 Dysphagia following cerebral infarction: Secondary | ICD-10-CM

## 2023-09-06 DIAGNOSIS — Z6820 Body mass index (BMI) 20.0-20.9, adult: Secondary | ICD-10-CM

## 2023-09-06 DIAGNOSIS — Z66 Do not resuscitate: Secondary | ICD-10-CM | POA: Diagnosis present

## 2023-09-06 DIAGNOSIS — D72829 Elevated white blood cell count, unspecified: Secondary | ICD-10-CM | POA: Diagnosis present

## 2023-09-06 DIAGNOSIS — Z880 Allergy status to penicillin: Secondary | ICD-10-CM

## 2023-09-06 DIAGNOSIS — G9341 Metabolic encephalopathy: Secondary | ICD-10-CM | POA: Diagnosis present

## 2023-09-06 DIAGNOSIS — E860 Dehydration: Secondary | ICD-10-CM | POA: Diagnosis present

## 2023-09-06 DIAGNOSIS — Z7982 Long term (current) use of aspirin: Secondary | ICD-10-CM

## 2023-09-06 LAB — I-STAT VENOUS BLOOD GAS, ED
Acid-Base Excess: 0 mmol/L (ref 0.0–2.0)
Bicarbonate: 26.3 mmol/L (ref 20.0–28.0)
Calcium, Ion: 1.1 mmol/L — ABNORMAL LOW (ref 1.15–1.40)
HCT: 43 % (ref 39.0–52.0)
Hemoglobin: 14.6 g/dL (ref 13.0–17.0)
O2 Saturation: 64 %
Potassium: 4.6 mmol/L (ref 3.5–5.1)
Sodium: 163 mmol/L (ref 135–145)
TCO2: 28 mmol/L (ref 22–32)
pCO2, Ven: 49.5 mm[Hg] (ref 44–60)
pH, Ven: 7.333 (ref 7.25–7.43)
pO2, Ven: 36 mm[Hg] (ref 32–45)

## 2023-09-06 LAB — COMPREHENSIVE METABOLIC PANEL
ALT: 353 U/L — ABNORMAL HIGH (ref 0–44)
AST: 331 U/L — ABNORMAL HIGH (ref 15–41)
Albumin: 2.4 g/dL — ABNORMAL LOW (ref 3.5–5.0)
Alkaline Phosphatase: 110 U/L (ref 38–126)
Anion gap: 11 (ref 5–15)
BUN: 62 mg/dL — ABNORMAL HIGH (ref 8–23)
CO2: 23 mmol/L (ref 22–32)
Calcium: 8 mg/dL — ABNORMAL LOW (ref 8.9–10.3)
Chloride: 126 mmol/L — ABNORMAL HIGH (ref 98–111)
Creatinine, Ser: 1.21 mg/dL (ref 0.61–1.24)
GFR, Estimated: >60 mL/min (ref >60)
Glucose, Bld: 164 mg/dL — ABNORMAL HIGH (ref 70–99)
Potassium: 5.1 mmol/L (ref 3.5–5.1)
Sodium: 160 mmol/L — ABNORMAL HIGH (ref 135–145)
Total Bilirubin: 1.1 mg/dL (ref 0.0–1.2)
Total Protein: 7.2 g/dL (ref 6.5–8.1)

## 2023-09-06 LAB — POCT I-STAT 7, (LYTES, BLD GAS, ICA,H+H)
Acid-base deficit: 2 mmol/L (ref 0.0–2.0)
Bicarbonate: 22.4 mmol/L (ref 20.0–28.0)
Calcium, Ion: 1.16 mmol/L (ref 1.15–1.40)
HCT: 29 % — ABNORMAL LOW (ref 39.0–52.0)
Hemoglobin: 9.9 g/dL — ABNORMAL LOW (ref 13.0–17.0)
O2 Saturation: 94 %
Patient temperature: 99.3
Potassium: 4 mmol/L (ref 3.5–5.1)
Sodium: 159 mmol/L — ABNORMAL HIGH (ref 135–145)
TCO2: 23 mmol/L (ref 22–32)
pCO2 arterial: 37.7 mm[Hg] (ref 32–48)
pH, Arterial: 7.383 (ref 7.35–7.45)
pO2, Arterial: 73 mm[Hg] — ABNORMAL LOW (ref 83–108)

## 2023-09-06 LAB — I-STAT ARTERIAL BLOOD GAS, ED
Acid-base deficit: 2 mmol/L (ref 0.0–2.0)
Bicarbonate: 24.2 mmol/L (ref 20.0–28.0)
Calcium, Ion: 1.12 mmol/L — ABNORMAL LOW (ref 1.15–1.40)
HCT: 35 % — ABNORMAL LOW (ref 39.0–52.0)
Hemoglobin: 11.9 g/dL — ABNORMAL LOW (ref 13.0–17.0)
O2 Saturation: 96 %
Patient temperature: 102.8
Potassium: 4.5 mmol/L (ref 3.5–5.1)
Sodium: 163 mmol/L (ref 135–145)
TCO2: 26 mmol/L (ref 22–32)
pCO2 arterial: 50.4 mm[Hg] — ABNORMAL HIGH (ref 32–48)
pH, Arterial: 7.3 — ABNORMAL LOW (ref 7.35–7.45)
pO2, Arterial: 102 mm[Hg] (ref 83–108)

## 2023-09-06 LAB — CBC WITH DIFFERENTIAL/PLATELET
Abs Immature Granulocytes: 0.2 10*3/uL — ABNORMAL HIGH (ref 0.00–0.07)
Basophils Absolute: 0.1 10*3/uL (ref 0.0–0.1)
Basophils Relative: 1 %
Eosinophils Absolute: 0 10*3/uL (ref 0.0–0.5)
Eosinophils Relative: 0 %
HCT: 42.1 % (ref 39.0–52.0)
Hemoglobin: 13.1 g/dL (ref 13.0–17.0)
Immature Granulocytes: 1 %
Lymphocytes Relative: 5 %
Lymphs Abs: 1.1 10*3/uL (ref 0.7–4.0)
MCH: 29.6 pg (ref 26.0–34.0)
MCHC: 31.1 g/dL (ref 30.0–36.0)
MCV: 95.2 fL (ref 80.0–100.0)
Monocytes Absolute: 1 10*3/uL (ref 0.1–1.0)
Monocytes Relative: 4 %
Neutro Abs: 21.3 10*3/uL — ABNORMAL HIGH (ref 1.7–7.7)
Neutrophils Relative %: 89 %
Platelets: 448 10*3/uL — ABNORMAL HIGH (ref 150–400)
RBC: 4.42 MIL/uL (ref 4.22–5.81)
RDW: 16.3 % — ABNORMAL HIGH (ref 11.5–15.5)
WBC: 23.7 10*3/uL — ABNORMAL HIGH (ref 4.0–10.5)
nRBC: 0 % (ref 0.0–0.2)

## 2023-09-06 LAB — BASIC METABOLIC PANEL
Anion gap: 17 — ABNORMAL HIGH (ref 5–15)
BUN: 63 mg/dL — ABNORMAL HIGH (ref 8–23)
CO2: 14 mmol/L — ABNORMAL LOW (ref 22–32)
Calcium: 7.2 mg/dL — ABNORMAL LOW (ref 8.9–10.3)
Chloride: 125 mmol/L — ABNORMAL HIGH (ref 98–111)
Creatinine, Ser: 1.09 mg/dL (ref 0.61–1.24)
GFR, Estimated: >60 mL/min (ref >60)
Glucose, Bld: 111 mg/dL — ABNORMAL HIGH (ref 70–99)
Potassium: 5.1 mmol/L (ref 3.5–5.1)
Sodium: 156 mmol/L — ABNORMAL HIGH (ref 135–145)

## 2023-09-06 LAB — MRSA NEXT GEN BY PCR, NASAL: MRSA by PCR Next Gen: DETECTED — AB

## 2023-09-06 LAB — GLUCOSE, CAPILLARY
Glucose-Capillary: 104 mg/dL — ABNORMAL HIGH (ref 70–99)
Glucose-Capillary: 98 mg/dL (ref 70–99)
Glucose-Capillary: 64 mg/dL — ABNORMAL LOW (ref 70–99)

## 2023-09-06 LAB — PROTIME-INR
INR: 1.3 — ABNORMAL HIGH (ref 0.8–1.2)
Prothrombin Time: 16.6 s — ABNORMAL HIGH (ref 11.4–15.2)

## 2023-09-06 LAB — RESP PANEL BY RT-PCR (RSV, FLU A&B, COVID)  RVPGX2
Influenza A by PCR: NEGATIVE
Influenza B by PCR: NEGATIVE
Resp Syncytial Virus by PCR: NEGATIVE
SARS Coronavirus 2 by RT PCR: NEGATIVE

## 2023-09-06 LAB — I-STAT CG4 LACTIC ACID, ED: Lactic Acid, Venous: 2.5 mmol/L (ref 0.5–1.9)

## 2023-09-06 LAB — APTT: aPTT: 27 s (ref 24–36)

## 2023-09-06 MED ORDER — ACETAMINOPHEN 650 MG RE SUPP
RECTAL | Status: AC
  Filled 2023-09-06: qty 1

## 2023-09-06 MED ORDER — ENOXAPARIN SODIUM 40 MG/0.4ML IJ SOSY
40.0000 mg | PREFILLED_SYRINGE | INTRAMUSCULAR | Status: DC
  Administered 2023-09-06 – 2023-09-07 (×2): 40 mg via SUBCUTANEOUS
  Filled 2023-09-06 (×2): qty 0.4

## 2023-09-06 MED ORDER — LACTATED RINGERS IV BOLUS (SEPSIS)
1000.0000 mL | Freq: Once | INTRAVENOUS | Status: AC
  Administered 2023-09-06: 1000 mL via INTRAVENOUS

## 2023-09-06 MED ORDER — SUCCINYLCHOLINE CHLORIDE 20 MG/ML IJ SOLN
INTRAMUSCULAR | Status: AC | PRN
  Administered 2023-09-06: 100 mg via INTRAVENOUS

## 2023-09-06 MED ORDER — PANTOPRAZOLE SODIUM 40 MG IV SOLR
40.0000 mg | INTRAVENOUS | Status: DC
  Administered 2023-09-06 – 2023-09-07 (×2): 40 mg via INTRAVENOUS
  Filled 2023-09-06 (×2): qty 10

## 2023-09-06 MED ORDER — FENTANYL BOLUS VIA INFUSION
25.0000 ug | INTRAVENOUS | Status: DC | PRN
  Administered 2023-09-07 (×5): 50 ug via INTRAVENOUS
  Administered 2023-09-07 (×4): 100 ug via INTRAVENOUS
  Administered 2023-09-07: 50 ug via INTRAVENOUS
  Administered 2023-09-07 – 2023-09-08 (×7): 100 ug via INTRAVENOUS

## 2023-09-06 MED ORDER — LACTATED RINGERS IV BOLUS
1000.0000 mL | Freq: Once | INTRAVENOUS | Status: AC
  Administered 2023-09-06: 1000 mL via INTRAVENOUS

## 2023-09-06 MED ORDER — NOREPINEPHRINE 4 MG/250ML-% IV SOLN
2.0000 ug/min | INTRAVENOUS | Status: DC
  Administered 2023-09-06: 2 ug/min via INTRAVENOUS
  Administered 2023-09-06: 8 ug/min via INTRAVENOUS
  Administered 2023-09-06: 6 ug/min via INTRAVENOUS
  Administered 2023-09-07: 10 ug/min via INTRAVENOUS
  Administered 2023-09-07: 9 ug/min via INTRAVENOUS
  Administered 2023-09-07 – 2023-09-08 (×2): 7 ug/min via INTRAVENOUS
  Filled 2023-09-06 (×5): qty 250

## 2023-09-06 MED ORDER — LACTATED RINGERS IV SOLN: INTRAVENOUS | Status: AC

## 2023-09-06 MED ORDER — METRONIDAZOLE 500 MG/100ML IV SOLN
500.0000 mg | Freq: Once | INTRAVENOUS | Status: DC
  Filled 2023-09-06: qty 100

## 2023-09-06 MED ORDER — DEXTROSE 50 % IV SOLN
INTRAVENOUS | Status: AC
  Administered 2023-09-06: 12.5 g via INTRAVENOUS
  Filled 2023-09-06: qty 50

## 2023-09-06 MED ORDER — SODIUM CHLORIDE 0.9 % IV SOLN
2.0000 g | Freq: Once | INTRAVENOUS | Status: AC
  Administered 2023-09-06: 2 g via INTRAVENOUS
  Filled 2023-09-06: qty 12.5

## 2023-09-06 MED ORDER — SODIUM CHLORIDE 0.9 % IV SOLN
500.0000 mg | INTRAVENOUS | Status: DC
  Administered 2023-09-06 – 2023-09-07 (×2): 500 mg via INTRAVENOUS
  Filled 2023-09-06 (×2): qty 5

## 2023-09-06 MED ORDER — FREE WATER
150.0000 mL | Status: DC
  Administered 2023-09-06 – 2023-09-07 (×9): 150 mL

## 2023-09-06 MED ORDER — FENTANYL 2500MCG IN NS 250ML (10MCG/ML) PREMIX INFUSION
25.0000 ug/h | INTRAVENOUS | Status: DC
Start: 2023-09-06 — End: 2023-09-08
  Administered 2023-09-06: 50 ug/h via INTRAVENOUS
  Administered 2023-09-07: 100 ug/h via INTRAVENOUS
  Administered 2023-09-08: 150 ug/h via INTRAVENOUS
  Filled 2023-09-06 (×3): qty 250

## 2023-09-06 MED ORDER — DEXTROSE 50 % IV SOLN: 12.5000 g | Freq: Once | INTRAVENOUS | Status: AC

## 2023-09-06 MED ORDER — VANCOMYCIN HCL IN DEXTROSE 1-5 GM/200ML-% IV SOLN: 1000.0000 mg | Freq: Once | INTRAVENOUS | Status: DC

## 2023-09-06 MED ORDER — PROPOFOL 1000 MG/100ML IV EMUL
0.0000 ug/kg/min | INTRAVENOUS | Status: DC
Start: 2023-09-06 — End: 2023-09-08
  Administered 2023-09-06 – 2023-09-07 (×3): 20 ug/kg/min via INTRAVENOUS
  Filled 2023-09-06 (×3): qty 100

## 2023-09-06 MED ORDER — ETOMIDATE 2 MG/ML IV SOLN
INTRAVENOUS | Status: AC | PRN
  Administered 2023-09-06: 20 mg via INTRAVENOUS

## 2023-09-06 MED ORDER — SODIUM CHLORIDE 0.9 % IV SOLN: 250.0000 mL | INTRAVENOUS | Status: AC

## 2023-09-06 MED ORDER — NOREPINEPHRINE 4 MG/250ML-% IV SOLN
0.0000 ug/min | INTRAVENOUS | Status: DC
  Administered 2023-09-06: 2 ug/min via INTRAVENOUS

## 2023-09-06 MED ORDER — SODIUM CHLORIDE 0.9 % IV SOLN
1.0000 g | INTRAVENOUS | Status: DC
  Administered 2023-09-06 – 2023-09-07 (×2): 1 g via INTRAVENOUS
  Filled 2023-09-06 (×2): qty 10

## 2023-09-06 NOTE — ED Notes (Signed)
Pt's temp was 102.8 rectal. MD made aware. Tylenol suppository given.

## 2023-09-06 NOTE — Code Documentation (Signed)
Intubation complete.

## 2023-09-06 NOTE — ED Triage Notes (Signed)
Pt BIB [facility] from [facility] d/t respiratory distress and hypotension. Per staff, pt became SOB all of the sudden. Pt's baseline is alert but confused. Pt's sat was still in the 70s on NRB. EMS gave fluids. Pt's initial palpated pressure was 68. Pt has a DNR form.

## 2023-09-06 NOTE — ED Provider Notes (Signed)
[facility] AT [facility] Provider Note   CSN: [id number] Arrival date & time: 09/06/23  1231     History {Add pertinent medical, surgical, social history, OB history to HPI:1} Chief Complaint  Patient presents with   Respiratory Distress   Hypotension    Casey Rangel is a 73 y.o. male with PMH as listed below who presents with respiratory distress, hypotension, altered mental status.     Past Medical History:  Diagnosis Date   GERD (gastroesophageal reflux disease)    Hiatal hernia    Stroke (HCC)        Home Medications Prior to Admission medications   Medication Sig Start Date End Date Taking? Authorizing Provider  acetaminophen (TYLENOL) 160 MG/5ML solution Place 20.3 mLs (650 mg total) into feeding tube every 6 (six) hours as needed for mild pain (pain score 1-3), headache or fever. 06/12/23   Lonia Blood, MD  aspirin 81 MG chewable tablet Place 1 tablet (81 mg total) into feeding tube daily. 05/03/23   Lynnae January, NP  carvedilol (COREG) 3.125 MG tablet Take 1 tablet (3.125 mg total) by mouth 2 (two) times daily with a meal. 01/21/22   de Saintclair Halsted, Cortney E, NP  divalproex (DEPAKOTE SPRINKLE) 125 MG capsule Take 125 mg by mouth daily.    [provider]  donepezil (ARICEPT) 10 MG tablet Place 1 tablet (10 mg total) into feeding tube at bedtime. 06/12/23   Lonia Blood, MD  HYDROcodone-acetaminophen (NORCO/VICODIN) 5-325 MG tablet Place 1-2 tablets into feeding tube every 6 (six) hours as needed for moderate pain (pain score 4-6) or severe pain (pain score 7-10). 06/12/23   Lonia Blood, MD  hydrOXYzine (ATARAX) 25 MG tablet Take 25 mg by mouth every 8 (eight) hours as needed (agitation).    [provider]  Nutritional Supplements (FEEDING SUPPLEMENT, OSMOLITE 1.5 CAL,) LIQD Place 1,000 mLs into feeding tube continuous. 06/12/23   Lonia Blood, MD  Protein (FEEDING SUPPLEMENT, PROSOURCE TF20,)  liquid Place 60 mLs into feeding tube daily. 06/13/23   Lonia Blood, MD  QUEtiapine (SEROQUEL) 25 MG tablet Place 0.5 tablets (12.5 mg total) into feeding tube 2 (two) times daily. 05/02/23   Lynnae January, NP  rosuvastatin (CRESTOR) 20 MG tablet Place 1 tablet (20 mg total) into feeding tube daily. 05/03/23   Lynnae January, NP  Vitamin D, Cholecalciferol, 25 MCG (1000 UT) CAPS Take 1,000 Units by mouth daily. 06/12/23   [place] Bali, PA-C  Water For Irrigation, Sterile (FREE WATER) SOLN Place 150 mLs into feeding tube every 4 (four) hours. 06/12/23   Lonia Blood, MD      Allergies    Penicillins    Review of Systems   Review of Systems A 10 point review of systems was performed and is negative unless otherwise reported in HPI.  Physical Exam Updated Vital Signs BP 107/63   Pulse 92   Temp 98.2 F (36.8 C) (Oral)   Resp (!) 21   Ht 5\' 8"  (1.727 m)   Wt 61 kg   SpO2 96%   BMI 20.45 kg/m  Physical Exam General: Acutely ill-appearing elderly male, lying in bed.  HEENT: Sclera anicteric, Extremely dry mucous membranes, trachea midline.  Cardiology: regular tachycardic rate/rhythm, no murmurs/rubs/gallops. BL radial and DP pulses equal bilaterally.  Resp: Acute respiratory distress, breathing 40-50 times per minute, satting 94% on NRB 15 L/min O2. Coarse lung sounds bilaterally with poor air  movement.  Abd: Soft, non-tender, non-distended. No rebound tenderness or guarding.  GU: Deferred. MSK: No peripheral edema or signs of trauma. Extremities without deformity or TTP. No cyanosis or clubbing. Skin: warm, dry.  Neuro: Somnolent, intermittently responding to voice, localizes pain. CNs II-XII grossly intact. MAEs. Sensation grossly intact.   ED Results / Procedures / Treatments   Labs (all labs ordered are listed, but only abnormal results are displayed) Labs Reviewed  COMPREHENSIVE METABOLIC PANEL - Abnormal; Notable for the following components:      Result  Value   Sodium 160 (*)    Chloride 126 (*)    Glucose, Bld 164 (*)    BUN 62 (*)    Calcium 8.0 (*)    Albumin 2.4 (*)    AST 331 (*)    ALT 353 (*)    All other components within normal limits  CBC WITH DIFFERENTIAL/PLATELET - Abnormal; Notable for the following components:   WBC 23.7 (*)    RDW 16.3 (*)    Platelets 448 (*)    Neutro Abs 21.3 (*)    Abs Immature Granulocytes 0.20 (*)    All other components within normal limits  PROTIME-INR - Abnormal; Notable for the following components:   Prothrombin Time 16.6 (*)    INR 1.3 (*)    All other components within normal limits  I-STAT CG4 LACTIC ACID, ED - Abnormal; Notable for the following components:   Lactic Acid, Venous 2.5 (*)    All other components within normal limits  I-STAT VENOUS BLOOD GAS, ED - Abnormal; Notable for the following components:   Sodium 163 (*)    Calcium, Ion 1.10 (*)    All other components within normal limits  I-STAT ARTERIAL BLOOD GAS, ED - Abnormal; Notable for the following components:   pH, Arterial 7.300 (*)    pCO2 arterial 50.4 (*)    Sodium 163 (*)    Calcium, Ion 1.12 (*)    HCT 35.0 (*)    Hemoglobin 11.9 (*)    All other components within normal limits  RESP PANEL BY RT-PCR (RSV, FLU A&B, COVID)  RVPGX2  CULTURE, BLOOD (ROUTINE X 2)  CULTURE, BLOOD (ROUTINE X 2)  URINALYSIS, W/ REFLEX TO CULTURE (INFECTION SUSPECTED)  APTT  BLOOD GAS, ARTERIAL  BLOOD GAS, ARTERIAL  BASIC METABOLIC PANEL  I-STAT CG4 LACTIC ACID, ED    EKG EKG Interpretation Date/Time:  Sunday September 06 2023 12:49:33 EST Ventricular Rate:  118 PR Interval:  146 QRS Duration:  96 QT Interval:  339 QTC Calculation: 475 R Axis:   -79  Text Interpretation: Sinus tachycardia Inferior infarct, old Anterior infarct, old Confirmed by Vivi Barrack [phone]) on 09/06/2023 4:04:32 PM  Radiology CT Head Wo Contrast Result Date: 09/06/2023 CLINICAL DATA:  Altered level of consciousness EXAM: CT HEAD WITHOUT  CONTRAST TECHNIQUE: Contiguous axial images were obtained from the base of the skull through the vertex without intravenous contrast. RADIATION DOSE REDUCTION: This exam was performed according to the departmental dose-optimization program which includes automated exposure control, adjustment of the mA and/or kV according to patient size and/or use of iterative reconstruction technique. 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 No areas of high attenuation to suggest acute hemorrhage. There are stable  chronic ischemic changes throughout the basal ganglia, thalami, periventricular white matter, bilateral frontal lobes, right occipital lobe, and left parietal lobe. No evidence of acute infarct. The lateral ventricles and remaining midline structures are unremarkable. No mass effect. Vascular: Stable atherosclerosis.  No hyperdense vessel. Skull: Normal. Negative for fracture or focal lesion. Sinuses/Orbits: Polypoid mucosal thickening left maxillary sinus. Remaining paranasal sinuses are clear. Other: None. IMPRESSION: 1. 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 No mass effect or evidence of acute hemorrhage. 2. No evidence of acute infarct. Stable extensive chronic ischemic changes as above. These results were called by telephone at the time of interpretation on 09/06/2023 at 3:43 pm to provider DR Denese Killings, who verbally acknowledged these results. Electronically Signed   By: Sharlet Salina M.D.   On: 09/06/2023 15:47   DG Chest Portable 1 View Result Date: 09/06/2023 CLINICAL DATA:  Respiratory distress with hypotension. Some shortness-of-breath. EXAM: PORTABLE CHEST 1 VIEW COMPARISON:  06/09/2023 FINDINGS:  Endotracheal tube has tip 6 cm above the carina. Enteric tube courses into the region of the stomach with tip over the stomach just left of midline. Lungs are adequately inflated with minimal hazy prominence of the perihilar markings right worse than left likely mild asymmetric vascular congestion although infection is possible. No lobar consolidation or effusion. No pneumothorax. Cardiomediastinal silhouette and remainder of the exam is unchanged. IMPRESSION: 1. Minimal hazy prominence of the perihilar markings right worse than left likely mild asymmetric vascular congestion, although infection is possible. 2. Endotracheal tube with tip 6 cm above the carina. Enteric tube with tip over the stomach just left of midline. Electronically Signed   By: Elberta Fortis M.D.   On: 09/06/2023 13:27    Procedures .Critical Care  Performed by: Loetta Rough, MD Authorized by: Loetta Rough, MD   Critical care provider statement:    Critical care time (minutes):  60   Critical care was necessary to treat or prevent imminent or life-threatening deterioration of the following conditions:  CNS failure or compromise, shock, respiratory failure and sepsis   Critical care was time spent personally by me on the following activities:  Development of treatment plan with patient or surrogate, discussions with consultants, evaluation of patient's response to treatment, examination of patient, ordering and review of laboratory studies, ordering and review of radiographic studies, ordering and performing treatments and interventions, pulse oximetry, re-evaluation of patient's condition, review of old charts, obtaining history from patient or surrogate and ventilator management   Care discussed with: admitting provider     {Document cardiac monitor, telemetry assessment procedure when appropriate:1}  Medications Ordered in ED Medications  fentaNYL in NS (57mcg/ml) infusion-PREMIX (50 mcg/hr Intravenous  Infusion Verify 09/06/23 1511)  fentaNYL (SUBLIMAZE) bolus via infusion 25-100 mcg (has no administration in time range)  propofol (DIPRIVAN) 1000 MG/100ML infusion (20 mcg/kg/min  65 kg (Order-Specific) Intravenous New Bag/Given 09/06/23 1313)  0.9 %  sodium chloride infusion (has no administration in time range)  norepinephrine (LEVOPHED) 4mg  in (0.016 mg/mL) premix infusion (6 mcg/min Intravenous New Bag/Given 09/06/23 1403)  lactated ringers infusion (has no administration in time range)  enoxaparin (LOVENOX) injection 40 mg (has no administration in time range)  pantoprazole (PROTONIX) injection 40 mg (has no administration in time range)  cefTRIAXone (ROCEPHIN) 1 g in sodium chloride 0.9 % 100 mL IVPB (has no administration in time range)  azithromycin (ZITHROMAX) 500 mg in sodium  chloride 0.9 % 250 mL IVPB (has no administration in time range)  free water 150 mL (has no administration in time range)  etomidate (AMIDATE) injection (20 mg Intravenous Given 09/06/23 1253)  succinylcholine (ANECTINE) injection (100 mg Intravenous Given 09/06/23 1253)  lactated ringers bolus 1,000 mL (1,000 mLs Intravenous New Bag/Given 09/06/23 1354)  ceFEPIme (MAXIPIME) 2 g in sodium chloride 0.9 % 100 mL IVPB (0 g Intravenous Stopped 09/06/23 1431)  acetaminophen (TYLENOL) 650 MG suppository (  Given 09/06/23 1351)  lactated ringers bolus 1,000 mL (1,000 mLs Intravenous New Bag/Given 09/06/23 1449)    ED Course/ Medical Decision Making/ A&P                          Medical Decision Making Amount and/or Complexity of Data Reviewed Labs: ordered. Decision-making details documented in ED Course. Radiology: ordered.  Risk Prescription drug management. Decision regarding hospitalization.    This patient presents to the ED for concern of resp distress, hypotension, AMS, this involves an extensive number of treatment options, and is a complaint that carries with it a high risk of complications and  morbidity.  I considered the following differential and admission for this acute, potentially life threatening condition.   MDM:    DDX for dyspnea includes but is not limited to:  Cardiac- CHF, Myocardial Ischemia, Valvular heart disease, Arrythmia, Cardiac tamponade  Respiratory - Pneumonia / atelectasis / pulmonary effusion / cavitary lung disease, Pneumothorax, COPD/ reactive airway disease, PE, ARDS  Other - Sepsis, Anemia     Patient is noted to be febrile, tachycardic, hypotensive into 80s systolic. Levophed is started in addition to fluid bolus prior to intubation to support hemodynamics during emergent intubation. ***   Clinical Course as of 09/06/23 1603  Sun Sep 06, 2023  1318 WBC(!): 23.7 +leukocytosis [HN]  1340 Admitted to ICU [HN]  1350 Comprehensive metabolic panel(!) +Hypernatremia, transaminitis, elevated BUN [HN]    Clinical Course User Index [HN] Loetta Rough, MD    Labs: I Ordered, and personally interpreted labs.  The pertinent results include:  those listed above  Imaging Studies ordered: I ordered imaging studies including CXR, CTH I independently visualized and interpreted imaging. I agree with the radiologist interpretation  Additional history obtained from chart review EMS  Cardiac Monitoring: The patient was maintained on a cardiac monitor.  I personally viewed and interpreted the cardiac monitored which showed an underlying rhythm of: sinus tachycardia  Reevaluation: After the interventions noted above, I reevaluated the patient and found that they have :improved  Social Determinants of Health: ***  Disposition:  Admitted to ICU  Co morbidities that complicate the patient evaluation  Past Medical History:  Diagnosis Date   GERD (gastroesophageal reflux disease)    Hiatal hernia    Stroke (HCC)      Medicines Meds ordered this encounter  Medications   DISCONTD: norepinephrine (LEVOPHED) 4mg  in (0.016 mg/mL) premix  infusion   etomidate (AMIDATE) injection   succinylcholine (ANECTINE) injection   fentaNYL in NS (34mcg/ml) infusion-PREMIX   fentaNYL (SUBLIMAZE) bolus via infusion 25-100 mcg    Refill:  0   propofol (DIPRIVAN) 1000 MG/100ML infusion   0.9 %  sodium chloride infusion   norepinephrine (LEVOPHED) 4mg  in (0.016 mg/mL) premix infusion    IV Access:   Peripheral   lactated ringers infusion   lactated ringers bolus 1,000 mL    Reason 30 mL/kg dose is not being ordered:  First Lactic Acid Pending   ceFEPIme (MAXIPIME) 2 g in sodium chloride 0.9 % 100 mL IVPB    Antibiotic Indication::   Other Indication (list below)    Other Indication::   Unknown Source.   DISCONTD: metroNIDAZOLE (FLAGYL) IVPB 500 mg    Antibiotic Indication::   Other Indication (list below)    Other Indication::   Unknown Source.   DISCONTD: vancomycin (VANCOCIN) IVPB 1000 mg/200 mL premix    Indication::   Other Indication (list below)    Other Indication::   Unknown Source.   acetaminophen (TYLENOL) 650 MG suppository    Threasa Beards, Chloe Y: cabinet override   enoxaparin (LOVENOX) injection 40 mg   pantoprazole (PROTONIX) injection 40 mg   cefTRIAXone (ROCEPHIN) 1 g in sodium chloride 0.9 % 100 mL IVPB    Antibiotic Indication::   CAP   azithromycin (ZITHROMAX) 500 mg in sodium chloride 0.9 % 250 mL IVPB    Antibiotic Indication::   CAP   lactated ringers bolus 1,000 mL   free water 150 mL    I have reviewed the patients home medicines and have made adjustments as needed  Problem List / ED Course: Problem List Items Addressed This Visit       Respiratory   Acute respiratory failure with hypoxia (HCC) - Primary   Other Visit Diagnoses       Hypernatremia         Septic shock (HCC)       Relevant Medications   ceFEPIme (MAXIPIME) 2 g in sodium chloride 0.9 % 100 mL IVPB (Completed)   cefTRIAXone (ROCEPHIN) 1 g in sodium chloride 0.9 % 100 mL IVPB (Start on 09/06/2023 10:00 PM)    azithromycin (ZITHROMAX) 500 mg in sodium chloride 0.9 % 250 mL IVPB     Altered mental status, unspecified altered mental status type         Transaminitis                {Document critical care time when appropriate:1} {Document review of labs and clinical decision tools ie heart score, Chads2Vasc2 etc:1}  {Document your independent review of radiology images, and any outside records:1} {Document your discussion with family members, caretakers, and with consultants:1} {Document social determinants of health affecting pt's care:1} {Document your decision making why or why not admission, treatments were needed:1}  This note was created using dictation software, which may contain spelling or grammatical errors.

## 2023-09-06 NOTE — Progress Notes (Signed)
RT transported pt on ventilator from ED Resus to CT then to 3M07 without any complications. RN at bedside.

## 2023-09-06 NOTE — ED Provider Notes (Signed)
Procedure Name: Intubation Date/Time: 09/06/2023 12:59 PM  Performed by: Olene Floss, PA-CPre-anesthesia Checklist: Patient identified, Emergency Drugs available, Suction available, Patient being monitored and Timeout performed Oxygen Delivery Method: Non-rebreather mask Preoxygenation: Pre-oxygenation with 100% oxygen Induction Type: IV induction Ventilation: Mask ventilation without difficulty Laryngoscope Size: 4 Grade View: Grade II Tube size: 7.5 mm Number of attempts: 1 Airway Equipment and Method: Rigid stylet Placement Confirmation: Positive ETCO2, ETT inserted through vocal cords under direct vision and Breath sounds checked- equal and bilateral Secured at: 26 cm Tube secured with: ETT holder     I was called to bedside to help assist intubation for my attending, Dr. Jearld Fenton, patient preoxygenated, and intubated due to hypoxia, altered mental status, tachypnea.  Please see procedure note above.  Please see her full note regarding the patient's overall care.   Olene Floss, PA-C 09/06/23 1301    Loetta Rough, MD 09/06/23 1455

## 2023-09-06 NOTE — H&P (Signed)
NAME:  Woodward Teply, MRN:  [id number], DOB:  02-Oct-1950, LOS: 0 ADMISSION DATE:  09/06/2023, CONSULTATION DATE:  1/19 REFERRING MD:  ED, CHIEF COMPLAINT:  SOB/hypotension   History of Present Illness:  Casey Rangel is a 73 yo male with HTN, HLD, previous L MCA stroke with residal aphasia, dysphagia with PEG tube dependence, and vascular dementia who presents from [facility] due to respiratory distress and hypotension. Per staff, the patient became short of breath all of a sudden. At his baseline, he is alert, but confused. His O2 sat was in the 70s on NRB and EMS gave 500 cc fluid due to initial palpated pressure of 68. He was intubated in the ED.   Pertinent  Medical History  HTN Vascular dementia Previous CVA HLD Osteoporosis Depression  Significant Hospital Events: Including procedures, antibiotic start and stop dates in addition to other pertinent events   1/19 intubated in ED, admit to ICU, started on levophed  Interim History / Subjective:  Unable to provide history from patient, see HPI above,.   Objective   Blood pressure (!) 86/63, pulse (!) 109, temperature (!) 102.8 F (39.3 C), temperature source Rectal, resp. rate (!) 24, height 5\' 8"  (1.727 m), SpO2 100%.    Vent Mode: PRVC FiO2 (%):  [100 %] 100 % Set Rate:  [24 bmp] 24 bmp Vt Set:  [540 mL] 540 mL PEEP:  [5 cmH20] 5 cmH20 Plateau Pressure:  [21 cmH20] 21 cmH20  No intake or output data in the 24 hours ending 09/06/23 1344 There were no vitals filed for this visit.  Examination: General: chronically ill appearing male, sedated on vent HENT: ETT in place Lungs: basilar crackles Cardiovascular: tachycardic rate, regular rhythm Abdomen: soft, nontender, G tube inpact Extremities: no edema Neuro: sedated GU: foley  ABG: 7.3/50.4/102/24.2  Na 160 Cr 1.21 AST/ALT 331/353 WBC 23.7 Lactic 2.5  Resolved Hospital Problem list   N/a  Assessment & Plan:  Acute hypoxic respiratory failure  likely secondary to pneumonia - Intubated in ED w vent settings: PRVC, RR 24, TV 540, PEEP 5, FiO2 100% - Repeat ABG in ~2 hrs  - VAP protocol, stress ulcer prophylaxis  - propofol, fentanyl for sedation  - obtain resp panel   Septic shock 2/2 pneumonia  - Febrile to 102.25F, hypotensive, tachycardic, WBC 23.7 - cxr with hazy opacity R>L, crackles on exam - blood cultures pending - obtain urinalysis  - d/c vanc/cefepime, start azithro/ceftriaxone - repeat LR bolus; avoid NS due to hypernatremia  - levophed, maintain MAP>65  Encephalopathy Hx of vascular dementia - likely secondary to sepsis - CT head ordered in ED - hold home donepezil and seroquel   Lactic acidosis - lactic 2.5, trend  - fluids as above  Hypovolemic hypernatremia - Na 160 (was 138 2 months ago) - Avoid overcorrection of Na (goal 150 in 24 hrs - free water deficit of 2.3 L)  - increase free water from 150 q4h (at SNF) to 150 cc q2h  - repeat BMP at 1700  AKI - Cr 1.2 from baseline of 0.8 - fluids as above  Transaminitis - AST/ALT 331/353 - likely secondary to shock, will trend  HTN - hold home coreg   Chronic dysphagia with PEG tube dependence  - NPO/hold tube feeds for now   Best Practice (right click and "Reselect all SmartList Selections" daily)   Diet/type: NPO DVT prophylaxis LMWH Pressure ulcer(s): pressure ulcer assessment deferred  GI prophylaxis: PPI Lines: N/A Foley:  Yes, and  it is still needed Code Status:  DNR Last date of multidisciplinary goals of care discussion Cristi Loron, updated 1/19]  Labs   CBC: Recent Labs  Lab 09/06/23 1249 09/06/23 1319 09/06/23 1339  WBC 23.7*  --   --   NEUTROABS 21.3*  --   --   HGB 13.1 14.6 11.9*  HCT 42.1 43.0 35.0*  MCV 95.2  --   --   PLT 448*  --   --     Basic Metabolic Panel: Recent Labs  Lab 09/06/23 1249 09/06/23 1319 09/06/23 1339  NA 160* 163* 163*  K 5.1 4.6 4.5  CL 126*  --   --   CO2 23  --   --   GLUCOSE  164*  --   --   BUN 62*  --   --   CREATININE 1.21  --   --   CALCIUM 8.0*  --   --    GFR: CrCl cannot be calculated (Unknown ideal weight.). Recent Labs  Lab 09/06/23 1249 09/06/23 1320  WBC 23.7*  --   LATICACIDVEN  --  2.5*    Liver Function Tests: Recent Labs  Lab 09/06/23 1249  AST 331*  ALT 353*  ALKPHOS 110  BILITOT 1.1  PROT 7.2  ALBUMIN 2.4*   No results for input(s): "LIPASE", "AMYLASE" in the last 168 hours. No results for input(s): "AMMONIA" in the last 168 hours.  ABG    Component Value Date/Time   PHART 7.300 (L) 09/06/2023 1339   PCO2ART 50.4 (H) 09/06/2023 1339   PO2ART 102 09/06/2023 1339   HCO3 24.2 09/06/2023 1339   TCO2 26 09/06/2023 1339   ACIDBASEDEF 2.0 09/06/2023 1339   O2SAT 96 09/06/2023 1339     Coagulation Profile: Recent Labs  Lab 09/06/23 1249  INR 1.3*    Cardiac Enzymes: No results for input(s): "CKTOTAL", "CKMB", "CKMBINDEX", "TROPONINI" in the last 168 hours.  HbA1C: Hgb A1c MFr Bld  Date/Time Value Ref Range Status  04/16/2023 11:00 AM 5.2 4.8 - 5.6 % Final    Comment:    (NOTE) Pre diabetes:          5.7%-6.4%  Diabetes:              >6.4%  Glycemic control for   <7.0% adults with diabetes   01/16/2022 04:49 AM 5.1 4.8 - 5.6 % Final    Comment:    (NOTE) Pre diabetes:          5.7%-6.4%  Diabetes:              >6.4%  Glycemic control for   <7.0% adults with diabetes     CBG: No results for input(s): "GLUCAP" in the last 168 hours.  Review of Systems:   Unable to obtain  Past Medical History:  He,  has a past medical history of GERD (gastroesophageal reflux disease), Hiatal hernia, and Stroke (HCC).   Surgical History:   Past Surgical History:  Procedure Laterality Date   ESOPHAGOGASTRODUODENOSCOPY (EGD) WITH PROPOFOL N/A 04/30/2023   Procedure: ESOPHAGOGASTRODUODENOSCOPY (EGD) WITH PROPOFOL;  Surgeon: Violeta Gelinas, MD;  Location: [facility] ENDOSCOPY;  Service: General;  Laterality: N/A;    INTRAMEDULLARY (IM) NAIL INTERTROCHANTERIC Left 10/17/2020   Procedure: INTRAMEDULLARY (IM) NAIL INTERTROCHANTRIC;  Surgeon: Kennedy Bucker, MD;  Location: [facility] ORS;  Service: Orthopedics;  Laterality: Left;   INTRAMEDULLARY (IM) NAIL INTERTROCHANTERIC Right 06/10/2023   Procedure: INTRAMEDULLARY (IM) NAIL INTERTROCHANTERIC;  Surgeon: Roby Lofts, MD;  Location: [facility] OR;  Service: Orthopedics;  Laterality: Right;   IR CT HEAD LTD  01/15/2022   IR CT HEAD LTD  04/16/2023   IR CT HEAD LTD  04/16/2023   IR INTRA CRAN STENT  04/16/2023   IR PERCUTANEOUS ART THROMBECTOMY/INFUSION INTRACRANIAL INC DIAG ANGIO  01/15/2022   IR PERCUTANEOUS ART THROMBECTOMY/INFUSION INTRACRANIAL INC DIAG ANGIO  04/16/2023   IR PTA INTRACRANIAL  01/15/2022   IR US GUIDE VASC ACCESS RIGHT  01/15/2022   PEG PLACEMENT N/A 04/30/2023   Procedure: PERCUTANEOUS ENDOSCOPIC GASTROSTOMY (PEG) PLACEMENT;  Surgeon: Violeta Gelinas, MD;  Location: [facility] ENDOSCOPY;  Service: General;  Laterality: N/A;   RADIOLOGY WITH ANESTHESIA N/A 01/15/2022   Procedure: RADIOLOGY WITH ANESTHESIA;  Surgeon: Julieanne Cotton, MD;  Location: [facility] OR;  Service: Radiology;  Laterality: N/A;   RADIOLOGY WITH ANESTHESIA N/A 04/16/2023   Procedure: IR WITH ANESTHESIA;  Surgeon: Radiologist, Medication, MD;  Location: [facility] OR;  Service: Radiology;  Laterality: N/A;   T9-T11 fusion in 1990       Social History:   reports that he quit smoking about 24 years ago. His smoking use included cigarettes. He started smoking about 54 years ago. He has a 45 pack-year smoking history. He has never used smokeless tobacco. He reports that he does not currently use alcohol. He reports that he does not use drugs.   Family History:  His family history includes Hearing loss in his mother and paternal grandmother; Liver disease in his brother.   Allergies Allergies  Allergen Reactions   Penicillins Other (See Comments)    Unknown      Home Medications  Prior to Admission  medications   Medication Sig Start Date End Date Taking? Authorizing Provider  acetaminophen (TYLENOL) 160 MG/5ML solution Place 20.3 mLs (650 mg total) into feeding tube every 6 (six) hours as needed for mild pain (pain score 1-3), headache or fever. 06/12/23   Lonia Blood, MD  aspirin 81 MG chewable tablet Place 1 tablet (81 mg total) into feeding tube daily. 05/03/23   Lynnae January, NP  carvedilol (COREG) 3.125 MG tablet Take 1 tablet (3.125 mg total) by mouth 2 (two) times daily with a meal. 01/21/22   de Saintclair Halsted, Cortney E, NP  divalproex (DEPAKOTE SPRINKLE) 125 MG capsule Take 125 mg by mouth daily.    [provider]  donepezil (ARICEPT) 10 MG tablet Place 1 tablet (10 mg total) into feeding tube at bedtime. 06/12/23   Lonia Blood, MD  HYDROcodone-acetaminophen (NORCO/VICODIN) 5-325 MG tablet Place 1-2 tablets into feeding tube every 6 (six) hours as needed for moderate pain (pain score 4-6) or severe pain (pain score 7-10). 06/12/23   Lonia Blood, MD  hydrOXYzine (ATARAX) 25 MG tablet Take 25 mg by mouth every 8 (eight) hours as needed (agitation).    [provider]  Nutritional Supplements (FEEDING SUPPLEMENT, OSMOLITE 1.5 CAL,) LIQD Place 1,000 mLs into feeding tube continuous. 06/12/23   Lonia Blood, MD  Protein (FEEDING SUPPLEMENT, PROSOURCE TF20,) liquid Place 60 mLs into feeding tube daily. 06/13/23   Lonia Blood, MD  QUEtiapine (SEROQUEL) 25 MG tablet Place 0.5 tablets (12.5 mg total) into feeding tube 2 (two) times daily. 05/02/23   Lynnae January, NP  rosuvastatin (CRESTOR) 20 MG tablet Place 1 tablet (20 mg total) into feeding tube daily. 05/03/23   Lynnae January, NP  Vitamin D, Cholecalciferol, 25 MCG (1000 UT) CAPS Take 1,000 Units by mouth daily. 06/12/23   Thyra Breed  A, PA-C  Water For Irrigation, Sterile (FREE WATER) SOLN Place 150 mLs into feeding tube every 4 (four) hours. 06/12/23   Lonia Blood, MD     Critical  care time: 35 min

## 2023-09-07 DIAGNOSIS — J9602 Acute respiratory failure with hypercapnia: Secondary | ICD-10-CM

## 2023-09-07 DIAGNOSIS — K7200 Acute and subacute hepatic failure without coma: Secondary | ICD-10-CM

## 2023-09-07 DIAGNOSIS — G9349 Other encephalopathy: Secondary | ICD-10-CM

## 2023-09-07 DIAGNOSIS — I10 Essential (primary) hypertension: Secondary | ICD-10-CM

## 2023-09-07 DIAGNOSIS — N179 Acute kidney failure, unspecified: Secondary | ICD-10-CM

## 2023-09-07 DIAGNOSIS — E870 Hyperosmolality and hypernatremia: Secondary | ICD-10-CM

## 2023-09-07 LAB — GLUCOSE, CAPILLARY
Glucose-Capillary: 75 mg/dL (ref 70–99)
Glucose-Capillary: 99 mg/dL (ref 70–99)
Glucose-Capillary: 61 mg/dL — ABNORMAL LOW (ref 70–99)
Glucose-Capillary: 117 mg/dL — ABNORMAL HIGH (ref 70–99)
Glucose-Capillary: 106 mg/dL — ABNORMAL HIGH (ref 70–99)
Glucose-Capillary: 115 mg/dL — ABNORMAL HIGH (ref 70–99)
Glucose-Capillary: 139 mg/dL — ABNORMAL HIGH (ref 70–99)
Glucose-Capillary: 116 mg/dL — ABNORMAL HIGH (ref 70–99)

## 2023-09-07 LAB — POCT I-STAT 7, (LYTES, BLD GAS, ICA,H+H)
Acid-base deficit: 2 mmol/L (ref 0.0–2.0)
Bicarbonate: 21.6 mmol/L (ref 20.0–28.0)
Calcium, Ion: 1.06 mmol/L — ABNORMAL LOW (ref 1.15–1.40)
HCT: 27 % — ABNORMAL LOW (ref 39.0–52.0)
Hemoglobin: 9.2 g/dL — ABNORMAL LOW (ref 13.0–17.0)
O2 Saturation: 90 %
Patient temperature: 38.2
Potassium: 3.9 mmol/L (ref 3.5–5.1)
Sodium: 156 mmol/L — ABNORMAL HIGH (ref 135–145)
TCO2: 23 mmol/L (ref 22–32)
pCO2 arterial: 35 mm[Hg] (ref 32–48)
pH, Arterial: 7.404 (ref 7.35–7.45)
pO2, Arterial: 61 mm[Hg] — ABNORMAL LOW (ref 83–108)

## 2023-09-07 LAB — MAGNESIUM: Magnesium: 2.5 mg/dL — ABNORMAL HIGH (ref 1.7–2.4)

## 2023-09-07 LAB — CBC
HCT: 33.3 % — ABNORMAL LOW (ref 39.0–52.0)
Hemoglobin: 10.3 g/dL — ABNORMAL LOW (ref 13.0–17.0)
MCH: 29.7 pg (ref 26.0–34.0)
MCHC: 30.9 g/dL (ref 30.0–36.0)
MCV: 96 fL (ref 80.0–100.0)
Platelets: 304 10*3/uL (ref 150–400)
RBC: 3.47 MIL/uL — ABNORMAL LOW (ref 4.22–5.81)
RDW: 16.2 % — ABNORMAL HIGH (ref 11.5–15.5)
WBC: 28.8 10*3/uL — ABNORMAL HIGH (ref 4.0–10.5)
nRBC: 0 % (ref 0.0–0.2)

## 2023-09-07 LAB — COMPREHENSIVE METABOLIC PANEL
ALT: 227 U/L — ABNORMAL HIGH (ref 0–44)
AST: 144 U/L — ABNORMAL HIGH (ref 15–41)
Albumin: 1.8 g/dL — ABNORMAL LOW (ref 3.5–5.0)
Alkaline Phosphatase: 92 U/L (ref 38–126)
Anion gap: 13 (ref 5–15)
BUN: 51 mg/dL — ABNORMAL HIGH (ref 8–23)
CO2: 22 mmol/L (ref 22–32)
Calcium: 7.5 mg/dL — ABNORMAL LOW (ref 8.9–10.3)
Chloride: 116 mmol/L — ABNORMAL HIGH (ref 98–111)
Creatinine, Ser: 1.11 mg/dL (ref 0.61–1.24)
GFR, Estimated: >60 mL/min (ref >60)
Glucose, Bld: 85 mg/dL (ref 70–99)
Potassium: 4.3 mmol/L (ref 3.5–5.1)
Sodium: 151 mmol/L — ABNORMAL HIGH (ref 135–145)
Total Bilirubin: 1.4 mg/dL — ABNORMAL HIGH (ref 0.0–1.2)
Total Protein: 5.6 g/dL — ABNORMAL LOW (ref 6.5–8.1)

## 2023-09-07 LAB — TRIGLYCERIDES: Triglycerides: 100 mg/dL (ref <150)

## 2023-09-07 LAB — LACTIC ACID, PLASMA
Lactic Acid, Venous: 4 mmol/L (ref 0.5–1.9)
Lactic Acid, Venous: 3.5 mmol/L (ref 0.5–1.9)

## 2023-09-07 LAB — PHOSPHORUS: Phosphorus: 4 mg/dL (ref 2.5–4.6)

## 2023-09-07 MED ORDER — ACETAMINOPHEN 325 MG PO TABS
650.0000 mg | ORAL_TABLET | Freq: Four times a day (QID) | ORAL | Status: DC | PRN
  Administered 2023-09-07: 650 mg
  Filled 2023-09-07: qty 2

## 2023-09-07 MED ORDER — DEXTROSE 50 % IV SOLN: 12.5000 g | INTRAVENOUS | Status: AC

## 2023-09-07 MED ORDER — ORAL CARE MOUTH RINSE: 15.0000 mL | OROMUCOSAL | Status: DC | PRN

## 2023-09-07 MED ORDER — DEXTROSE 50 % IV SOLN
INTRAVENOUS | Status: AC
  Administered 2023-09-07: 12.5 g via INTRAVENOUS
  Filled 2023-09-07: qty 50

## 2023-09-07 MED ORDER — VALPROIC ACID 250 MG/5ML PO SOLN
125.0000 mg | Freq: Two times a day (BID) | ORAL | Status: DC
  Administered 2023-09-07 – 2023-09-08 (×3): 125 mg
  Filled 2023-09-07 (×3): qty 5

## 2023-09-07 MED ORDER — LACTATED RINGERS IV BOLUS (SEPSIS)
1000.0000 mL | Freq: Once | INTRAVENOUS | Status: AC
  Administered 2023-09-07: 1000 mL via INTRAVENOUS

## 2023-09-07 MED ORDER — THIAMINE MONONITRATE 100 MG PO TABS
100.0000 mg | ORAL_TABLET | Freq: Every day | ORAL | Status: DC
  Administered 2023-09-07 – 2023-09-08 (×2): 100 mg
  Filled 2023-09-07 (×2): qty 1

## 2023-09-07 MED ORDER — OSMOLITE 1.5 CAL PO LIQD
1000.0000 mL | ORAL | Status: DC
  Administered 2023-09-07: 1000 mL
  Filled 2023-09-07: qty 1000

## 2023-09-07 MED ORDER — FREE WATER
150.0000 mL | Freq: Four times a day (QID) | Status: DC
  Administered 2023-09-07 – 2023-09-08 (×4): 150 mL

## 2023-09-07 MED ORDER — HYDROCORTISONE SOD SUC (PF) 100 MG IJ SOLR
100.0000 mg | Freq: Two times a day (BID) | INTRAMUSCULAR | Status: DC
  Administered 2023-09-07 (×2): 100 mg via INTRAVENOUS
  Filled 2023-09-07 (×2): qty 2

## 2023-09-07 MED ORDER — SODIUM CHLORIDE 0.9 % IV BOLUS
500.0000 mL | Freq: Once | INTRAVENOUS | Status: AC
  Administered 2023-09-07: 500 mL via INTRAVENOUS

## 2023-09-07 MED ORDER — LACTATED RINGERS IV BOLUS (SEPSIS)
1000.0000 mL | Freq: Once | INTRAVENOUS | Status: AC
Start: 2023-09-07 — End: 2023-09-07
  Administered 2023-09-07: 1000 mL via INTRAVENOUS

## 2023-09-07 MED ORDER — ORAL CARE MOUTH RINSE
15.0000 mL | OROMUCOSAL | Status: DC
  Administered 2023-09-07 – 2023-09-08 (×10): 15 mL via OROMUCOSAL

## 2023-09-07 MED ORDER — CHLORHEXIDINE GLUCONATE CLOTH 2 % EX PADS
6.0000 | MEDICATED_PAD | Freq: Every day | CUTANEOUS | Status: DC
  Administered 2023-09-07: 6 via TOPICAL

## 2023-09-07 MED ORDER — PROSOURCE TF20 ENFIT COMPATIBL EN LIQD
60.0000 mL | Freq: Every day | ENTERAL | Status: DC
  Administered 2023-09-07 – 2023-09-08 (×2): 60 mL
  Filled 2023-09-07 (×2): qty 60

## 2023-09-07 NOTE — Progress Notes (Signed)
NAME:  Casey Rangel, MRN:  [id number], DOB:  08-17-1951, LOS: 1 ADMISSION DATE:  09/06/2023, CONSULTATION DATE:  1/19 REFERRING MD:  ED, CHIEF COMPLAINT:  SOB/hypotension   History of Present Illness:  [name] is a 73 yo male with HTN, HLD, previous L MCA stroke with residal aphasia, dysphagia with PEG tube dependence, and vascular dementia who presents from [facility] due to respiratory distress and hypotension. Per staff, the patient became short of breath all of a sudden. At his baseline, he is alert, but confused. His O2 sat was in the 70s on NRB and EMS gave 500 cc fluid due to initial palpated pressure of 68. He was intubated in the ED.   Pertinent  Medical History  HTN Vascular dementia Previous CVA HLD Osteoporosis Depression  Significant Hospital Events: Including procedures, antibiotic start and stop dates in addition to other pertinent events   1/19 intubated in ED, admit to ICU, started on levophed  Interim History / Subjective:  Patient continued require vasopressor support, currently on Levophed 10 mics Continue to spike fever with Tmax 101  Objective   Blood pressure (!) 99/56, pulse 84, temperature (!) 100.8 F (38.2 C), resp. rate 20, height 5\' 8"  (1.727 m), weight 61 kg, SpO2 100%.    Vent Mode: PRVC FiO2 (%):  [70 %-100 %] 70 % Set Rate:  [24 bmp-25 bmp] 25 bmp Vt Set:  [540 mL] 540 mL PEEP:  [5 cmH20-8 cmH20] 8 cmH20 Plateau Pressure:  [17 cmH20-24 cmH20] 24 cmH20   Intake/Output Summary (Last 24 hours) at 09/07/2023 0953 Last data filed at 09/07/2023 0900 Gross per 24 hour  Intake 3974.74 ml  Output 690 ml  Net 3284.74 ml   Filed Weights   09/06/23 1600  Weight: 61 kg    Examination: General: Crtitically ill-appearing elderly male, orally intubated HEENT: /AT, eyes anicteric.  ETT and OGT in place Neuro: Eyes open, not following commands, moving all 4 extremities spontaneously Chest: Bilateral faint basal crackles no wheezes  or rhonchi Heart: Regular rate and rhythm, no murmurs or gallops Abdomen: Soft, nondistended, bowel sounds present Skin: No rash   Labs and images reviewed Resolved Hospital Problem list   N/a  Assessment & Plan:  Acute respiratory failure with hypoxia and hypercapnia Severe sepsis with septic shock due to community-acquired pneumonia Continue lung protective ventilation VAP prevention bundle in place Hypercapnia has cleared PAD protocol with fentanyl and propofol Continue to require vasopressor support with Levophed, currently on 10 mics Started on stress dose steroid Continue antibiotics with ceftriaxone and azithromycin He looks dehydrated we will give him 1 L of IV fluid bolus with LR  Acute septic encephalopathy Vascular dementia Minimize sedation with RASS goal 0/-1 CT head showed chronic changes, nothing acute Hold donepezil Started back on Depakote as mood stabilizer  Lactic acidosis Lactate has trended up to 4, now its 3.5 Closely monitor  Hypovolemic hypernatremia Acute kidney injury due to septic ATN Patient presented with serum sodium of 163 Continue free water flushes Now serum sodium trended down to 151 Monitor intake and output Avoid nephrotoxic agents  Shock liver LFTs are trending down, closely monitor Avoid hepatotoxic agent  HTN Hold Coreg  Chronic dysphagia with PEG tube dependence Start tube feeds   Best Practice (right click and "Reselect all SmartList Selections" daily)   Diet/type: NPO start tube feed DVT prophylaxis LMWH Pressure ulcer(s): pressure ulcer assessment deferred  GI prophylaxis: PPI Lines: N/A Foley:  Yes, and it is still needed Code  Status:  DNR Last date of multidisciplinary goals of care discussion Cristi Loron, updated 1/19]  Labs   CBC: Recent Labs  Lab 09/06/23 1249 09/06/23 1319 09/06/23 1339 09/06/23 1713 09/07/23 0411 09/07/23 0727  WBC 23.7*  --   --   --   --  28.8*  NEUTROABS 21.3*  --   --    --   --   --   HGB 13.1 14.6 11.9* 9.9* 9.2* 10.3*  HCT 42.1 43.0 35.0* 29.0* 27.0* 33.3*  MCV 95.2  --   --   --   --  96.0  PLT 448*  --   --   --   --  304    Basic Metabolic Panel: Recent Labs  Lab 09/06/23 1249 09/06/23 1319 09/06/23 1339 09/06/23 1614 09/06/23 1713 09/07/23 0411 09/07/23 0727  NA 160*   < > 163* 156* 159* 156* 151*  K 5.1   < > 4.5 5.1 4.0 3.9 4.3  CL 126*  --   --  125*  --   --  116*  CO2 23  --   --  14*  --   --  22  GLUCOSE 164*  --   --  111*  --   --  85  BUN 62*  --   --  63*  --   --  51*  CREATININE 1.21  --   --  1.09  --   --  1.11  CALCIUM 8.0*  --   --  7.2*  --   --  7.5*   < > = values in this interval not displayed.   GFR: Estimated Creatinine Clearance: 51.9 mL/min (by C-G formula based on SCr of 1.11 mg/dL). Recent Labs  Lab 09/06/23 1249 09/06/23 1320 09/07/23 0106 09/07/23 0727  WBC 23.7*  --   --  28.8*  LATICACIDVEN  --  2.5* 4.0* 3.5*    Liver Function Tests: Recent Labs  Lab 09/06/23 1249 09/07/23 0727  AST 331* 144*  ALT 353* 227*  ALKPHOS 110 92  BILITOT 1.1 1.4*  PROT 7.2 5.6*  ALBUMIN 2.4* 1.8*   No results for input(s): "LIPASE", "AMYLASE" in the last 168 hours. No results for input(s): "AMMONIA" in the last 168 hours.  ABG    Component Value Date/Time   PHART 7.404 09/07/2023 0411   PCO2ART 35.0 09/07/2023 0411   PO2ART 61 (L) 09/07/2023 0411   HCO3 21.6 09/07/2023 0411   TCO2 23 09/07/2023 0411   ACIDBASEDEF 2.0 09/07/2023 0411   O2SAT 90 09/07/2023 0411     Coagulation Profile: Recent Labs  Lab 09/06/23 1249  INR 1.3*    Cardiac Enzymes: No results for input(s): "CKTOTAL", "CKMB", "CKMBINDEX", "TROPONINI" in the last 168 hours.  HbA1C: Hgb A1c MFr Bld  Date/Time Value Ref Range Status  04/16/2023 11:00 AM 5.2 4.8 - 5.6 % Final    Comment:    (NOTE) Pre diabetes:          5.7%-6.4%  Diabetes:              >6.4%  Glycemic control for   <7.0% adults with diabetes   01/16/2022  04:49 AM 5.1 4.8 - 5.6 % Final    Comment:    (NOTE) Pre diabetes:          5.7%-6.4%  Diabetes:              >6.4%  Glycemic control for   <7.0% adults with diabetes  CBG: Recent Labs  Lab 09/06/23 1939 09/06/23 2321 09/07/23 0015 09/07/23 0319 09/07/23 0734  GLUCAP 98 64* 99 75 61*    The patient is critically ill due to septic shock/acute respiratory failure.  Critical care was necessary to treat or prevent imminent or life-threatening deterioration.  Critical care was time spent personally by me on the following activities: development of treatment plan with patient and/or surrogate as well as nursing, discussions with consultants, evaluation of patient's response to treatment, examination of patient, obtaining history from patient or surrogate, ordering and performing treatments and interventions, ordering and review of laboratory studies, ordering and review of radiographic studies, pulse oximetry, re-evaluation of patient's condition and participation in multidisciplinary rounds.   During this encounter critical care time was devoted to patient care services described in this note for 39 minutes.     Cheri Fowler, MD [facility] See Amion for pager If no response to pager, please call [phone] until 7pm After 7pm, Please call E-link [phone]

## 2023-09-07 NOTE — Progress Notes (Signed)
Initial Nutrition Assessment  DOCUMENTATION CODES:  Severe malnutrition in context of chronic illness  INTERVENTION:  Initiate tube feeding via PEG: Osmolite 1.5 at 55 ml/h (1320 ml per day) Start at 25 and advance by 10mL q8h to goal Prosource TF20 60 ml 1x/d Free Water Flush q6h Provides 2060 kcal, 103 gm protein, 1006 ml free water daily (TF+flush = 1,61mL free water) Thiamine 100mg  x 5 days for risk of refeeding as pt is malnourished Monitor magnesium and phosphorus every 12 hours x 4 occurrences, MD to replete as needed, as pt is at risk for refeeding syndrome given malnutrition  NUTRITION DIAGNOSIS:  Severe Malnutrition related to chronic illness (CVA with residual deficits and dysphagia, PEG dependent) as evidenced by severe fat depletion, severe muscle depletion, percent weight loss (9.5% x 3 months).  GOAL:  Patient will meet greater than or equal to 90% of their needs  MONITOR:  PO intake  REASON FOR ASSESSMENT:  Ventilator, Consult Enteral/tube feeding initiation and management  ASSESSMENT:  Pt with hx of HTN, HLD, hx L MCA stroke (residual aphasia and PEG), and dementia presented to ED from SNF with hypotension and respiratory distress.  1/19 - presented to ED, intubated  Patient is currently intubated on ventilator support. No family present at the time of assessment to provide a nutrition hx. Discussed in rounds, RN reports hypoglycemia last night and this AM. MD ok to resume feeds via PEG.   On exam, pt with muscle and fat depletions and appears to have lost weight. Consistent with malnutrition. Reviewed physical findings from admission in fall 2024 and pt with slightly more depletions consistent with weight loss since that admission.  MV: 14 L/min Temp (24hrs), Avg:100.2 F (37.9 C), Min:97.7 F (36.5 C), Max:102.2 F (39 C)  Propofol: 7.8 ml/hr  Admit / Current weight: 61 kg 9.5% weight loss noted in the last 3 months if accurate, severe  (10/16-1/19)   Intake/Output Summary (Last 24 hours) at 09/07/2023 1417 Last data filed at 09/07/2023 1312 Gross per 24 hour  Intake 5617.73 ml  Output 850 ml  Net 4767.73 ml  Net IO Since Admission: 4,767.73 mL [09/07/23 1417]  Drains/Lines: PEG 22 Fr UOP out x 24 hours  Nutritionally Relevant Medications: Scheduled Meds:  free water  150 mL Per Tube Q2H   pantoprazole (PROTONIX) IV  40 mg Intravenous Q24H   Continuous Infusions:  azithromycin Stopped (09/06/23 1856)   cefTRIAXone (ROCEPHIN)  IV Stopped (09/06/23 2134)   norepinephrine (LEVOPHED) Adult infusion 10 mcg/min (09/07/23 0900)   propofol (DIPRIVAN) infusion 20 mcg/kg/min (09/07/23 0900)   Labs Reviewed: Na 151, chloride 116 BUN 51 CBG ranges from 61-104 mg/dL over the last 24 hours HgbA1c 5.2% (8/29)  NUTRITION - FOCUSED PHYSICAL EXAM: Flowsheet Row Most Recent Value  Orbital Region Severe depletion  Upper Arm Region Mild depletion  Thoracic and Lumbar Region Mild depletion  Buccal Region Severe depletion  Temple Region Severe depletion  Clavicle Bone Region Mild depletion  Clavicle and Acromion Bone Region Moderate depletion  Scapular Bone Region Mild depletion  Dorsal Hand No depletion  Patellar Region Severe depletion  Anterior Thigh Region Severe depletion  Posterior Calf Region Severe depletion  Edema (RD Assessment) None  Hair Reviewed  Eyes Reviewed  Mouth Reviewed  Skin Reviewed  Nails Reviewed    Diet Order:   Diet Order             Diet NPO time specified  Diet effective now  EDUCATION NEEDS:  Not appropriate for education at this time  Skin:  Skin Assessment: Reviewed RN Assessment DTI: - left heel - right heel Unstageable: - right buttocks  Last BM:  PTA  Height:  Ht Readings from Last 1 Encounters:  09/06/23 5\' 8"  (1.727 m)    Weight:  Wt Readings from Last 1 Encounters:  09/06/23 61 kg    Ideal Body Weight:  70 kg  BMI:  Body mass  index is 20.45 kg/m.  Estimated Nutritional Needs:  Kcal:  1900-2100 kcal/d Protein:  90-105g/d Fluid:  >/=2L/d    Greig Castilla, RD, LDN Registered Dietitian II Please reach out via secure chat Weekend on-call pager # available in [facility]

## 2023-09-07 NOTE — Progress Notes (Signed)
eLink Physician-Brief Progress Note Patient Name: Myshaun Holdt DOB: Oct 09, 1950 MRN: [id number]   Date of Service  09/07/2023  HPI/Events of Note  74 M Septic shock, PNA. AKI. S/p SDH, PEG status. On ventilator. Hypernatremia.  Discussed with RN. On levo at 8. Sedation. MAP 72. SBP low  Camera eval done   eICU Interventions  Saline 500 ml bolus, repeat LA in 2 hrs.       Intervention Category Intermediate Interventions: Other:;Hypotension - evaluation and management  Ranee Gosselin 09/07/2023, 2:15 AM

## 2023-09-08 DIAGNOSIS — E43 Unspecified severe protein-calorie malnutrition: Secondary | ICD-10-CM | POA: Insufficient documentation

## 2023-09-08 DIAGNOSIS — A419 Sepsis, unspecified organism: Secondary | ICD-10-CM

## 2023-09-08 DIAGNOSIS — G9340 Encephalopathy, unspecified: Secondary | ICD-10-CM

## 2023-09-08 LAB — CBC
HCT: 26.6 % — ABNORMAL LOW (ref 39.0–52.0)
Hemoglobin: 8.4 g/dL — ABNORMAL LOW (ref 13.0–17.0)
MCH: 29.4 pg (ref 26.0–34.0)
MCHC: 31.6 g/dL (ref 30.0–36.0)
MCV: 93 fL (ref 80.0–100.0)
Platelets: 324 10*3/uL (ref 150–400)
RBC: 2.86 MIL/uL — ABNORMAL LOW (ref 4.22–5.81)
RDW: 15 % (ref 11.5–15.5)
WBC: 26.9 10*3/uL — ABNORMAL HIGH (ref 4.0–10.5)
nRBC: 0 % (ref 0.0–0.2)

## 2023-09-08 LAB — BASIC METABOLIC PANEL
Anion gap: 7 (ref 5–15)
BUN: 41 mg/dL — ABNORMAL HIGH (ref 8–23)
CO2: 22 mmol/L (ref 22–32)
Calcium: 7.6 mg/dL — ABNORMAL LOW (ref 8.9–10.3)
Chloride: 115 mmol/L — ABNORMAL HIGH (ref 98–111)
Creatinine, Ser: 0.87 mg/dL (ref 0.61–1.24)
GFR, Estimated: >60 mL/min (ref >60)
Glucose, Bld: 154 mg/dL — ABNORMAL HIGH (ref 70–99)
Potassium: 4 mmol/L (ref 3.5–5.1)
Sodium: 144 mmol/L (ref 135–145)

## 2023-09-08 LAB — GLUCOSE, CAPILLARY
Glucose-Capillary: 137 mg/dL — ABNORMAL HIGH (ref 70–99)
Glucose-Capillary: 142 mg/dL — ABNORMAL HIGH (ref 70–99)
Glucose-Capillary: 117 mg/dL — ABNORMAL HIGH (ref 70–99)

## 2023-09-08 LAB — MAGNESIUM: Magnesium: 2.5 mg/dL — ABNORMAL HIGH (ref 1.7–2.4)

## 2023-09-08 LAB — PHOSPHORUS: Phosphorus: 2.8 mg/dL (ref 2.5–4.6)

## 2023-09-08 MED ORDER — GLYCOPYRROLATE 0.2 MG/ML IJ SOLN
0.2000 mg | INTRAMUSCULAR | Status: DC | PRN
  Administered 2023-09-08: 0.2 mg via INTRAVENOUS
  Filled 2023-09-08: qty 1

## 2023-09-08 MED ORDER — GLYCOPYRROLATE 1 MG PO TABS: 1.0000 mg | ORAL_TABLET | ORAL | Status: DC | PRN

## 2023-09-08 MED ORDER — MIDAZOLAM HCL 2 MG/2ML IJ SOLN
2.0000 mg | INTRAMUSCULAR | Status: DC | PRN
  Administered 2023-09-08: 2 mg via INTRAVENOUS
  Filled 2023-09-08: qty 2

## 2023-09-08 MED ORDER — POLYVINYL ALCOHOL 1.4 % OP SOLN: 1.0000 [drp] | Freq: Four times a day (QID) | OPHTHALMIC | Status: DC | PRN

## 2023-09-08 MED ORDER — MORPHINE BOLUS VIA INFUSION
5.0000 mg | INTRAVENOUS | Status: DC | PRN
  Administered 2023-09-08 (×4): 5 mg via INTRAVENOUS

## 2023-09-08 MED ORDER — ACETAMINOPHEN 325 MG PO TABS: 650.0000 mg | ORAL_TABLET | Freq: Four times a day (QID) | ORAL | Status: DC | PRN

## 2023-09-08 MED ORDER — MORPHINE 100MG IN NS 100ML (1MG/ML) PREMIX INFUSION
0.0000 mg/h | INTRAVENOUS | Status: DC
  Administered 2023-09-08: 20 mg/h via INTRAVENOUS
  Administered 2023-09-08: 5 mg/h via INTRAVENOUS
  Administered 2023-09-09: 20 mg/h via INTRAVENOUS
  Filled 2023-09-08 (×3): qty 100

## 2023-09-08 MED ORDER — QUETIAPINE FUMARATE 50 MG PO TABS
50.0000 mg | ORAL_TABLET | Freq: Two times a day (BID) | ORAL | Status: DC
  Administered 2023-09-08: 50 mg
  Filled 2023-09-08: qty 1

## 2023-09-08 MED ORDER — GLYCOPYRROLATE 0.2 MG/ML IJ SOLN: 0.2000 mg | INTRAMUSCULAR | Status: DC | PRN

## 2023-09-08 MED ORDER — HYDROCORTISONE SOD SUC (PF) 100 MG IJ SOLR
100.0000 mg | Freq: Every day | INTRAMUSCULAR | Status: DC
  Administered 2023-09-08: 100 mg via INTRAVENOUS
  Filled 2023-09-08: qty 2

## 2023-09-08 MED ORDER — ACETAMINOPHEN 650 MG RE SUPP: 650.0000 mg | Freq: Four times a day (QID) | RECTAL | Status: DC | PRN

## 2023-09-08 MED ORDER — MUPIROCIN 2 % EX OINT
TOPICAL_OINTMENT | Freq: Two times a day (BID) | CUTANEOUS | Status: DC
  Filled 2023-09-08: qty 22

## 2023-09-08 MED ORDER — ATORVASTATIN CALCIUM 40 MG PO TABS
40.0000 mg | ORAL_TABLET | Freq: Every day | ORAL | Status: DC
  Administered 2023-09-08: 40 mg
  Filled 2023-09-08: qty 1

## 2023-09-08 MED ORDER — ASPIRIN 81 MG PO CHEW
81.0000 mg | CHEWABLE_TABLET | Freq: Every day | ORAL | Status: DC
  Administered 2023-09-08: 81 mg
  Filled 2023-09-08: qty 1

## 2023-09-08 MED ORDER — DONEPEZIL HCL 5 MG PO TABS: 10.0000 mg | ORAL_TABLET | Freq: Every day | ORAL | Status: DC

## 2023-09-08 NOTE — IPAL (Signed)
Interdisciplinary Goals of Care Family Meeting   Date carried out:: 09/08/2023  Location of the meeting: Phone conference  Member's involved: Physician, Bedside Registered Nurse, Social Worker, and Family Member or next of kin  Durable Power of Attorney or acting medical decision maker: Barbee Shropshire    Discussion: We discussed goals of care for [employer] .    The Clinical status was relayed to patient's sister over the phone in detail.   Updated and notified of patients medical condition.     Patient remains minimally responsive and does not follow commands.   Patient is having a weak cough and struggling to remove secretions.   Patient with increased WOB and using accessory muscles to breathe Explained to family course of therapy and the modalities    Patient with Progressive multiorgan failure with a very high probablity of a very minimal chance of meaningful recovery despite all aggressive and optimal medical therapy.  Code status: Full DNR  Disposition: In-patient comfort care    Family are satisfied with Plan of action and management. All questions answered   Cheri Fowler MD [place] [facility] See Amion for pager If no response to pager, please call [phone] until 7pm After 7pm, Please call E-link [phone]

## 2023-09-08 NOTE — Procedures (Signed)
Extubation Procedure Note  Patient Details:   Name: Casey Rangel DOB: 1950/12/11 MRN: [id number]   Airway Documentation:    Vent end date: 09/08/23 Vent end time: 0842   Evaluation  O2 sats: stable throughout Complications: No apparent complications Patient did tolerate procedure well. Bilateral Breath Sounds: Clear, Diminished   No  Pt extubated per MD order. Placed on 3L [place]. Positive cuff leak noted, no stridor heard. RT will continue to monitor.   Vicente Masson 09/08/2023, 8:45 AM

## 2023-09-08 NOTE — Progress Notes (Addendum)
NAME:  Casey Rangel, MRN:  [id number], DOB:  May 23, 1951, LOS: 2 ADMISSION DATE:  09/06/2023, CONSULTATION DATE:  1/19 REFERRING MD:  ED, CHIEF COMPLAINT:  SOB/hypotension   History of Present Illness:  Casey Rangel is a 73 yo male with HTN, HLD, previous L MCA stroke with residal aphasia, dysphagia with PEG tube dependence, and vascular dementia who presents from [facility] due to respiratory distress and hypotension. Per staff, the patient became short of breath all of a sudden. At his baseline, he is alert, but confused. His O2 sat was in the 70s on NRB and EMS gave 500 cc fluid due to initial palpated pressure of 68. He was intubated in the ED.   Pertinent  Medical History  HTN Vascular dementia Previous CVA HLD Osteoporosis Depression  Significant Hospital Events: Including procedures, antibiotic start and stop dates in addition to other pertinent events   1/19 intubated in ED, admit to ICU, started on levophed  Interim History / Subjective:  Patient continue to require low-dose vasopressor support, currently on 5 mics of Levophed Remain afebrile Tolerating spontaneous breathing trial  Remain confused and restless  Objective   Blood pressure 136/70, pulse 75, temperature 98.6 F (37 C), resp. rate 17, height 5\' 8"  (1.727 m), weight 69.6 kg, SpO2 97%.    Vent Mode: PSV;CPAP FiO2 (%):  [40 %] 40 % Set Rate:  [25 bmp] 25 bmp Vt Set:  [540 mL] 540 mL PEEP:  [5 cmH20-8 cmH20] 5 cmH20 Plateau Pressure:  [18 cmH20-22 cmH20] 18 cmH20   Intake/Output Summary (Last 24 hours) at 09/08/2023 0856 Last data filed at 09/08/2023 0865 Gross per 24 hour  Intake 4638.73 ml  Output 855 ml  Net 3783.73 ml   Filed Weights   09/06/23 1600 09/08/23 0500  Weight: 61 kg 69.6 kg    Examination: General: Crtitically ill-appearing elderly male, orally intubated HEENT: [place]/AT, eyes anicteric.  ETT and OGT in place Neuro: Awake, not following commands, agitated and  restless Chest: Coarse breath sounds, no wheezes or rhonchi Heart: Regular rate and rhythm, no murmurs or gallops Abdomen: Soft, nondistended, bowel sounds present Skin: No rash  Labs and images reviewed  Resolved Hospital Problem list   N/a  Assessment & Plan:  Acute respiratory failure with hypoxia and hypercapnia Severe sepsis with septic shock due to community-acquired pneumonia Continue lung protective ventilation VAP prevention bundle in place Hypercapnia has cleared Patient is tolerating spontaneous breathing trial, will try to extubate him He is off sedation Continue IV antibiotics with ceftriaxone and azithromycin Continue to titrate vasopressor support with MAP goal 65 Received 2 L of IV fluid yesterday  Acute septic encephalopathy Vascular dementia He is off sedation CT head showed chronic changes, with slightly increased chronic bilateral subdural hemorrhage, nothing acute Restarted back on donepezil Started on Seroquel Continue Depakote as mood stabilizer that he takes at home  Lactic acidosis Slowly improving Closely monitor  Hypovolemic hypernatremia Acute kidney injury due to septic ATN Patient presented with serum sodium of 163 Serum sodium is trending down, currently at 144 Continue free water flushes Patient baseline serum creatinine is around 0.7-0.8, he presented with serum 1.2, now serum creatinine is trending down Monitor intake and output Avoid nephrotoxic agents  Shock liver LFTs are improving Avoid hepatotoxic agent  HTN Hold Coreg, in the setting of shock  Chronic dysphagia with PEG tube dependence Severe protein calorie malnutrition Continue tube feeds  Best Practice (right click and "Reselect all SmartList Selections" daily)   Diet/type:  NPO tube feed DVT prophylaxis LMWH Pressure ulcer(s): pressure ulcer assessment deferred  GI prophylaxis: PPI Lines: N/A Foley:  Yes, and it is still needed Code Status:  DNR Last date of  multidisciplinary goals of care discussion Cristi Loron, updated 1/19]  Labs   CBC: Recent Labs  Lab 09/06/23 1249 09/06/23 1319 09/06/23 1339 09/06/23 1713 09/07/23 0411 09/07/23 0727 09/08/23 0636  WBC 23.7*  --   --   --   --  28.8* 26.9*  NEUTROABS 21.3*  --   --   --   --   --   --   HGB 13.1   < > 11.9* 9.9* 9.2* 10.3* 8.4*  HCT 42.1   < > 35.0* 29.0* 27.0* 33.3* 26.6*  MCV 95.2  --   --   --   --  96.0 93.0  PLT 448*  --   --   --   --  304 324   < > = values in this interval not displayed.    Basic Metabolic Panel: Recent Labs  Lab 09/06/23 1249 09/06/23 1319 09/06/23 1614 09/06/23 1713 09/07/23 0411 09/07/23 0727 09/08/23 0636  NA 160*   < > 156* 159* 156* 151* 144  K 5.1   < > 5.1 4.0 3.9 4.3 4.0  CL 126*  --  125*  --   --  116* 115*  CO2 23  --  14*  --   --  22 22  GLUCOSE 164*  --  111*  --   --  85 154*  BUN 62*  --  63*  --   --  51* 41*  CREATININE 1.21  --  1.09  --   --  1.11 0.87  CALCIUM 8.0*  --  7.2*  --   --  7.5* 7.6*  MG  --   --   --   --   --  2.5* 2.5*  PHOS  --   --   --   --   --  4.0 2.8   < > = values in this interval not displayed.   GFR: Estimated Creatinine Clearance: 74.3 mL/min (by C-G formula based on SCr of 0.87 mg/dL). Recent Labs  Lab 09/06/23 1249 09/06/23 1320 09/07/23 0106 09/07/23 0727 09/08/23 0636  WBC 23.7*  --   --  28.8* 26.9*  LATICACIDVEN  --  2.5* 4.0* 3.5*  --     Liver Function Tests: Recent Labs  Lab 09/06/23 1249 09/07/23 0727  AST 331* 144*  ALT 353* 227*  ALKPHOS 110 92  BILITOT 1.1 1.4*  PROT 7.2 5.6*  ALBUMIN 2.4* 1.8*   No results for input(s): "LIPASE", "AMYLASE" in the last 168 hours. No results for input(s): "AMMONIA" in the last 168 hours.  ABG    Component Value Date/Time   PHART 7.404 09/07/2023 0411   PCO2ART 35.0 09/07/2023 0411   PO2ART 61 (L) 09/07/2023 0411   HCO3 21.6 09/07/2023 0411   TCO2 23 09/07/2023 0411   ACIDBASEDEF 2.0 09/07/2023 0411   O2SAT 90  09/07/2023 0411     Coagulation Profile: Recent Labs  Lab 09/06/23 1249  INR 1.3*    Cardiac Enzymes: No results for input(s): "CKTOTAL", "CKMB", "CKMBINDEX", "TROPONINI" in the last 168 hours.  HbA1C: Hgb A1c MFr Bld  Date/Time Value Ref Range Status  04/16/2023 11:00 AM 5.2 4.8 - 5.6 % Final    Comment:    (NOTE) Pre diabetes:  5.7%-6.4%  Diabetes:              >6.4%  Glycemic control for   <7.0% adults with diabetes   01/16/2022 04:49 AM 5.1 4.8 - 5.6 % Final    Comment:    (NOTE) Pre diabetes:          5.7%-6.4%  Diabetes:              >6.4%  Glycemic control for   <7.0% adults with diabetes     CBG: Recent Labs  Lab 09/07/23 1523 09/07/23 1925 09/07/23 2320 09/08/23 0322 09/08/23 0809  GLUCAP 115* 139* 116* 137* 142*    The patient is critically ill due to septic shock/acute respiratory failure.  Critical care was necessary to treat or prevent imminent or life-threatening deterioration.  Critical care was time spent personally by me on the following activities: development of treatment plan with patient and/or surrogate as well as nursing, discussions with consultants, evaluation of patient's response to treatment, examination of patient, obtaining history from patient or surrogate, ordering and performing treatments and interventions, ordering and review of laboratory studies, ordering and review of radiographic studies, pulse oximetry, re-evaluation of patient's condition and participation in multidisciplinary rounds.   During this encounter critical care time was devoted to patient care services described in this note for 35 minutes.     Cheri Fowler, MD [facility] See Amion for pager If no response to pager, please call [phone] until 7pm After 7pm, Please call E-link [phone]

## 2023-09-11 LAB — CULTURE, BLOOD (ROUTINE X 2)
Culture: NO GROWTH
Culture: NO GROWTH
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
## 2023-09-14 NOTE — Progress Notes (Addendum)
This RN walked in room and patient was sideways in the bed, with his head leaning on the side rail and feet hanging off the bed. Patient was not redirectable, resisting repositioning and was agitated and restless. Posey belt ordered by Dr. Merrily Pew and applied by this RN and nursing student at [phone]. Floor mats, safety mittens, and bed alarm continued for patient safety.   Darrold Span

## 2023-09-14 NOTE — Progress Notes (Addendum)
This RN expressed concern for patient's airway clearance with increased demand for oxygenation to Dr. Merrily Pew. Nasotracheal suctioning ordered and performed with RT, Megan, at the bedside. Patient is currently DNR with intubation.  Dr. Merrily Pew spoke with patient's sister on the phone. Comfort care orders placed with code status change to full DNR with no intervention. Restraints removed and Morphine drip started.  Darrold Span

## 2023-09-19 NOTE — Death Summary Note (Signed)
DEATH SUMMARY   Patient Details  Name: Casey Rangel MRN: [id number] DOB: 1951/06/08  Admission/Discharge Information   Admit Date:  September 18, 2023  Date of Death: Date of Death: 2023-09-21  Time of Death: Time of Death: 0155  Length of Stay: 3  Referring Physician: Mort Sawyers, FNP   Reason(s) for Hospitalization  Acute respiratory failure with hypoxia and hypercapnia Severe sepsis with septic shock due to community-acquired pneumonia Acute septic encephalopathy Vascular dementia  Lactic acidosis Hypovolemic hypernatremia Acute kidney injury due to septic ATN Shock liver HTN Chronic dysphagia with PEG tube dependence Severe protein calorie malnutrition  Diagnoses  Preliminary cause of death: Severe sepsis septic shock associated with acute respiratory failure with hypoxia and hypercapnia, POA Secondary Diagnoses (including complications and co-morbidities):  Principal Problem:   Acute hypoxic respiratory failure (HCC) Active Problems:   Protein-calorie malnutrition, severe   Septic shock [facility])   Brief Hospital Course (including significant findings, care, treatment, and services provided and events leading to death)  Casey Rangel is a 73 y.o. year old male with HTN, HLD, previous L MCA stroke with residal aphasia, dysphagia with PEG tube dependence, and vascular dementia who presents from [facility] due to respiratory distress and hypotension. Per staff, the patient became short of breath all of a sudden. At his baseline, he is alert, but confused. His O2 sat was in the 70s on NRB and EMS gave 500 cc fluid due to initial palpated pressure of 68. He was intubated Patient was admitted to ICU, he was continued on vasopressor support and broad-spectrum antibiotics Vent setting was adjusted to clear hypercapnia, he remained on mechanical ventilator.  At baseline patient is not interactive due to vascular dementia here he was agitated and restless, his serum creatinine  started improving.  Initially was hyponatremic, was given IV fluid therapy with that his serum sodium improved.  Patient was extubated on 09/08/2023.  Goals of care discussions were carried with patient's sister considering patient is bedbound, minimally interactive and with recurrent aspiration risk patient's family decided to proceed with comfort care and palliative approach.  Patient was started on comfort care and he passed on 09/21/2023 at 1:55 AM.  Patient's sister was called and left voicemail    Pertinent Labs and Studies  Significant Diagnostic Studies CT Head Wo Contrast Result Date: Sep 18, 2023 CLINICAL DATA:  Altered level of consciousness EXAM: CT HEAD WITHOUT CONTRAST TECHNIQUE: Contiguous axial images were obtained from the base of the skull through the vertex without intravenous contrast. RADIATION DOSE REDUCTION: This exam was performed according to the departmental dose-optimization program which includes automated exposure control, adjustment of the mA and/or kV according to patient size and/or use of iterative reconstruction technique. 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 No areas of high attenuation to suggest acute hemorrhage. There are stable chronic ischemic changes throughout the basal ganglia, thalami, periventricular white matter, bilateral frontal lobes, right occipital lobe, and left parietal lobe. No evidence of acute infarct. The lateral ventricles and remaining midline structures are unremarkable. No mass effect. Vascular: Stable atherosclerosis.  No hyperdense vessel. Skull: Normal. Negative for fracture or focal lesion. Sinuses/Orbits: Polypoid mucosal thickening left maxillary sinus. Remaining paranasal sinuses are clear. Other: None. IMPRESSION:  1. Enlarging bilateral chronic subdural hematomas or hygromas, measuring  up to 6 mm on the right and 7 mm on the left. No mass effect or evidence of acute hemorrhage. 2. No evidence of acute infarct. Stable extensive chronic ischemic changes as above. These results were called by telephone at the time of interpretation on 09/06/2023 at 3:43 pm to provider DR Denese Killings, who verbally acknowledged these results. Electronically Signed   By: Sharlet Salina M.D.   On: 09/06/2023 15:47   DG Chest Portable 1 View Result Date: 09/06/2023 CLINICAL DATA:  Respiratory distress with hypotension. Some shortness-of-breath. EXAM: PORTABLE CHEST 1 VIEW COMPARISON:  06/09/2023 FINDINGS: Endotracheal tube has tip 6 cm above the carina. Enteric tube courses into the region of the stomach with tip over the stomach just left of midline. Lungs are adequately inflated with minimal hazy prominence of the perihilar markings right worse than left likely mild asymmetric vascular congestion although infection is possible. No lobar consolidation or effusion. No pneumothorax. Cardiomediastinal silhouette and remainder of the exam is unchanged. IMPRESSION: 1. Minimal hazy prominence of the perihilar markings right worse than left likely mild asymmetric vascular congestion, although infection is possible. 2. Endotracheal tube with tip 6 cm above the carina. Enteric tube with tip over the stomach just left of midline. Electronically Signed   By: Elberta Fortis M.D.   On: 09/06/2023 13:27   DG ABDOMEN PEG TUBE LOCATION Result Date: 08/18/2023 CLINICAL DATA:  Peg tube malfunction EXAM: ABDOMEN - 1 VIEW COMPARISON:  05/20/2023 FINDINGS: Frontal view of the lower chest and upper abdomen demonstrates percutaneous gastrostomy tube within the gastric antrum. Injected contrast is seen outlining the gastric rugal folds and proximal small bowel. No evidence of contrast extravasation. IMPRESSION: 1. Percutaneous gastrostomy tube within the lumen of the  gastric antrum. No contrast extravasation. Electronically Signed   By: Sharlet Salina M.D.   On: 08/18/2023 18:47   CT HEAD WO CONTRAST ( ) Result Date: 08/15/2023 CLINICAL DATA:  Subdural hematoma EXAM: CT HEAD WITHOUT CONTRAST TECHNIQUE: Contiguous axial images were obtained from the base of the skull through the vertex without intravenous contrast. RADIATION DOSE REDUCTION: This exam was performed according to the departmental dose-optimization program which includes automated exposure control, adjustment of the mA and/or kV according to patient size and/or use of iterative reconstruction technique. COMPARISON:  06/09/2023 FINDINGS: Brain: Interval decrease in the density of previously noted bilateral subdural hematomas, which are similar in size to the prior exam, measuring up to 4 mm on the right and 3 mm on the left. No evidence of acute hemorrhage within the hematomas. No significant mass effect or midline shift. No evidence of acute infarct, parenchymal hemorrhage, mass, or hydrocephalus. Redemonstrated chronic infarcts in the right bilateral frontal lobes, left parietal lobe, and right occipital lobe, with additional lacunar infarcts in the right thalamus and left basal ganglia. Vascular: No hyperdense vessel. Atherosclerotic calcifications in the intracranial carotid and vertebral arteries. Skull: Negative for fracture or focal lesion. Sinuses/Orbits: Mucosal thickening in the maxillary sinuses and ethmoid air cells. No acute finding in the orbits. Other: Trace fluid in right mastoid air cells. IMPRESSION: 1. Redemonstrated chronic bilateral subdural hematomas, which are similar in size to the prior exam. No evidence of acute hemorrhage within the hematomas. No significant mass effect. 2. No acute intracranial process. Electronically Signed   By: Wiliam Ke M.D.   On: 08/15/2023 22:56    Microbiology Recent Results (from the past 240 hours)  Culture, blood (Routine x 2)     Status: None  (Preliminary result)  Collection Time: 09/06/23 12:47 PM   Specimen: BLOOD  Result Value Ref Range Status   Specimen Description BLOOD LEFT ANTECUBITAL  Final   Special Requests   Final    BOTTLES DRAWN AEROBIC AND ANAEROBIC Blood Culture results may not be optimal due to an inadequate volume of blood received in culture bottles   Culture   Final    NO GROWTH 3 DAYS Performed at [facility], [address] [place], [place] [address]    Report Status PENDING  Incomplete  Resp panel by RT-PCR (RSV, Flu A&B, Covid) Anterior Nasal Swab     Status: None   Collection Time: 09/06/23  1:17 PM   Specimen: Anterior Nasal Swab  Result Value Ref Range Status   SARS Coronavirus 2 by RT PCR NEGATIVE NEGATIVE Final   Influenza A by PCR NEGATIVE NEGATIVE Final   Influenza B by PCR NEGATIVE NEGATIVE Final    Comment: (NOTE) The Xpert Xpress SARS-CoV-2/FLU/RSV plus assay is intended as an aid in the diagnosis of influenza from Nasopharyngeal swab specimens and should not be used as a sole basis for treatment. Nasal washings and aspirates are unacceptable for Xpert Xpress SARS-CoV-2/FLU/RSV testing.  Fact Sheet for Patients: BloggerCourse.com  Fact Sheet for Healthcare Providers: SeriousBroker.it  This test is not yet approved or cleared by the [place] FDA and has been authorized for detection and/or diagnosis of SARS-CoV-2 by FDA under an Emergency Use Authorization (EUA). This EUA will remain in effect (meaning this test can be used) for the duration of the COVID-19 declaration under Section 564(b)(1) of the Act, 21 U.S.C. section 360bbb-3(b)(1), unless the authorization is terminated or revoked.     Resp Syncytial Virus by PCR NEGATIVE NEGATIVE Final    Comment: (NOTE) Fact Sheet for Patients: BloggerCourse.com  Fact Sheet for Healthcare Providers: SeriousBroker.it  This  test is not yet approved or cleared by the [place] FDA and has been authorized for detection and/or diagnosis of SARS-CoV-2 by FDA under an Emergency Use Authorization (EUA). This EUA will remain in effect (meaning this test can be used) for the duration of the COVID-19 declaration under Section 564(b)(1) of the Act, 21 U.S.C. section 360bbb-3(b)(1), unless the authorization is terminated or revoked.  Performed at [facility], [address], [place], [place] [address]   MRSA Next Gen by PCR, Nasal     Status: Abnormal   Collection Time: 09/06/23  4:14 PM   Specimen: Nasal Mucosa; Nasal Swab  Result Value Ref Range Status   MRSA by PCR Next Gen DETECTED (A) NOT DETECTED Final    Comment: (NOTE) The GeneXpert MRSA Assay (FDA approved for NASAL specimens only), is one component of a comprehensive MRSA colonization surveillance program. It is not intended to diagnose MRSA infection nor to guide or monitor treatment for MRSA infections. Test performance is not FDA approved in patients less than 34 years old. Performed at [facility], [address] [place], [place] [address]   Culture, blood (Routine x 2)     Status: None (Preliminary result)   Collection Time: 09/06/23  5:20 PM   Specimen: BLOOD  Result Value Ref Range Status   Specimen Description BLOOD BLOOD RIGHT ARM  Final   Special Requests   Final    BOTTLES DRAWN AEROBIC AND ANAEROBIC Blood Culture results may not be optimal due to an inadequate volume of blood received in culture bottles   Culture   Final    NO GROWTH  3 DAYS Performed at [facility], [address], [place], [place] [address]    Report Status PENDING  Incomplete    Lab Basic Metabolic Panel: Recent Labs  Lab 09/06/23 1249 09/06/23 1319 09/06/23 1614 09/06/23 1713 09/07/23 0411 09/07/23 0727 09/08/23 0636  NA 160*   < > 156* 159* 156* 151* 144  K 5.1   < > 5.1 4.0 3.9 4.3 4.0  CL 126*  --  125*  --   --  116* 115*   CO2 23  --  14*  --   --  22 22  GLUCOSE 164*  --  111*  --   --  85 154*  BUN 62*  --  63*  --   --  51* 41*  CREATININE 1.21  --  1.09  --   --  1.11 0.87  CALCIUM 8.0*  --  7.2*  --   --  7.5* 7.6*  MG  --   --   --   --   --  2.5* 2.5*  PHOS  --   --   --   --   --  4.0 2.8   < > = values in this interval not displayed.   Liver Function Tests: Recent Labs  Lab 09/06/23 1249 09/07/23 0727  AST 331* 144*  ALT 353* 227*  ALKPHOS 110 92  BILITOT 1.1 1.4*  PROT 7.2 5.6*  ALBUMIN 2.4* 1.8*   No results for input(s): "LIPASE", "AMYLASE" in the last 168 hours. No results for input(s): "AMMONIA" in the last 168 hours. CBC: Recent Labs  Lab 09/06/23 1249 09/06/23 1319 09/06/23 1339 09/06/23 1713 09/07/23 0411 09/07/23 0727 09/08/23 0636  WBC 23.7*  --   --   --   --  28.8* 26.9*  NEUTROABS 21.3*  --   --   --   --   --   --   HGB 13.1   < > 11.9* 9.9* 9.2* 10.3* 8.4*  HCT 42.1   < > 35.0* 29.0* 27.0* 33.3* 26.6*  MCV 95.2  --   --   --   --  96.0 93.0  PLT 448*  --   --   --   --  304 324   < > = values in this interval not displayed.   Cardiac Enzymes: No results for input(s): "CKTOTAL", "CKMB", "CKMBINDEX", "TROPONINI" in the last 168 hours. Sepsis Labs: Recent Labs  Lab 09/06/23 1249 09/06/23 1320 09/07/23 0106 09/07/23 0727 09/08/23 0636  WBC 23.7*  --   --  28.8* 26.9*  LATICACIDVEN  --  2.5* 4.0* 3.5*  --     Procedures/Operations     [employer] 09/18/2023, 10:57 AM

## 2023-09-19 NOTE — Progress Notes (Signed)
Time of death 85. This nurse and Wynona Luna, RN pronounced. No respirations noted. Morphine drip wasted with Brianna in medicine room. Jerel Shepherd, RN notified. Patient sister Verdia Kuba called at [phone] but called was directed to voicemail. Left phone number to call unit back at [phone]

## 2023-09-19 NOTE — Accreditation Note (Signed)
Restraint death in 3 point soft restraints bilateral wrist and soft roll belt  within 24 hours of removal reported to CMS 09/11/2023 at 0857 by Laurene Footman RN

## 2023-09-19 NOTE — Progress Notes (Signed)
Pt has passed away.  

## 2023-09-19 DEATH — deceased
